# Patient Record
Sex: Female | Born: 1945 | Race: White | Hispanic: No | Marital: Single | Smoking: Never smoker
Health system: Southern US, Community
[De-identification: ages and names within clinical notes are randomized; demographics above are authoritative.]

## PROBLEM LIST (undated history)

## (undated) ENCOUNTER — Emergency Department (HOSPITAL_BASED_OUTPATIENT_CLINIC_OR_DEPARTMENT_OTHER): Payer: PRIVATE HEALTH INSURANCE | Source: Home / Self Care

## (undated) DIAGNOSIS — R569 Unspecified convulsions: Secondary | ICD-10-CM

## (undated) DIAGNOSIS — H9192 Unspecified hearing loss, left ear: Secondary | ICD-10-CM

## (undated) DIAGNOSIS — I10 Essential (primary) hypertension: Secondary | ICD-10-CM

## (undated) DIAGNOSIS — Z87442 Personal history of urinary calculi: Secondary | ICD-10-CM

## (undated) DIAGNOSIS — I809 Phlebitis and thrombophlebitis of unspecified site: Secondary | ICD-10-CM

## (undated) DIAGNOSIS — F329 Major depressive disorder, single episode, unspecified: Secondary | ICD-10-CM

## (undated) DIAGNOSIS — F32A Depression, unspecified: Secondary | ICD-10-CM

## (undated) DIAGNOSIS — E7800 Pure hypercholesterolemia, unspecified: Secondary | ICD-10-CM

## (undated) DIAGNOSIS — N200 Calculus of kidney: Secondary | ICD-10-CM

## (undated) DIAGNOSIS — F419 Anxiety disorder, unspecified: Secondary | ICD-10-CM

## (undated) DIAGNOSIS — M1990 Unspecified osteoarthritis, unspecified site: Secondary | ICD-10-CM

## (undated) DIAGNOSIS — R011 Cardiac murmur, unspecified: Secondary | ICD-10-CM

## (undated) DIAGNOSIS — K219 Gastro-esophageal reflux disease without esophagitis: Secondary | ICD-10-CM

## (undated) DIAGNOSIS — I509 Heart failure, unspecified: Secondary | ICD-10-CM

## (undated) HISTORY — PX: APPENDECTOMY: SHX54

## (undated) HISTORY — PX: COLONOSCOPY: SHX174

## (undated) HISTORY — PX: EYE SURGERY: SHX253

## (undated) HISTORY — PX: OTHER SURGICAL HISTORY: SHX169

## (undated) HISTORY — PX: CHOLECYSTECTOMY: SHX55

## (undated) HISTORY — PX: CARPAL TUNNEL RELEASE: SHX101

## (undated) HISTORY — PX: BREAST BIOPSY: SHX20

## (undated) HISTORY — PX: ESOPHAGOSCOPY: SUR460

## (undated) HISTORY — PX: INCISION AND DRAINAGE PERIRECTAL ABSCESS: SHX1804

## (undated) HISTORY — PX: ABDOMINAL HYSTERECTOMY: SHX81

---

## 2010-06-19 ENCOUNTER — Emergency Department (HOSPITAL_COMMUNITY)
Admission: EM | Admit: 2010-06-19 | Discharge: 2010-06-19 | Payer: Self-pay | Source: Home / Self Care | Admitting: Family Medicine

## 2010-07-01 ENCOUNTER — Emergency Department (HOSPITAL_COMMUNITY)
Admission: EM | Admit: 2010-07-01 | Discharge: 2010-07-01 | Payer: Self-pay | Source: Home / Self Care | Admitting: Family Medicine

## 2010-07-02 ENCOUNTER — Ambulatory Visit
Admission: RE | Admit: 2010-07-02 | Discharge: 2010-07-02 | Payer: Self-pay | Source: Home / Self Care | Attending: Emergency Medicine | Admitting: Emergency Medicine

## 2010-07-02 ENCOUNTER — Encounter (INDEPENDENT_AMBULATORY_CARE_PROVIDER_SITE_OTHER): Payer: Self-pay | Admitting: Emergency Medicine

## 2010-07-02 ENCOUNTER — Ambulatory Visit (HOSPITAL_COMMUNITY)
Admission: RE | Admit: 2010-07-02 | Discharge: 2010-07-02 | Payer: Self-pay | Source: Home / Self Care | Attending: Emergency Medicine | Admitting: Emergency Medicine

## 2010-09-23 LAB — POCT I-STAT, CHEM 8
BUN: 42 mg/dL — ABNORMAL HIGH (ref 6–23)
Creatinine, Ser: 1.7 mg/dL — ABNORMAL HIGH (ref 0.4–1.2)
Glucose, Bld: 189 mg/dL — ABNORMAL HIGH (ref 70–99)
Hemoglobin: 10.9 g/dL — ABNORMAL LOW (ref 12.0–15.0)
TCO2: 28 mmol/L (ref 0–100)

## 2010-09-23 LAB — CBC
HCT: 32.3 % — ABNORMAL LOW (ref 36.0–46.0)
Hemoglobin: 10.4 g/dL — ABNORMAL LOW (ref 12.0–15.0)
MCH: 29.3 pg (ref 26.0–34.0)
MCHC: 32.2 g/dL (ref 30.0–36.0)
MCV: 91 fL (ref 78.0–100.0)
Platelets: 305 10*3/uL (ref 150–400)
RBC: 3.55 MIL/uL — ABNORMAL LOW (ref 3.87–5.11)
RDW: 13.4 % (ref 11.5–15.5)
WBC: 13.3 10*3/uL — ABNORMAL HIGH (ref 4.0–10.5)

## 2010-09-24 LAB — POCT URINALYSIS DIPSTICK
Bilirubin Urine: NEGATIVE
Hgb urine dipstick: NEGATIVE
Nitrite: NEGATIVE
Protein, ur: NEGATIVE mg/dL
pH: 5 (ref 5.0–8.0)

## 2010-11-13 ENCOUNTER — Other Ambulatory Visit (HOSPITAL_COMMUNITY): Payer: Self-pay | Admitting: Gastroenterology

## 2010-11-13 DIAGNOSIS — R112 Nausea with vomiting, unspecified: Secondary | ICD-10-CM

## 2010-11-13 DIAGNOSIS — K3184 Gastroparesis: Secondary | ICD-10-CM

## 2010-12-13 ENCOUNTER — Ambulatory Visit (HOSPITAL_COMMUNITY)
Admission: RE | Admit: 2010-12-13 | Discharge: 2010-12-13 | Disposition: A | Discharge status: Home / Self Care | Payer: PRIVATE HEALTH INSURANCE | Source: Ambulatory Visit | Attending: Gastroenterology | Admitting: Gastroenterology

## 2010-12-13 ENCOUNTER — Encounter (HOSPITAL_COMMUNITY): Payer: Self-pay

## 2010-12-13 DIAGNOSIS — K3184 Gastroparesis: Secondary | ICD-10-CM | POA: Insufficient documentation

## 2010-12-13 DIAGNOSIS — R112 Nausea with vomiting, unspecified: Secondary | ICD-10-CM | POA: Insufficient documentation

## 2010-12-13 DIAGNOSIS — K3189 Other diseases of stomach and duodenum: Secondary | ICD-10-CM | POA: Insufficient documentation

## 2010-12-13 DIAGNOSIS — E119 Type 2 diabetes mellitus without complications: Secondary | ICD-10-CM | POA: Insufficient documentation

## 2010-12-13 MED ORDER — TECHNETIUM TC 99M SULFUR COLLOID
2.0000 | Freq: Once | INTRAVENOUS | Status: AC | PRN
  Administered 2010-12-13: 2 via INTRAVENOUS

## 2011-01-01 ENCOUNTER — Inpatient Hospital Stay (HOSPITAL_COMMUNITY)
Admission: EM | Admit: 2011-01-01 | Discharge: 2011-01-10 | DRG: 579 | Disposition: A | Discharge status: Home Health | Payer: PRIVATE HEALTH INSURANCE | Attending: Internal Medicine | Admitting: Internal Medicine

## 2011-01-01 DIAGNOSIS — Z7982 Long term (current) use of aspirin: Secondary | ICD-10-CM

## 2011-01-01 DIAGNOSIS — Z6841 Body Mass Index (BMI) 40.0 and over, adult: Secondary | ICD-10-CM

## 2011-01-01 DIAGNOSIS — F3289 Other specified depressive episodes: Secondary | ICD-10-CM | POA: Diagnosis present

## 2011-01-01 DIAGNOSIS — M76899 Other specified enthesopathies of unspecified lower limb, excluding foot: Secondary | ICD-10-CM | POA: Diagnosis present

## 2011-01-01 DIAGNOSIS — Z79899 Other long term (current) drug therapy: Secondary | ICD-10-CM

## 2011-01-01 DIAGNOSIS — J690 Pneumonitis due to inhalation of food and vomit: Secondary | ICD-10-CM | POA: Diagnosis not present

## 2011-01-01 DIAGNOSIS — N179 Acute kidney failure, unspecified: Secondary | ICD-10-CM | POA: Diagnosis not present

## 2011-01-01 DIAGNOSIS — J9600 Acute respiratory failure, unspecified whether with hypoxia or hypercapnia: Secondary | ICD-10-CM | POA: Diagnosis not present

## 2011-01-01 DIAGNOSIS — G4733 Obstructive sleep apnea (adult) (pediatric): Secondary | ICD-10-CM | POA: Diagnosis present

## 2011-01-01 DIAGNOSIS — Z794 Long term (current) use of insulin: Secondary | ICD-10-CM

## 2011-01-01 DIAGNOSIS — L02419 Cutaneous abscess of limb, unspecified: Principal | ICD-10-CM | POA: Diagnosis present

## 2011-01-01 DIAGNOSIS — E662 Morbid (severe) obesity with alveolar hypoventilation: Secondary | ICD-10-CM | POA: Diagnosis present

## 2011-01-01 DIAGNOSIS — F329 Major depressive disorder, single episode, unspecified: Secondary | ICD-10-CM | POA: Diagnosis present

## 2011-01-01 DIAGNOSIS — R609 Edema, unspecified: Secondary | ICD-10-CM | POA: Diagnosis present

## 2011-01-01 DIAGNOSIS — IMO0001 Reserved for inherently not codable concepts without codable children: Secondary | ICD-10-CM | POA: Diagnosis not present

## 2011-01-01 DIAGNOSIS — E785 Hyperlipidemia, unspecified: Secondary | ICD-10-CM | POA: Diagnosis present

## 2011-01-01 DIAGNOSIS — I10 Essential (primary) hypertension: Secondary | ICD-10-CM | POA: Diagnosis present

## 2011-01-01 LAB — CBC
HCT: 30.9 % — ABNORMAL LOW (ref 36.0–46.0)
MCHC: 32.4 g/dL (ref 30.0–36.0)
MCV: 88.3 fL (ref 78.0–100.0)
Platelets: 319 10*3/uL (ref 150–400)
RDW: 13.3 % (ref 11.5–15.5)
WBC: 12.6 10*3/uL — ABNORMAL HIGH (ref 4.0–10.5)

## 2011-01-01 LAB — DIFFERENTIAL
Basophils Absolute: 0 10*3/uL (ref 0.0–0.1)
Eosinophils Absolute: 0.5 10*3/uL (ref 0.0–0.7)
Eosinophils Relative: 4 % (ref 0–5)
Lymphocytes Relative: 21 % (ref 12–46)
Lymphs Abs: 2.6 10*3/uL (ref 0.7–4.0)
Monocytes Absolute: 0.8 10*3/uL (ref 0.1–1.0)

## 2011-01-01 LAB — BASIC METABOLIC PANEL
BUN: 22 mg/dL (ref 6–23)
Creatinine, Ser: 0.7 mg/dL (ref 0.50–1.10)
GFR calc Af Amer: >60 mL/min (ref >60)
GFR calc non Af Amer: >60 mL/min (ref >60)

## 2011-01-01 LAB — GLUCOSE, CAPILLARY: Glucose-Capillary: 162 mg/dL — ABNORMAL HIGH (ref 70–99)

## 2011-01-02 ENCOUNTER — Inpatient Hospital Stay (HOSPITAL_COMMUNITY): Payer: PRIVATE HEALTH INSURANCE

## 2011-01-02 LAB — GLUCOSE, CAPILLARY
Glucose-Capillary: 213 mg/dL — ABNORMAL HIGH (ref 70–99)
Glucose-Capillary: 208 mg/dL — ABNORMAL HIGH (ref 70–99)

## 2011-01-02 MED ORDER — IOHEXOL 300 MG/ML  SOLN
100.0000 mL | Freq: Once | INTRAMUSCULAR | Status: AC | PRN
  Administered 2011-01-02: 100 mL via INTRAVENOUS

## 2011-01-03 ENCOUNTER — Inpatient Hospital Stay (HOSPITAL_COMMUNITY): Payer: PRIVATE HEALTH INSURANCE

## 2011-01-03 DIAGNOSIS — M79609 Pain in unspecified limb: Secondary | ICD-10-CM

## 2011-01-03 LAB — GLUCOSE, CAPILLARY
Glucose-Capillary: 132 mg/dL — ABNORMAL HIGH (ref 70–99)
Glucose-Capillary: 155 mg/dL — ABNORMAL HIGH (ref 70–99)
Glucose-Capillary: 97 mg/dL (ref 70–99)
Glucose-Capillary: 124 mg/dL — ABNORMAL HIGH (ref 70–99)

## 2011-01-03 LAB — CBC
HCT: 28.6 % — ABNORMAL LOW (ref 36.0–46.0)
MCH: 28.6 pg (ref 26.0–34.0)
MCHC: 32.2 g/dL (ref 30.0–36.0)
MCV: 88.8 fL (ref 78.0–100.0)
RDW: 13.4 % (ref 11.5–15.5)

## 2011-01-03 LAB — SURGICAL PCR SCREEN
MRSA, PCR: NEGATIVE
Staphylococcus aureus: NEGATIVE

## 2011-01-03 LAB — BASIC METABOLIC PANEL
BUN: 21 mg/dL (ref 6–23)
Calcium: 8.6 mg/dL (ref 8.4–10.5)
Chloride: 100 mEq/L (ref 96–112)
Creatinine, Ser: 1.06 mg/dL (ref 0.50–1.10)
GFR calc Af Amer: >60 mL/min (ref >60)
GFR calc non Af Amer: 52 mL/min — ABNORMAL LOW (ref >60)

## 2011-01-03 LAB — GRAM STAIN

## 2011-01-04 LAB — GLUCOSE, CAPILLARY
Glucose-Capillary: 111 mg/dL — ABNORMAL HIGH (ref 70–99)
Glucose-Capillary: 129 mg/dL — ABNORMAL HIGH (ref 70–99)
Glucose-Capillary: 129 mg/dL — ABNORMAL HIGH (ref 70–99)
Glucose-Capillary: 127 mg/dL — ABNORMAL HIGH (ref 70–99)

## 2011-01-04 LAB — CBC
Platelets: 302 10*3/uL (ref 150–400)
RBC: 2.91 MIL/uL — ABNORMAL LOW (ref 3.87–5.11)
RDW: 13.6 % (ref 11.5–15.5)
WBC: 13.8 10*3/uL — ABNORMAL HIGH (ref 4.0–10.5)

## 2011-01-04 LAB — BASIC METABOLIC PANEL
CO2: 27 mEq/L (ref 19–32)
Chloride: 100 mEq/L (ref 96–112)
GFR calc Af Amer: 39 mL/min — ABNORMAL LOW (ref >60)
Potassium: 4.3 mEq/L (ref 3.5–5.1)
Sodium: 138 mEq/L (ref 135–145)

## 2011-01-05 ENCOUNTER — Inpatient Hospital Stay (HOSPITAL_COMMUNITY): Payer: PRIVATE HEALTH INSURANCE

## 2011-01-05 LAB — CBC
HCT: 27.1 % — ABNORMAL LOW (ref 36.0–46.0)
MCV: 90.3 fL (ref 78.0–100.0)
Platelets: 279 10*3/uL (ref 150–400)
RBC: 3 MIL/uL — ABNORMAL LOW (ref 3.87–5.11)
RDW: 14 % (ref 11.5–15.5)
WBC: 9.6 10*3/uL (ref 4.0–10.5)

## 2011-01-05 LAB — CREATININE, URINE, RANDOM: Creatinine, Urine: 77.71 mg/dL

## 2011-01-05 LAB — GLUCOSE, CAPILLARY
Glucose-Capillary: 120 mg/dL — ABNORMAL HIGH (ref 70–99)
Glucose-Capillary: 124 mg/dL — ABNORMAL HIGH (ref 70–99)

## 2011-01-05 LAB — BASIC METABOLIC PANEL
CO2: 25 mEq/L (ref 19–32)
Chloride: 100 mEq/L (ref 96–112)
Creatinine, Ser: 2.54 mg/dL — ABNORMAL HIGH (ref 0.50–1.10)
GFR calc Af Amer: 23 mL/min — ABNORMAL LOW (ref >60)
Potassium: 4.6 mEq/L (ref 3.5–5.1)

## 2011-01-06 DIAGNOSIS — L03119 Cellulitis of unspecified part of limb: Secondary | ICD-10-CM

## 2011-01-06 DIAGNOSIS — L02419 Cutaneous abscess of limb, unspecified: Secondary | ICD-10-CM

## 2011-01-06 LAB — CBC
Hemoglobin: 8.4 g/dL — ABNORMAL LOW (ref 12.0–15.0)
MCHC: 31.2 g/dL (ref 30.0–36.0)
RBC: 2.99 MIL/uL — ABNORMAL LOW (ref 3.87–5.11)
WBC: 10.1 10*3/uL (ref 4.0–10.5)

## 2011-01-06 LAB — BASIC METABOLIC PANEL
CO2: 22 mEq/L (ref 19–32)
Chloride: 103 mEq/L (ref 96–112)
GFR calc non Af Amer: 28 mL/min — ABNORMAL LOW (ref >60)
Glucose, Bld: 150 mg/dL — ABNORMAL HIGH (ref 70–99)
Potassium: 4.1 mEq/L (ref 3.5–5.1)
Sodium: 138 mEq/L (ref 135–145)

## 2011-01-06 LAB — GLUCOSE, CAPILLARY
Glucose-Capillary: 171 mg/dL — ABNORMAL HIGH (ref 70–99)
Glucose-Capillary: 159 mg/dL — ABNORMAL HIGH (ref 70–99)
Glucose-Capillary: 156 mg/dL — ABNORMAL HIGH (ref 70–99)

## 2011-01-06 LAB — BODY FLUID CULTURE

## 2011-01-06 NOTE — H&P (Signed)
NAMEMarland Kitchen  SACRED, ROA NO.:  [id number]  MEDICAL RECORD NO.:  [id number]  LOCATION:  [facility]                         FACILITY:  [facility]  PHYSICIAN:  Jeoffrey Massed, MD    DATE OF BIRTH:  1945-12-15  DATE OF ADMISSION:  01/01/2011 DATE OF DISCHARGE:                             HISTORY & PHYSICAL   PRIMARY CARE PRACTITIONER:  Gretta Arab. Valentina Lucks, MD  CHIEF COMPLAINT:  Left lower extremity pain and swelling.  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 Per the patient last Friday, her legs started to swell and over the weekend, it progressed to some mild erythema.  However, for the last few days the erythema has worsened and started to hurt.  She has some subjective fever as well, but there is no documented fever.  She then presented to the ED for further evaluation.  She was in the ED she was found to have cellulitis with a leukocytosis of 12.6 and was admitted to the hospitalist service for the evaluation and treatment.  The patient denies any chest pain or shortness of breath.  The patient denies any headache, nausea, vomiting, or diarrhea.  Denies any abdominal pain.  ALLERGIES:  The patient is allergic to LIPITOR and INDOMETHACIN.  PAST MEDICAL HISTORY: 1. Diabetes. 2. Hypertension. 3. Dyslipidemia. 4. Chronic left lower extremity swelling. 5. Depression. 6. Obesity.  PAST SURGICAL HISTORY: 1. Appendectomy. 2. Cholecystectomy. 3. Bilateral carpal tunnel surgeries.  MEDICATIONS AT HOME:  Include the following; 1. Dexilant 60 mg 1 tablet daily. 2. Lantus 50 units twice a day. 3. Norco 5/325 one tablet p.o. daily p.r.n. 4. Lisinopril/hydrochlorothiazide 20/12.5 one tablet p.o. daily. 5. Norvasc 5 mg 1 tablet daily. 6. NovoLog insulin sliding scale. 7. Phenergan 25 mg 1 tablet p.o. q.6  hours. p.r.n. 8. Lasix 40 mg 1 tablet daily. 9. Paroxetine 40 mg 1 tablet daily. 10.Simvastatin 40 mg 1 tablet daily. 11.Meloxicam 15 mg 1 tablet daily. 12.Calcium plus vitamin D 600-200 mg 1 tablet p.o. daily. 13.Centrum Silver daily. 14.Loratadine 10 mg 1 tablet daily. 15.Baby aspirin 81 mg 1 tablet daily. 16.Iron 325 mg 1 tablet twice daily. 17.Neurontin 300 mg 1 capsule three times a day. 18.Albuterol inhaler 2 puffs inhaled p.r.n. 19.Mucinex 30/600 mg p.o. daily. 20.Advil 400 mg p.o. daily p.r.n.  FAMILY HISTORY:  Noncontributory to current condition.  SOCIAL HISTORY:  Denies any toxic habits.  Lives with family.  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  REVIEW OF SYSTEMS:  Detailed review of 12 systems were done and these are negative except for the ones mentioned in the HPI.  PHYSICAL EXAM:  GENERAL:  Lying in bed, does not appear to be in distress.  Does not have a toxic appearance. VITAL SIGNS:  Shows a temperature of 97.9, heart rate of 84, blood pressure 131/66 and pulse ox of 93% on room air. HEENT:  Atraumatic, normocephalic.  Pupils are equally react light and accommodation. NECK:  Supple. CHEST:  Bilaterally clear to auscultation. CARDIOVASCULAR:  Heart sounds are regular.  No murmurs  heard. ABDOMEN:  Obese, soft, nontender, nondistended.  She has a cholecystectomy scar on the right upper quadrant region with some slight excoriation of the skin. EXTREMITIES:  Her left lower extremity is erythematous anteriorly and laterally.  It is tender to touch.  It is slightly more swollen than her right lower extremity.  Both of her legs are warm to touch.  She has good pedal pulses symmetrically and has good capillary refill.  Her sensation is grossly intact in the lower extremities. NEUROLOGIC:  The patient is awake and alert and does not have any focal neurological deficits.  LABORATORY DATA: 1. CBC showed a WBC of 12.6, hemoglobin of 10.0,  hematocrit of 30.9     and a platelet count of 319. 2. Chemistry shows sodium of 138, potassium of 4.2, chloride of 100,     bicarb of 31, BUN of 22, creatinine of 0.70 and a calcium 9.5.  RADIOLOGICAL STUDIES:  None so far.  ASSESSMENT: 1. Left lower extremity cellulitis. 2. Diabetes, currently controlled. 3. Hypertension, currently controlled. 4. History of dyslipidemia. 5. History morbid obesity.  PLAN: 1. This patient will be admitted to regular medical floor. 2. She works as an Charity fundraiser and as a result we will start on IV vancomycin. 3. The patient has been instructed to keep her left lower extremity     elevated at all times. 4. We will ask the RN to mark the cellulitic area. 5. Further plan will depend as the patient's clinical course evolves. 6. In regard to her diabetes, we will continue Lantus and we will put     on a sliding scale insulin. 7. Her usual blood pressure medications will be continued. 8. DVT prophylaxis with Lovenox. 9. Code status, full code.  Total time spent coordinating admission 60 minutes.     Jeoffrey Massed, MD     SG/MEDQ  D:  01/01/2011  T:  01/01/2011  Job:  [id number]  cc:   Gretta Arab. Valentina Lucks, M.D. Electronically Signed by Jeoffrey Massed  on 01/06/2011 04:33:36 PM

## 2011-01-07 ENCOUNTER — Inpatient Hospital Stay (HOSPITAL_COMMUNITY): Payer: PRIVATE HEALTH INSURANCE

## 2011-01-07 LAB — BASIC METABOLIC PANEL
BUN: 31 mg/dL — ABNORMAL HIGH (ref 6–23)
Creatinine, Ser: 1.11 mg/dL — ABNORMAL HIGH (ref 0.50–1.10)
GFR calc Af Amer: >60 mL/min (ref >60)
GFR calc non Af Amer: 50 mL/min — ABNORMAL LOW (ref >60)
Potassium: 3.9 mEq/L (ref 3.5–5.1)

## 2011-01-07 LAB — GLUCOSE, CAPILLARY
Glucose-Capillary: 176 mg/dL — ABNORMAL HIGH (ref 70–99)
Glucose-Capillary: 159 mg/dL — ABNORMAL HIGH (ref 70–99)

## 2011-01-07 LAB — CBC
HCT: 26.2 % — ABNORMAL LOW (ref 36.0–46.0)
MCHC: 31.3 g/dL (ref 30.0–36.0)
Platelets: 291 10*3/uL (ref 150–400)
RDW: 13.7 % (ref 11.5–15.5)

## 2011-01-07 MED ORDER — IOHEXOL 300 MG/ML  SOLN
100.0000 mL | Freq: Once | INTRAMUSCULAR | Status: AC | PRN
  Administered 2011-01-07: 100 mL via INTRAVENOUS

## 2011-01-08 LAB — CBC
HCT: 23.9 % — ABNORMAL LOW (ref 36.0–46.0)
MCV: 88.8 fL (ref 78.0–100.0)
Platelets: 296 10*3/uL (ref 150–400)
RBC: 2.69 MIL/uL — ABNORMAL LOW (ref 3.87–5.11)
WBC: 8.4 10*3/uL (ref 4.0–10.5)

## 2011-01-08 LAB — BASIC METABOLIC PANEL
BUN: 17 mg/dL (ref 6–23)
CO2: 26 mEq/L (ref 19–32)
Chloride: 105 mEq/L (ref 96–112)
Creatinine, Ser: 0.79 mg/dL (ref 0.50–1.10)

## 2011-01-08 LAB — DIFFERENTIAL
Lymphocytes Relative: 18 % (ref 12–46)
Lymphs Abs: 1.5 10*3/uL (ref 0.7–4.0)
Neutro Abs: 5.6 10*3/uL (ref 1.7–7.7)
Neutrophils Relative %: 67 % (ref 43–77)

## 2011-01-08 LAB — GLUCOSE, CAPILLARY: Glucose-Capillary: 122 mg/dL — ABNORMAL HIGH (ref 70–99)

## 2011-01-08 LAB — ANAEROBIC CULTURE

## 2011-01-09 ENCOUNTER — Inpatient Hospital Stay (HOSPITAL_COMMUNITY): Payer: PRIVATE HEALTH INSURANCE

## 2011-01-09 LAB — BASIC METABOLIC PANEL
CO2: 28 mEq/L (ref 19–32)
Chloride: 104 mEq/L (ref 96–112)
Creatinine, Ser: 0.77 mg/dL (ref 0.50–1.10)
Potassium: 3.7 mEq/L (ref 3.5–5.1)

## 2011-01-09 LAB — GLUCOSE, CAPILLARY

## 2011-01-09 LAB — DIFFERENTIAL
Eosinophils Absolute: 0.5 10*3/uL (ref 0.0–0.7)
Lymphocytes Relative: 18 % (ref 12–46)
Lymphs Abs: 1.6 10*3/uL (ref 0.7–4.0)
Neutrophils Relative %: 67 % (ref 43–77)

## 2011-01-09 LAB — CBC
MCV: 88.8 fL (ref 78.0–100.0)
Platelets: 323 10*3/uL (ref 150–400)
RBC: 2.76 MIL/uL — ABNORMAL LOW (ref 3.87–5.11)
WBC: 9 10*3/uL (ref 4.0–10.5)

## 2011-01-10 LAB — BASIC METABOLIC PANEL
BUN: 16 mg/dL (ref 6–23)
CO2: 29 mEq/L (ref 19–32)
Calcium: 8.9 mg/dL (ref 8.4–10.5)
Chloride: 103 mEq/L (ref 96–112)
Creatinine, Ser: 0.97 mg/dL (ref 0.50–1.10)
Glucose, Bld: 145 mg/dL — ABNORMAL HIGH (ref 70–99)

## 2011-01-10 LAB — DIFFERENTIAL
Lymphocytes Relative: 19 % (ref 12–46)
Lymphs Abs: 2.2 10*3/uL (ref 0.7–4.0)
Monocytes Relative: 9 % (ref 3–12)
Neutrophils Relative %: 66 % (ref 43–77)

## 2011-01-10 LAB — CBC
HCT: 24.2 % — ABNORMAL LOW (ref 36.0–46.0)
MCH: 30.2 pg (ref 26.0–34.0)
MCV: 90.3 fL (ref 78.0–100.0)
RBC: 2.68 MIL/uL — ABNORMAL LOW (ref 3.87–5.11)
WBC: 11.3 10*3/uL — ABNORMAL HIGH (ref 4.0–10.5)

## 2011-01-10 LAB — GLUCOSE, CAPILLARY: Glucose-Capillary: 159 mg/dL — ABNORMAL HIGH (ref 70–99)

## 2011-01-11 NOTE — Consult Note (Signed)
NAMEBERDIA, LACHMAN NO.:  [id number]  MEDICAL RECORD NO.:  [id number]  LOCATION:  5022                         FACILITY:  [facility]  PHYSICIAN:  Jene Every, M.D.    DATE OF BIRTH:  [birth year]  DATE OF CONSULTATION:  01/03/2011 DATE OF DISCHARGE:                                CONSULTATION   CHIEF COMPLAINT:  Abscess, left leg and cellulitis.  BRIEF HISTORY:  This is a 65 year old female who was admitted on the 20th to the medical service of Dr. Maurice Small, with left lower extremity pain and swelling.  She has a history of insulin-dependent diabetes, hypertension.  She was noted to have leukocytosis of 12.6. She was found to have cellulitis, placed on IV antibiotics which included clindamycin, vancomycin and admitted.  She subsequently had a CT scan of her leg, which showed a pretibial tubercle abscess measuring approximately 3 x 3 x 1.5 cm.  She had cellulitis and with that abscess, I was consulted for I and D of the suspected abscess.  No evidence of intra-articular phenomenon.  She has had currently no fevers, but extreme pain.  She had an area of cellulitis and has somewhat decreased since her admission.  PAST MEDICAL HISTORY:  Significant for: 1. Diabetes. 2. Hypertension. 3. Dyslipidemia. 4. Depression. 5. Obesity.  PAST SURGICAL HISTORY:  Appendectomy, cholecystectomy.  MEDICATIONS:  Lantus, Norco, HCTZ/lisinopril, Norvasc, NovoLog, Phenergan, Lasix, paroxetine, simvastatin, meloxicam, Neurontin, albuterol, and Advil.  SOCIAL HISTORY:  Lives with family.  Works as a Designer, jewellery at [employer].  PHYSICAL EXAMINATION:  GENERAL:  Healthy.  No distress.  Mood and affect is appropriate. VITAL SIGNS:  Temperature 98.4, BP 134/45, pulse 70, respirations 18. LEFT LOWER EXTREMITY:  She has cellulitis from the mid tibia distal. Indurated with erythema and basically on the anterior, medial and lateral aspect of the pretibial region.  Exclusively  tender to palpation of the tibial tubercle where there is some slight prominence noted. Compartments are soft.  There is no effusion of the knees.  We are able to range the knee without pain.  Also, able to range the ankle without pain.  Good pulses are noted.  There is no upper extremity involvement.  On the right calf, there is a small abrasion noted.  She is able to dorsiflex and plantar flex with the left foot.  Chest x-ray, there is no active disease.  LABORATORY DATA:  Glucose 155.  MRSA test was negative.  Her white count on June 22 is 12.7.  Sodium 137, potassium 4.3, BUN 21, creatinine 1.06.  CT scan of the tibia demonstrates cellulitis of the pretibial area and a focal soft tissue abscess with infected prepatellar bursa.  This is anterior to the tibial tubercle and inferior aspect of the patella ligament.  IMPRESSION:  Abscess of the subcutaneous tissues, anterior to the tibial tubercle and distal patellar tendon with associated soft tissue cellulitis.  The patient currently on vancomycin, clindamycin; insulin- dependent diabetic.  PLAN:  Discuss options of the area of focal abscess.  We will perform a local I and D.  Make a small incision over the tibial tubercle, blunt dissection to that and drain the abscess.  Obtain cultures, aerobic and anaerobic.  Most likely, pack the wound open.  Risks and benefits of the bleeding, infection, need for further revision, DVT, PE, anesthetic complications, etc.     Jene Every, M.D.     Cordelia Pen  D:  01/03/2011  T:  01/04/2011  Job:  [id number]  Electronically Signed by Jene Every M.D. on 01/11/2011 09:06:02 AM

## 2011-01-11 NOTE — Op Note (Signed)
  NAMESHAVELL, NORED NO.:  [id number]  MEDICAL RECORD NO.:  [id number]  LOCATION:  5022                         FACILITY:  [facility]  PHYSICIAN:  Jene Every, M.D.    DATE OF BIRTH:  [birth year]  DATE OF PROCEDURE:  01/03/2011 DATE OF DISCHARGE:                              OPERATIVE REPORT   PREOPERATIVE DIAGNOSIS:  Pretibial abscess of the left leg.  POSTOPERATIVE DIAGNOSIS:  Pretibial abscess of the left leg.  PROCEDURES PERFORMED: 1. Irrigation and debridement, open of a pretibial abscess of the left     knee with excision of a pretibial bursa. 2. Insertion of Hemovac drain with closure over the Hemovac drain.  ANESTHESIA:  General.  ASSISTANT:  None.  BRIEF HISTORY:  This is a 65 year old female who was admitted with a cellulitis of the left leg with a CT scan indicating an abscess over the anterior aspect of the tibial tubercle just beneath the patellar ligament.  She had been placed on IV antibiotics and due to the presence of an abscess, she was indicated for I and D.  We discussed the risks and benefits including bleeding, infection, need for repeat I and D, need for open wound, etc., DVT, PE, anesthetic complications, etc.  TECHNIQUE:  The patient was placed in supine position.  After induction of adequate general esthesia, left lower extremity was prepped and draped in the usual sterile fashion.  Again, she had been on IV vancomycin and clindamycin.  Incision was made approximately 3 cm in length vertically over the tibial tubercle.  Subcutaneous tissue was dissected, electrocautery was utilized to achieve hemostasis, good bleeding tissue was noted.  Just anterior to the tibial tubercle, there was an abscess encountered with purulence expressed.  Aerobic, anaerobic cultures and Gram stain were obtained.  The area was copiously irrigated with saline.  We excised pretibial tubercle bursa and scar tissue. There was a small pocket measuring 2 x 2  cm.  We digitally palpated, it did not extend distally, proximally, anteriorly, or posteriorly.  There was no extension into the joint and the compartments were soft.  This after being copiously irrigated with electrocautery utilized to achieve hemostasis, I then placed a Hemovac deep into the wound anterior to the tibial tubercle and placed it through a lateral stab wound in the skin. I then closed the wound loosely over that with 2-0 nylon interrupted simple sutures.  Compression dressing was placed over the wound.  With Adaptic, 4 x 4s and secured with ABD and an ACE bandage.  The patient was then extubated without difficulty and transported to the recovery room in satisfactory condition.  The patient tolerated the procedure well.  No complications.  No assistant.  SPECIMEN:  Abscess fluid for aerobic, anaerobic cultures and Gram stain.     Jene Every, M.D.     Cordelia Pen  D:  01/03/2011  T:  01/04/2011  Job:  [id number]  Electronically Signed by Jene Every M.D. on 01/11/2011 09:06:04 AM

## 2011-01-14 LAB — CULTURE, BLOOD (ROUTINE X 2)
Culture  Setup Time: 201206270242
Culture: NO GROWTH

## 2011-01-16 NOTE — Discharge Summary (Signed)
NAMEBRAYLON, Sharon [name] NO.:  [id number]  MEDICAL RECORD NO.:  [id number]  LOCATION:  5022                         FACILITY:  [facility]  PHYSICIAN:  Thad Ranger, MD       DATE OF BIRTH:  09-11-1945  DATE OF ADMISSION:  01/01/2011 DATE OF DISCHARGE:                        DISCHARGE SUMMARY - REFERRING   PRIMARY CARE PHYSICIAN:  Gretta Arab. Valentina Lucks, M.D.  DISCHARGE DIAGNOSES: 1. Left lower extremity cellulitis. 2. Pretibial abscess/septic joint, status post irrigation and     debridement with opening of pretibial abscess with excision of     pretibial bursa. 3. Diabetes. 4. Hypertension. 5. Dyslipidemia. 6. Morbid obesity. 7. Depression. 8. Obstructive sleep apnea. 9. Acute renal failure.  CONSULTATIONS:  Orthopedics, Dr. Jene Every. Infectious Disease, Dr. Johny Sax.  DISCHARGE MEDICATIONS: 1. Ancef 1 g IV twice daily for 17 days. 2. Percocet 5/325 mg 1-3 tablets p.o. every 4 hours as needed for     pain. 3. Robaxin 750 mg p.o. t.i.d. 4. Ventolin inhaler 2 puffs inhaled t.i.d. 5. Aspirin 81 mg daily. 6. Calcium carbonate/vitamin D one tablet p.o. daily. 7. Dexilant 60 mg p.o. q.a.m. 8. Ferrous sulfate 325 mg p.o. b.i.d. 9. Fish oil 1000 mg 1 capsule p.o. daily. 10.Furosemide 40 mg p.o. daily. 11.Lantus 50 units subcu b.i.d. 12.Lisinopril/hydrochlorothiazide 20/12.5 mg one tablet p.o. daily. 13.Loratadine 10 mg p.o. daily. 14.Meloxicam 15 mg p.o. daily. 15.Multivitamin one tablet p.o. daily. 16.Neurontin 300 mg t.i.d. 17.Norvasc 5 mg p.o. daily. 18.NovoLog aspart per home schedule. 19.Paroxetine 40 mg p.o. daily. 20.Phenergan 25 mg p.o. every 6 hours as needed for nausea/vomiting. 21.Simvastatin 40 mg p.o. daily.  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  Per Sharon  [name], in Sharon last few days, Sharon [name]'s legs had started to swell and over Sharon weekend it progressed to mild erythema. For next few days, erythema worsened and started to hurt and Sharon [name] did not document her fevers, although she said she had some subjective fevers.  Sharon [name] was found to have cellulitis with leukocytosis of 12.6 and was admitted to hospitalist service.  RADIOLOGICAL DATA:  CT scan of left tibia-fibula showed cellulitis with focal soft tissue abscess 3.8 x 3.3 x 1.9 cm or infected prepatellar bursitis.  Chest x-ray two-view on June 22; mild cardiomegaly with mild peribronchial thickening.  No definitive active process.  Renal ultrasound on June 24 showed no evidence of hydronephrosis.  PERTINENT LABORATORY AND DIAGNOSTIC DATA:  CBC at Sharon time of admission; white count 12.6, hemoglobin 10.0, hematocrit 30.9, platelets 319. BMET, unremarkable.  MRSA PCR screening was negative.  Abscess culture showed MSSA Staphylococcus.  During Sharon hospitalization, Sharon [name] did develop acute renal insufficiency with creatinine plateaued at 2.54.  At Sharon time of discharge; sodium 140, potassium 3.9, BUN 31, creatinine 1.1.  BRIEF HOSPITALIZATION COURSE:  Sharon [name] is a 65 year old female with multiple medical problems as listed above who presented with left lower extremity erythema, swelling, and cellulitis. 1. Left lower extremity cellulitis with pretibial abscess and infected  bursitis.  Sharon [name] was admitted to Sharon medical floor.  She was     started on IV vancomycin and Zosyn.  CT of Sharon left lower extremity     was obtained which also showed focal soft tissue abscess or     infected prepatellar bursitis measuring 3.8 x 3.3 x 1.8 cm.     Orthopedics was consulted, and Sharon [name] was graciously seen by     Dr. Jene Every.  She underwent irrigation and debridement and     opening of Sharon pretibial abscess of Sharon left knee with excision of     Sharon pretibial  bursa.  Initially, Hemovac drain was placed which was     discontinued on January 05, 2011.  Cultures showed MSSA Staph aureus.     Infectious Disease was consulted, and Sharon [name] was seen by Dr.     Johny Sax who recommended 17 days of IV Ancef for total of 21     days. 2. Acute renal failure.  During Sharon hospitalization, Sharon [name]     developed acute renal failure as well with Sharon creatinine plateaued     at 2.5.  Renal ultrasound was obtained which was negative for any     obstruction or hydronephrosis.  Sharon [name] was on Lasix,     hydrochlorothiazide, ACE inhibitor, Mobic which were discontinued.     It is possible that Sharon acute renal insufficiency also contributed     from vancomycin.  At Sharon time of Sharon discharge, Sharon creatinine     function has normalized to 1.11.  Vancomycin has been discontinued,     and Sharon [name] is currently on Ancef.  Given that Sharon creatinine     function has normalized, Sharon [name] can restart her     antihypertensives.  She should have repeat metabolic panel with her     primary care physician within this week for Sharon followup.  Sharon     [name] will also follow up with orthopedics in 2 weeks postop.  DISPOSITION:  Sharon [name] was also seen by physical therapy and recommended home health PT/OT which was arranged.  Per orthopedic, she is okay for weightbearing on left knee gentle, but no scooting or kneeling.  Sharon [name] will follow up with orthopedics, Dr. Shelle Iron in 2 weeks.  VITAL SIGNS AT Sharon TIME OF Sharon DICTATION:  Temperature 98.1, pulse 71, respirations 20, blood pressure 121/58, O2 sats 96% on 2 L.  Discharge time 35 minutes.     Thad Ranger, MD     RR/MEDQ  D:  01/07/2011  T:  01/07/2011  Job:  [id number]  cc:   Gretta Arab. Valentina Lucks, M.D. Jene Every, M.D.  Electronically Signed by RIPUDEEP RAI  on 01/16/2011 02:02:15 PM

## 2011-01-21 ENCOUNTER — Emergency Department (HOSPITAL_COMMUNITY)
Admission: EM | Admit: 2011-01-21 | Discharge: 2011-01-22 | Disposition: A | Discharge status: Home / Self Care | Payer: PRIVATE HEALTH INSURANCE | Attending: Emergency Medicine | Admitting: Emergency Medicine

## 2011-01-21 DIAGNOSIS — E119 Type 2 diabetes mellitus without complications: Secondary | ICD-10-CM | POA: Insufficient documentation

## 2011-01-21 DIAGNOSIS — Z90710 Acquired absence of both cervix and uterus: Secondary | ICD-10-CM | POA: Insufficient documentation

## 2011-01-21 DIAGNOSIS — Y838 Other surgical procedures as the cause of abnormal reaction of the patient, or of later complication, without mention of misadventure at the time of the procedure: Secondary | ICD-10-CM | POA: Insufficient documentation

## 2011-01-21 DIAGNOSIS — M129 Arthropathy, unspecified: Secondary | ICD-10-CM | POA: Insufficient documentation

## 2011-01-21 DIAGNOSIS — Z9089 Acquired absence of other organs: Secondary | ICD-10-CM | POA: Insufficient documentation

## 2011-01-21 DIAGNOSIS — T82598A Other mechanical complication of other cardiac and vascular devices and implants, initial encounter: Secondary | ICD-10-CM | POA: Insufficient documentation

## 2011-01-21 DIAGNOSIS — I10 Essential (primary) hypertension: Secondary | ICD-10-CM | POA: Insufficient documentation

## 2011-01-21 DIAGNOSIS — Z79899 Other long term (current) drug therapy: Secondary | ICD-10-CM | POA: Insufficient documentation

## 2011-01-21 DIAGNOSIS — K219 Gastro-esophageal reflux disease without esophagitis: Secondary | ICD-10-CM | POA: Insufficient documentation

## 2011-01-30 NOTE — Discharge Summary (Signed)
Sharon [name], Sharon [name] NO.:  [id number]  MEDICAL RECORD NO.:  [id number]  LOCATION:  5022                         FACILITY:  [facility]  PHYSICIAN:  Altha Harm, MDDATE OF BIRTH:  1946-02-06  DATE OF ADMISSION:  01/01/2011 DATE OF DISCHARGE:  01/10/2011                              DISCHARGE SUMMARY   DISCHARGE DISPOSITION:  Home with home health nursing.  FINAL DISCHARGE DIAGNOSES: 1. Left pretibial abscess and septic knee.  Status post irrigation and     debridement with opening of pretibial abscess and excision of     pretibial bursa. 2. Left lower extremity cellulitis, improved. 3. Hospital-acquired pneumonia. 4. Acute on chronic hypoxic respiratory failure. 5. Diabetes type 2. 6. Hypertension. 7. Dyslipidemia. 8. Obesity, body mass index greater than 40. 9. Depression. 10.Obstructive sleep apnea - on bilevel positive airway pressure. 11.Acute renal failure, resolved.  DISCHARGE MEDICATIONS: 1. Invanz 1 g IV daily 214 days. 2. Percocet 5/325 one to two tablets p.o. q.4 h. p.r.n. pain. 3. Robaxin 750 mg p.o. t.i.d. for pain. 4. Albuterol inhaler 2 puffs inhaled t.i.d. 5. Aspirin 81 mg p.o. daily. 6. Calcium carbonate with vitamin D 1 tablet p.o. daily. 7. Dexilant 60 mg p.o. daily. 8. Iron sulfate 325 mg p.o. daily. 9. Fish oil 1000 mg p.o. daily. 10.Lasix 40 mg p.o. daily. 11.Lantus via SoloSTAR pen 50 units subcutaneously b.i.d. 12.Lisinopril/hydrochlorothiazide 20/12.5 mg p.o. daily. 13.Zyrtec 10 mg p.o. daily. 14.Meloxicam 15 mg p.o. daily. 15.Multivitamin 1 tablet p.o. daily. 16.Neurontin 300 mg p.o. t.i.d. 17.Norvasc 5 mg p.o. daily. 18.NovoLog aspart on a sliding scale unit per home schedule. 19.Paxil 40 mg p.o. daily. 20.Phenergan 25 mg p.o. q.6 h. as needed for nausea and vomiting. 21.Simvastatin 40 mg p.o. daily.  CONSULTANTS: 1. Jene Every, MD, Orthopedics. 2. Lacretia Leigh. Ninetta Lights, MD, Infectious Diseases.  PROCEDURES: 1.  Irrigation and debridement, open of a pretibial abscess of the left     knee with excision of the pretibial bursa. 2. Insertion of Hemovac with closure of Hemovac drain. 3. Midline insertion.  DIAGNOSTIC STUDIES: 1. CT of the tibia and fibula on the left lower extremity which shows     cellulitis and focal soft tissue abscess or infected prepatellar     bursitis. 2. Two-view chest x-ray which shows mild cardiomegaly and mild     peribronchial thickening.  No definite active process. 3. Renal ultrasound which shows no evidence of hydronephrosis. 4. CT angiogram of the chest which shows no large or central pulmonary     emboli.  There are patchy bilateral ground-glass airspace opacities     with small effusions.  Cannot exclude pneumonia. 5. Chest x-ray of January 01, 2011, which shows progression of diffuse     bilateral airspace disease and small effusions.  We would favor     this due to fluid overload and pulmonary edema, however, pneumonia     to be present.  PRIMARY CARE PHYSICIAN:  Gretta Arab. Valentina Lucks, MD  PRIMARY CARDIOLOGIST:  Armanda Magic, MD  CODE STATUS:  Full code.  ALLERGIES: 1. INDOMETHACIN 2. LIPITOR. 3. MORPHINE.  CHIEF COMPLAINT:  Left lower extremity pain and swelling.  HISTORY OF PRESENT ILLNESS:  Please refer to the H and P by Dr. Jerral Ralph for details of the HPI.  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  According to the patient, in the few days prior to her presentation, she has started to see her legs well and progressed to mild erythema.  Over the next few days, the erythema worsened and she began to have pain.  The patient states she had tactile fevers, but did not document her fevers.  The patient was found to have leukocytosis of 12.6 and findings of the cellulitis.   She is referred to [facility] for further evaluation and treatment.  HOSPITAL COURSE: 1. Left lower extremity cellulitis, pretibial abscess, and infected     bursa.  The patient was admitted to medical floor.  She was started     on broad-spectrum antibiotics with IV vancomycin and Zosyn.  CT of     the lower extremity was obtained and showed focal soft tissue     abscess versus an infected bursa  Dr. Shelle Iron from Orthopedics was     consulted.  She underwent irrigation and debridement and opening of     the pretibial abscess with excision of the pretibial bursa.  The     culture was sent from the knee which revealed methicillin-sensitive     Staph aureus.  Infectious Diseases was consulted and recommended     Ancef for treatment.  The patient was on Ancef with the recommended     total 21 days of therapy.  However, during her hospitalization, she     developed pneumonia and the Ancef was changed to Zosyn.     Recommendations from Dr. Ninetta Lights is that the patient continue on     Invanz for an additional 14 days beyond discharge. 2. Hospital-acquired pneumonia.  The patient was set to be discharged,     however, developed fever, T-max of 101 and some respiratory     distress.  CT scan was done to rule out a pulmonary embolus,     however, it showed no pulmonary embolus, but did show findings     consistent with a pneumonia.  The patient was started on     appropriate IV antibiotics in the form of Zosyn.  She has improved     clinically, however, still has some residual hypoxemia. 3. Acute hypoxic respiratory failure.  The patient likely has some     underlying chronic hypoxemia.  However, I believe that her acute     component is multifactorial owing to her pneumonia in addition to     the fluid overload.  The patient's Lasix has been increased to 40     mg twice a day and I will defer to her primary care physician and     her cardiologist to further wean her oxygen down. 4. Acute  renal failure.  The patient had been on several antibiotics,     ACE inhibitors, and NSAIDs.  In addition, the patient received     vancomycin.  The patient's vancomycin was discontinued and the     patient was hydrated with resolution of her acute renal failure.     At the time of discharge, her creatinine is 0.97 with a BUN of 16.  Otherwise, the patient's medical problems have remained stable.  At the time of  discharge, the patient is stable and physical examination is as such: VITAL SIGNS:  Temperature is 98.3, heart rate 65, respiratory rate 20, blood pressure 141/68, O2 sats are 98% on 2 liters at rest, however, sats fell down 82% with ambulation. GENERAL:  The patient is a morbidly obese female in no acute distress. HEENT:  She is normocephalic and atraumatic.  Her pupils are equally round and reactive to light and accommodation.  Extraocular movements are intact.  Oropharynx is moist.  No exudate, erythema, or lesions are noted. NECK:  The patient's neck is obese.  It is difficult to appreciate any JVD.  However, the patient has no carotid bruit.  Trachea is midline. No masses.  No thyromegaly noted. RESPIRATORY:  The patient has a normal respiratory effort.  She has mild bibasilar crackles.  No wheezing or rhonchi noted. CARDIOVASCULAR:  She has got a normal S1 and S2.  No murmurs, rubs, or gallops are noted.  PMI is nondisplaced.  No heaves or thrills on palpation. ABDOMEN:  Morbidly obese, soft, nontender, and nondistended.  No masses. No hepatosplenomegaly. EXTREMITIES:  The patient has markedly decreased erythema.  There is no warmth on the left lower extremity and the patient has good capillary refill with good pulses in the lower extremities and decreased swelling. PSYCHIATRIC:  She is alert and oriented x3.  Good insight and cognition. Good recent and remote recall. NEUROLOGICAL:  She has no focal neurological deficits.  FOLLOWUP:  The patient will follow up with her  primary care physician, Dr. Maurice Small with an appointment in 2 weeks.  She is to follow up with Dr. Armanda Magic on January 20, 2011, at 3:30 p.m. and with Dr. Shelle Iron on January 21, 2011, at 9:15 p.m.  DIETARY RESTRICTIONS:  The patient should be on a low-sodium, heart- healthy, diabetic diet.  PHYSICAL RESTRICTIONS:  Activity as tolerated.  The patient will go home with home health to administer her IV antibiotics.  Total time for this discharge process including face-to-face time 47 minutes.     Altha Harm, MD     MAM/MEDQ  D:  01/10/2011  T:  01/11/2011  Job:  [id number]  cc:   Jene Every, M.D. Lacretia Leigh. Ninetta Lights, M.D. Gretta Arab Valentina Lucks, M.D. Armanda Magic, M.D.  Electronically Signed by Marthann Schiller MD on 01/30/2011 12:19:52 PM

## 2011-01-31 LAB — FUNGUS CULTURE W SMEAR

## 2011-07-20 ENCOUNTER — Emergency Department (INDEPENDENT_AMBULATORY_CARE_PROVIDER_SITE_OTHER): Payer: PRIVATE HEALTH INSURANCE

## 2011-07-20 ENCOUNTER — Encounter (HOSPITAL_BASED_OUTPATIENT_CLINIC_OR_DEPARTMENT_OTHER): Payer: Self-pay | Admitting: *Deleted

## 2011-07-20 ENCOUNTER — Emergency Department (HOSPITAL_BASED_OUTPATIENT_CLINIC_OR_DEPARTMENT_OTHER)
Admission: EM | Admit: 2011-07-20 | Discharge: 2011-07-20 | Disposition: A | Discharge status: Home / Self Care | Payer: PRIVATE HEALTH INSURANCE | Attending: Emergency Medicine | Admitting: Emergency Medicine

## 2011-07-20 DIAGNOSIS — E119 Type 2 diabetes mellitus without complications: Secondary | ICD-10-CM | POA: Insufficient documentation

## 2011-07-20 DIAGNOSIS — E7800 Pure hypercholesterolemia, unspecified: Secondary | ICD-10-CM | POA: Insufficient documentation

## 2011-07-20 DIAGNOSIS — R059 Cough, unspecified: Secondary | ICD-10-CM | POA: Insufficient documentation

## 2011-07-20 DIAGNOSIS — R509 Fever, unspecified: Secondary | ICD-10-CM

## 2011-07-20 DIAGNOSIS — K219 Gastro-esophageal reflux disease without esophagitis: Secondary | ICD-10-CM | POA: Insufficient documentation

## 2011-07-20 DIAGNOSIS — J45909 Unspecified asthma, uncomplicated: Secondary | ICD-10-CM | POA: Insufficient documentation

## 2011-07-20 DIAGNOSIS — I10 Essential (primary) hypertension: Secondary | ICD-10-CM | POA: Insufficient documentation

## 2011-07-20 DIAGNOSIS — R05 Cough: Secondary | ICD-10-CM

## 2011-07-20 DIAGNOSIS — I509 Heart failure, unspecified: Secondary | ICD-10-CM | POA: Insufficient documentation

## 2011-07-20 HISTORY — DX: Pure hypercholesterolemia, unspecified: E78.00

## 2011-07-20 HISTORY — DX: Gastro-esophageal reflux disease without esophagitis: K21.9

## 2011-07-20 HISTORY — DX: Heart failure, unspecified: I50.9

## 2011-07-20 HISTORY — DX: Unspecified osteoarthritis, unspecified site: M19.90

## 2011-07-20 HISTORY — DX: Calculus of kidney: N20.0

## 2011-07-20 HISTORY — DX: Anxiety disorder, unspecified: F41.9

## 2011-07-20 HISTORY — DX: Essential (primary) hypertension: I10

## 2011-07-20 HISTORY — DX: Phlebitis and thrombophlebitis of unspecified site: I80.9

## 2011-07-20 MED ORDER — HYDROCOD POLST-CHLORPHEN POLST 10-8 MG/5ML PO LQCR: 5.0000 mL | Freq: Two times a day (BID) | ORAL | Status: DC | PRN

## 2011-07-20 MED ORDER — HYDROCOD POLST-CHLORPHEN POLST 10-8 MG/5ML PO LQCR
5.0000 mL | Freq: Once | ORAL | Status: AC
  Administered 2011-07-20: 5 mL via ORAL
  Filled 2011-07-20: qty 5

## 2011-07-20 NOTE — ED Notes (Signed)
Pt states she has had a cough x 1 week. Got Zpack from work and finished Friday. Prod cough with white sputum today.

## 2011-07-20 NOTE — ED Provider Notes (Signed)
History     CSN: [id number]  Arrival date & time 07/20/11  2017   First MD Initiated Contact with Patient 07/20/11 2057      Chief Complaint  Patient presents with  . 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 female presenting with cough. The history is provided by the patient. No language interpreter was used.  Cough This is a new problem. The problem occurs constantly. The problem has not changed since onset.The cough is non-productive. There has been no fever. Associated symptoms include rhinorrhea. Pertinent negatives include no shortness of breath and no wheezing. She is not a smoker. Her past medical history is significant for bronchitis.    Past Medical History  Diagnosis Date  . Diabetes mellitus   . Hypertension   . CHF (congestive heart failure)   . Asthma   . Osteoarthritis   . Anxiety   . GERD (gastroesophageal reflux disease)   . Kidney stones   . Hypercholesteremia   . Thrombophlebitis     Past Surgical History  Procedure Date  . Appendectomy   . Cholecystectomy   . Breast surgery   . Abdominal hysterectomy   . Carpal tunnel release   . Incision and drainage perirectal abscess   . Colonoscopy   . Esophagoscopy   . Pyelogram     History reviewed. No pertinent family history.  History  Substance Use Topics  . Smoking status: Never Smoker   . Smokeless tobacco: Not on file  . Alcohol Use: Yes    OB History    Grav Para Term Preterm Abortions TAB SAB Ect Mult Living                  Review of Systems  HENT: Positive for rhinorrhea.   Respiratory: Positive for cough. Negative for shortness of breath and  wheezing.   All other systems reviewed and are negative.    Allergies  Indocin and Lipitor  Home Medications  No current outpatient prescriptions on file.  BP 180/76  Pulse 82  Temp(Src) 98.9 F (37.2 C) (Oral)  Resp 22  Ht 5' (1.524 m)  Wt 311 lb (141.069 kg)  BMI 60.74 kg/m2  SpO2 98%  Physical Exam  Nursing note and vitals reviewed. Constitutional: She is oriented to person, place, and time. She appears well-developed and well-nourished.  HENT:  Head: Normocephalic and atraumatic.  Right Ear: External ear normal.  Mouth/Throat: Oropharynx is clear and moist.  Cardiovascular: Normal rate and regular rhythm.   Pulmonary/Chest: Effort normal and breath sounds normal.  Musculoskeletal: Normal range of motion.  Neurological: She is alert and oriented to person, place, and time.  Skin: Skin is warm and dry.  Psychiatric: She has a normal mood and affect.    ED Course  Procedures (including critical care time)  Labs Reviewed - No data to display Dg Chest 2 View  07/20/2011  *RADIOLOGY REPORT*  Clinical Data: Cough and fever for 1 week.  CHEST - 2 VIEW  Comparison: 01/24/2011.  Findings:  Cardiopericardial silhouette within normal limits. Mediastinal contours normal. Trachea midline.  No airspace disease or effusion.  IMPRESSION: No acute cardiopulmonary disease.  No interval  change.  Original Report Authenticated By: Andreas Newport, M.D.     1. Cough       MDM  No infection noted on x-ray:will treat with cough medication        Teressa Lower, NP 07/20/11 2222

## 2011-07-25 NOTE — ED Provider Notes (Signed)
Evaluation and management procedures were performed by the PA/NP under my supervision/collaboration.    Anaiyah Anglemyer D Curlie Macken, MD 07/25/11 1714 

## 2013-02-07 ENCOUNTER — Ambulatory Visit: Payer: PRIVATE HEALTH INSURANCE | Admitting: Endocrinology

## 2013-04-28 ENCOUNTER — Other Ambulatory Visit (HOSPITAL_COMMUNITY): Payer: Self-pay | Admitting: Cardiology

## 2013-04-28 ENCOUNTER — Ambulatory Visit (HOSPITAL_COMMUNITY): Payer: BC Managed Care – PPO | Attending: Cardiology | Admitting: Radiology

## 2013-04-28 ENCOUNTER — Telehealth: Payer: Self-pay | Admitting: General Surgery

## 2013-04-28 DIAGNOSIS — I359 Nonrheumatic aortic valve disorder, unspecified: Secondary | ICD-10-CM

## 2013-04-28 DIAGNOSIS — Z86711 Personal history of pulmonary embolism: Secondary | ICD-10-CM | POA: Diagnosis not present

## 2013-04-28 DIAGNOSIS — G4733 Obstructive sleep apnea (adult) (pediatric): Secondary | ICD-10-CM | POA: Diagnosis not present

## 2013-04-28 DIAGNOSIS — Z8614 Personal history of Methicillin resistant Staphylococcus aureus infection: Secondary | ICD-10-CM | POA: Insufficient documentation

## 2013-04-28 DIAGNOSIS — I10 Essential (primary) hypertension: Secondary | ICD-10-CM | POA: Diagnosis not present

## 2013-04-28 DIAGNOSIS — E1142 Type 2 diabetes mellitus with diabetic polyneuropathy: Secondary | ICD-10-CM | POA: Insufficient documentation

## 2013-04-28 DIAGNOSIS — I080 Rheumatic disorders of both mitral and aortic valves: Secondary | ICD-10-CM | POA: Diagnosis not present

## 2013-04-28 DIAGNOSIS — E785 Hyperlipidemia, unspecified: Secondary | ICD-10-CM | POA: Insufficient documentation

## 2013-04-28 DIAGNOSIS — I079 Rheumatic tricuspid valve disease, unspecified: Secondary | ICD-10-CM | POA: Insufficient documentation

## 2013-04-28 DIAGNOSIS — E669 Obesity, unspecified: Secondary | ICD-10-CM | POA: Diagnosis not present

## 2013-04-28 DIAGNOSIS — E1149 Type 2 diabetes mellitus with other diabetic neurological complication: Secondary | ICD-10-CM | POA: Insufficient documentation

## 2013-04-28 DIAGNOSIS — F411 Generalized anxiety disorder: Secondary | ICD-10-CM | POA: Diagnosis not present

## 2013-04-28 NOTE — Progress Notes (Signed)
Echocardiogram performed.  

## 2013-04-28 NOTE — Telephone Encounter (Signed)
Message copied by Nita Sells on Thu Apr 28, 2013  2:47 PM ------      Message from: Armanda Magic R      Created: Thu Apr 28, 2013 10:11 AM       Please let patient know that her echo showed normal LVF with increased stiffness of the heart muscle, very mild AS ------

## 2013-04-28 NOTE — Telephone Encounter (Signed)
Pt is aware.  

## 2013-07-26 DIAGNOSIS — H04129 Dry eye syndrome of unspecified lacrimal gland: Secondary | ICD-10-CM | POA: Diagnosis not present

## 2013-07-26 DIAGNOSIS — IMO0002 Reserved for concepts with insufficient information to code with codable children: Secondary | ICD-10-CM | POA: Diagnosis not present

## 2013-07-26 DIAGNOSIS — H18599 Other hereditary corneal dystrophies, unspecified eye: Secondary | ICD-10-CM | POA: Diagnosis not present

## 2013-07-26 DIAGNOSIS — H2589 Other age-related cataract: Secondary | ICD-10-CM | POA: Diagnosis not present

## 2013-07-26 DIAGNOSIS — H5000 Unspecified esotropia: Secondary | ICD-10-CM | POA: Diagnosis not present

## 2013-08-04 DIAGNOSIS — E785 Hyperlipidemia, unspecified: Secondary | ICD-10-CM | POA: Diagnosis not present

## 2013-08-04 DIAGNOSIS — E1142 Type 2 diabetes mellitus with diabetic polyneuropathy: Secondary | ICD-10-CM | POA: Diagnosis not present

## 2013-08-04 DIAGNOSIS — E669 Obesity, unspecified: Secondary | ICD-10-CM | POA: Diagnosis not present

## 2013-08-04 DIAGNOSIS — E1149 Type 2 diabetes mellitus with other diabetic neurological complication: Secondary | ICD-10-CM | POA: Diagnosis not present

## 2013-08-04 DIAGNOSIS — I10 Essential (primary) hypertension: Secondary | ICD-10-CM | POA: Diagnosis not present

## 2013-08-15 DIAGNOSIS — H25049 Posterior subcapsular polar age-related cataract, unspecified eye: Secondary | ICD-10-CM | POA: Diagnosis not present

## 2013-08-15 DIAGNOSIS — H2589 Other age-related cataract: Secondary | ICD-10-CM | POA: Diagnosis not present

## 2013-08-15 DIAGNOSIS — H2510 Age-related nuclear cataract, unspecified eye: Secondary | ICD-10-CM | POA: Diagnosis not present

## 2013-08-22 ENCOUNTER — Encounter (HOSPITAL_BASED_OUTPATIENT_CLINIC_OR_DEPARTMENT_OTHER): Payer: Self-pay | Admitting: Emergency Medicine

## 2013-08-22 ENCOUNTER — Emergency Department (HOSPITAL_BASED_OUTPATIENT_CLINIC_OR_DEPARTMENT_OTHER)
Admission: EM | Admit: 2013-08-22 | Discharge: 2013-08-22 | Disposition: A | Discharge status: Home / Self Care | Payer: BC Managed Care – PPO | Attending: Emergency Medicine | Admitting: Emergency Medicine

## 2013-08-22 DIAGNOSIS — Z791 Long term (current) use of non-steroidal anti-inflammatories (NSAID): Secondary | ICD-10-CM | POA: Diagnosis not present

## 2013-08-22 DIAGNOSIS — J45909 Unspecified asthma, uncomplicated: Secondary | ICD-10-CM | POA: Diagnosis not present

## 2013-08-22 DIAGNOSIS — Z87442 Personal history of urinary calculi: Secondary | ICD-10-CM | POA: Insufficient documentation

## 2013-08-22 DIAGNOSIS — E7800 Pure hypercholesterolemia, unspecified: Secondary | ICD-10-CM | POA: Diagnosis not present

## 2013-08-22 DIAGNOSIS — Z8672 Personal history of thrombophlebitis: Secondary | ICD-10-CM | POA: Insufficient documentation

## 2013-08-22 DIAGNOSIS — I509 Heart failure, unspecified: Secondary | ICD-10-CM | POA: Insufficient documentation

## 2013-08-22 DIAGNOSIS — K219 Gastro-esophageal reflux disease without esophagitis: Secondary | ICD-10-CM | POA: Insufficient documentation

## 2013-08-22 DIAGNOSIS — Z79899 Other long term (current) drug therapy: Secondary | ICD-10-CM | POA: Insufficient documentation

## 2013-08-22 DIAGNOSIS — Z794 Long term (current) use of insulin: Secondary | ICD-10-CM | POA: Diagnosis not present

## 2013-08-22 DIAGNOSIS — M1990 Unspecified osteoarthritis, unspecified site: Secondary | ICD-10-CM | POA: Insufficient documentation

## 2013-08-22 DIAGNOSIS — R040 Epistaxis: Secondary | ICD-10-CM | POA: Insufficient documentation

## 2013-08-22 DIAGNOSIS — Z7982 Long term (current) use of aspirin: Secondary | ICD-10-CM | POA: Diagnosis not present

## 2013-08-22 DIAGNOSIS — I10 Essential (primary) hypertension: Secondary | ICD-10-CM | POA: Diagnosis not present

## 2013-08-22 DIAGNOSIS — F411 Generalized anxiety disorder: Secondary | ICD-10-CM | POA: Insufficient documentation

## 2013-08-22 DIAGNOSIS — E119 Type 2 diabetes mellitus without complications: Secondary | ICD-10-CM | POA: Insufficient documentation

## 2013-08-22 MED ORDER — OXYMETAZOLINE HCL 0.05 % NA SOLN
2.0000 | Freq: Once | NASAL | Status: AC
  Administered 2013-08-22: 2 via NASAL
  Filled 2013-08-22: qty 15

## 2013-08-22 NOTE — ED Provider Notes (Signed)
CSN: [id number]     Arrival date & time 08/22/13  5366 History   First MD Initiated Contact with Patient 08/22/13 [phone]     Chief Complaint  Patient presents with  . Epistaxis   (Consider location/radiation/quality/duration/timing/severity/associated sxs/prior Treatment) HPI Comments: Patient presents with a nosebleed. She states it started about 30 minutes prior to arrival and has been cleaning out the right naris. It seems to have stopped on arrival to the ED. She denies any prior history of nosebleeds. She denies any history of bleeding gums or other sources of bleeding. She denies any easy bruising. She denies any recent cold or sinus problems. She denies any dizziness chest pain or shortness of breath.  Patient is a 68 y.o. female presenting with nosebleeds.  Epistaxis Associated symptoms: no congestion and no dizziness     Past Medical History  Diagnosis Date  . Diabetes mellitus   . Hypertension   . CHF (congestive heart failure)   . Asthma   . Osteoarthritis   . Anxiety   . GERD (gastroesophageal reflux disease)   . Kidney stones   . Hypercholesteremia   . Thrombophlebitis    Past Surgical History  Procedure Laterality Date  . Appendectomy    . Cholecystectomy    . Breast surgery    . Abdominal hysterectomy    . Carpal tunnel release    . Incision and drainage perirectal abscess    . Colonoscopy    . Esophagoscopy    . Pyelogram     History reviewed. No pertinent family history. History  Substance Use Topics  . Smoking status: Never Smoker   . Smokeless tobacco: Not on file  . Alcohol Use: Yes   OB History   Grav Para Term Preterm Abortions TAB SAB Ect Mult Living                 Review of Systems  Constitutional: Negative for fatigue.  HENT: Positive for nosebleeds. Negative for congestion and sinus pressure.   Eyes: Negative for visual disturbance.  Respiratory: Negative for chest tightness and shortness of breath.   Cardiovascular: Negative for chest  pain.  Gastrointestinal: Negative for nausea and vomiting.  Genitourinary: Negative.   Musculoskeletal: Negative.   Skin: Negative for rash.  Neurological: Negative for dizziness and light-headedness.  Hematological: Does not bruise/bleed easily.    Allergies  Crestor; Indomethacin; Invanz; and Lipitor  Home Medications   Current Outpatient Rx  Name  Route  Sig  Dispense  Refill  . glucose blood test strip   Other   1 each by Other route as needed for other. Use as instructed         . Liraglutide (VICTOZA [place])   Subcutaneous   Inject 1.8 Units into the skin every morning.         Marland Kitchen albuterol (PROVENTIL HFA;VENTOLIN HFA) 108 (90 BASE) MCG/ACT inhaler   Inhalation   Inhale 2 puffs into the lungs every 4 (four) hours as needed. For shortness of breath and wheezing          . amLODipine (NORVASC) 5 MG tablet   Oral   Take 5 mg by mouth daily.           Marland Kitchen aspirin EC 81 MG tablet   Oral   Take 81 mg by mouth daily.           . Calcium Carbonate-Vitamin D (CALTRATE 600+D) 600-400 MG-UNIT per tablet   Oral   Take 1 tablet by mouth  daily.           . chlorpheniramine-HYDROcodone (TUSSIONEX PENNKINETIC ER) 10-8 MG/5ML LQCR   Oral   Take 5 mLs by mouth every 12 (twelve) hours as needed.   140 mL   0   . dexlansoprazole (DEXILANT) 60 MG capsule   Oral   Take 60 mg by mouth daily.           Marland Kitchen dextromethorphan-guaiFENesin (MUCINEX DM) 30-600 MG per 12 hr tablet   Oral   Take 1 tablet by mouth every 12 (twelve) hours.           . ferrous sulfate 325 (65 FE) MG tablet   Oral   Take 325 mg by mouth 2 (two) times daily.          . fish oil-omega-3 fatty acids 1000 MG capsule   Oral   Take 1 g by mouth daily.           . furosemide (LASIX) 40 MG tablet   Oral   Take 40 mg by mouth 2 (two) times daily.           Marland Kitchen gabapentin (NEURONTIN) 300 MG capsule   Oral   Take 600 mg by mouth 2 (two) times daily.          Marland Kitchen ibuprofen (ADVIL,MOTRIN) 200 MG  tablet   Oral   Take 800 mg by mouth every 6 (six) hours as needed. For pain           . insulin aspart (NOVOLOG FLEXPEN) 100 UNIT/ML injection   Subcutaneous   Inject 25 Units into the skin 3 (three) times daily before meals. Sliding Scale           . insulin glargine (LANTUS SOLOSTAR) 100 UNIT/ML injection   Subcutaneous   Inject 50-60 Units into the skin 2 (two) times daily. Give 50 units in the morning and 60 units at bedtime           . lisinopril-hydrochlorothiazide (PRINZIDE,ZESTORETIC) 20-12.5 MG per tablet   Oral   Take 1 tablet by mouth daily.           Marland Kitchen loratadine (CLARITIN) 10 MG tablet   Oral   Take 10 mg by mouth daily.           . meloxicam (MOBIC) 15 MG tablet   Oral   Take 15 mg by mouth daily.           . Metoclopramide HCl (METOZOLV ODT) 5 MG TBDP   Oral   Take 1 tablet by mouth 4 (four) times daily -  before meals and at bedtime.           . Multiple Vitamin (MULITIVITAMIN WITH MINERALS) TABS   Oral   Take 1 tablet by mouth daily.           Marland Kitchen PARoxetine (PAXIL) 40 MG tablet   Oral   Take 40 mg by mouth every morning.           Vladimir Faster Glycol-Propyl Glycol (LUBRICANT EYE DROPS) 0.4-0.3 % SOLN   Both Eyes   Place 1 drop into both eyes 3 (three) times daily as needed. For eye irritation         . simvastatin (ZOCOR) 40 MG tablet   Oral   Take 40 mg by mouth every evening.            BP 194/86  Pulse 89  Temp(Src) 98.1 F (36.7 C) (Oral)  Resp 20  SpO2 100% Physical Exam  Constitutional: She is oriented to person, place, and time. She appears well-developed and well-nourished.  HENT:  Head: Normocephalic and atraumatic.  Patient has some dry blood in the right naris. There is no active bleeding. There is no blood in the posterior pharynx.  Eyes: Pupils are equal, round, and reactive to light.  Neck: Normal range of motion. Neck supple.  Cardiovascular: Normal rate, regular rhythm and normal heart sounds.    Pulmonary/Chest: Effort normal and breath sounds normal. No respiratory distress. She has no wheezes. She has no rales. She exhibits no tenderness.  Abdominal: Soft. Bowel sounds are normal. There is no tenderness. There is no rebound and no guarding.  Musculoskeletal: Normal range of motion. She exhibits no edema.  Lymphadenopathy:    She has no cervical adenopathy.  Neurological: She is alert and oriented to person, place, and time.  Skin: Skin is warm and dry. No rash noted.  Psychiatric: She has a normal mood and affect.    ED Course  Procedures (including critical care time) Labs Review Labs Reviewed - No data to display Imaging Review No results found.  EKG Interpretation   None       MDM   1. Epistaxis    Patient given dose of aspirin spray in ED. She had no further episodes of bleeding. She was discharged home in good condition and will use Afrin for the next 24 hours. I encouraged her to return here if the bleeding returns and is not stopped after 24 hours. I also encouraged her to have her blood pressure rechecked by her primary care physician.    Malvin Johns, MD 08/22/13 [phone]

## 2013-08-22 NOTE — ED Notes (Signed)
Pt holding cloth to nose, reports epitaxis onset 30 min pta. Cloth removed by this rn, no active bleeding noted, large blood clot noted on cloth. Pt denies any pain or other c/o.

## 2013-08-22 NOTE — Discharge Instructions (Signed)

## 2013-08-22 NOTE — ED Notes (Signed)
MD at bedside. 

## 2013-08-22 NOTE — ED Notes (Signed)
Pt resting quietly in nad. Denies any pain or other c/o, no further epitaxis noted.

## 2013-08-23 DIAGNOSIS — H2589 Other age-related cataract: Secondary | ICD-10-CM | POA: Diagnosis not present

## 2013-08-29 DIAGNOSIS — H25049 Posterior subcapsular polar age-related cataract, unspecified eye: Secondary | ICD-10-CM | POA: Diagnosis not present

## 2013-08-29 DIAGNOSIS — H2510 Age-related nuclear cataract, unspecified eye: Secondary | ICD-10-CM | POA: Diagnosis not present

## 2013-08-29 DIAGNOSIS — H2589 Other age-related cataract: Secondary | ICD-10-CM | POA: Diagnosis not present

## 2013-08-29 DIAGNOSIS — H5700 Unspecified anomaly of pupillary function: Secondary | ICD-10-CM | POA: Diagnosis not present

## 2013-11-08 DIAGNOSIS — E1149 Type 2 diabetes mellitus with other diabetic neurological complication: Secondary | ICD-10-CM | POA: Diagnosis not present

## 2013-11-08 DIAGNOSIS — E785 Hyperlipidemia, unspecified: Secondary | ICD-10-CM | POA: Diagnosis not present

## 2013-11-08 DIAGNOSIS — Z6841 Body Mass Index (BMI) 40.0 and over, adult: Secondary | ICD-10-CM | POA: Diagnosis not present

## 2013-11-08 DIAGNOSIS — E1142 Type 2 diabetes mellitus with diabetic polyneuropathy: Secondary | ICD-10-CM | POA: Diagnosis not present

## 2013-11-08 DIAGNOSIS — E669 Obesity, unspecified: Secondary | ICD-10-CM | POA: Diagnosis not present

## 2013-11-29 ENCOUNTER — Other Ambulatory Visit: Payer: Self-pay

## 2013-11-29 DIAGNOSIS — Z1231 Encounter for screening mammogram for malignant neoplasm of breast: Secondary | ICD-10-CM

## 2013-12-07 ENCOUNTER — Ambulatory Visit
Admission: RE | Admit: 2013-12-07 | Discharge: 2013-12-07 | Disposition: A | Discharge status: Home / Self Care | Payer: Medicare Other | Source: Ambulatory Visit

## 2013-12-07 DIAGNOSIS — Z1231 Encounter for screening mammogram for malignant neoplasm of breast: Secondary | ICD-10-CM

## 2014-02-06 DIAGNOSIS — E669 Obesity, unspecified: Secondary | ICD-10-CM | POA: Diagnosis not present

## 2014-02-06 DIAGNOSIS — I110 Hypertensive heart disease with heart failure: Secondary | ICD-10-CM | POA: Diagnosis not present

## 2014-02-06 DIAGNOSIS — E1142 Type 2 diabetes mellitus with diabetic polyneuropathy: Secondary | ICD-10-CM | POA: Diagnosis not present

## 2014-02-06 DIAGNOSIS — E1149 Type 2 diabetes mellitus with other diabetic neurological complication: Secondary | ICD-10-CM | POA: Diagnosis not present

## 2014-02-06 DIAGNOSIS — Z0000 Encounter for general adult medical examination without abnormal findings: Secondary | ICD-10-CM | POA: Diagnosis not present

## 2014-02-06 DIAGNOSIS — I509 Heart failure, unspecified: Secondary | ICD-10-CM | POA: Diagnosis not present

## 2014-02-06 DIAGNOSIS — I10 Essential (primary) hypertension: Secondary | ICD-10-CM | POA: Diagnosis not present

## 2014-02-06 DIAGNOSIS — E785 Hyperlipidemia, unspecified: Secondary | ICD-10-CM | POA: Diagnosis not present

## 2014-02-06 DIAGNOSIS — G4733 Obstructive sleep apnea (adult) (pediatric): Secondary | ICD-10-CM | POA: Diagnosis not present

## 2014-02-06 DIAGNOSIS — Z23 Encounter for immunization: Secondary | ICD-10-CM | POA: Diagnosis not present

## 2014-04-11 DIAGNOSIS — I10 Essential (primary) hypertension: Secondary | ICD-10-CM | POA: Diagnosis not present

## 2014-04-11 DIAGNOSIS — E1142 Type 2 diabetes mellitus with diabetic polyneuropathy: Secondary | ICD-10-CM | POA: Diagnosis not present

## 2014-04-11 DIAGNOSIS — E669 Obesity, unspecified: Secondary | ICD-10-CM | POA: Diagnosis not present

## 2014-04-11 DIAGNOSIS — E1149 Type 2 diabetes mellitus with other diabetic neurological complication: Secondary | ICD-10-CM | POA: Diagnosis not present

## 2014-04-11 DIAGNOSIS — E785 Hyperlipidemia, unspecified: Secondary | ICD-10-CM | POA: Diagnosis not present

## 2014-04-11 DIAGNOSIS — R609 Edema, unspecified: Secondary | ICD-10-CM | POA: Diagnosis not present

## 2014-05-05 ENCOUNTER — Encounter: Payer: Self-pay | Admitting: *Deleted

## 2014-05-07 ENCOUNTER — Emergency Department (HOSPITAL_BASED_OUTPATIENT_CLINIC_OR_DEPARTMENT_OTHER)
Admission: EM | Admit: 2014-05-07 | Discharge: 2014-05-07 | Disposition: A | Discharge status: Home / Self Care | Payer: BC Managed Care – PPO | Attending: Emergency Medicine | Admitting: Emergency Medicine

## 2014-05-07 ENCOUNTER — Encounter (HOSPITAL_BASED_OUTPATIENT_CLINIC_OR_DEPARTMENT_OTHER): Payer: Self-pay | Admitting: Emergency Medicine

## 2014-05-07 DIAGNOSIS — Z87442 Personal history of urinary calculi: Secondary | ICD-10-CM | POA: Insufficient documentation

## 2014-05-07 DIAGNOSIS — E780 Pure hypercholesterolemia: Secondary | ICD-10-CM | POA: Diagnosis not present

## 2014-05-07 DIAGNOSIS — Z794 Long term (current) use of insulin: Secondary | ICD-10-CM | POA: Insufficient documentation

## 2014-05-07 DIAGNOSIS — Y92009 Unspecified place in unspecified non-institutional (private) residence as the place of occurrence of the external cause: Secondary | ICD-10-CM | POA: Diagnosis not present

## 2014-05-07 DIAGNOSIS — Z791 Long term (current) use of non-steroidal anti-inflammatories (NSAID): Secondary | ICD-10-CM | POA: Diagnosis not present

## 2014-05-07 DIAGNOSIS — W260XXA Contact with knife, initial encounter: Secondary | ICD-10-CM | POA: Diagnosis not present

## 2014-05-07 DIAGNOSIS — Z79899 Other long term (current) drug therapy: Secondary | ICD-10-CM | POA: Diagnosis not present

## 2014-05-07 DIAGNOSIS — F419 Anxiety disorder, unspecified: Secondary | ICD-10-CM | POA: Diagnosis not present

## 2014-05-07 DIAGNOSIS — S61011A Laceration without foreign body of right thumb without damage to nail, initial encounter: Secondary | ICD-10-CM | POA: Diagnosis not present

## 2014-05-07 DIAGNOSIS — Z23 Encounter for immunization: Secondary | ICD-10-CM | POA: Diagnosis not present

## 2014-05-07 DIAGNOSIS — J45909 Unspecified asthma, uncomplicated: Secondary | ICD-10-CM | POA: Diagnosis not present

## 2014-05-07 DIAGNOSIS — Z7982 Long term (current) use of aspirin: Secondary | ICD-10-CM | POA: Diagnosis not present

## 2014-05-07 DIAGNOSIS — I509 Heart failure, unspecified: Secondary | ICD-10-CM | POA: Diagnosis not present

## 2014-05-07 DIAGNOSIS — K219 Gastro-esophageal reflux disease without esophagitis: Secondary | ICD-10-CM | POA: Insufficient documentation

## 2014-05-07 DIAGNOSIS — Z8672 Personal history of thrombophlebitis: Secondary | ICD-10-CM | POA: Diagnosis not present

## 2014-05-07 DIAGNOSIS — I10 Essential (primary) hypertension: Secondary | ICD-10-CM | POA: Insufficient documentation

## 2014-05-07 DIAGNOSIS — Y93E9 Activity, other interior property and clothing maintenance: Secondary | ICD-10-CM | POA: Diagnosis not present

## 2014-05-07 DIAGNOSIS — E119 Type 2 diabetes mellitus without complications: Secondary | ICD-10-CM | POA: Insufficient documentation

## 2014-05-07 DIAGNOSIS — M1990 Unspecified osteoarthritis, unspecified site: Secondary | ICD-10-CM | POA: Diagnosis not present

## 2014-05-07 MED ORDER — TETANUS-DIPHTH-ACELL PERTUSSIS 5-2.5-18.5 LF-MCG/0.5 IM SUSP
0.5000 mL | Freq: Once | INTRAMUSCULAR | Status: AC
  Administered 2014-05-07: 0.5 mL via INTRAMUSCULAR
  Filled 2014-05-07: qty 0.5

## 2014-05-07 NOTE — ED Notes (Signed)
Pt reports she was moving a table when she suffered a laceration to her right thumb. Bleeding controlled at this time.

## 2014-05-07 NOTE — Discharge Instructions (Signed)
Leave dressing in place for the next 24 hours.  After this, change the dressing and apply bacitracin twice daily for the next week.  Return to the ER for redness, purulent drainage, streaks up the arm, or other new and concerning symptoms.   Laceration Care, Adult A laceration is a cut or lesion that goes through all layers of the skin and into the tissue just beneath the skin. TREATMENT  Some lacerations may not require closure. Some lacerations may not be able to be closed due to an increased risk of infection. It is important to see your caregiver as soon as possible after an injury to minimize the risk of infection and maximize the opportunity for successful closure. If closure is appropriate, pain medicines may be given, if needed. The wound will be cleaned to help prevent infection. Your caregiver will use stitches (sutures), staples, wound glue (adhesive), or skin adhesive strips to repair the laceration. These tools bring the skin edges together to allow for faster healing and a better cosmetic outcome. However, all wounds will heal with a scar. Once the wound has healed, scarring can be minimized by covering the wound with sunscreen during the day for 1 full year. HOME CARE INSTRUCTIONS  For sutures or staples:  Keep the wound clean and dry.  If you were given a bandage (dressing), you should change it at least once a day. Also, change the dressing if it becomes wet or dirty, or as directed by your caregiver.  Wash the wound with soap and water 2 times a day. Rinse the wound off with water to remove all soap. Pat the wound dry with a clean towel.  After cleaning, apply a thin layer of the antibiotic ointment as recommended by your caregiver. This will help prevent infection and keep the dressing from sticking.  You may shower as usual after the first 24 hours. Do not soak the wound in water until the sutures are removed.  Only take over-the-counter or prescription medicines for  pain, discomfort, or fever as directed by your caregiver.  Get your sutures or staples removed as directed by your caregiver. For skin adhesive strips:  Keep the wound clean and dry.  Do not get the skin adhesive strips wet. You may bathe carefully, using caution to keep the wound dry.  If the wound gets wet, pat it dry with a clean towel.  Skin adhesive strips will fall off on their own. You may trim the strips as the wound heals. Do not remove skin adhesive strips that are still stuck to the wound. They will fall off in time. For wound adhesive:  You may briefly wet your wound in the shower or bath. Do not soak or scrub the wound. Do not swim. Avoid periods of heavy perspiration until the skin adhesive has fallen off on its own. After showering or bathing, gently pat the wound dry with a clean towel.  Do not apply liquid medicine, cream medicine, or ointment medicine to your wound while the skin adhesive is in place. This may loosen the film before your wound is healed.  If a dressing is placed over the wound, be careful not to apply tape directly over the skin adhesive. This may cause the adhesive to be pulled off before the wound is healed.  Avoid prolonged exposure to sunlight or tanning lamps while the skin adhesive is in place. Exposure to ultraviolet light in the first year will darken the scar.  The skin adhesive will usually remain  in place for 5 to 10 days, then naturally fall off the skin. Do not pick at the adhesive film. You may need a tetanus shot if:  You cannot remember when you had your last tetanus shot.  You have never had a tetanus shot. If you get a tetanus shot, your arm may swell, get red, and feel warm to the touch. This is common and not a problem. If you need a tetanus shot and you choose not to have one, there is a rare chance of getting tetanus. Sickness from tetanus can be serious. SEEK MEDICAL CARE IF:   You have redness, swelling, or increasing pain in  the wound.  You see a red line that goes away from the wound.  You have yellowish-white fluid (pus) coming from the wound.  You have a fever.  You notice a bad smell coming from the wound or dressing.  Your wound breaks open before or after sutures have been removed.  You notice something coming out of the wound such as wood or glass.  Your wound is on your hand or foot and you cannot move a finger or toe. SEEK IMMEDIATE MEDICAL CARE IF:   Your pain is not controlled with prescribed medicine.  You have severe swelling around the wound causing pain and numbness or a change in color in your arm, hand, leg, or foot.  Your wound splits open and starts bleeding.  You have worsening numbness, weakness, or loss of function of any joint around or beyond the wound.  You develop painful lumps near the wound or on the skin anywhere on your body. MAKE SURE YOU:   Understand these instructions.  Will watch your condition.  Will get help right away if you are not doing well or get worse. Document Released: 06/30/2005 Document Revised: 09/22/2011 Document Reviewed: 12/24/2010 [facility] Patient Information 2015 [place], [place]. This information is not intended to replace advice given to you by your health care provider. Make sure you discuss any questions you have with your health care provider.

## 2014-05-07 NOTE — ED Provider Notes (Signed)
CSN: [id number]     Arrival date & time 05/07/14  1649 History  This chart was scribed for Veryl Speak, MD by Peyton Bottoms, ED Scribe. This patient was seen in room MH06/MH06 and the patient's care was started at 5:07 PM.  Chief Complaint  Patient presents with  . Extremity Laceration   Patient is a 68 y.o. female presenting with skin laceration. The history is provided by the patient. No language interpreter was used.  Laceration Location:  Hand Hand laceration location:  R finger Bleeding: controlled with pressure   Laceration mechanism:  Knife Pain details:    Quality:  Aching and throbbing   Severity:  Moderate  HPI Comments: Sharon [name] is a 68 y.o. female with a history of DM, hypertensionwho presents to the Emergency Department complaining of laceration to right thumb that occurred prior to arrival. She states that she was moving the table and a v-slice knife was laying at the end of the table. The knife fell off the table and cut her thumb. Patient is unsure of last tetanus shot.  Past Medical History  Diagnosis Date  . Diabetes mellitus   . Hypertension   . CHF (congestive heart failure)   . Asthma   . Osteoarthritis   . Anxiety   . GERD (gastroesophageal reflux disease)   . Kidney stones   . Hypercholesteremia   . Thrombophlebitis    Past Surgical History  Procedure Laterality Date  . Appendectomy    . Cholecystectomy    . Breast surgery    . Abdominal hysterectomy    . Carpal tunnel release    . Incision and drainage perirectal abscess    . Colonoscopy    . Esophagoscopy    . Pyelogram     History reviewed. No pertinent family history. History  Substance Use Topics  . Smoking status: Never Smoker   . Smokeless tobacco: Not on file  . Alcohol Use: Yes   OB History   Grav Para Term Preterm Abortions TAB SAB Ect Mult Living                 Review of Systems  All other systems reviewed and are negative.  A complete 10 system review of systems  was obtained and all systems are negative except as noted in the HPI and PMH.   Allergies  Crestor; Indomethacin; Invanz; and Lipitor  Home Medications   Prior to Admission medications   Medication Sig Start Date End Date Taking? Authorizing Provider  albuterol (PROVENTIL HFA;VENTOLIN HFA) 108 (90 BASE) MCG/ACT inhaler Inhale 2 puffs into the lungs every 4 (four) hours as needed. For shortness of breath and wheezing     Historical Provider, MD  amLODipine (NORVASC) 5 MG tablet Take 5 mg by mouth daily.      Historical Provider, MD  aspirin EC 81 MG tablet Take 81 mg by mouth daily.      Historical Provider, MD  Calcium Carbonate-Vitamin D (CALTRATE 600+D) 600-400 MG-UNIT per tablet Take 1 tablet by mouth daily.      Historical Provider, MD  chlorpheniramine-HYDROcodone (TUSSIONEX PENNKINETIC ER) 10-8 MG/5ML LQCR Take 5 mLs by mouth every 12 (twelve) hours as needed. 07/20/11   Glendell Docker, NP  dexlansoprazole (DEXILANT) 60 MG capsule Take 60 mg by mouth daily.      Historical Provider, MD  dextromethorphan-guaiFENesin (MUCINEX DM) 30-600 MG per 12 hr tablet Take 1 tablet by mouth every 12 (twelve) hours.      Historical Provider, MD  ferrous sulfate 325 (65 FE) MG tablet Take 325 mg by mouth 2 (two) times daily.     Historical Provider, MD  fish oil-omega-3 fatty acids 1000 MG capsule Take 1 g by mouth daily.      Historical Provider, MD  furosemide (LASIX) 40 MG tablet Take 40 mg by mouth 2 (two) times daily.      Historical Provider, MD  gabapentin (NEURONTIN) 300 MG capsule Take 600 mg by mouth 2 (two) times daily.     Historical Provider, MD  glucose blood test strip 1 each by Other route as needed for other. Use as instructed    Historical Provider, MD  ibuprofen (ADVIL,MOTRIN) 200 MG tablet Take 800 mg by mouth every 6 (six) hours as needed. For pain      Historical Provider, MD  insulin aspart (NOVOLOG FLEXPEN) 100 UNIT/ML injection Inject 25 Units into the skin 3 (three) times daily  before meals. Sliding Scale      Historical Provider, MD  insulin glargine (LANTUS SOLOSTAR) 100 UNIT/ML injection Inject 50-60 Units into the skin 2 (two) times daily. Give 50 units in the morning and 60 units at bedtime      Historical Provider, MD  Liraglutide (VICTOZA [place]) Inject 1.8 Units into the skin every morning.    Historical Provider, MD  lisinopril-hydrochlorothiazide (PRINZIDE,ZESTORETIC) 20-12.5 MG per tablet Take 2 tablets by mouth daily.     Historical Provider, MD  loratadine (CLARITIN) 10 MG tablet Take 10 mg by mouth daily.      Historical Provider, MD  meloxicam (MOBIC) 15 MG tablet Take 15 mg by mouth daily.      Historical Provider, MD  Metoclopramide HCl (METOZOLV ODT) 5 MG TBDP Take 1 tablet by mouth 4 (four) times daily -  before meals and at bedtime.      Historical Provider, MD  Multiple Vitamin (MULITIVITAMIN WITH MINERALS) TABS Take 1 tablet by mouth daily.      Historical Provider, MD  PARoxetine (PAXIL) 40 MG tablet Take 40 mg by mouth every morning.      Historical Provider, MD  Polyethyl Glycol-Propyl Glycol (LUBRICANT EYE DROPS) 0.4-0.3 % SOLN Place 1 drop into both eyes 3 (three) times daily as needed. For eye irritation    Historical Provider, MD  simvastatin (ZOCOR) 40 MG tablet Take 40 mg by mouth every evening.      Historical Provider, MD   Triage Vitals: Pulse 80  Temp(Src) 98.9 F (37.2 C) (Oral)  Resp 17  Ht 4\' 11"  (1.499 m)  Wt 280 lb (127.007 kg)  BMI 56.52 kg/m2  SpO2 99%  Physical Exam  Nursing note and vitals reviewed. Constitutional: She is oriented to person, place, and time. She appears well-developed and well-nourished. No distress.  HENT:  Head: Normocephalic and atraumatic.  Eyes: Conjunctivae and EOM are normal.  Neck: Neck supple. No tracheal deviation present.  Cardiovascular: Normal rate.   Pulmonary/Chest: Effort normal. No respiratory distress.  Musculoskeletal: Normal range of motion.  Neurological: She is alert and oriented  to person, place, and time.  Skin: Skin is warm and dry.  Lateral aspect of the right thumb has a 1cm x 0.5 cm area of avulsed tissue. There is bleeding from a small artery within the wound.   Psychiatric: She has a normal mood and affect. Her behavior is normal.   ED Course  Procedures (including critical care time)  DIAGNOSTIC STUDIES: Oxygen Saturation is 99% on RA, normal by my interpretation.    COORDINATION  OF CARE: 5:10 PM- Discussed plans give patient tetanus shot, dress wound and discharge. Pt advised of plan for treatment and pt agrees.  Labs Review Labs Reviewed - No data to display  Imaging Review No results found.   EKG Interpretation None     MDM   Final diagnoses:  None    Patient presents with an avulsion of tissue to the medial aspect of the right thumb that occurred on a mandolin slicer which she caught before it fell on the floor. Quick clot was applied and the wound was dressed. She will be advised to remove the dressing in 24 hours and apply bacitracin twice daily. She is to return as needed if her symptoms worsen or change.  I personally performed the services described in this documentation, which was scribed in my presence. The recorded information has been reviewed and is accurate.  Veryl Speak, MD 05/07/14 [phone]

## 2014-05-07 NOTE — ED Notes (Signed)
Pt discharged to home with family. NAD.  

## 2014-05-17 DIAGNOSIS — L03119 Cellulitis of unspecified part of limb: Secondary | ICD-10-CM | POA: Diagnosis not present

## 2014-06-27 ENCOUNTER — Emergency Department (HOSPITAL_COMMUNITY): Payer: BC Managed Care – PPO

## 2014-06-27 ENCOUNTER — Observation Stay (HOSPITAL_COMMUNITY)
Admission: EM | Admit: 2014-06-27 | Discharge: 2014-06-28 | Disposition: A | Discharge status: Home / Self Care | Payer: BC Managed Care – PPO | Attending: Internal Medicine | Admitting: Internal Medicine

## 2014-06-27 ENCOUNTER — Encounter (HOSPITAL_COMMUNITY): Payer: Self-pay | Admitting: Emergency Medicine

## 2014-06-27 DIAGNOSIS — I509 Heart failure, unspecified: Secondary | ICD-10-CM | POA: Insufficient documentation

## 2014-06-27 DIAGNOSIS — E119 Type 2 diabetes mellitus without complications: Secondary | ICD-10-CM

## 2014-06-27 DIAGNOSIS — R079 Chest pain, unspecified: Principal | ICD-10-CM | POA: Insufficient documentation

## 2014-06-27 DIAGNOSIS — R072 Precordial pain: Secondary | ICD-10-CM | POA: Diagnosis not present

## 2014-06-27 DIAGNOSIS — F419 Anxiety disorder, unspecified: Secondary | ICD-10-CM | POA: Diagnosis not present

## 2014-06-27 DIAGNOSIS — Z794 Long term (current) use of insulin: Secondary | ICD-10-CM | POA: Insufficient documentation

## 2014-06-27 DIAGNOSIS — N200 Calculus of kidney: Secondary | ICD-10-CM | POA: Diagnosis not present

## 2014-06-27 DIAGNOSIS — K219 Gastro-esophageal reflux disease without esophagitis: Secondary | ICD-10-CM | POA: Insufficient documentation

## 2014-06-27 DIAGNOSIS — I809 Phlebitis and thrombophlebitis of unspecified site: Secondary | ICD-10-CM | POA: Insufficient documentation

## 2014-06-27 DIAGNOSIS — R110 Nausea: Secondary | ICD-10-CM | POA: Diagnosis not present

## 2014-06-27 DIAGNOSIS — J45909 Unspecified asthma, uncomplicated: Secondary | ICD-10-CM | POA: Diagnosis not present

## 2014-06-27 DIAGNOSIS — I10 Essential (primary) hypertension: Secondary | ICD-10-CM | POA: Diagnosis not present

## 2014-06-27 DIAGNOSIS — R0789 Other chest pain: Secondary | ICD-10-CM

## 2014-06-27 DIAGNOSIS — Z79899 Other long term (current) drug therapy: Secondary | ICD-10-CM | POA: Insufficient documentation

## 2014-06-27 DIAGNOSIS — J4520 Mild intermittent asthma, uncomplicated: Secondary | ICD-10-CM

## 2014-06-27 DIAGNOSIS — E780 Pure hypercholesterolemia: Secondary | ICD-10-CM | POA: Diagnosis not present

## 2014-06-27 DIAGNOSIS — M1990 Unspecified osteoarthritis, unspecified site: Secondary | ICD-10-CM | POA: Diagnosis not present

## 2014-06-27 DIAGNOSIS — Z7982 Long term (current) use of aspirin: Secondary | ICD-10-CM | POA: Insufficient documentation

## 2014-06-27 DIAGNOSIS — Z791 Long term (current) use of non-steroidal anti-inflammatories (NSAID): Secondary | ICD-10-CM | POA: Insufficient documentation

## 2014-06-27 LAB — CBC WITH DIFFERENTIAL/PLATELET
BASOS ABS: 0 10*3/uL (ref 0.0–0.1)
BASOS PCT: 0 % (ref 0–1)
EOS ABS: 0.3 10*3/uL (ref 0.0–0.7)
EOS PCT: 2 % (ref 0–5)
HCT: 35.5 % — ABNORMAL LOW (ref 36.0–46.0)
Hemoglobin: 11.7 g/dL — ABNORMAL LOW (ref 12.0–15.0)
Lymphocytes Relative: 18 % (ref 12–46)
Lymphs Abs: 2.3 10*3/uL (ref 0.7–4.0)
MCH: 29.5 pg (ref 26.0–34.0)
MCHC: 33 g/dL (ref 30.0–36.0)
MCV: 89.4 fL (ref 78.0–100.0)
Monocytes Absolute: 0.7 10*3/uL (ref 0.1–1.0)
Monocytes Relative: 5 % (ref 3–12)
NEUTROS PCT: 75 % (ref 43–77)
Neutro Abs: 9.7 10*3/uL — ABNORMAL HIGH (ref 1.7–7.7)
Platelets: 307 10*3/uL (ref 150–400)
RBC: 3.97 MIL/uL (ref 3.87–5.11)
RDW: 13.2 % (ref 11.5–15.5)
WBC: 13 10*3/uL — ABNORMAL HIGH (ref 4.0–10.5)

## 2014-06-27 LAB — COMPREHENSIVE METABOLIC PANEL
ALBUMIN: 3.7 g/dL (ref 3.5–5.2)
ALT: 16 U/L (ref 0–35)
AST: 14 U/L (ref 0–37)
Alkaline Phosphatase: 124 U/L — ABNORMAL HIGH (ref 39–117)
Anion gap: 17 — ABNORMAL HIGH (ref 5–15)
BUN: 23 mg/dL (ref 6–23)
CALCIUM: 9.7 mg/dL (ref 8.4–10.5)
CO2: 19 mEq/L (ref 19–32)
Chloride: 101 mEq/L (ref 96–112)
Creatinine, Ser: 1.13 mg/dL — ABNORMAL HIGH (ref 0.50–1.10)
GFR calc Af Amer: 57 mL/min — ABNORMAL LOW (ref >90)
GFR calc non Af Amer: 50 mL/min — ABNORMAL LOW (ref >90)
Glucose, Bld: 160 mg/dL — ABNORMAL HIGH (ref 70–99)
Potassium: 3.9 mEq/L (ref 3.7–5.3)
SODIUM: 137 meq/L (ref 137–147)
TOTAL PROTEIN: 8 g/dL (ref 6.0–8.3)
Total Bilirubin: 0.2 mg/dL — ABNORMAL LOW (ref 0.3–1.2)

## 2014-06-27 LAB — I-STAT TROPONIN, ED: TROPONIN I, POC: 0 ng/mL (ref 0.00–0.08)

## 2014-06-27 LAB — URINE MICROSCOPIC-ADD ON

## 2014-06-27 LAB — URINALYSIS, ROUTINE W REFLEX MICROSCOPIC
Bilirubin Urine: NEGATIVE
Glucose, UA: NEGATIVE mg/dL
HGB URINE DIPSTICK: NEGATIVE
Ketones, ur: NEGATIVE mg/dL
Nitrite: NEGATIVE
PROTEIN: NEGATIVE mg/dL
SPECIFIC GRAVITY, URINE: 1.01 (ref 1.005–1.030)
UROBILINOGEN UA: 0.2 mg/dL (ref 0.0–1.0)
pH: 5 (ref 5.0–8.0)

## 2014-06-27 LAB — TROPONIN I: Troponin I: <0.3 ng/mL (ref <0.30)

## 2014-06-27 LAB — GLUCOSE, CAPILLARY: Glucose-Capillary: 90 mg/dL (ref 70–99)

## 2014-06-27 LAB — LIPASE, BLOOD: Lipase: 69 U/L — ABNORMAL HIGH (ref 11–59)

## 2014-06-27 MED ORDER — INSULIN GLARGINE 100 UNIT/ML ~~LOC~~ SOLN: 50.0000 [IU] | Freq: Two times a day (BID) | SUBCUTANEOUS | Status: DC

## 2014-06-27 MED ORDER — METOCLOPRAMIDE HCL 5 MG PO TBDP: 1.0000 | ORAL_TABLET | Freq: Three times a day (TID) | ORAL | Status: DC

## 2014-06-27 MED ORDER — HYDROCHLOROTHIAZIDE 25 MG PO TABS
25.0000 mg | ORAL_TABLET | Freq: Every day | ORAL | Status: DC
  Administered 2014-06-28: 25 mg via ORAL
  Filled 2014-06-27 (×2): qty 1

## 2014-06-27 MED ORDER — POLYETHYL GLYCOL-PROPYL GLYCOL 0.4-0.3 % OP SOLN: 1.0000 [drp] | Freq: Three times a day (TID) | OPHTHALMIC | Status: DC | PRN

## 2014-06-27 MED ORDER — METOCLOPRAMIDE HCL 5 MG PO TABS: 5.0000 mg | ORAL_TABLET | Freq: Three times a day (TID) | ORAL | Status: DC

## 2014-06-27 MED ORDER — SODIUM CHLORIDE 0.9 % IV SOLN: INTRAVENOUS | Status: DC

## 2014-06-27 MED ORDER — LISINOPRIL-HYDROCHLOROTHIAZIDE 20-12.5 MG PO TABS: 2.0000 | ORAL_TABLET | Freq: Every day | ORAL | Status: DC

## 2014-06-27 MED ORDER — ASPIRIN EC 81 MG PO TBEC
81.0000 mg | DELAYED_RELEASE_TABLET | Freq: Every day | ORAL | Status: DC
Start: 2014-06-27 — End: 2014-06-28
  Administered 2014-06-28: 81 mg via ORAL
  Filled 2014-06-27: qty 1

## 2014-06-27 MED ORDER — POLYVINYL ALCOHOL 1.4 % OP SOLN
1.0000 [drp] | Freq: Three times a day (TID) | OPHTHALMIC | Status: DC | PRN
  Filled 2014-06-27: qty 15

## 2014-06-27 MED ORDER — HEPARIN SODIUM (PORCINE) 5000 UNIT/ML IJ SOLN
5000.0000 [IU] | Freq: Three times a day (TID) | INTRAMUSCULAR | Status: DC
  Administered 2014-06-27 – 2014-06-28 (×2): 5000 [IU] via SUBCUTANEOUS
  Filled 2014-06-27 (×2): qty 1

## 2014-06-27 MED ORDER — FUROSEMIDE 40 MG PO TABS
40.0000 mg | ORAL_TABLET | Freq: Two times a day (BID) | ORAL | Status: DC
  Administered 2014-06-27 – 2014-06-28 (×2): 40 mg via ORAL
  Filled 2014-06-27 (×2): qty 1

## 2014-06-27 MED ORDER — AMLODIPINE BESYLATE 5 MG PO TABS
5.0000 mg | ORAL_TABLET | Freq: Every day | ORAL | Status: DC
  Administered 2014-06-27: 5 mg via ORAL
  Filled 2014-06-27: qty 1

## 2014-06-27 MED ORDER — ACETAMINOPHEN 325 MG PO TABS: 650.0000 mg | ORAL_TABLET | ORAL | Status: DC | PRN

## 2014-06-27 MED ORDER — ZOLPIDEM TARTRATE 5 MG PO TABS: 5.0000 mg | ORAL_TABLET | Freq: Every evening | ORAL | Status: DC | PRN

## 2014-06-27 MED ORDER — OMEGA-3 FATTY ACIDS 1000 MG PO CAPS: 1.0000 g | ORAL_CAPSULE | Freq: Every day | ORAL | Status: DC

## 2014-06-27 MED ORDER — OMEGA-3-ACID ETHYL ESTERS 1 G PO CAPS
1.0000 g | ORAL_CAPSULE | Freq: Every day | ORAL | Status: DC
  Administered 2014-06-28: 1 g via ORAL
  Filled 2014-06-27 (×2): qty 1

## 2014-06-27 MED ORDER — SODIUM CHLORIDE 0.9 % IJ SOLN: 3.0000 mL | INTRAMUSCULAR | Status: DC | PRN

## 2014-06-27 MED ORDER — FERROUS SULFATE 325 (65 FE) MG PO TABS
325.0000 mg | ORAL_TABLET | Freq: Two times a day (BID) | ORAL | Status: DC
  Administered 2014-06-27 – 2014-06-28 (×2): 325 mg via ORAL
  Filled 2014-06-27 (×2): qty 1

## 2014-06-27 MED ORDER — MORPHINE SULFATE 2 MG/ML IJ SOLN: 2.0000 mg | INTRAMUSCULAR | Status: DC | PRN

## 2014-06-27 MED ORDER — LORATADINE 10 MG PO TABS
10.0000 mg | ORAL_TABLET | Freq: Every day | ORAL | Status: DC
  Administered 2014-06-27 – 2014-06-28 (×2): 10 mg via ORAL
  Filled 2014-06-27 (×2): qty 1

## 2014-06-27 MED ORDER — PAROXETINE HCL 20 MG PO TABS
40.0000 mg | ORAL_TABLET | ORAL | Status: DC
  Administered 2014-06-28: 40 mg via ORAL
  Filled 2014-06-27: qty 2

## 2014-06-27 MED ORDER — LISINOPRIL 40 MG PO TABS
40.0000 mg | ORAL_TABLET | Freq: Every day | ORAL | Status: DC
  Administered 2014-06-28: 40 mg via ORAL
  Filled 2014-06-27 (×2): qty 1

## 2014-06-27 MED ORDER — MELOXICAM 7.5 MG PO TABS
15.0000 mg | ORAL_TABLET | Freq: Every day | ORAL | Status: DC
  Administered 2014-06-27 – 2014-06-28 (×2): 15 mg via ORAL
  Filled 2014-06-27 (×2): qty 2

## 2014-06-27 MED ORDER — PANTOPRAZOLE SODIUM 40 MG PO TBEC
40.0000 mg | DELAYED_RELEASE_TABLET | Freq: Every day | ORAL | Status: DC
  Administered 2014-06-27 – 2014-06-28 (×2): 40 mg via ORAL
  Filled 2014-06-27 (×2): qty 1

## 2014-06-27 MED ORDER — INSULIN GLARGINE 100 UNIT/ML ~~LOC~~ SOLN
60.0000 [IU] | Freq: Every day | SUBCUTANEOUS | Status: DC
  Administered 2014-06-27: 60 [IU] via SUBCUTANEOUS
  Filled 2014-06-27: qty 0.6

## 2014-06-27 MED ORDER — INSULIN ASPART 100 UNIT/ML ~~LOC~~ SOLN: 25.0000 [IU] | Freq: Three times a day (TID) | SUBCUTANEOUS | Status: DC

## 2014-06-27 MED ORDER — INSULIN GLARGINE 100 UNIT/ML ~~LOC~~ SOLN
50.0000 [IU] | Freq: Every day | SUBCUTANEOUS | Status: DC
  Filled 2014-06-27: qty 0.5

## 2014-06-27 MED ORDER — ALBUTEROL SULFATE (2.5 MG/3ML) 0.083% IN NEBU: 2.5000 mg | INHALATION_SOLUTION | RESPIRATORY_TRACT | Status: DC | PRN

## 2014-06-27 MED ORDER — GABAPENTIN 300 MG PO CAPS
600.0000 mg | ORAL_CAPSULE | Freq: Two times a day (BID) | ORAL | Status: DC
  Administered 2014-06-27 – 2014-06-28 (×2): 600 mg via ORAL
  Filled 2014-06-27 (×2): qty 2

## 2014-06-27 MED ORDER — CALCIUM CARBONATE-VITAMIN D 500-200 MG-UNIT PO TABS
1.0000 | ORAL_TABLET | Freq: Every day | ORAL | Status: DC
  Administered 2014-06-27 – 2014-06-28 (×2): 1 via ORAL
  Filled 2014-06-27 (×3): qty 1

## 2014-06-27 MED ORDER — LIRAGLUTIDE 18 MG/3ML ~~LOC~~ SOPN: 1.8000 [IU] | PEN_INJECTOR | Freq: Every morning | SUBCUTANEOUS | Status: DC

## 2014-06-27 MED ORDER — ONDANSETRON HCL 4 MG/2ML IJ SOLN: 4.0000 mg | Freq: Four times a day (QID) | INTRAMUSCULAR | Status: DC | PRN

## 2014-06-27 MED ORDER — SIMVASTATIN 40 MG PO TABS
40.0000 mg | ORAL_TABLET | Freq: Every evening | ORAL | Status: DC
  Administered 2014-06-27: 40 mg via ORAL
  Filled 2014-06-27: qty 1

## 2014-06-27 MED ORDER — HYDROCOD POLST-CHLORPHEN POLST 10-8 MG/5ML PO LQCR: 5.0000 mL | Freq: Two times a day (BID) | ORAL | Status: DC | PRN

## 2014-06-27 MED ORDER — ADULT MULTIVITAMIN W/MINERALS CH
1.0000 | ORAL_TABLET | Freq: Every day | ORAL | Status: DC
  Administered 2014-06-27 – 2014-06-28 (×2): 1 via ORAL
  Filled 2014-06-27 (×3): qty 1

## 2014-06-27 MED ORDER — INSULIN ASPART 100 UNIT/ML ~~LOC~~ SOLN: 0.0000 [IU] | Freq: Three times a day (TID) | SUBCUTANEOUS | Status: DC

## 2014-06-27 NOTE — ED Provider Notes (Signed)
TIME SEEN: 4:10 PM  CHIEF COMPLAINT: Chest pain, nausea, palpitations  HPI: Pt is a 68 y.o. F with history of hypertension, insulin-dependent diabetes, hyperlipidemia, CHF who presents to the emergency department with intermittent nausea for the past 2 weeks and an episode of substernal chest pressure that started around 2:30 PM and lasted for several minutes while standing at work. Patient is a nurse at an endoscopy suite. She states she began having palpitations and had an irregular heart rate. When she sat down and was hooked onto the monitor she was having PACs. Denies a prior history of arrhythmia. Denies any associated shortness of breath, diaphoresis or dizziness. She did feel very nauseous to her and this episode. She states she has had a stress test but has been 3 years ago with Dr. Candee Furbish.  States she has had a cardiac catheterization 12 years ago that showed 10% lesion in one vessel but was otherwise normal. 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  Denies any aggravating or relieving factors. Denies any abdominal pain, diarrhea. No fevers or chills. No history of PE or DVT. No history of MI.  ROS: See HPI Constitutional: no fever  Eyes: no drainage  ENT: no runny nose   Cardiovascular:   chest pain  Resp: no SOB  GI: no vomiting GU: no dysuria Integumentary: no rash  Allergy: no hives  Musculoskeletal: no leg swelling  Neurological: no slurred speech ROS otherwise negative  PAST MEDICAL HISTORY/PAST SURGICAL HISTORY:  Past Medical History  Diagnosis Date  . Diabetes mellitus   . Hypertension   . CHF (congestive heart failure)   . Asthma   . Osteoarthritis   . Anxiety   . GERD (gastroesophageal reflux disease)   . Kidney stones   . Hypercholesteremia   . Thrombophlebitis     MEDICATIONS:  Prior to Admission medications   Medication Sig Start Date End Date Taking? Authorizing Provider  albuterol (PROVENTIL HFA;VENTOLIN HFA) 108 (90 BASE)  MCG/ACT inhaler Inhale 2 puffs into the lungs every 4 (four) hours as needed. For shortness of breath and wheezing     Historical Provider, MD  amLODipine (NORVASC) 5 MG tablet Take 5 mg by mouth daily.      Historical Provider, MD  aspirin EC 81 MG tablet Take 81 mg by mouth daily.      Historical Provider, MD  Calcium Carbonate-Vitamin D (CALTRATE 600+D) 600-400 MG-UNIT per tablet Take 1 tablet by mouth daily.      Historical Provider, MD  chlorpheniramine-HYDROcodone (TUSSIONEX PENNKINETIC ER) 10-8 MG/5ML LQCR Take 5 mLs by mouth every 12 (twelve) hours as needed. 07/20/11   Glendell Docker, NP  dexlansoprazole (DEXILANT) 60 MG capsule Take 60 mg by mouth daily.      Historical Provider, MD  dextromethorphan-guaiFENesin (MUCINEX DM) 30-600 MG per 12 hr tablet Take 1 tablet by mouth every 12 (twelve) hours.      Historical Provider, MD  ferrous sulfate 325 (65 FE) MG tablet Take 325 mg by mouth 2 (two) times daily.     Historical Provider, MD  fish oil-omega-3 fatty acids 1000 MG capsule Take 1 g by mouth daily.      Historical Provider, MD  furosemide (LASIX) 40 MG tablet Take 40 mg by mouth 2 (two) times daily.      Historical Provider, MD  gabapentin (NEURONTIN) 300 MG capsule Take 600 mg by mouth 2 (two) times daily.     Historical Provider, MD  glucose blood test  strip 1 each by Other route as needed for other. Use as instructed    Historical Provider, MD  ibuprofen (ADVIL,MOTRIN) 200 MG tablet Take 800 mg by mouth every 6 (six) hours as needed. For pain      Historical Provider, MD  insulin aspart (NOVOLOG FLEXPEN) 100 UNIT/ML injection Inject 25 Units into the skin 3 (three) times daily before meals. Sliding Scale      Historical Provider, MD  insulin glargine (LANTUS SOLOSTAR) 100 UNIT/ML injection Inject 50-60 Units into the skin 2 (two) times daily. Give 50 units in the morning and 60 units at bedtime      Historical Provider, MD  Liraglutide (VICTOZA [place]) Inject 1.8 Units into the skin  every morning.    Historical Provider, MD  lisinopril-hydrochlorothiazide (PRINZIDE,ZESTORETIC) 20-12.5 MG per tablet Take 2 tablets by mouth daily.     Historical Provider, MD  loratadine (CLARITIN) 10 MG tablet Take 10 mg by mouth daily.      Historical Provider, MD  meloxicam (MOBIC) 15 MG tablet Take 15 mg by mouth daily.      Historical Provider, MD  Metoclopramide HCl (METOZOLV ODT) 5 MG TBDP Take 1 tablet by mouth 4 (four) times daily -  before meals and at bedtime.      Historical Provider, MD  Multiple Vitamin (MULITIVITAMIN WITH MINERALS) TABS Take 1 tablet by mouth daily.      Historical Provider, MD  PARoxetine (PAXIL) 40 MG tablet Take 40 mg by mouth every morning.      Historical Provider, MD  Polyethyl Glycol-Propyl Glycol (LUBRICANT EYE DROPS) 0.4-0.3 % SOLN Place 1 drop into both eyes 3 (three) times daily as needed. For eye irritation    Historical Provider, MD  simvastatin (ZOCOR) 40 MG tablet Take 40 mg by mouth every evening.      Historical Provider, MD    ALLERGIES:  Allergies  Allergen Reactions  . Crestor [Rosuvastatin]   . Indomethacin     dizziness  . Invanz [Ertapenem Sodium] Hives  . Lipitor [Atorvastatin Calcium]     Myalgias     SOCIAL HISTORY:  History  Substance Use Topics  . Smoking status: Never Smoker   . Smokeless tobacco: Not on file  . Alcohol Use: Yes    FAMILY HISTORY: History reviewed. No pertinent family history.  EXAM: BP 133/50 mmHg  Pulse 76  Temp(Src) 97.9 F (36.6 C) (Oral)  Resp 21  SpO2 99% CONSTITUTIONAL: Alert and oriented and responds appropriately to questions. Well-appearing; well-nourished HEAD: Normocephalic EYES: Conjunctivae clear, PERRL ENT: normal nose; no rhinorrhea; moist mucous membranes; pharynx without lesions noted NECK: Supple, no meningismus, no LAD  CARD: RRR; S1 and S2 appreciated; no murmurs, no clicks, no rubs, no gallops RESP: Normal chest excursion without splinting or tachypnea; breath sounds  clear and equal bilaterally; no wheezes, no rhonchi, no rales, no hypoxia or respiratory distress ABD/GI: Normal bowel sounds; non-distended; soft, non-tender, no rebound, no guarding BACK:  The back appears normal and is non-tender to palpation, there is no CVA tenderness EXT: Normal ROM in all joints; non-tender to palpation; no edema; normal capillary refill; no cyanosis; no calf tenderness or swelling  SKIN: Normal color for age and race; warm NEURO: Moves all extremities equally PSYCH: The patient's mood and manner are appropriate. Grooming and personal hygiene are appropriate.  MEDICAL DECISION MAKING: Patient here with episode of chest pain, palpitations and nausea. She does have multiple risk factors for ACS. EKG shows first-degree AV block but no  other ischemic changes. No arrhythmia. Hemodynamically stable and asymptomatic currently. Received aspirin and Zofran at her work.  We'll obtain cardiac labs, chest x-ray. Have recommended an observation admission for ACS rule out and she agrees. PCP is Dr. [name].  ED PROGRESS: Patient's labs are unremarkable other than mild leukocytosis with left shift and mild elevation of her creatinine. She does have a slight elevation of her lipase but no abdominal pain on exam. LFTs normal. Troponin negative. Chest x-ray clear. Still chest pain-free. Hemodynamically stable. We'll discuss with hospitalist for admission for ACS rule out.    6:34 PM  Spoke with Dr. Chancy Milroy for admission to tele, obs bed.  Will place holding orders.    EKG Interpretation  Date/Time:  Tuesday June 27 2014 15:40:38 EST Ventricular Rate:  77 PR Interval:  234 QRS Duration: 85 QT Interval:  380 QTC Calculation: 430 R Axis:   56 Text Interpretation:  Sinus rhythm Prolonged PR interval Confirmed by WARD,  DO, KRISTEN [id number]) on 06/27/2014 4:10:29 PM        [place], DO 06/27/14 [id number]

## 2014-06-27 NOTE — ED Notes (Signed)
Pt presents to ED via EMS from [facility] where pt works with c/o chest pressure associated with nausea. Pt reports that she felts intermittent pressure, states she has had nausea for x2 weeks. EMS established left A/C 20G line and given 4mg  zofran. Pt also taken 324mg  ASA prior to EMS arrival. Per EMS, BP-156/76, HR-82, RR-18, O2-100% on 2L [place], CBG-255. Pt alerts and oriented x4 at this time, airway intact. Pt denies chest pain at this time.

## 2014-06-27 NOTE — H&P (Signed)
[facility] History and Physical  Sharon [name] LDJ:[id number] DOB: 26-Mar-1946 DOA: 06/27/2014  Referring physician: Cyril Mourning Ward DO PCP: Osborne Casco, MD   Chief Complaint: Chest Pressure  HPI: Sharon [name] is a 68 y.o. female presents with nausea and palpitations. She states this has been going on for about 2 weeks. Patient states that she had checked her pulse and she thought she was having atrial fibrillation. She states her heart was irregular. She states that she did have some chest pressure noted. She had no radiation. She states had no SOB noted. She had no headaches noted. Patient states that she has not had any passing out spell either. Patient states that she has no fevers or chills noted. She did take aspirin prior to coming to the ED. She is now at baseline   Review of Systems:  Constitutional:  No weight loss, night sweats, Fevers, chills, fatigue.  HEENT:  No headaches, Difficulty swallowing,Tooth/dental problems,Sore throat,  No sneezing Cardio-vascular:  No chest pain, ++chest pressure Occasional Orthopnea, PND, ++swelling in lower extremities GI:  No heartburn, indigestion, abdominal pain, ++nausea, ++vomiting, no diarrhea, change in bowel habits Resp:  No shortness of breath with exertion or at rest. No excess mucus, no productive cough, No non-productive cough, No coughing up of blood Skin:  no rash or lesions GU:  no dysuria, change in color of urine, no urgency or frequency Musculoskeletal:  No joint pain or swelling. No decreased range of motion Psych:  No change in mood or affect. No depression or anxiety  Past Medical History  Diagnosis Date  . Diabetes mellitus   . Hypertension   . CHF (congestive heart failure)   . Asthma   . Osteoarthritis   . Anxiety   . GERD (gastroesophageal reflux disease)   . Kidney stones   . Hypercholesteremia   . Thrombophlebitis    Past Surgical History  Procedure Laterality Date  . Appendectomy     . Cholecystectomy    . Breast surgery    . Abdominal hysterectomy    . Carpal tunnel release    . Incision and drainage perirectal abscess    . Colonoscopy    . Esophagoscopy    . Pyelogram     Social History:  reports that she has never smoked. She does not have any smokeless tobacco history on file. She reports that she drinks alcohol. She reports that she does not use illicit drugs.  Allergies  Allergen Reactions  . Crestor [Rosuvastatin]   . Indomethacin     dizziness  . Invanz [Ertapenem Sodium] Hives  . Lipitor [Atorvastatin Calcium]     Myalgias     History reviewed. No pertinent family history.   Prior to Admission medications   Medication Sig Start Date End Date Taking? Authorizing Provider  albuterol (PROVENTIL HFA;VENTOLIN HFA) 108 (90 BASE) MCG/ACT inhaler Inhale 2 puffs into the lungs every 4 (four) hours as needed. For shortness of breath and wheezing     Historical Provider, MD  amLODipine (NORVASC) 5 MG tablet Take 5 mg by mouth daily.      Historical Provider, MD  aspirin EC 81 MG tablet Take 81 mg by mouth daily.      Historical Provider, MD  Calcium Carbonate-Vitamin D (CALTRATE 600+D) 600-400 MG-UNIT per tablet Take 1 tablet by mouth daily.      Historical Provider, MD  chlorpheniramine-HYDROcodone (TUSSIONEX PENNKINETIC ER) 10-8 MG/5ML LQCR Take 5 mLs by mouth every 12 (twelve) hours as needed. 07/20/11   Tamala Julian  [place], NP  dexlansoprazole (DEXILANT) 60 MG capsule Take 60 mg by mouth daily.      Historical Provider, MD  dextromethorphan-guaiFENesin (MUCINEX DM) 30-600 MG per 12 hr tablet Take 1 tablet by mouth every 12 (twelve) hours.      Historical Provider, MD  ferrous sulfate 325 (65 FE) MG tablet Take 325 mg by mouth 2 (two) times daily.     Historical Provider, MD  fish oil-omega-3 fatty acids 1000 MG capsule Take 1 g by mouth daily.      Historical Provider, MD  furosemide (LASIX) 40 MG tablet Take 40 mg by mouth 2 (two) times daily.      Historical  Provider, MD  gabapentin (NEURONTIN) 300 MG capsule Take 600 mg by mouth 2 (two) times daily.     Historical Provider, MD  glucose blood test strip 1 each by Other route as needed for other. Use as instructed    Historical Provider, MD  ibuprofen (ADVIL,MOTRIN) 200 MG tablet Take 800 mg by mouth every 6 (six) hours as needed. For pain      Historical Provider, MD  insulin aspart (NOVOLOG FLEXPEN) 100 UNIT/ML injection Inject 25 Units into the skin 3 (three) times daily before meals. Sliding Scale      Historical Provider, MD  insulin glargine (LANTUS SOLOSTAR) 100 UNIT/ML injection Inject 50-60 Units into the skin 2 (two) times daily. Give 50 units in the morning and 60 units at bedtime      Historical Provider, MD  Liraglutide (VICTOZA [place]) Inject 1.8 Units into the skin every morning.    Historical Provider, MD  lisinopril-hydrochlorothiazide (PRINZIDE,ZESTORETIC) 20-12.5 MG per tablet Take 2 tablets by mouth daily.     Historical Provider, MD  loratadine (CLARITIN) 10 MG tablet Take 10 mg by mouth daily.      Historical Provider, MD  meloxicam (MOBIC) 15 MG tablet Take 15 mg by mouth daily.      Historical Provider, MD  Metoclopramide HCl (METOZOLV ODT) 5 MG TBDP Take 1 tablet by mouth 4 (four) times daily -  before meals and at bedtime.      Historical Provider, MD  Multiple Vitamin (MULITIVITAMIN WITH MINERALS) TABS Take 1 tablet by mouth daily.      Historical Provider, MD  PARoxetine (PAXIL) 40 MG tablet Take 40 mg by mouth every morning.      Historical Provider, MD  Polyethyl Glycol-Propyl Glycol (LUBRICANT EYE DROPS) 0.4-0.3 % SOLN Place 1 drop into both eyes 3 (three) times daily as needed. For eye irritation    Historical Provider, MD  simvastatin (ZOCOR) 40 MG tablet Take 40 mg by mouth every evening.      Historical Provider, MD   Physical Exam: Filed Vitals:   06/27/14 1639 06/27/14 1700 06/27/14 1730 06/27/14 1800  BP: 136/60 148/59 145/58 124/47  Pulse: 79 76 73 68  Temp:        TempSrc:      Resp: 18 18 20 19   SpO2: 100% 99% 100% 98%    Wt Readings from Last 3 Encounters:  05/07/14 127.007 kg (280 lb)  07/20/11 141.069 kg (311 lb)    General:  Appears calm and comfortable Eyes: PERRL, normal lids, irises & conjunctiva ENT: grossly normal hearing, lips & tongue Neck: no LAD, masses or thyromegaly Cardiovascular: RRR, no m/r/g. ++LE edema Respiratory: CTA bilaterally, no w/r/r Abdomen: soft, ntnd Skin: no rash or induration seen on limited exam Musculoskeletal: grossly normal tone BUE/BLE LE wrapped with ace bandage Psychiatric:  grossly normal mood and affect Neurologic: grossly non-focal.          Labs on Admission:  Basic Metabolic Panel:  Recent Labs Lab 06/27/14 1633  NA 137  K 3.9  CL 101  CO2 19  GLUCOSE 160*  BUN 23  CREATININE 1.13*  CALCIUM 9.7   Liver Function Tests:  Recent Labs Lab 06/27/14 1633  AST 14  ALT 16  ALKPHOS 124*  BILITOT 0.2*  PROT 8.0  ALBUMIN 3.7    Recent Labs Lab 06/27/14 1633  LIPASE 69*   No results for input(s): AMMONIA in the last 168 hours. CBC:  Recent Labs Lab 06/27/14 1633  WBC 13.0*  NEUTROABS 9.7*  HGB 11.7*  HCT 35.5*  MCV 89.4  PLT 307   Cardiac Enzymes: No results for input(s): CKTOTAL, CKMB, CKMBINDEX, TROPONINI in the last 168 hours.  BNP (last 3 results) No results for input(s): PROBNP in the last 8760 hours. CBG: No results for input(s): GLUCAP in the last 168 hours.  Radiological Exams on Admission: Dg Chest 2 View  06/27/2014   CLINICAL DATA:  Chest pain and nausea for 1 day  EXAM: CHEST  2 VIEW  COMPARISON:  07/20/2011  FINDINGS: The heart size and mediastinal contours are within normal limits. Both lungs are clear. Spondylosis is noted within the thoracic spine.  IMPRESSION: No active cardiopulmonary disease.   Electronically Signed   By: Kerby Moors M.D.   On: 06/27/2014 18:12     Assessment/Plan Principal Problem:   Chest pain Active Problems:    Diabetes mellitus   Hypertension   Asthma   1. Chest Pressure -will admit for observation -check serial enzymes -will monitor for irregular rhythym -will check TSH  2. Diabetes Mellitus -will continue with insulin -sliding scale coverage -A1C will be ordered -dietary instruction  3. Hypertension -will continue with home medications -monitor pressures  4. Asthma -will continue with home inhalers -she states that it is primarily exercise induced   Code Status: Full Code (must indicate code status--if unknown or must be presumed, indicate so) DVT Prophylaxis:Heparin Family Communication: None (indicate person spoken with, if applicable, with phone number if by telephone) Disposition Plan: Home (indicate anticipated LOS)  Time spent: 37min  Caliah Kopke A [facility] Pager [phone]

## 2014-06-27 NOTE — ED Notes (Signed)
I gave the patient a cup of ice and a diet coke.

## 2014-06-28 DIAGNOSIS — R079 Chest pain, unspecified: Secondary | ICD-10-CM | POA: Diagnosis not present

## 2014-06-28 DIAGNOSIS — E119 Type 2 diabetes mellitus without complications: Secondary | ICD-10-CM | POA: Diagnosis not present

## 2014-06-28 DIAGNOSIS — J45909 Unspecified asthma, uncomplicated: Secondary | ICD-10-CM | POA: Diagnosis not present

## 2014-06-28 DIAGNOSIS — I10 Essential (primary) hypertension: Secondary | ICD-10-CM | POA: Diagnosis not present

## 2014-06-28 DIAGNOSIS — R0789 Other chest pain: Secondary | ICD-10-CM | POA: Diagnosis not present

## 2014-06-28 LAB — GLUCOSE, CAPILLARY: Glucose-Capillary: 73 mg/dL (ref 70–99)

## 2014-06-28 LAB — HEMOGLOBIN A1C
Hgb A1c MFr Bld: 7.2 % — ABNORMAL HIGH (ref <5.7)
Mean Plasma Glucose: 160 mg/dL — ABNORMAL HIGH (ref <117)

## 2014-06-28 LAB — TROPONIN I: Troponin I: <0.3 ng/mL (ref <0.30)

## 2014-06-28 MED ORDER — INSULIN GLARGINE 100 UNIT/ML ~~LOC~~ SOLN
60.0000 [IU] | Freq: Two times a day (BID) | SUBCUTANEOUS | Status: DC
Start: 2014-06-28 — End: 2014-06-28
  Administered 2014-06-28: 60 [IU] via SUBCUTANEOUS
  Filled 2014-06-28 (×2): qty 0.6

## 2014-06-28 NOTE — Discharge Summary (Signed)
Physician Discharge Summary  Sharon [name] XHB:[id number] DOB: 1945/07/23 DOA: 06/27/2014  PCP: Osborne Casco, MD  Admit date: 06/27/2014 Discharge date: 06/28/2014  Time spent: 35 minutes  Recommendations for Outpatient Follow-up:  1. Follow up with cardiology for outpatient stress test  Discharge Diagnoses:  Principal Problem:   Chest pain Active Problems:   Diabetes mellitus   Hypertension   Asthma   Discharge Condition: stable  Diet recommendation: heart healthy  Filed Weights   06/27/14 2056 06/28/14 0537  Weight: 117.845 kg (259 lb 12.8 oz) 117.754 kg (259 lb 9.6 oz)    History of present illness:  68 y.o. female presents with nausea and palpitations. She states this has been going on for about 2 weeks. Patient states that she had checked her pulse and she thought she was having atrial fibrillation. She states her heart was irregular. She states that she did have some chest pressure noted. She had no radiation. She states had no SOB noted. She had no headaches noted. Patient states that she has not had any passing out spell either. Patient states that she has no fevers or chills noted. She did take aspirin prior to coming to the ED. She is now at baseline  Hospital Course: Atypical Chest Pressure -Cardiac markers negative x 3. - no events  On telemetry. - EKG 1st degree AV block. No T wave abnormalities.  Diabetes Mellitus - will continue with insulin - sliding scale coverage - A1C 7.2, follow up with PCP.  Essential Hypertension - will continue with home medications - no changes were made.  Asthma - no changes made.  Procedures:  CXR  Consultations:  cardiology  Discharge Exam: Filed Vitals:   06/28/14 0817  BP: 107/53  Pulse: 65  Temp: 98.4 F (36.9 C)  Resp: 16    General: A&O x3 Cardiovascular: RRR Respiratory: good air movement CTA B/L  Discharge Instructions You were cared for by a hospitalist during your hospital stay.  If you have any questions about your discharge medications or the care you received while you were in the hospital after you are discharged, you can call the unit and asked to speak with the hospitalist on call if the hospitalist that took care of you is not available. Once you are discharged, your primary care physician will handle any further medical issues. Please note that NO REFILLS for any discharge medications will be authorized once you are discharged, as it is imperative that you return to your primary care physician (or establish a relationship with a primary care physician if you do not have one) for your aftercare needs so that they can reassess your need for medications and monitor your lab values.  Discharge Instructions    Diet - low sodium heart healthy    Complete by:  As directed      Increase activity slowly    Complete by:  As directed           Current Discharge Medication List    CONTINUE these medications which have NOT CHANGED   Details  aspirin EC 81 MG tablet Take 81 mg by mouth daily.      Calcium Carbonate-Vitamin D (CALTRATE 600+D) 600-400 MG-UNIT per tablet Take 1 tablet by mouth daily.      dexlansoprazole (DEXILANT) 60 MG capsule Take 60 mg by mouth daily.      dextromethorphan-guaiFENesin (MUCINEX DM) 30-600 MG per 12 hr tablet Take 1 tablet by mouth daily.     ferrous sulfate 325 (65 FE)  MG tablet Take 325 mg by mouth 2 (two) times daily.     fish oil-omega-3 fatty acids 1000 MG capsule Take 1 g by mouth daily.      furosemide (LASIX) 40 MG tablet Take 40 mg by mouth 2 (two) times daily.      gabapentin (NEURONTIN) 300 MG capsule Take 600 mg by mouth 2 (two) times daily.     ibuprofen (ADVIL,MOTRIN) 200 MG tablet Take 800 mg by mouth every 6 (six) hours as needed for mild pain. For pain     insulin glargine (LANTUS SOLOSTAR) 100 UNIT/ML injection Inject 60 Units into the skin 2 (two) times daily.     Liraglutide (VICTOZA [place]) Inject 1.8 Units into  the skin every morning.    lisinopril-hydrochlorothiazide (PRINZIDE,ZESTORETIC) 20-12.5 MG per tablet Take 2 tablets by mouth daily.     loratadine (CLARITIN) 10 MG tablet Take 10 mg by mouth daily.      meloxicam (MOBIC) 15 MG tablet Take 15 mg by mouth daily.      Multiple Vitamin (MULITIVITAMIN WITH MINERALS) TABS Take 1 tablet by mouth daily.      ondansetron (ZOFRAN) 4 MG tablet Take 4 mg by mouth 2 (two) times daily.  Refills: 0    PARoxetine (PAXIL) 40 MG tablet Take 40 mg by mouth daily.     simvastatin (ZOCOR) 40 MG tablet Take 40 mg by mouth every evening.      glucose blood test strip 1 each by Other route as needed for other. Use as instructed      STOP taking these medications     albuterol (PROVENTIL HFA;VENTOLIN HFA) 108 (90 BASE) MCG/ACT inhaler      chlorpheniramine-HYDROcodone (TUSSIONEX PENNKINETIC ER) 10-8 MG/5ML LQCR        Allergies  Allergen Reactions  . Crestor [Rosuvastatin] Other (See Comments)    Myalgias  . Indomethacin Other (See Comments)    dizziness  . Invanz [Ertapenem Sodium] Hives  . Lipitor [Atorvastatin Calcium] Other (See Comments)    Myalgias       The results of significant diagnostics from this hospitalization (including imaging, microbiology, ancillary and laboratory) are listed below for reference.    Significant Diagnostic Studies: Dg Chest 2 View  06/27/2014   CLINICAL DATA:  Chest pain and nausea for 1 day  EXAM: CHEST  2 VIEW  COMPARISON:  07/20/2011  FINDINGS: The heart size and mediastinal contours are within normal limits. Both lungs are clear. Spondylosis is noted within the thoracic spine.  IMPRESSION: No active cardiopulmonary disease.   Electronically Signed   By: Kerby Moors M.D.   On: 06/27/2014 18:12    Microbiology: No results found for this or any previous visit (from the past 240 hour(s)).   Labs: Basic Metabolic Panel:  Recent Labs Lab 06/27/14 1633  NA 137  K 3.9  CL 101  CO2 19  GLUCOSE  160*  BUN 23  CREATININE 1.13*  CALCIUM 9.7   Liver Function Tests:  Recent Labs Lab 06/27/14 1633  AST 14  ALT 16  ALKPHOS 124*  BILITOT 0.2*  PROT 8.0  ALBUMIN 3.7    Recent Labs Lab 06/27/14 1633  LIPASE 69*   No results for input(s): AMMONIA in the last 168 hours. CBC:  Recent Labs Lab 06/27/14 1633  WBC 13.0*  NEUTROABS 9.7*  HGB 11.7*  HCT 35.5*  MCV 89.4  PLT 307   Cardiac Enzymes:  Recent Labs Lab 06/27/14 2155 06/27/14 2345 06/28/14 0232  TROPONINI <  0.30 <0.30 <0.30   BNP: BNP (last 3 results) No results for input(s): PROBNP in the last 8760 hours. CBG:  Recent Labs Lab 06/27/14 2050 06/28/14 0748  GLUCAP 90 73       Signed:  FELIZ ORTIZ, ABRAHAM  [facility] 06/28/2014, 10:05 AM

## 2014-07-19 ENCOUNTER — Encounter: Payer: Self-pay | Admitting: Cardiology

## 2014-07-19 ENCOUNTER — Ambulatory Visit (INDEPENDENT_AMBULATORY_CARE_PROVIDER_SITE_OTHER): Payer: BLUE CROSS/BLUE SHIELD | Admitting: Cardiology

## 2014-07-19 VITALS — BP 102/62 | HR 69 | Ht 59.5 in | Wt 259.0 lb

## 2014-07-19 DIAGNOSIS — E669 Obesity, unspecified: Secondary | ICD-10-CM

## 2014-07-19 DIAGNOSIS — I491 Atrial premature depolarization: Secondary | ICD-10-CM

## 2014-07-19 DIAGNOSIS — I10 Essential (primary) hypertension: Secondary | ICD-10-CM | POA: Diagnosis not present

## 2014-07-19 DIAGNOSIS — R079 Chest pain, unspecified: Secondary | ICD-10-CM

## 2014-07-19 MED ORDER — METOPROLOL SUCCINATE ER 25 MG PO TB24: 25.0000 mg | ORAL_TABLET | Freq: Every day | ORAL | Status: DC

## 2014-07-19 NOTE — Patient Instructions (Signed)
Your physician has recommended you make the following change in your medication:  1) START Metoprolol Succinate 25mg  daily. An Rx has been sent to your pharmacy  Your physician has requested that you have a lexiscan myoview. For further information please visit [email or web address]. Please follow instruction sheet, as given.  Your physician wants you to follow-up in: 6 months with Dr.Skains You will receive a reminder letter in the mail two months in advance. If you don't receive a letter, please call our office to schedule the follow-up appointment.

## 2014-07-19 NOTE — Progress Notes (Signed)
[facility]. [address] [place], [place]  [address] Phone: [phone] Fax:  [phone]  Date:  07/19/2014   ID:  Sharon [name], DOB [birth year], MRN [id number]  PCP:  Osborne Casco, MD   History of Present Illness: Sharon [name] is a 69 y.o. female here for evaluation of chest pain. She was in the emergency department on 06/28/14 with nausea, palpitations which were present for approximally 2 weeks. She checked her pulse and thought she was in atrial fibrillation, heart was irregular. She felt chest pressure with no radiation. No shortness of breath. No fevers or chills. No fainting. Since she works at [employer], she was able to monitor her heart with telemetry. I have personally reviewed some of the strips that she brought with her and they are sinus rhythm with PACs.  In emergency department, first-degree AV block was noted on EKG but no T-wave abnormalities. Cardiac markers were negative. She does have diabetes with a hemoglobin A1c of 7.2. Hypertension as well.  Since that experience, she has not had any further episodes of chest discomfort. She has generalized fatigue she states.   Wt Readings from Last 3 Encounters:  07/19/14 259 lb (117.482 kg)  06/28/14 259 lb 9.6 oz (117.754 kg)  05/07/14 280 lb (127.007 kg)     Past Medical History  Diagnosis Date  . Diabetes mellitus   . Hypertension   . CHF (congestive heart failure)   . Asthma   . Osteoarthritis   . Anxiety   . GERD (gastroesophageal reflux disease)   . Kidney stones   . Hypercholesteremia   . Thrombophlebitis     Past Surgical History  Procedure Laterality Date  . Appendectomy    . Cholecystectomy    . Breast surgery    . Abdominal hysterectomy    . Carpal tunnel release    . Incision and drainage perirectal abscess    . Colonoscopy    . Esophagoscopy    . Pyelogram      Current Outpatient Prescriptions  Medication Sig Dispense Refill  . aspirin EC 81 MG tablet Take 81  mg by mouth daily.      . Calcium Carbonate-Vitamin D (CALTRATE 600+D) 600-400 MG-UNIT per tablet Take 1 tablet by mouth 2 (two) times daily.     Marland Kitchen dexlansoprazole (DEXILANT) 60 MG capsule Take 60 mg by mouth daily.      Marland Kitchen Dextromethorphan-Guaifenesin 60-1200 MG per 12 hr tablet Take 1 tablet by mouth 2 (two) times daily as needed.    . ferrous sulfate 325 (65 FE) MG tablet Take 325 mg by mouth 2 (two) times daily.     . fish oil-omega-3 fatty acids 1000 MG capsule Take 1 g by mouth daily.      . furosemide (LASIX) 40 MG tablet Take 40 mg by mouth 2 (two) times daily.      Marland Kitchen gabapentin (NEURONTIN) 300 MG capsule Take 600 mg by mouth 2 (two) times daily.     Marland Kitchen glucose blood test strip 1 each by Other route as needed for other. Use as instructed    . ibuprofen (ADVIL,MOTRIN) 200 MG tablet Take 800 mg by mouth every 6 (six) hours as needed for mild pain. For pain     . insulin glargine (LANTUS SOLOSTAR) 100 UNIT/ML injection Inject 60 Units into the skin 2 (two) times daily.     . Liraglutide (VICTOZA [place]) Inject 1.8 Units into the skin every evening.     Marland Kitchen  lisinopril-hydrochlorothiazide (PRINZIDE,ZESTORETIC) 20-12.5 MG per tablet Take 2 tablets by mouth daily.     Marland Kitchen loratadine (CLARITIN) 10 MG tablet Take 10 mg by mouth daily.      . meloxicam (MOBIC) 15 MG tablet Take 15 mg by mouth daily.      . Multiple Vitamin (MULITIVITAMIN WITH MINERALS) TABS Take 1 tablet by mouth 2 (two) times daily.     . ondansetron (ZOFRAN) 4 MG tablet Take 4 mg by mouth 2 (two) times daily. 4-8mg  prn  0  . PARoxetine (PAXIL) 40 MG tablet Take 60 mg by mouth every morning.    . simvastatin (ZOCOR) 40 MG tablet Take 40 mg by mouth every morning.      No current facility-administered medications for this visit.    Allergies:    Allergies  Allergen Reactions  . Crestor [Rosuvastatin] Other (See Comments)    Myalgias  . Indomethacin Other (See Comments)    dizziness  . Invanz [Ertapenem Sodium] Hives  . Lipitor  [Atorvastatin Calcium] Other (See Comments)    Myalgias     Social History:  The patient  reports that she has never smoked. She does not have any smokeless tobacco history on file. She reports that she drinks alcohol. She reports that she does not use illicit drugs.   No family history on file.  ROS:  Please see the history of present illness.   Denies any fevers, chills, orthopnea, PND, bleeding, syncope, strokelike symptoms. Positive generalized fatigue, palpitations.   All other systems reviewed and negative.   PHYSICAL EXAM: VS:  BP 102/62 mmHg  Pulse 69  Ht 4' 11.5" (1.511 m)  Wt 259 lb (117.482 kg)  BMI 51.46 kg/m2 Well nourished, well developed, in no acute distress HEENT: normal, [place]/AT, EOMI Neck: no JVD, normal carotid upstroke, no bruit Cardiac:  normal S1, S2; RRR; 2/6 systolic right upper sternal border murmur Lungs:  clear to auscultation bilaterally, no wheezing, rhonchi or rales Abd: soft, nontender, no hepatomegaly, no bruits Ext: no edema, 2+ distal pulses Skin: warm and dry GU: deferred Neuro: no focal abnormalities noted, AAO x 3  EKG:  07/19/14-sinus rhythm, first-degree AV block, heart rate 69, otherwise normal.  ECHO:   04/28/13: Normal EF, mild AS  ASSESSMENT AND PLAN:  1. Chest pain-episode occurred during anxiety/stress, no radiation. Was concerning enough however to go to the emergency room, concerned her partner, Dr. Oletta Lamas. It is been a proximally 5 years since her last stress test which was reassuring. Given her diabetes and recent episode of chest discomfort, we will proceed with nuclear stress test. 2. Palpitations-she had the fortune to check her telemetry strips at work and they are demonstrating PACs only. Evidence of atrial fibrillation. I do not feel strongly that she needs a Holter monitor at this time. We will continue to monitor clinically. I will prescribe her low-dose Toprol 25 mg once a day. If she is having a rough episodes she may take an  extra metoprolol. 3. Obesity-encourage weight loss. 4. Mild aortic stenosis-should be of no clinical consequence at this time. 5. Six-month follow-up. She knows to call if any worrisome symptoms develop.  Signed, Candee Furbish, MD [facility]  07/19/2014 12:31 PM

## 2014-07-24 ENCOUNTER — Ambulatory Visit (HOSPITAL_COMMUNITY): Payer: BLUE CROSS/BLUE SHIELD | Attending: Cardiology | Admitting: Radiology

## 2014-07-24 DIAGNOSIS — I491 Atrial premature depolarization: Secondary | ICD-10-CM

## 2014-07-24 DIAGNOSIS — J45909 Unspecified asthma, uncomplicated: Secondary | ICD-10-CM | POA: Insufficient documentation

## 2014-07-24 DIAGNOSIS — I10 Essential (primary) hypertension: Secondary | ICD-10-CM | POA: Diagnosis not present

## 2014-07-24 DIAGNOSIS — R079 Chest pain, unspecified: Secondary | ICD-10-CM | POA: Diagnosis not present

## 2014-07-24 MED ORDER — REGADENOSON 0.4 MG/5ML IV SOLN
0.4000 mg | Freq: Once | INTRAVENOUS | Status: AC
  Administered 2014-07-24: 0.4 mg via INTRAVENOUS

## 2014-07-24 MED ORDER — TECHNETIUM TC 99M SESTAMIBI GENERIC - CARDIOLITE
30.0000 | Freq: Once | INTRAVENOUS | Status: AC | PRN
  Administered 2014-07-24: 30 via INTRAVENOUS

## 2014-07-24 NOTE — Progress Notes (Signed)
[facility] [address] [place], [place] [address] [phone]    Cardiology Nuclear Med Study  Sharon [name] is a 69 y.o. female     MRN : [id number]     DOB: April 17, 1946  Procedure Date: 07/24/2014  Nuclear Med Background Indication for Stress Test:  Evaluation for Ischemia and [facility]: 12/15 ED Palpitation, CP,(-) enzymes History:  Asthma and Mild AS Cardiac Risk Factors: Hypertension  Symptoms:  Chest Pain   Nuclear Pre-Procedure Caffeine/Decaff Intake:  None NPO After: 5 pm   Lungs:  clear O2 Sat: 97% on room air. IV 0.9% NS with Angio Cath:  22g  IV Site: L Antecubital  IV Started by:  Perrin Maltese, EMT-P  Chest Size (in):  52 Cup Size: C  Height: 4' 11.5" (1.511 m)  Weight:  254 lb (115.214 kg)  BMI:  Body mass index is 50.46 kg/(m^2). Tech Comments:  CBG 119 mg/dl this am    Nuclear Med Study 1 or 2 day study: 2 day  Stress Test Type:  Carlton Adam  Reading MD: Jenkins Rouge, MD  Order Authorizing Provider:  H.Smith MD  Resting Radionuclide: Technetium 80m Sestamibi  Resting Radionuclide Dose: 33.0 on 08/03/2014 mCi   Stress Radionuclide:  Technetium 61m Sestamibi  Stress Radionuclide Dose: 33.0 on 07/24/2014 mCi           Stress Protocol Rest HR: 60 Stress HR: 77  Rest BP: 92/52 Stress BP: 113/70  Exercise Time (min): n/a METS: n/a   Predicted Max HR: 154 bpm % Max HR: 50 bpm Rate Pressure Product: 8701   Dose of Adenosine (mg):  n/a Dose of Lexiscan: 0.4 mg  Dose of Atropine (mg): n/a Dose of Dobutamine: n/a mcg/kg/min (at max HR)  Stress Test Technologist: Perrin Maltese, EMT-P  Nuclear Technologist:  Earl Many, CNMT     Rest Procedure:  Myocardial perfusion imaging was performed at rest 45 minutes following the intravenous administration of Technetium 2m Sestamibi. Rest ECG:  NSR 63 bpm  Stress Procedure:  The patient received IV Lexiscan 0.4 mg over 15-seconds.  Technetium 38m Sestamibi injected at  30-seconds. This patient had sob and felt funny with the Lexiscan injection. Quantitative spect images were obtained after a 45 minute delay. Stress ECG: No significant change from baseline ECG  QPS Raw Data Images: Soft tissue (diaphragm, bowel activity) underlie heart.   Stress Images:  Normal homogeneous uptake in all areas of the myocardium. Rest Images:  Normal homogeneous uptake in all areas of the myocardium. Subtraction (SDS):  No evidence of ischemia. Transient Ischemic Dilatation (Normal <1.22):  0.79 Lung/Heart Ratio (Normal <0.45):  0.26  Quantitative Gated Spect Images QGS EDV:  68 ml QGS ESV:  19 ml  Impression Exercise Capacity:  Lexiscan with no exercise. BP Response:  Normal blood pressure response. Clinical Symptoms:  No chest pain. ECG Impression:  No significant ST segment change suggestive of ischemia. Comparison with Prior Nuclear Study:   No prior study    Overall Impression:  Normal stress nuclear study.  LV Ejection Fraction: 72%.  LV Wall Motion:  NL LV Function; NL Wall Motion   Dorris Carnes

## 2014-07-26 ENCOUNTER — Institutional Professional Consult (permissible substitution): Payer: Medicare Other | Admitting: Cardiovascular Disease

## 2014-08-03 ENCOUNTER — Ambulatory Visit (HOSPITAL_COMMUNITY): Payer: BLUE CROSS/BLUE SHIELD | Attending: Cardiology

## 2014-08-03 DIAGNOSIS — R0989 Other specified symptoms and signs involving the circulatory and respiratory systems: Secondary | ICD-10-CM

## 2014-08-03 MED ORDER — TECHNETIUM TC 99M SESTAMIBI GENERIC - CARDIOLITE
33.0000 | Freq: Once | INTRAVENOUS | Status: AC | PRN
  Administered 2014-08-03: 33 via INTRAVENOUS

## 2014-08-17 DIAGNOSIS — H7291 Unspecified perforation of tympanic membrane, right ear: Secondary | ICD-10-CM | POA: Diagnosis not present

## 2014-08-17 DIAGNOSIS — J342 Deviated nasal septum: Secondary | ICD-10-CM | POA: Diagnosis not present

## 2014-08-30 DIAGNOSIS — E780 Pure hypercholesterolemia: Secondary | ICD-10-CM | POA: Diagnosis not present

## 2014-08-30 DIAGNOSIS — D509 Iron deficiency anemia, unspecified: Secondary | ICD-10-CM | POA: Diagnosis not present

## 2014-08-30 DIAGNOSIS — E669 Obesity, unspecified: Secondary | ICD-10-CM | POA: Diagnosis not present

## 2014-08-30 DIAGNOSIS — N183 Chronic kidney disease, stage 3 (moderate): Secondary | ICD-10-CM | POA: Diagnosis not present

## 2014-08-30 DIAGNOSIS — E1121 Type 2 diabetes mellitus with diabetic nephropathy: Secondary | ICD-10-CM | POA: Diagnosis not present

## 2014-08-30 DIAGNOSIS — I129 Hypertensive chronic kidney disease with stage 1 through stage 4 chronic kidney disease, or unspecified chronic kidney disease: Secondary | ICD-10-CM | POA: Diagnosis not present

## 2014-08-30 DIAGNOSIS — I110 Hypertensive heart disease with heart failure: Secondary | ICD-10-CM | POA: Diagnosis not present

## 2014-08-30 DIAGNOSIS — L409 Psoriasis, unspecified: Secondary | ICD-10-CM | POA: Diagnosis not present

## 2014-09-27 DIAGNOSIS — R197 Diarrhea, unspecified: Secondary | ICD-10-CM | POA: Diagnosis not present

## 2014-09-27 DIAGNOSIS — E1142 Type 2 diabetes mellitus with diabetic polyneuropathy: Secondary | ICD-10-CM | POA: Diagnosis not present

## 2014-11-01 DIAGNOSIS — H04123 Dry eye syndrome of bilateral lacrimal glands: Secondary | ICD-10-CM | POA: Diagnosis not present

## 2014-11-01 DIAGNOSIS — Z961 Presence of intraocular lens: Secondary | ICD-10-CM | POA: Diagnosis not present

## 2014-11-01 DIAGNOSIS — E119 Type 2 diabetes mellitus without complications: Secondary | ICD-10-CM | POA: Diagnosis not present

## 2014-11-01 DIAGNOSIS — H1859 Other hereditary corneal dystrophies: Secondary | ICD-10-CM | POA: Diagnosis not present

## 2014-11-01 DIAGNOSIS — H5000 Unspecified esotropia: Secondary | ICD-10-CM | POA: Diagnosis not present

## 2014-11-01 DIAGNOSIS — D3131 Benign neoplasm of right choroid: Secondary | ICD-10-CM | POA: Diagnosis not present

## 2015-01-10 DIAGNOSIS — E1165 Type 2 diabetes mellitus with hyperglycemia: Secondary | ICD-10-CM | POA: Diagnosis not present

## 2015-01-10 DIAGNOSIS — Z794 Long term (current) use of insulin: Secondary | ICD-10-CM | POA: Diagnosis not present

## 2015-01-10 DIAGNOSIS — E1142 Type 2 diabetes mellitus with diabetic polyneuropathy: Secondary | ICD-10-CM | POA: Diagnosis not present

## 2015-01-10 DIAGNOSIS — Z6841 Body Mass Index (BMI) 40.0 and over, adult: Secondary | ICD-10-CM | POA: Diagnosis not present

## 2015-02-02 ENCOUNTER — Other Ambulatory Visit: Payer: Self-pay | Admitting: Family Medicine

## 2015-02-02 DIAGNOSIS — Z1231 Encounter for screening mammogram for malignant neoplasm of breast: Secondary | ICD-10-CM

## 2015-03-12 ENCOUNTER — Ambulatory Visit: Payer: BC Managed Care – PPO

## 2015-03-15 DIAGNOSIS — M7062 Trochanteric bursitis, left hip: Secondary | ICD-10-CM | POA: Diagnosis not present

## 2015-03-28 ENCOUNTER — Ambulatory Visit: Payer: BC Managed Care – PPO | Admitting: Cardiology

## 2015-04-12 ENCOUNTER — Encounter: Payer: Self-pay | Admitting: Cardiology

## 2015-04-12 ENCOUNTER — Ambulatory Visit (INDEPENDENT_AMBULATORY_CARE_PROVIDER_SITE_OTHER): Payer: BLUE CROSS/BLUE SHIELD | Admitting: Cardiology

## 2015-04-12 VITALS — BP 130/78 | HR 68 | Ht 59.5 in | Wt 274.4 lb

## 2015-04-12 DIAGNOSIS — E119 Type 2 diabetes mellitus without complications: Secondary | ICD-10-CM | POA: Diagnosis not present

## 2015-04-12 DIAGNOSIS — E1165 Type 2 diabetes mellitus with hyperglycemia: Secondary | ICD-10-CM | POA: Diagnosis not present

## 2015-04-12 DIAGNOSIS — I350 Nonrheumatic aortic (valve) stenosis: Secondary | ICD-10-CM | POA: Diagnosis not present

## 2015-04-12 DIAGNOSIS — Z794 Long term (current) use of insulin: Secondary | ICD-10-CM | POA: Diagnosis not present

## 2015-04-12 DIAGNOSIS — R002 Palpitations: Secondary | ICD-10-CM | POA: Diagnosis not present

## 2015-04-12 DIAGNOSIS — E1142 Type 2 diabetes mellitus with diabetic polyneuropathy: Secondary | ICD-10-CM | POA: Diagnosis not present

## 2015-04-12 NOTE — Progress Notes (Signed)
[facility] [address], [address] [place], [place]  [address] Phone: [phone] Fax:  [phone]  Date:  04/12/2015   ID:  Sharon [name], DOB 1945/11/11, MRN [id number]  PCP:  Osborne Casco, MD   History of Present Illness: Sharon [name] is a 69 y.o. female here for evaluation of chest pain. She was in the emergency department on 06/28/14 with nausea, palpitations which were present for approximally 2 weeks. She checked her pulse and thought she was in atrial fibrillation, heart was irregular. She felt chest pressure with no radiation. No shortness of breath. No fevers or chills. No fainting. Since she works at [employer], she was able to monitor her heart with telemetry. I have personally reviewed some of the strips that she brought with her and they are sinus rhythm with PACs.  In emergency department, first-degree AV block was noted on EKG but no T-wave abnormalities. Cardiac markers were negative. She does have diabetes with a hemoglobin A1c of 7.2. Hypertension as well.  Since that experience, she has not had any further episodes of chest discomfort. Nuclear stress test was reassuring. She has generalized fatigue she states.  Since taking the Toprol, she has been doing quite well with her PACs, palpitations.   Wt Readings from Last 3 Encounters:  04/12/15 274 lb 6.4 oz (124.467 kg)  07/24/14 254 lb (115.214 kg)  07/19/14 259 lb (117.482 kg)     Past Medical History  Diagnosis Date  . Diabetes mellitus   . Hypertension   . CHF (congestive heart failure)   . Asthma   . Osteoarthritis   . Anxiety   . GERD (gastroesophageal reflux disease)   . Kidney stones   . Hypercholesteremia   . Thrombophlebitis     Past Surgical History  Procedure Laterality Date  . Appendectomy    . Cholecystectomy    . Breast surgery    . Abdominal hysterectomy    . Carpal tunnel release    . Incision and drainage perirectal abscess    . Colonoscopy    . Esophagoscopy      . Pyelogram      Current Outpatient Prescriptions  Medication Sig Dispense Refill  . aspirin EC 81 MG tablet Take 81 mg by mouth daily.      . Calcium Carbonate-Vitamin D (CALTRATE 600+D) 600-400 MG-UNIT per tablet Take 1 tablet by mouth 2 (two) times daily.     Marland Kitchen dexlansoprazole (DEXILANT) 60 MG capsule Take 60 mg by mouth daily.      Marland Kitchen Dextromethorphan-Guaifenesin 60-1200 MG per 12 hr tablet Take 1 tablet by mouth 2 (two) times daily as needed.    . fish oil-omega-3 fatty acids 1000 MG capsule Take 1 g by mouth daily.      . furosemide (LASIX) 40 MG tablet Take 40 mg by mouth 2 (two) times daily.      Marland Kitchen gabapentin (NEURONTIN) 300 MG capsule Take 600 mg by mouth 2 (two) times daily.     Marland Kitchen glucose blood test strip 1 each by Other route as needed for other. Use as instructed    . ibuprofen (ADVIL,MOTRIN) 200 MG tablet Take 800 mg by mouth every 6 (six) hours as needed for mild pain. For pain     . Insulin Degludec (TRESIBA FLEXTOUCH [place]) Inject 120 Units into the skin daily.    Marland Kitchen lisinopril-hydrochlorothiazide (PRINZIDE,ZESTORETIC) 20-12.5 MG per tablet Take 2 tablets by mouth daily.     Marland Kitchen loratadine (CLARITIN) 10  MG tablet Take 10 mg by mouth daily.      . meloxicam (MOBIC) 15 MG tablet Take 15 mg by mouth daily.      . metoprolol succinate (TOPROL XL) 25 MG 24 hr tablet Take 1 tablet (25 mg total) by mouth daily. 30 tablet 11  . Multiple Vitamin (MULITIVITAMIN WITH MINERALS) TABS Take 1 tablet by mouth 2 (two) times daily.     . ondansetron (ZOFRAN) 4 MG tablet Take 4 mg by mouth 2 (two) times daily. 4-8mg  prn  0  . PARoxetine (PAXIL) 40 MG tablet Take 60 mg by mouth every morning.    . simvastatin (ZOCOR) 40 MG tablet Take 40 mg by mouth every morning.     . sitaGLIPtin (JANUVIA) 50 MG tablet Take 50 mg by mouth daily.     No current facility-administered medications for this visit.    Allergies:    Allergies  Allergen Reactions  . Crestor [Rosuvastatin] Other (See Comments)     Myalgias  . Indomethacin Other (See Comments)    dizziness  . Invanz [Ertapenem Sodium] Hives  . Lipitor [Atorvastatin Calcium] Other (See Comments)    Myalgias     Social History:  The patient  reports that she has never smoked. She does not have any smokeless tobacco history on file. She reports that she drinks alcohol. She reports that she does not use illicit drugs.   Family History  Problem Relation Age of Onset  . Heart attack Mother     8 HEART ATTACKS  . Stroke Mother     3 STROKES  . Heart attack Father   . Stroke Father     ROS:  Please see the history of present illness.   Denies any fevers, chills, orthopnea, PND, bleeding, syncope, strokelike symptoms. Positive generalized fatigue, palpitations.   All other systems reviewed and negative.   PHYSICAL EXAM: VS:  BP 130/78 mmHg  Pulse 68  Ht 4' 11.5" (1.511 m)  Wt 274 lb 6.4 oz (124.467 kg)  BMI 54.52 kg/m2  SpO2 96% Well nourished, well developed, in no acute distress HEENT: normal, [place]/AT, EOMI Neck: no JVD, normal carotid upstroke, no bruit Cardiac:  normal S1, S2; RRR; 2/6 systolic right upper sternal border murmur Lungs:  clear to auscultation bilaterally, no wheezing, rhonchi or rales Abd: soft, nontender, no hepatomegaly, no bruits Ext: no edema, 2+ distal pulses Skin: warm and dry GU: deferred Neuro: no focal abnormalities noted, AAO x 3  EKG:  07/19/14-sinus rhythm, first-degree AV block, heart rate 69, otherwise normal.  ECHO:   04/28/13: Normal EF, mild AS NUC: 07/24/14 - Low risk, normal EF. No ischemia.   ASSESSMENT AND PLAN:  1. Chest pain-nuclear stress test was reassuring in 2016.episode occurred during anxiety/stress, no radiation. Was concerning enough however to go to the emergency room, concerned her partner, Dr. Oletta Lamas. 2. Palpitations-she had the fortune to check her telemetry strips at work and they are demonstrating PACs only. Evidence of atrial fibrillation. I do not feel strongly that  she needs a Holter monitor at this time. We will continue to monitor clinically. Toprol 25 mg once a day doing well. If she is having a rough episodes she may take an extra metoprolol. 3. Morbid Obesity-encourage weight loss. Left hip bursitis, helped significantly by injection. 4. Mild aortic stenosis-should be of no clinical consequence at this time. We will check echo down the road. 5. Diabetes-Dr. Buddy Duty 6. 42-month follow-up. She knows to call if any worrisome symptoms develop.  Signed, Candee Furbish, MD [facility]  04/12/2015 2:03 PM

## 2015-04-12 NOTE — Patient Instructions (Signed)
Medication Instructions:  The current medical regimen is effective;  continue present plan and medications.  Follow-Up: Follow up in 1 year with Dr. Skains.  You will receive a letter in the mail 2 months before you are due.  Please call us when you receive this letter to schedule your follow up appointment.  Thank you for choosing [facility]!!     

## 2015-04-16 ENCOUNTER — Ambulatory Visit
Admission: RE | Admit: 2015-04-16 | Discharge: 2015-04-16 | Disposition: A | Discharge status: Home / Self Care | Payer: BLUE CROSS/BLUE SHIELD | Source: Ambulatory Visit | Attending: Family Medicine | Admitting: Family Medicine

## 2015-04-16 DIAGNOSIS — Z1231 Encounter for screening mammogram for malignant neoplasm of breast: Secondary | ICD-10-CM

## 2015-04-23 ENCOUNTER — Ambulatory Visit: Payer: Medicare Other | Admitting: Cardiology

## 2015-05-15 ENCOUNTER — Other Ambulatory Visit: Payer: Self-pay | Admitting: Gastroenterology

## 2015-05-15 DIAGNOSIS — R194 Change in bowel habit: Secondary | ICD-10-CM | POA: Diagnosis not present

## 2015-05-15 DIAGNOSIS — R1084 Generalized abdominal pain: Secondary | ICD-10-CM | POA: Diagnosis not present

## 2015-05-17 ENCOUNTER — Ambulatory Visit
Admission: RE | Admit: 2015-05-17 | Discharge: 2015-05-17 | Disposition: A | Discharge status: Home / Self Care | Payer: Medicare Other | Source: Ambulatory Visit | Attending: Gastroenterology | Admitting: Gastroenterology

## 2015-05-17 DIAGNOSIS — R1012 Left upper quadrant pain: Secondary | ICD-10-CM | POA: Diagnosis not present

## 2015-05-17 DIAGNOSIS — R1084 Generalized abdominal pain: Secondary | ICD-10-CM

## 2015-05-17 DIAGNOSIS — R140 Abdominal distension (gaseous): Secondary | ICD-10-CM | POA: Diagnosis not present

## 2015-05-17 DIAGNOSIS — R194 Change in bowel habit: Secondary | ICD-10-CM

## 2015-05-17 DIAGNOSIS — R110 Nausea: Secondary | ICD-10-CM | POA: Diagnosis not present

## 2015-05-17 MED ORDER — IOPAMIDOL (ISOVUE-300) INJECTION 61%
125.0000 mL | Freq: Once | INTRAVENOUS | Status: AC | PRN
  Administered 2015-05-17: 125 mL via INTRAVENOUS

## 2015-05-25 ENCOUNTER — Other Ambulatory Visit: Payer: BLUE CROSS/BLUE SHIELD

## 2015-05-27 ENCOUNTER — Emergency Department (HOSPITAL_COMMUNITY): Payer: BLUE CROSS/BLUE SHIELD

## 2015-05-27 ENCOUNTER — Inpatient Hospital Stay (HOSPITAL_COMMUNITY)
Admission: EM | Admit: 2015-05-27 | Discharge: 2015-06-07 | DRG: 391 | Disposition: A | Discharge status: Home / Self Care | Payer: BLUE CROSS/BLUE SHIELD | Attending: Internal Medicine | Admitting: Internal Medicine

## 2015-05-27 ENCOUNTER — Encounter (HOSPITAL_COMMUNITY): Payer: Self-pay

## 2015-05-27 DIAGNOSIS — K5730 Diverticulosis of large intestine without perforation or abscess without bleeding: Secondary | ICD-10-CM | POA: Diagnosis not present

## 2015-05-27 DIAGNOSIS — E1143 Type 2 diabetes mellitus with diabetic autonomic (poly)neuropathy: Secondary | ICD-10-CM | POA: Diagnosis not present

## 2015-05-27 DIAGNOSIS — R112 Nausea with vomiting, unspecified: Secondary | ICD-10-CM | POA: Diagnosis not present

## 2015-05-27 DIAGNOSIS — Z823 Family history of stroke: Secondary | ICD-10-CM

## 2015-05-27 DIAGNOSIS — G4733 Obstructive sleep apnea (adult) (pediatric): Secondary | ICD-10-CM | POA: Diagnosis present

## 2015-05-27 DIAGNOSIS — R197 Diarrhea, unspecified: Secondary | ICD-10-CM | POA: Diagnosis not present

## 2015-05-27 DIAGNOSIS — K5792 Diverticulitis of intestine, part unspecified, without perforation or abscess without bleeding: Secondary | ICD-10-CM | POA: Diagnosis present

## 2015-05-27 DIAGNOSIS — I5032 Chronic diastolic (congestive) heart failure: Secondary | ICD-10-CM | POA: Diagnosis not present

## 2015-05-27 DIAGNOSIS — I5033 Acute on chronic diastolic (congestive) heart failure: Secondary | ICD-10-CM | POA: Diagnosis not present

## 2015-05-27 DIAGNOSIS — E785 Hyperlipidemia, unspecified: Secondary | ICD-10-CM | POA: Diagnosis present

## 2015-05-27 DIAGNOSIS — Z7982 Long term (current) use of aspirin: Secondary | ICD-10-CM

## 2015-05-27 DIAGNOSIS — Z794 Long term (current) use of insulin: Secondary | ICD-10-CM

## 2015-05-27 DIAGNOSIS — M1990 Unspecified osteoarthritis, unspecified site: Secondary | ICD-10-CM | POA: Diagnosis present

## 2015-05-27 DIAGNOSIS — K5641 Fecal impaction: Secondary | ICD-10-CM

## 2015-05-27 DIAGNOSIS — K8689 Other specified diseases of pancreas: Secondary | ICD-10-CM | POA: Diagnosis present

## 2015-05-27 DIAGNOSIS — K5660 Unspecified intestinal obstruction: Secondary | ICD-10-CM | POA: Diagnosis not present

## 2015-05-27 DIAGNOSIS — K5732 Diverticulitis of large intestine without perforation or abscess without bleeding: Principal | ICD-10-CM | POA: Diagnosis present

## 2015-05-27 DIAGNOSIS — I10 Essential (primary) hypertension: Secondary | ICD-10-CM | POA: Diagnosis present

## 2015-05-27 DIAGNOSIS — D649 Anemia, unspecified: Secondary | ICD-10-CM | POA: Diagnosis present

## 2015-05-27 DIAGNOSIS — E876 Hypokalemia: Secondary | ICD-10-CM | POA: Diagnosis present

## 2015-05-27 DIAGNOSIS — K3184 Gastroparesis: Secondary | ICD-10-CM | POA: Diagnosis not present

## 2015-05-27 DIAGNOSIS — Z8249 Family history of ischemic heart disease and other diseases of the circulatory system: Secondary | ICD-10-CM

## 2015-05-27 DIAGNOSIS — R0600 Dyspnea, unspecified: Secondary | ICD-10-CM

## 2015-05-27 DIAGNOSIS — R109 Unspecified abdominal pain: Secondary | ICD-10-CM | POA: Diagnosis not present

## 2015-05-27 DIAGNOSIS — Z791 Long term (current) use of non-steroidal anti-inflammatories (NSAID): Secondary | ICD-10-CM

## 2015-05-27 DIAGNOSIS — Z9119 Patient's noncompliance with other medical treatment and regimen: Secondary | ICD-10-CM

## 2015-05-27 DIAGNOSIS — F410 Panic disorder [episodic paroxysmal anxiety] without agoraphobia: Secondary | ICD-10-CM | POA: Diagnosis not present

## 2015-05-27 DIAGNOSIS — E119 Type 2 diabetes mellitus without complications: Secondary | ICD-10-CM

## 2015-05-27 LAB — COMPREHENSIVE METABOLIC PANEL
ALBUMIN: 3.1 g/dL — AB (ref 3.5–5.0)
ALT: 13 U/L — ABNORMAL LOW (ref 14–54)
ANION GAP: 8 (ref 5–15)
AST: 14 U/L — ABNORMAL LOW (ref 15–41)
Alkaline Phosphatase: 77 U/L (ref 38–126)
BILIRUBIN TOTAL: 0.6 mg/dL (ref 0.3–1.2)
BUN: 6 mg/dL (ref 6–20)
CO2: 27 mmol/L (ref 22–32)
Calcium: 9 mg/dL (ref 8.9–10.3)
Chloride: 107 mmol/L (ref 101–111)
Creatinine, Ser: 0.68 mg/dL (ref 0.44–1.00)
Glucose, Bld: 117 mg/dL — ABNORMAL HIGH (ref 65–99)
POTASSIUM: 2.7 mmol/L — AB (ref 3.5–5.1)
Sodium: 142 mmol/L (ref 135–145)
TOTAL PROTEIN: 6.5 g/dL (ref 6.5–8.1)

## 2015-05-27 LAB — CBC
HEMATOCRIT: 33 % — AB (ref 36.0–46.0)
HEMOGLOBIN: 10.5 g/dL — AB (ref 12.0–15.0)
MCH: 27.9 pg (ref 26.0–34.0)
MCHC: 31.8 g/dL (ref 30.0–36.0)
MCV: 87.5 fL (ref 78.0–100.0)
Platelets: 322 10*3/uL (ref 150–400)
RBC: 3.77 MIL/uL — AB (ref 3.87–5.11)
RDW: 13.6 % (ref 11.5–15.5)
WBC: 10.9 10*3/uL — AB (ref 4.0–10.5)

## 2015-05-27 LAB — LIPASE, BLOOD: LIPASE: 20 U/L (ref 11–51)

## 2015-05-27 MED ORDER — POTASSIUM CHLORIDE 10 MEQ/100ML IV SOLN
10.0000 meq | Freq: Once | INTRAVENOUS | Status: AC
  Administered 2015-05-27: 10 meq via INTRAVENOUS
  Filled 2015-05-27: qty 100

## 2015-05-27 MED ORDER — IOHEXOL 300 MG/ML  SOLN
100.0000 mL | Freq: Once | INTRAMUSCULAR | Status: AC | PRN
  Administered 2015-05-27: 100 mL via INTRAVENOUS

## 2015-05-27 MED ORDER — ONDANSETRON 4 MG PO TBDP
4.0000 mg | ORAL_TABLET | Freq: Once | ORAL | Status: AC | PRN
  Administered 2015-05-27: 4 mg via ORAL

## 2015-05-27 MED ORDER — SODIUM CHLORIDE 0.9 % IV BOLUS (SEPSIS)
500.0000 mL | Freq: Once | INTRAVENOUS | Status: AC
  Administered 2015-05-27: 500 mL via INTRAVENOUS

## 2015-05-27 MED ORDER — ONDANSETRON HCL 4 MG/2ML IJ SOLN
4.0000 mg | Freq: Once | INTRAMUSCULAR | Status: AC
  Administered 2015-05-27: 4 mg via INTRAVENOUS
  Filled 2015-05-27: qty 2

## 2015-05-27 MED ORDER — PROMETHAZINE HCL 25 MG/ML IJ SOLN
12.5000 mg | Freq: Once | INTRAMUSCULAR | Status: AC
  Administered 2015-05-27: 12.5 mg via INTRAVENOUS
  Filled 2015-05-27: qty 1

## 2015-05-27 MED ORDER — MORPHINE SULFATE (PF) 4 MG/ML IV SOLN
4.0000 mg | Freq: Once | INTRAVENOUS | Status: AC
  Administered 2015-05-27: 4 mg via INTRAVENOUS
  Filled 2015-05-27: qty 1

## 2015-05-27 MED ORDER — IOHEXOL 300 MG/ML  SOLN
50.0000 mL | INTRAMUSCULAR | Status: AC
  Administered 2015-05-27: 25 mL via ORAL

## 2015-05-27 MED ORDER — ONDANSETRON 4 MG PO TBDP
ORAL_TABLET | ORAL | Status: AC
  Filled 2015-05-27: qty 1

## 2015-05-27 MED ORDER — METOCLOPRAMIDE HCL 5 MG/ML IJ SOLN
10.0000 mg | Freq: Once | INTRAMUSCULAR | Status: DC
  Filled 2015-05-27: qty 2

## 2015-05-27 NOTE — ED Notes (Signed)
[place], [place] notified of the pt's bought of SOB.

## 2015-05-27 NOTE — ED Provider Notes (Signed)
CSN: [id number]     Arrival date & time 05/27/15  2013 History   First MD Initiated Contact with Patient 05/27/15 2031     Chief Complaint  Patient presents with  . Abdominal Pain  . Emesis     (Consider location/radiation/quality/duration/timing/severity/associated sxs/prior Treatment) Patient is a 69 y.o. female presenting with abdominal pain and vomiting. The history is provided by the patient. No language interpreter was used.  Abdominal Pain Pain location:  LLQ Pain quality: cramping and sharp   Pain radiates to:  Does not radiate Pain severity:  Moderate Associated symptoms: nausea and vomiting   Associated symptoms: no chest pain, no chills, no diarrhea, no fever and no shortness of breath   Associated symptoms comment:  The patient was diagnosed by CT abd/pel on 05/18/15 with diverticulitis. Symptoms at that time were LLQ abdominal pain and difficulty moving her bowels. She was seen by Dr. Watt Climes, diagnosed with mild sigmoid diverticulitis and put on Augmentin. She reports an uncomplicated course, feeling better through the week until going to dinner 05/25/15 and woke up 11/12 (yesterday) with increased LLQ pain, nausea and vomiting. No fever at any time. She denies bloody stools or emesis. She has not been able to tolerate anything by mouth since yesterday.  Emesis Associated symptoms: abdominal pain   Associated symptoms: no chills, no diarrhea and no myalgias     Past Medical History  Diagnosis Date  . Diabetes mellitus   . Hypertension   . CHF (congestive heart failure) ([place])   . Asthma   . Osteoarthritis   . Anxiety   . GERD (gastroesophageal reflux disease)   . Kidney stones   . Hypercholesteremia   . Thrombophlebitis    Past Surgical History  Procedure Laterality Date  . Appendectomy    . Cholecystectomy    . Breast surgery    . Abdominal hysterectomy    . Carpal tunnel release    . Incision and drainage perirectal abscess    . Colonoscopy    .  Esophagoscopy    . Pyelogram     Family History  Problem Relation Age of Onset  . Heart attack Mother     8 HEART ATTACKS  . Stroke Mother     3 STROKES  . Heart attack Father   . Stroke Father    Social History  Substance Use Topics  . Smoking status: Never Smoker   . Smokeless tobacco: None  . Alcohol Use: Yes   OB History    No data available     Review of Systems  Constitutional: Negative for fever and chills.  Respiratory: Negative.  Negative for shortness of breath.   Cardiovascular: Negative.  Negative for chest pain.  Gastrointestinal: Positive for nausea, vomiting and abdominal pain. Negative for diarrhea and blood in stool.  Musculoskeletal: Negative.  Negative for myalgias and back pain.  Skin: Negative.   Neurological: Negative.       Allergies  Crestor; Indomethacin; Invanz; and Lipitor  Home Medications   Prior to Admission medications   Medication Sig Start Date End Date Taking? Authorizing Provider  aspirin EC 81 MG tablet Take 81 mg by mouth daily.      Historical Provider, MD  Calcium Carbonate-Vitamin D (CALTRATE 600+D) 600-400 MG-UNIT per tablet Take 1 tablet by mouth 2 (two) times daily.     Historical Provider, MD  dexlansoprazole (DEXILANT) 60 MG capsule Take 60 mg by mouth daily.      Historical Provider, MD  Dextromethorphan-Guaifenesin 60-1200  MG per 12 hr tablet Take 1 tablet by mouth 2 (two) times daily as needed.    Historical Provider, MD  fish oil-omega-3 fatty acids 1000 MG capsule Take 1 g by mouth daily.      Historical Provider, MD  furosemide (LASIX) 40 MG tablet Take 40 mg by mouth 2 (two) times daily.      Historical Provider, MD  gabapentin (NEURONTIN) 300 MG capsule Take 600 mg by mouth 2 (two) times daily.     Historical Provider, MD  glucose blood test strip 1 each by Other route as needed for other. Use as instructed    Historical Provider, MD  ibuprofen (ADVIL,MOTRIN) 200 MG tablet Take 800 mg by mouth every 6 (six) hours as  needed for mild pain. For pain     Historical Provider, MD  Insulin Degludec (TRESIBA FLEXTOUCH [place]) Inject 120 Units into the skin daily.    Historical Provider, MD  lisinopril-hydrochlorothiazide (PRINZIDE,ZESTORETIC) 20-12.5 MG per tablet Take 2 tablets by mouth daily.     Historical Provider, MD  loratadine (CLARITIN) 10 MG tablet Take 10 mg by mouth daily.      Historical Provider, MD  meloxicam (MOBIC) 15 MG tablet Take 15 mg by mouth daily.      Historical Provider, MD  metoprolol succinate (TOPROL XL) 25 MG 24 hr tablet Take 1 tablet (25 mg total) by mouth daily. 07/19/14   Jerline Pain, MD  Multiple Vitamin (MULITIVITAMIN WITH MINERALS) TABS Take 1 tablet by mouth 2 (two) times daily.     Historical Provider, MD  ondansetron (ZOFRAN) 4 MG tablet Take 4 mg by mouth 2 (two) times daily. 4-8mg  prn 06/12/14   Historical Provider, MD  PARoxetine (PAXIL) 40 MG tablet Take 60 mg by mouth every morning.    Historical Provider, MD  simvastatin (ZOCOR) 40 MG tablet Take 40 mg by mouth every morning.     Historical Provider, MD  sitaGLIPtin (JANUVIA) 50 MG tablet Take 50 mg by mouth daily.    Historical Provider, MD   BP 187/70 mmHg  Pulse 60  Temp(Src) 98.5 F (36.9 C) (Oral)  Resp 20  SpO2 94% Physical Exam  Constitutional: She is oriented to person, place, and time. She appears well-developed and well-nourished.  HENT:  Head: Normocephalic.  Neck: Normal range of motion. Neck supple.  Cardiovascular: Normal rate and regular rhythm.   Pulmonary/Chest: Effort normal and breath sounds normal.  Abdominal: Soft. There is tenderness. There is no rebound and no guarding.  Left sided abdominal tenderness, greater in LLQ.  Musculoskeletal: Normal range of motion.  Neurological: She is alert and oriented to person, place, and time.  Skin: Skin is warm and dry. No rash noted.  Psychiatric: She has a normal mood and affect.    ED Course  Procedures (including critical care time) Labs  Review Labs Reviewed  COMPREHENSIVE METABOLIC PANEL - Abnormal; Notable for the following:    Potassium 2.7 (*)    Glucose, Bld 117 (*)    Albumin 3.1 (*)    AST 14 (*)    ALT 13 (*)    All other components within normal limits  CBC - Abnormal; Notable for the following:    WBC 10.9 (*)    RBC 3.77 (*)    Hemoglobin 10.5 (*)    HCT 33.0 (*)    All other components within normal limits  LIPASE, BLOOD  URINALYSIS, ROUTINE W REFLEX MICROSCOPIC (NOT AT [facility])   Results for orders placed  or performed during the hospital encounter of 05/27/15  Lipase, blood  Result Value Ref Range   Lipase 20 11 - 51 U/L  Comprehensive metabolic panel  Result Value Ref Range   Sodium 142 135 - 145 mmol/L   Potassium 2.7 (LL) 3.5 - 5.1 mmol/L   Chloride 107 101 - 111 mmol/L   CO2 27 22 - 32 mmol/L   Glucose, Bld 117 (H) 65 - 99 mg/dL   BUN 6 6 - 20 mg/dL   Creatinine, Ser 0.68 0.44 - 1.00 mg/dL   Calcium 9.0 8.9 - 10.3 mg/dL   Total Protein 6.5 6.5 - 8.1 g/dL   Albumin 3.1 (L) 3.5 - 5.0 g/dL   AST 14 (L) 15 - 41 U/L   ALT 13 (L) 14 - 54 U/L   Alkaline Phosphatase 77 38 - 126 U/L   Total Bilirubin 0.6 0.3 - 1.2 mg/dL   GFR calc non Af Amer >60 >60 mL/min   GFR calc Af Amer >60 >60 mL/min   Anion gap 8 5 - 15  CBC  Result Value Ref Range   WBC 10.9 (H) 4.0 - 10.5 K/uL   RBC 3.77 (L) 3.87 - 5.11 MIL/uL   Hemoglobin 10.5 (L) 12.0 - 15.0 g/dL   HCT 33.0 (L) 36.0 - 46.0 %   MCV 87.5 78.0 - 100.0 fL   MCH 27.9 26.0 - 34.0 pg   MCHC 31.8 30.0 - 36.0 g/dL   RDW 13.6 11.5 - 15.5 %   Platelets 322 150 - 400 K/uL  Ct Abdomen Pelvis W Contrast  05/27/2015  CLINICAL DATA:  Vomiting, nausea, and diarrhea. Recent diagnosis of diverticulitis post 7 days of antibiotics. Diverticulitis versus developed abscess. EXAM: CT ABDOMEN AND PELVIS WITH CONTRAST TECHNIQUE: Multidetector CT imaging of the abdomen and pelvis was performed using the standard protocol following bolus administration of intravenous  contrast. CONTRAST:  169mL OMNIPAQUE IOHEXOL 300 MG/ML  SOLN COMPARISON:  CT 05/17/2015 FINDINGS: Lower chest: Motion artifact through the lung bases. No pleural effusion or consolidation. Liver: No focal hepatic lesion. Hepatobiliary: Postcholecystectomy with stable intra and extrahepatic biliary prominence. Pancreas: Mild fatty atrophy of the head and body. No ductal dilatation or surrounding inflammation. Spleen: Normal. Adrenal glands: 1.5 cm right adrenal adenoma unchanged. Left adrenal gland is normal. Kidneys: Symmetric renal enhancement. No hydronephrosis. Exophytic cyst from the upper right kidney measures 4.9 cm. Additional smaller cortical cysts are seen in both kidneys, unchanged. Stomach/Bowel: Stomach physiologically distended. There are no dilated or thickened small bowel loops. Persistent inflammatory change in involving the sigmoid colon consistent with diverticulitis, with slight progression in degree of pericolonic fat stranding. There is no perforation or abscess. Moderate volume of stool throughout the more proximal colon without proximal colonic wall thickening. The appendix is surgically absent. Vascular/Lymphatic: No retroperitoneal adenopathy. The borderline celiac axis lymph node measuring 8 mm is unchanged. Abdominal aorta is normal in caliber. Mild atherosclerosis without aneurysm. Reproductive: Uterus surgically absent.  No adnexal mass. Bladder: Physiologically distended, no wall thickening. Other: No free air, free fluid, or intra-abdominal fluid collection. Fat containing umbilical hernia with mild surrounding edema in the anterior abdominal wall, appears chronic. Musculoskeletal: There are no acute or suspicious osseous abnormalities. Stable degenerative change in the spine. Hemi transitional lumbosacral anatomy. IMPRESSION: 1. Persistent or recurrent diverticulitis in the sigmoid colon, slightly increased in degree of inflammation from prior exam. There is however no perforation,  abscess or complication. 2. Additional chronic findings are stable. Electronically Signed   By:  Jeb Levering M.D.   On: 05/27/2015 23:35   Ct Abdomen Pelvis W Contrast  05/17/2015  CLINICAL DATA:  69 year old female with history of left upper quadrant abdominal pain for the past 7-8 weeks. Gas and bloating. Nausea. History of prior hysterectomy, cholecystectomy, appendectomy and oophorectomy. EXAM: CT ABDOMEN AND PELVIS WITH CONTRAST TECHNIQUE: Multidetector CT imaging of the abdomen and pelvis was performed using the standard protocol following bolus administration of intravenous contrast. CONTRAST:  157mL ISOVUE-300 IOPAMIDOL (ISOVUE-300) INJECTION 61% COMPARISON:  No priors. FINDINGS: Lower chest: Mild cardiomegaly. Atherosclerotic calcifications in the right coronary artery. Calcifications of the aortic valve. Hepatobiliary: No cystic or solid hepatic lesions. Minimal intrahepatic biliary ductal dilatation. Common bile duct measures 9 mm in the porta hepatis. Status post cholecystectomy. Pancreas: No pancreatic mass. No pancreatic ductal dilatation. No pancreatic or peripancreatic fluid or inflammatory changes. Spleen: Unremarkable. Adrenals/Urinary Tract: 1.5 cm right adrenal nodule demonstrates significant washout of contrast material (approximately 70%), compatible with an adenoma. Left adrenal gland is normal in appearance. Sub cm low-attenuation lesions in the kidneys bilaterally are too small to definitively characterize, but are favored to represent cysts. Exophytic ovoid shaped 4.3 cm low-attenuation lesion in the upper pole of the right kidney is compatible with a simple cyst. No hydroureteronephrosis. Urinary bladder is normal in appearance. Stomach/Bowel: Normal appearance of the stomach. No pathologic dilatation of small bowel or colon. There are few colonic diverticulae, and associated with a diverticulum in the mid sigmoid colon there are some subtle adjacent inflammatory changes, suggesting  mild acute diverticulitis. No diverticular abscess or signs of frank perforation are identified at this time. Status post appendectomy. Vascular/Lymphatic: Atherosclerosis throughout the abdominal and pelvic vasculature, without evidence of aneurysm or dissection. No lymphadenopathy noted in the abdomen or pelvis. Borderline enlarged 8 mm short axis celiac axis lymph node is nonspecific. Reproductive: Status post hysterectomy. Status post total abdominal hysterectomy and bilateral salpingo oophorectomy. Other: No significant volume of ascites. No pneumoperitoneum. Tiny umbilical hernia containing only omental fat incidentally noted. Musculoskeletal: There are no aggressive appearing lytic or blastic lesions noted in the visualized portions of the skeleton. IMPRESSION: 1. Findings, as above, concerning for very mild acute diverticulitis in the mid sigmoid colon. No signs of diverticular abscess or evidence of frank perforation at this time. 2. No other acute findings noted in the abdomen or pelvis. 3. Status post cholecystectomy. Mild intra and extrahepatic biliary ductal dilatation is favored to reflect benign post cholecystectomy physiology, but correlation with liver function tests may be appropriate. 4. 1.5 cm right adrenal adenoma incidentally noted. 5. Small umbilical hernia containing only omental fat. No signs of associated bowel incarceration or obstruction at this time. 6. Atherosclerosis. 7. Additional incidental findings, as above. Electronically Signed   By: Vinnie Langton M.D.   On: 05/17/2015 09:13    Imaging Review No results found. I have personally reviewed and evaluated these images and lab results as part of my medical decision-making.   EKG Interpretation None      MDM   Final diagnoses:  None    1. Diverticulitis  Patient presenting with recurrent/worsening symptoms related to diverticulitis diagnosed 9 days ago, on Augmentin and compliant. CT shows worsening inflammation.  Feel the patient would benefit from admission for IV antibiotics given failure of outpatient treatment and worsening symptoms. She is evaluated by Dr. Vanita Panda. [facility] paged for admission.    Charlann Lange, PA-C 05/28/15 0008  Carmin Muskrat, MD 05/28/15 [phone]

## 2015-05-27 NOTE — ED Notes (Signed)
Pt states that she felt SOB when they laid her flat on the CT table. Pt states that the SOB is almost completely gone. Pt states that she normally feels SOB when she lays flat, but was unable to say if the SOB was worse than normal.

## 2015-05-27 NOTE — ED Notes (Signed)
Pt has been on ABX x 7 days for diverticulitis.  Onset 2 days vomiting, nausea.  Has diarrhea from Augmentin and diverticulitis.  Has not been able to keep down Augmentin since last dose on Friday.

## 2015-05-28 ENCOUNTER — Encounter (HOSPITAL_COMMUNITY): Payer: Self-pay | Admitting: Internal Medicine

## 2015-05-28 DIAGNOSIS — K5900 Constipation, unspecified: Secondary | ICD-10-CM | POA: Diagnosis not present

## 2015-05-28 DIAGNOSIS — G4733 Obstructive sleep apnea (adult) (pediatric): Secondary | ICD-10-CM | POA: Diagnosis present

## 2015-05-28 DIAGNOSIS — E119 Type 2 diabetes mellitus without complications: Secondary | ICD-10-CM

## 2015-05-28 DIAGNOSIS — Z7982 Long term (current) use of aspirin: Secondary | ICD-10-CM | POA: Diagnosis not present

## 2015-05-28 DIAGNOSIS — Z9119 Patient's noncompliance with other medical treatment and regimen: Secondary | ICD-10-CM | POA: Diagnosis not present

## 2015-05-28 DIAGNOSIS — Z791 Long term (current) use of non-steroidal anti-inflammatories (NSAID): Secondary | ICD-10-CM | POA: Diagnosis not present

## 2015-05-28 DIAGNOSIS — E118 Type 2 diabetes mellitus with unspecified complications: Secondary | ICD-10-CM

## 2015-05-28 DIAGNOSIS — K5732 Diverticulitis of large intestine without perforation or abscess without bleeding: Secondary | ICD-10-CM | POA: Diagnosis not present

## 2015-05-28 DIAGNOSIS — R0602 Shortness of breath: Secondary | ICD-10-CM | POA: Diagnosis not present

## 2015-05-28 DIAGNOSIS — I10 Essential (primary) hypertension: Secondary | ICD-10-CM | POA: Diagnosis not present

## 2015-05-28 DIAGNOSIS — K8689 Other specified diseases of pancreas: Secondary | ICD-10-CM | POA: Diagnosis present

## 2015-05-28 DIAGNOSIS — D649 Anemia, unspecified: Secondary | ICD-10-CM

## 2015-05-28 DIAGNOSIS — Z794 Long term (current) use of insulin: Secondary | ICD-10-CM

## 2015-05-28 DIAGNOSIS — F410 Panic disorder [episodic paroxysmal anxiety] without agoraphobia: Secondary | ICD-10-CM | POA: Diagnosis not present

## 2015-05-28 DIAGNOSIS — Z8249 Family history of ischemic heart disease and other diseases of the circulatory system: Secondary | ICD-10-CM | POA: Diagnosis not present

## 2015-05-28 DIAGNOSIS — E876 Hypokalemia: Secondary | ICD-10-CM | POA: Diagnosis not present

## 2015-05-28 DIAGNOSIS — Z823 Family history of stroke: Secondary | ICD-10-CM | POA: Diagnosis not present

## 2015-05-28 DIAGNOSIS — R1110 Vomiting, unspecified: Secondary | ICD-10-CM | POA: Diagnosis not present

## 2015-05-28 DIAGNOSIS — K3184 Gastroparesis: Secondary | ICD-10-CM | POA: Diagnosis present

## 2015-05-28 DIAGNOSIS — R109 Unspecified abdominal pain: Secondary | ICD-10-CM | POA: Diagnosis not present

## 2015-05-28 DIAGNOSIS — E1143 Type 2 diabetes mellitus with diabetic autonomic (poly)neuropathy: Secondary | ICD-10-CM | POA: Diagnosis not present

## 2015-05-28 DIAGNOSIS — K5792 Diverticulitis of intestine, part unspecified, without perforation or abscess without bleeding: Secondary | ICD-10-CM | POA: Diagnosis present

## 2015-05-28 DIAGNOSIS — E785 Hyperlipidemia, unspecified: Secondary | ICD-10-CM | POA: Diagnosis present

## 2015-05-28 DIAGNOSIS — I5033 Acute on chronic diastolic (congestive) heart failure: Secondary | ICD-10-CM | POA: Diagnosis not present

## 2015-05-28 DIAGNOSIS — K5660 Unspecified intestinal obstruction: Secondary | ICD-10-CM | POA: Diagnosis not present

## 2015-05-28 DIAGNOSIS — Z4682 Encounter for fitting and adjustment of non-vascular catheter: Secondary | ICD-10-CM | POA: Diagnosis not present

## 2015-05-28 DIAGNOSIS — M1990 Unspecified osteoarthritis, unspecified site: Secondary | ICD-10-CM | POA: Diagnosis present

## 2015-05-28 LAB — COMPREHENSIVE METABOLIC PANEL
ALK PHOS: 81 U/L (ref 38–126)
ALT: 15 U/L (ref 14–54)
ANION GAP: 7 (ref 5–15)
AST: 22 U/L (ref 15–41)
Albumin: 2.8 g/dL — ABNORMAL LOW (ref 3.5–5.0)
BILIRUBIN TOTAL: 0.6 mg/dL (ref 0.3–1.2)
BUN: 5 mg/dL — ABNORMAL LOW (ref 6–20)
CALCIUM: 8.7 mg/dL — AB (ref 8.9–10.3)
CO2: 27 mmol/L (ref 22–32)
CREATININE: 0.68 mg/dL (ref 0.44–1.00)
Chloride: 108 mmol/L (ref 101–111)
GFR calc non Af Amer: >60 mL/min (ref >60)
Glucose, Bld: 95 mg/dL (ref 65–99)
Potassium: 3.1 mmol/L — ABNORMAL LOW (ref 3.5–5.1)
Sodium: 142 mmol/L (ref 135–145)
TOTAL PROTEIN: 5.6 g/dL — AB (ref 6.5–8.1)

## 2015-05-28 LAB — URINALYSIS, ROUTINE W REFLEX MICROSCOPIC
Bilirubin Urine: NEGATIVE
Glucose, UA: NEGATIVE mg/dL
Hgb urine dipstick: NEGATIVE
Ketones, ur: 15 mg/dL — AB
LEUKOCYTES UA: NEGATIVE
NITRITE: NEGATIVE
PROTEIN: 30 mg/dL — AB
Specific Gravity, Urine: 1.042 — ABNORMAL HIGH (ref 1.005–1.030)
UROBILINOGEN UA: 1 mg/dL (ref 0.0–1.0)
pH: 7 (ref 5.0–8.0)

## 2015-05-28 LAB — CBC WITH DIFFERENTIAL/PLATELET
BASOS ABS: 0 10*3/uL (ref 0.0–0.1)
BASOS PCT: 0 %
EOS ABS: 0.5 10*3/uL (ref 0.0–0.7)
Eosinophils Relative: 4 %
HCT: 30.6 % — ABNORMAL LOW (ref 36.0–46.0)
Hemoglobin: 9.6 g/dL — ABNORMAL LOW (ref 12.0–15.0)
Lymphocytes Relative: 23 %
Lymphs Abs: 2.6 10*3/uL (ref 0.7–4.0)
MCH: 27.6 pg (ref 26.0–34.0)
MCHC: 31.4 g/dL (ref 30.0–36.0)
MCV: 87.9 fL (ref 78.0–100.0)
MONO ABS: 0.6 10*3/uL (ref 0.1–1.0)
Monocytes Relative: 6 %
NEUTROS ABS: 7.5 10*3/uL (ref 1.7–7.7)
NEUTROS PCT: 67 %
PLATELETS: 302 10*3/uL (ref 150–400)
RBC: 3.48 MIL/uL — ABNORMAL LOW (ref 3.87–5.11)
RDW: 13.6 % (ref 11.5–15.5)
WBC: 11.2 10*3/uL — ABNORMAL HIGH (ref 4.0–10.5)

## 2015-05-28 LAB — MAGNESIUM: Magnesium: 1.7 mg/dL (ref 1.7–2.4)

## 2015-05-28 LAB — URINE MICROSCOPIC-ADD ON

## 2015-05-28 LAB — C DIFFICILE QUICK SCREEN W PCR REFLEX
C DIFFICLE (CDIFF) ANTIGEN: NEGATIVE
C Diff interpretation: NEGATIVE
C Diff toxin: NEGATIVE

## 2015-05-28 LAB — GLUCOSE, CAPILLARY
GLUCOSE-CAPILLARY: 91 mg/dL (ref 65–99)
GLUCOSE-CAPILLARY: 111 mg/dL — AB (ref 65–99)
Glucose-Capillary: 93 mg/dL (ref 65–99)
Glucose-Capillary: 91 mg/dL (ref 65–99)

## 2015-05-28 MED ORDER — CIPROFLOXACIN IN D5W 400 MG/200ML IV SOLN
400.0000 mg | Freq: Once | INTRAVENOUS | Status: AC
  Administered 2015-05-28: 400 mg via INTRAVENOUS
  Filled 2015-05-28: qty 200

## 2015-05-28 MED ORDER — POTASSIUM CHLORIDE 10 MEQ/100ML IV SOLN
10.0000 meq | INTRAVENOUS | Status: AC
  Administered 2015-05-28 (×4): 10 meq via INTRAVENOUS
  Filled 2015-05-28 (×4): qty 100

## 2015-05-28 MED ORDER — INSULIN ASPART 100 UNIT/ML ~~LOC~~ SOLN
0.0000 [IU] | SUBCUTANEOUS | Status: DC
Start: 2015-05-28 — End: 2015-06-07
  Administered 2015-05-28 – 2015-05-29 (×2): 1 [IU] via SUBCUTANEOUS
  Administered 2015-05-30 (×3): 2 [IU] via SUBCUTANEOUS
  Administered 2015-05-30 (×3): 1 [IU] via SUBCUTANEOUS
  Administered 2015-05-31 (×5): 2 [IU] via SUBCUTANEOUS
  Administered 2015-05-31: 1 [IU] via SUBCUTANEOUS
  Administered 2015-05-31: 2 [IU] via SUBCUTANEOUS
  Administered 2015-06-01 – 2015-06-03 (×10): 1 [IU] via SUBCUTANEOUS
  Administered 2015-06-03: 2 [IU] via SUBCUTANEOUS
  Administered 2015-06-03 – 2015-06-04 (×3): 1 [IU] via SUBCUTANEOUS
  Administered 2015-06-04: 2 [IU] via SUBCUTANEOUS
  Administered 2015-06-04: 1 [IU] via SUBCUTANEOUS
  Administered 2015-06-04: 2 [IU] via SUBCUTANEOUS
  Administered 2015-06-04 (×3): 1 [IU] via SUBCUTANEOUS
  Administered 2015-06-05 (×3): 2 [IU] via SUBCUTANEOUS
  Administered 2015-06-05: 1 [IU] via SUBCUTANEOUS
  Administered 2015-06-05: 2 [IU] via SUBCUTANEOUS
  Administered 2015-06-05 – 2015-06-06 (×2): 1 [IU] via SUBCUTANEOUS
  Administered 2015-06-06 (×3): 2 [IU] via SUBCUTANEOUS
  Administered 2015-06-06: 1 [IU] via SUBCUTANEOUS
  Administered 2015-06-06 – 2015-06-07 (×2): 2 [IU] via SUBCUTANEOUS
  Administered 2015-06-07: 1 [IU] via SUBCUTANEOUS
  Administered 2015-06-07: 2 [IU] via SUBCUTANEOUS

## 2015-05-28 MED ORDER — ENOXAPARIN SODIUM 60 MG/0.6ML ~~LOC~~ SOLN
60.0000 mg | SUBCUTANEOUS | Status: DC
  Administered 2015-05-29 – 2015-06-06 (×9): 60 mg via SUBCUTANEOUS
  Filled 2015-05-28 (×10): qty 0.6

## 2015-05-28 MED ORDER — METRONIDAZOLE IN NACL 5-0.79 MG/ML-% IV SOLN
500.0000 mg | Freq: Three times a day (TID) | INTRAVENOUS | Status: DC
  Administered 2015-05-28 – 2015-06-06 (×28): 500 mg via INTRAVENOUS
  Filled 2015-05-28 (×32): qty 100

## 2015-05-28 MED ORDER — DIPHENHYDRAMINE HCL 50 MG/ML IJ SOLN
12.5000 mg | Freq: Four times a day (QID) | INTRAMUSCULAR | Status: DC | PRN
  Administered 2015-06-02: 12.5 mg via INTRAVENOUS
  Filled 2015-05-28: qty 1

## 2015-05-28 MED ORDER — CIPROFLOXACIN IN D5W 400 MG/200ML IV SOLN
400.0000 mg | Freq: Two times a day (BID) | INTRAVENOUS | Status: DC
Start: 2015-05-28 — End: 2015-06-06
  Administered 2015-05-28 – 2015-06-06 (×19): 400 mg via INTRAVENOUS
  Filled 2015-05-28 (×21): qty 200

## 2015-05-28 MED ORDER — ACETAMINOPHEN 325 MG PO TABS
650.0000 mg | ORAL_TABLET | Freq: Four times a day (QID) | ORAL | Status: DC | PRN
Start: 2015-05-28 — End: 2015-06-07
  Administered 2015-06-01: 650 mg via ORAL
  Filled 2015-05-28: qty 2

## 2015-05-28 MED ORDER — MAGNESIUM SULFATE 50 % IJ SOLN
3.0000 g | Freq: Once | INTRAVENOUS | Status: AC
  Administered 2015-05-28: 3 g via INTRAVENOUS
  Filled 2015-05-28: qty 6

## 2015-05-28 MED ORDER — HYDROMORPHONE HCL 1 MG/ML IJ SOLN
1.0000 mg | INTRAMUSCULAR | Status: DC | PRN
  Administered 2015-05-28 – 2015-05-31 (×7): 1 mg via INTRAVENOUS
  Filled 2015-05-28 (×7): qty 1

## 2015-05-28 MED ORDER — HYDRALAZINE HCL 20 MG/ML IJ SOLN
10.0000 mg | INTRAMUSCULAR | Status: DC | PRN
  Administered 2015-05-28 – 2015-06-04 (×7): 10 mg via INTRAVENOUS
  Filled 2015-05-28 (×7): qty 1

## 2015-05-28 MED ORDER — PROMETHAZINE HCL 25 MG/ML IJ SOLN
12.5000 mg | Freq: Four times a day (QID) | INTRAMUSCULAR | Status: DC | PRN
  Administered 2015-05-28 – 2015-06-03 (×14): 12.5 mg via INTRAVENOUS
  Filled 2015-05-28 (×16): qty 1

## 2015-05-28 MED ORDER — ONDANSETRON HCL 4 MG/2ML IJ SOLN
4.0000 mg | Freq: Once | INTRAMUSCULAR | Status: AC
  Administered 2015-05-28: 4 mg via INTRAVENOUS
  Filled 2015-05-28: qty 2

## 2015-05-28 MED ORDER — ONDANSETRON HCL 4 MG/2ML IJ SOLN
4.0000 mg | Freq: Four times a day (QID) | INTRAMUSCULAR | Status: DC | PRN
  Administered 2015-05-28: 4 mg via INTRAVENOUS
  Filled 2015-05-28: qty 2

## 2015-05-28 MED ORDER — METRONIDAZOLE IN NACL 5-0.79 MG/ML-% IV SOLN
500.0000 mg | Freq: Once | INTRAVENOUS | Status: AC
  Administered 2015-05-28: 500 mg via INTRAVENOUS
  Filled 2015-05-28: qty 100

## 2015-05-28 MED ORDER — POTASSIUM CHLORIDE IN NACL 20-0.9 MEQ/L-% IV SOLN
INTRAVENOUS | Status: AC
  Administered 2015-05-28 (×2): via INTRAVENOUS
  Filled 2015-05-28: qty 1000

## 2015-05-28 MED ORDER — MORPHINE SULFATE (PF) 2 MG/ML IV SOLN
1.0000 mg | INTRAVENOUS | Status: DC | PRN
  Administered 2015-05-28 (×2): 1 mg via INTRAVENOUS
  Filled 2015-05-28 (×2): qty 1

## 2015-05-28 MED ORDER — ONDANSETRON HCL 4 MG/2ML IJ SOLN
4.0000 mg | INTRAMUSCULAR | Status: DC | PRN
  Administered 2015-05-28 – 2015-06-04 (×11): 4 mg via INTRAVENOUS
  Filled 2015-05-28 (×11): qty 2

## 2015-05-28 MED ORDER — ENOXAPARIN SODIUM 40 MG/0.4ML ~~LOC~~ SOLN
40.0000 mg | SUBCUTANEOUS | Status: DC
  Administered 2015-05-28: 40 mg via SUBCUTANEOUS
  Filled 2015-05-28: qty 0.4

## 2015-05-28 MED ORDER — ONDANSETRON HCL 4 MG PO TABS: 4.0000 mg | ORAL_TABLET | Freq: Four times a day (QID) | ORAL | Status: DC | PRN

## 2015-05-28 MED ORDER — INSULIN GLARGINE 100 UNIT/ML ~~LOC~~ SOLN
20.0000 [IU] | Freq: Every day | SUBCUTANEOUS | Status: DC
  Filled 2015-05-28: qty 0.2

## 2015-05-28 MED ORDER — ACETAMINOPHEN 650 MG RE SUPP
650.0000 mg | Freq: Four times a day (QID) | RECTAL | Status: DC | PRN
Start: 2015-05-28 — End: 2015-06-07

## 2015-05-28 NOTE — Progress Notes (Signed)
[facility]                                                                              Patient Demographics  Sharon [name], is a 69 y.o. female, DOB - 03/03/1946, [id number]  Admit date - 05/27/2015   Admitting Physician Rise Patience, MD  Outpatient Primary MD for the patient is Osborne Casco, MD  LOS - 0   Chief Complaint  Patient presents with  . Abdominal Pain  . Emesis       Brief HPI   Sharon [name] is a 69 y.o. female with history of diabetes mellitus type 2, hypertension, hyperlipidemia, OSA and diastolic CHF presents to the ER because of persistent nausea vomiting and diarrhea over the last 2 days with left lower quadrant abdominal pain. Patient's symptoms started about 1 week ago when patient had left lower quadrant pain and patient's gastroenterologist place patient on Augmentin. Patient states at that time patient's pain improved gradually and 3 days ago patient had eaten outside following which patient's symptoms started coming back with nausea vomiting and worsening abdominal pain mostly in the left lower quadrant. Patient has a persistent pain in the left lower quadrant at times has a colicky nature. Denies any blood in the vomitus or diarrhea. Patient has been unable to keep in anything last 2 days and has not taken her medications including insulin. CT abdomen and pelvis done in the ER shows recurrent sigmoid diverticulitis and patient has been admitted for further management. Denies any chest pain or shortness of breath. Labs also revealed hypokalemia.   Assessment & Plan    Principal Problem:   Sigmoid diverticulitis - CT abdomen showed persistent or recurrent diverticulitis in the sigmoid colon, slightly increased in the degree of inflammation from the prior examination. No abscess or perforation.  - Continue IV ciprofloxacin and Flagyl -Stool studies pending, Follow C. Difficile - EDP had discussed with GI, Dr. Paulita Fujita, GI  consult recommendations to follow - Placed on antiemetics, NPO, IVF   Active Problems:   Hypertension - Currently stable, continue IV hydralazine as needed  Hypokalemia, hypomagnesemia - Replaced IV    Diabetes mellitus type 2, controlled (HCC) - Continue sliding scale insulin and lower dose of Lantus, follow closely    Chronic anemia Follow CBC, currently no bloody diarrhea per patient  History of diastolic CHF, grade 2 diastolic dysfunction - 2-D echo in 04/2013 had shown EF of 55-60% with grade 2 diastolic dysfunction, follow I's and O's closely.  Code Status: Full code  Family Communication: Discussed in detail with the patient, all imaging results, lab results explained to the patient    Disposition Plan:   Time Spent in minutes   25 minutes  Procedures  CT abd  Consults   GI   DVT Prophylaxis  Lovenox   Medications  Scheduled Meds: . ciprofloxacin  400 mg Intravenous Q12H  . enoxaparin (LOVENOX) injection  40 mg Subcutaneous Q24H  . insulin aspart  0-9 Units Subcutaneous Q4H  . insulin glargine  20 Units Subcutaneous Daily  . metronidazole  500 mg Intravenous Q8H   Continuous Infusions: . 0.9 % NaCl with  KCl 20 mEq / L 75 mL/hr at 05/28/15 0218   PRN Meds:.acetaminophen **OR** acetaminophen, hydrALAZINE, HYDROmorphone (DILAUDID) injection, [DISCONTINUED] ondansetron **OR** ondansetron (ZOFRAN) IV, promethazine   Antibiotics   Anti-infectives    Start     Dose/Rate Route Frequency Ordered Stop   05/28/15 1000  ciprofloxacin (CIPRO) IVPB 400 mg     400 mg 200 mL/hr over 60 Minutes Intravenous Every 12 hours 05/28/15 0212     05/28/15 0600  metroNIDAZOLE (FLAGYL) IVPB 500 mg     500 mg 100 mL/hr over 60 Minutes Intravenous Every 8 hours 05/28/15 0208     05/28/15 0015  ciprofloxacin (CIPRO) IVPB 400 mg     400 mg 200 mL/hr over 60 Minutes Intravenous  Once 05/28/15 0001 05/28/15 0127   05/28/15 0015  metroNIDAZOLE (FLAGYL) IVPB 500 mg     500  mg 100 mL/hr over 60 Minutes Intravenous  Once 05/28/15 0001 05/28/15 0236        Subjective:   Sharon [name] was seen and examined today. Still having significant nausea, dry heaving. Abdominal pain 6/10 in the lower quadrants. Denies any chest pain, shortness of breath, fevers or chills.   Objective:   Blood pressure 181/73, pulse 73, temperature 98 F (36.7 C), temperature source Oral, resp. rate 20, height 4\' 11"  (1.499 m), weight 122.7 kg (270 lb 8.1 oz), SpO2 96 %.  Wt Readings from Last 3 Encounters:  05/28/15 122.7 kg (270 lb 8.1 oz)  04/12/15 124.467 kg (274 lb 6.4 oz)  07/24/14 115.214 kg (254 lb)     Intake/Output Summary (Last 24 hours) at 05/28/15 1126 Last data filed at 05/28/15 1030  Gross per 24 hour  Intake    900 ml  Output    750 ml  Net    150 ml    Exam  General: Alert and oriented x 3, NAD  HEENT:  PERRLA, EOMI, Anicteric Sclera, mucous membranes moist.   Neck: Supple, no JVD, no masses  CVS: S1 S2 auscultated, no rubs, murmurs or gallops. Regular rate and rhythm.  Respiratory: Clear to auscultation bilaterally, no wheezing, rales or rhonchi  Abdomen: Obese, Soft, tender in the left lower quadrant , nondistended, + bowel sounds  Ext: no cyanosis clubbing or edema  Neuro: AAOx3, Cr N's II- XII. Strength 5/5 upper and lower extremities bilaterally  Skin: No rashes  Psych: Normal affect and demeanor, alert and oriented x3    Data Review   Micro Results No results found for this or any previous visit (from the past 240 hour(s)).  Radiology Reports Ct Abdomen Pelvis W Contrast  05/27/2015  CLINICAL DATA:  Vomiting, nausea, and diarrhea. Recent diagnosis of diverticulitis post 7 days of antibiotics. Diverticulitis versus developed abscess. EXAM: CT ABDOMEN AND PELVIS WITH CONTRAST TECHNIQUE: Multidetector CT imaging of the abdomen and pelvis was performed using the standard protocol following bolus administration of intravenous contrast.  CONTRAST:  156mL OMNIPAQUE IOHEXOL 300 MG/ML  SOLN COMPARISON:  CT 05/17/2015 FINDINGS: Lower chest: Motion artifact through the lung bases. No pleural effusion or consolidation. Liver: No focal hepatic lesion. Hepatobiliary: Postcholecystectomy with stable intra and extrahepatic biliary prominence. Pancreas: Mild fatty atrophy of the head and body. No ductal dilatation or surrounding inflammation. Spleen: Normal. Adrenal glands: 1.5 cm right adrenal adenoma unchanged. Left adrenal gland is normal. Kidneys: Symmetric renal enhancement. No hydronephrosis. Exophytic cyst from the upper right kidney measures 4.9 cm. Additional smaller cortical cysts are seen in both kidneys, unchanged. Stomach/Bowel: Stomach physiologically distended. There  are no dilated or thickened small bowel loops. Persistent inflammatory change in involving the sigmoid colon consistent with diverticulitis, with slight progression in degree of pericolonic fat stranding. There is no perforation or abscess. Moderate volume of stool throughout the more proximal colon without proximal colonic wall thickening. The appendix is surgically absent. Vascular/Lymphatic: No retroperitoneal adenopathy. The borderline celiac axis lymph node measuring 8 mm is unchanged. Abdominal aorta is normal in caliber. Mild atherosclerosis without aneurysm. Reproductive: Uterus surgically absent.  No adnexal mass. Bladder: Physiologically distended, no wall thickening. Other: No free air, free fluid, or intra-abdominal fluid collection. Fat containing umbilical hernia with mild surrounding edema in the anterior abdominal wall, appears chronic. Musculoskeletal: There are no acute or suspicious osseous abnormalities. Stable degenerative change in the spine. Hemi transitional lumbosacral anatomy. IMPRESSION: 1. Persistent or recurrent diverticulitis in the sigmoid colon, slightly increased in degree of inflammation from prior exam. There is however no perforation, abscess or  complication. 2. Additional chronic findings are stable. Electronically Signed   By: Jeb Levering M.D.   On: 05/27/2015 23:35   Ct Abdomen Pelvis W Contrast  05/17/2015  CLINICAL DATA:  69 year old female with history of left upper quadrant abdominal pain for the past 7-8 weeks. Gas and bloating. Nausea. History of prior hysterectomy, cholecystectomy, appendectomy and oophorectomy. EXAM: CT ABDOMEN AND PELVIS WITH CONTRAST TECHNIQUE: Multidetector CT imaging of the abdomen and pelvis was performed using the standard protocol following bolus administration of intravenous contrast. CONTRAST:  166mL ISOVUE-300 IOPAMIDOL (ISOVUE-300) INJECTION 61% COMPARISON:  No priors. FINDINGS: Lower chest: Mild cardiomegaly. Atherosclerotic calcifications in the right coronary artery. Calcifications of the aortic valve. Hepatobiliary: No cystic or solid hepatic lesions. Minimal intrahepatic biliary ductal dilatation. Common bile duct measures 9 mm in the porta hepatis. Status post cholecystectomy. Pancreas: No pancreatic mass. No pancreatic ductal dilatation. No pancreatic or peripancreatic fluid or inflammatory changes. Spleen: Unremarkable. Adrenals/Urinary Tract: 1.5 cm right adrenal nodule demonstrates significant washout of contrast material (approximately 70%), compatible with an adenoma. Left adrenal gland is normal in appearance. Sub cm low-attenuation lesions in the kidneys bilaterally are too small to definitively characterize, but are favored to represent cysts. Exophytic ovoid shaped 4.3 cm low-attenuation lesion in the upper pole of the right kidney is compatible with a simple cyst. No hydroureteronephrosis. Urinary bladder is normal in appearance. Stomach/Bowel: Normal appearance of the stomach. No pathologic dilatation of small bowel or colon. There are few colonic diverticulae, and associated with a diverticulum in the mid sigmoid colon there are some subtle adjacent inflammatory changes, suggesting mild acute  diverticulitis. No diverticular abscess or signs of frank perforation are identified at this time. Status post appendectomy. Vascular/Lymphatic: Atherosclerosis throughout the abdominal and pelvic vasculature, without evidence of aneurysm or dissection. No lymphadenopathy noted in the abdomen or pelvis. Borderline enlarged 8 mm short axis celiac axis lymph node is nonspecific. Reproductive: Status post hysterectomy. Status post total abdominal hysterectomy and bilateral salpingo oophorectomy. Other: No significant volume of ascites. No pneumoperitoneum. Tiny umbilical hernia containing only omental fat incidentally noted. Musculoskeletal: There are no aggressive appearing lytic or blastic lesions noted in the visualized portions of the skeleton. IMPRESSION: 1. Findings, as above, concerning for very mild acute diverticulitis in the mid sigmoid colon. No signs of diverticular abscess or evidence of frank perforation at this time. 2. No other acute findings noted in the abdomen or pelvis. 3. Status post cholecystectomy. Mild intra and extrahepatic biliary ductal dilatation is favored to reflect benign post cholecystectomy physiology, but correlation with  liver function tests may be appropriate. 4. 1.5 cm right adrenal adenoma incidentally noted. 5. Small umbilical hernia containing only omental fat. No signs of associated bowel incarceration or obstruction at this time. 6. Atherosclerosis. 7. Additional incidental findings, as above. Electronically Signed   By: Vinnie Langton M.D.   On: 05/17/2015 09:13    CBC  Recent Labs Lab 05/27/15 2026 05/28/15 0522  WBC 10.9* 11.2*  HGB 10.5* 9.6*  HCT 33.0* 30.6*  PLT 322 302  MCV 87.5 87.9  MCH 27.9 27.6  MCHC 31.8 31.4  RDW 13.6 13.6  LYMPHSABS  --  2.6  MONOABS  --  0.6  EOSABS  --  0.5  BASOSABS  --  0.0    Chemistries   Recent Labs Lab 05/27/15 2026 05/28/15 0522  NA 142 142  K 2.7* 3.1*  CL 107 108  CO2 27 27  GLUCOSE 117* 95  BUN 6 5*    CREATININE 0.68 0.68  CALCIUM 9.0 8.7*  MG  --  1.7  AST 14* 22  ALT 13* 15  ALKPHOS 77 81  BILITOT 0.6 0.6   ------------------------------------------------------------------------------------------------------------------ estimated creatinine clearance is 80.8 mL/min (by C-G formula based on Cr of 0.68). ------------------------------------------------------------------------------------------------------------------ No results for input(s): HGBA1C in the last 72 hours. ------------------------------------------------------------------------------------------------------------------ No results for input(s): CHOL, HDL, LDLCALC, TRIG, CHOLHDL, LDLDIRECT in the last 72 hours. ------------------------------------------------------------------------------------------------------------------ No results for input(s): TSH, T4TOTAL, T3FREE, THYROIDAB in the last 72 hours.  Invalid input(s): FREET3 ------------------------------------------------------------------------------------------------------------------ No results for input(s): VITAMINB12, FOLATE, FERRITIN, TIBC, IRON, RETICCTPCT in the last 72 hours.  Coagulation profile No results for input(s): INR, PROTIME in the last 168 hours.  No results for input(s): DDIMER in the last 72 hours.  Cardiac Enzymes No results for input(s): CKMB, TROPONINI, MYOGLOBIN in the last 168 hours.  Invalid input(s): CK ------------------------------------------------------------------------------------------------------------------ Invalid input(s): POCBNP   Recent Labs  05/28/15 0405 05/28/15 0730  GLUCAP 91 93     Kenney Going M.D. [facility] 05/28/2015, 11:26 AM  Pager: [phone] Between 7am to 7pm - call Pager - [phone]  After 7pm go to [email or web address] - password [email or web address]  Call night coverage person covering after 7pm

## 2015-05-28 NOTE — Progress Notes (Signed)
ANTIBIOTIC CONSULT NOTE - INITIAL  Pharmacy Consult for Cipro   Indication: Intra-abdominal infection  Allergies  Allergen Reactions  . Crestor [Rosuvastatin] Other (See Comments)    Myalgias  . Indomethacin Other (See Comments)    dizziness  . Invanz [Ertapenem Sodium] Hives  . Lipitor [Atorvastatin Calcium] Other (See Comments)    Myalgias      Vital Signs: Temp: 98.5 F (36.9 C) (11/13 2023) Temp Source: Oral (11/13 2023) BP: 166/77 mmHg (11/14 0100) Pulse Rate: 68 (11/14 0100)  Labs:  Recent Labs  05/27/15 2026  WBC 10.9*  HGB 10.5*  PLT 322  CREATININE 0.68   Medical History: Past Medical History  Diagnosis Date  . Diabetes mellitus   . Hypertension   . CHF (congestive heart failure) ([place])   . Asthma   . Osteoarthritis   . Anxiety   . GERD (gastroesophageal reflux disease)   . Kidney stones   . Hypercholesteremia   . Thrombophlebitis     Assessment: 69 y/o F with abdomina pain, N/V. Has been on augmentin for several days and unable to keep down since Friday. WBC 10.9. Renal function good.   Plan:  -Cipro 400 mg IV q12h -Flagyl per MD -F/U infectious work-up  Narda Bonds 05/28/2015,2:13 AM

## 2015-05-28 NOTE — H&P (Addendum)
[facility] History and Physical  Sharon [name] [id number] DOB: 1946/03/08 DOA: 05/27/2015  Referring physician: Ms.Shari.PA. PCP: Osborne Casco, MD  Specialists: Dr.Magod.  Chief Complaint: Abdominal pain nausea vomiting.  HPI: Sharon [name] is a 69 y.o. female with history of diabetes mellitus type 2, hypertension, hyperlipidemia, OSA and diastolic CHF presents to the ER because of persistent nausea vomiting and diarrhea over the last 2 days with left lower quadrant abdominal pain. Patient's symptoms started about 1 week ago when patient had left lower quadrant pain and patient's gastroenterologist place patient on Augmentin. Patient states at that time patient's pain improved gradually and 3 days ago patient had eaten outside following which patient's symptoms started coming back with nausea vomiting and worsening abdominal pain mostly in the left lower quadrant. Patient has a persistent pain in the left lower quadrant at times has a colicky nature. Denies any blood in the vomitus or diarrhea. Patient has been unable to keep in anything last 2 days and has not taken her medications including insulin. CT abdomen and pelvis done in the ER shows recurrent sigmoid diverticulitis and patient has been admitted for further management. Denies any chest pain or shortness of breath. Labs also revealed hypokalemia.  Review of Systems: As presented in the history of presenting illness, rest negative.  Past Medical History  Diagnosis Date  . Diabetes mellitus   . Hypertension   . CHF (congestive heart failure) ([place])   . Asthma   . Osteoarthritis   . Anxiety   . GERD (gastroesophageal reflux disease)   . Kidney stones   . Hypercholesteremia   . Thrombophlebitis    Past Surgical History  Procedure Laterality Date  . Appendectomy    . Cholecystectomy    . Breast surgery    . Abdominal hysterectomy    . Carpal tunnel release    . Incision and drainage perirectal abscess    .  Colonoscopy    . Esophagoscopy    . Pyelogram     Social History:  reports that she has never smoked. She does not have any smokeless tobacco history on file. She reports that she drinks alcohol. She reports that she does not use illicit drugs. Where does patient live home. Can patient participate in ADLs? Yes.  Allergies  Allergen Reactions  . Crestor [Rosuvastatin] Other (See Comments)    Myalgias  . Indomethacin Other (See Comments)    dizziness  . Invanz [Ertapenem Sodium] Hives  . Lipitor [Atorvastatin Calcium] Other (See Comments)    Myalgias     Family History:  Family History  Problem Relation Age of Onset  . Heart attack Mother     8 HEART ATTACKS  . Stroke Mother     3 STROKES  . Heart attack Father   . Stroke Father       Prior to Admission medications   Medication Sig Start Date End Date Taking? Authorizing Provider  aspirin EC 81 MG tablet Take 81 mg by mouth daily.     Yes Historical Provider, MD  beta carotene w/minerals (OCUVITE) tablet Take 1 tablet by mouth daily.   Yes Historical Provider, MD  Calcium Carbonate-Vitamin D (CALTRATE 600+D) 600-400 MG-UNIT per tablet Take 1 tablet by mouth 2 (two) times daily.    Yes Historical Provider, MD  dexlansoprazole (DEXILANT) 60 MG capsule Take 60 mg by mouth daily.     Yes Historical Provider, MD  Dextromethorphan-Guaifenesin 60-1200 MG per 12 hr tablet Take 1 tablet by mouth 2 (two) times  daily as needed (CONGESTION).    Yes Historical Provider, MD  fish oil-omega-3 fatty acids 1000 MG capsule Take 1 g by mouth daily.     Yes Historical Provider, MD  furosemide (LASIX) 40 MG tablet Take 40 mg by mouth 2 (two) times daily.     Yes Historical Provider, MD  gabapentin (NEURONTIN) 300 MG capsule Take 600 mg by mouth 2 (two) times daily.    Yes Historical Provider, MD  hydrocortisone cream 0.5 % Apply 1 application topically 2 (two) times daily as needed for itching.   Yes Historical Provider, MD  ibuprofen  (ADVIL,MOTRIN) 200 MG tablet Take 800 mg by mouth every 6 (six) hours as needed for mild pain. For pain    Yes Historical Provider, MD  Insulin Degludec (TRESIBA FLEXTOUCH [place]) Inject 120 Units into the skin daily.   Yes Historical Provider, MD  lisinopril-hydrochlorothiazide (PRINZIDE,ZESTORETIC) 20-12.5 MG per tablet Take 2 tablets by mouth daily.    Yes Historical Provider, MD  loratadine (CLARITIN) 10 MG tablet Take 10 mg by mouth daily.     Yes Historical Provider, MD  meloxicam (MOBIC) 15 MG tablet Take 15 mg by mouth daily.     Yes Historical Provider, MD  metoprolol succinate (TOPROL XL) 25 MG 24 hr tablet Take 1 tablet (25 mg total) by mouth daily. 07/19/14  Yes Jerline Pain, MD  ondansetron (ZOFRAN) 4 MG tablet Take 4-8 mg by mouth every 8 (eight) hours as needed for nausea or vomiting. 4-8mg  prn 06/12/14  Yes Historical Provider, MD  PARoxetine (PAXIL) 40 MG tablet Take 60 mg by mouth every morning.   Yes Historical Provider, MD  polyethylene glycol (MIRALAX / GLYCOLAX) packet Take 17 g by mouth daily as needed for mild constipation.   Yes Historical Provider, MD  simvastatin (ZOCOR) 40 MG tablet Take 40 mg by mouth every morning.    Yes Historical Provider, MD  sitaGLIPtin (JANUVIA) 50 MG tablet Take 50 mg by mouth daily.   Yes Historical Provider, MD    Physical Exam: Filed Vitals:   05/28/15 0030 05/28/15 0051 05/28/15 0052 05/28/15 0100  BP: 161/70 177/82  166/77  Pulse: 61 71 69 68  Temp:      TempSrc:      Resp:   22 20  SpO2: 93% 95% 94% 94%     General:  Moderately built and nourished.  Eyes: Anicteric no pallor.  ENT: No discharge from the ears eyes nose and mouth.  Neck: No mass felt.  Cardiovascular: S1-S2 heard.  Respiratory: No rhonchi or crepitations.  Abdomen: Tenderness in the left lower quadrant no guarding or rigidity. Bowel sounds present.  Skin: No rash.  Musculoskeletal: No edema.  Psychiatric: Appears normal.  Neurologic: Alert awake  oriented to time place and person. Moves all extremities.  Labs on Admission:  Basic Metabolic Panel:  Recent Labs Lab 05/27/15 2026  NA 142  K 2.7*  CL 107  CO2 27  GLUCOSE 117*  BUN 6  CREATININE 0.68  CALCIUM 9.0   Liver Function Tests:  Recent Labs Lab 05/27/15 2026  AST 14*  ALT 13*  ALKPHOS 77  BILITOT 0.6  PROT 6.5  ALBUMIN 3.1*    Recent Labs Lab 05/27/15 2026  LIPASE 20   No results for input(s): AMMONIA in the last 168 hours. CBC:  Recent Labs Lab 05/27/15 2026  WBC 10.9*  HGB 10.5*  HCT 33.0*  MCV 87.5  PLT 322   Cardiac Enzymes: No results for  input(s): CKTOTAL, CKMB, CKMBINDEX, TROPONINI in the last 168 hours.  BNP (last 3 results) No results for input(s): BNP in the last 8760 hours.  ProBNP (last 3 results) No results for input(s): PROBNP in the last 8760 hours.  CBG: No results for input(s): GLUCAP in the last 168 hours.  Radiological Exams on Admission: Ct Abdomen Pelvis W Contrast  05/27/2015  CLINICAL DATA:  Vomiting, nausea, and diarrhea. Recent diagnosis of diverticulitis post 7 days of antibiotics. Diverticulitis versus developed abscess. EXAM: CT ABDOMEN AND PELVIS WITH CONTRAST TECHNIQUE: Multidetector CT imaging of the abdomen and pelvis was performed using the standard protocol following bolus administration of intravenous contrast. CONTRAST:  162mL OMNIPAQUE IOHEXOL 300 MG/ML  SOLN COMPARISON:  CT 05/17/2015 FINDINGS: Lower chest: Motion artifact through the lung bases. No pleural effusion or consolidation. Liver: No focal hepatic lesion. Hepatobiliary: Postcholecystectomy with stable intra and extrahepatic biliary prominence. Pancreas: Mild fatty atrophy of the head and body. No ductal dilatation or surrounding inflammation. Spleen: Normal. Adrenal glands: 1.5 cm right adrenal adenoma unchanged. Left adrenal gland is normal. Kidneys: Symmetric renal enhancement. No hydronephrosis. Exophytic cyst from the upper right kidney  measures 4.9 cm. Additional smaller cortical cysts are seen in both kidneys, unchanged. Stomach/Bowel: Stomach physiologically distended. There are no dilated or thickened small bowel loops. Persistent inflammatory change in involving the sigmoid colon consistent with diverticulitis, with slight progression in degree of pericolonic fat stranding. There is no perforation or abscess. Moderate volume of stool throughout the more proximal colon without proximal colonic wall thickening. The appendix is surgically absent. Vascular/Lymphatic: No retroperitoneal adenopathy. The borderline celiac axis lymph node measuring 8 mm is unchanged. Abdominal aorta is normal in caliber. Mild atherosclerosis without aneurysm. Reproductive: Uterus surgically absent.  No adnexal mass. Bladder: Physiologically distended, no wall thickening. Other: No free air, free fluid, or intra-abdominal fluid collection. Fat containing umbilical hernia with mild surrounding edema in the anterior abdominal wall, appears chronic. Musculoskeletal: There are no acute or suspicious osseous abnormalities. Stable degenerative change in the spine. Hemi transitional lumbosacral anatomy. IMPRESSION: 1. Persistent or recurrent diverticulitis in the sigmoid colon, slightly increased in degree of inflammation from prior exam. There is however no perforation, abscess or complication. 2. Additional chronic findings are stable. Electronically Signed   By: Jeb Levering M.D.   On: 05/27/2015 23:35    EKG: Independently reviewed. Normal sinus rhythm with possible U waves.  Assessment/Plan Principal Problem:   Sigmoid diverticulitis Active Problems:   Hypertension   Diverticulitis   Hypokalemia   Diabetes mellitus type 2, controlled (HCC)   Chronic anemia   1. Sigmoid diverticulitis - repeat CAT scan shows increasing inflammation but no abscess or perforation. At this time I have place patient nothing by mouth and continued on IV Cipro and Flagyl.  Patient will be kept on pain relief medications and IV fluids. Check stool studies for C. Difficile. Patient states her last colonoscopy was in 2015. ER physician did discuss with Dr. Paulita Fujita, on-call gastroenterologist will be seeing patient in consult. 2. Hypokalemia probably from nausea vomiting and diarrhea - replace and recheck potassium levels. I have ordered for additional KCl thru IV. Check magnesium levels. 3. Diabetes mellitus type 2 - patient has not taken her insulin for last 2 days. I have kept patient on Lantus 20 units daily in a.m. Patient usually takes 120 units of TRESIBA. 4. History of diastolic CHF last year measured in 2014 showed grade 2 diastolic dysfunction - patient is receiving IV fluids due to  nausea vomiting and diarrhea. Closely monitor respiratory status. 5. Hypertension - since patient is nothing by mouth I have placed patient on when necessary IV hydralazine. 6. Hyperlipidemia - continue statins once patient can take orally. 7. History of OSA noncompliant with BiPAP. 8. Chronic anemia - follow CBC.  I have reviewed patient's old charts and labs.   DVT Prophylaxis Lovenox.  Code Status: Full code.  Family Communication: Discussed with patient.  Disposition Plan: Admit to inpatient.    Sufyan Meidinger N. [facility] Pager [phone].  If 7PM-7AM, please contact night-coverage [email or web address] Password [email or web address] 05/28/2015, 2:02 AM

## 2015-05-28 NOTE — Care Management Note (Signed)
Case Management Note  Patient Details  Name: Phedra Zelenka MRN: [id number] Date of Birth: [birth year]  Subjective/Objective:                    Action/Plan:   Expected Discharge Date:                  Expected Discharge Plan:  Home/Self Care  In-House Referral:     Discharge planning Services     Post Acute Care Choice:    Choice offered to:     DME Arranged:    DME Agency:     HH Arranged:    [place] Agency:     Status of Service:  In process, will continue to follow  Medicare Important Message Given:    Date Medicare IM Given:    Medicare IM give by:    Date Additional Medicare IM Given:    Additional Medicare Important Message give by:     If discussed at [place] of Stay Meetings, dates discussed:    Additional Comments: Initial UR completed  Marilu Favre, RN 05/28/2015, 2:42 PM

## 2015-05-28 NOTE — ED Notes (Signed)
[place], MD at bedside 

## 2015-05-28 NOTE — ED Notes (Signed)
Verdis Frederickson, RN on 6N notified of the delay.

## 2015-05-29 ENCOUNTER — Inpatient Hospital Stay (HOSPITAL_COMMUNITY): Payer: BLUE CROSS/BLUE SHIELD

## 2015-05-29 LAB — GLUCOSE, CAPILLARY
GLUCOSE-CAPILLARY: 80 mg/dL (ref 65–99)
GLUCOSE-CAPILLARY: 83 mg/dL (ref 65–99)
GLUCOSE-CAPILLARY: 86 mg/dL (ref 65–99)
Glucose-Capillary: 87 mg/dL (ref 65–99)
Glucose-Capillary: 98 mg/dL (ref 65–99)
Glucose-Capillary: 146 mg/dL — ABNORMAL HIGH (ref 65–99)

## 2015-05-29 LAB — BASIC METABOLIC PANEL
ANION GAP: 6 (ref 5–15)
BUN: <5 mg/dL — ABNORMAL LOW (ref 6–20)
CALCIUM: 8.6 mg/dL — AB (ref 8.9–10.3)
CHLORIDE: 109 mmol/L (ref 101–111)
CO2: 27 mmol/L (ref 22–32)
CREATININE: 0.78 mg/dL (ref 0.44–1.00)
GFR calc non Af Amer: >60 mL/min (ref >60)
Glucose, Bld: 93 mg/dL (ref 65–99)
Potassium: 3.4 mmol/L — ABNORMAL LOW (ref 3.5–5.1)
SODIUM: 142 mmol/L (ref 135–145)

## 2015-05-29 LAB — CBC
HCT: 30 % — ABNORMAL LOW (ref 36.0–46.0)
HEMOGLOBIN: 9.2 g/dL — AB (ref 12.0–15.0)
MCH: 27.2 pg (ref 26.0–34.0)
MCHC: 30.7 g/dL (ref 30.0–36.0)
MCV: 88.8 fL (ref 78.0–100.0)
PLATELETS: 286 10*3/uL (ref 150–400)
RBC: 3.38 MIL/uL — AB (ref 3.87–5.11)
RDW: 13.8 % (ref 11.5–15.5)
WBC: 7.6 10*3/uL (ref 4.0–10.5)

## 2015-05-29 MED ORDER — LISINOPRIL 20 MG PO TABS
20.0000 mg | ORAL_TABLET | Freq: Every day | ORAL | Status: DC
  Administered 2015-05-29: 20 mg via ORAL
  Filled 2015-05-29 (×2): qty 1

## 2015-05-29 MED ORDER — PAROXETINE HCL 20 MG PO TABS
60.0000 mg | ORAL_TABLET | Freq: Every day | ORAL | Status: DC
  Administered 2015-05-29: 60 mg via ORAL
  Filled 2015-05-29: qty 3
  Filled 2015-05-29 (×2): qty 2

## 2015-05-29 MED ORDER — POTASSIUM CHLORIDE CRYS ER 20 MEQ PO TBCR
40.0000 meq | EXTENDED_RELEASE_TABLET | Freq: Once | ORAL | Status: AC
  Administered 2015-05-29: 40 meq via ORAL
  Filled 2015-05-29: qty 2

## 2015-05-29 MED ORDER — METOPROLOL SUCCINATE ER 25 MG PO TB24
25.0000 mg | ORAL_TABLET | Freq: Every day | ORAL | Status: DC
  Administered 2015-05-29: 25 mg via ORAL
  Filled 2015-05-29 (×2): qty 1

## 2015-05-29 MED ORDER — DEXTROSE-NACL 5-0.45 % IV SOLN
INTRAVENOUS | Status: DC
  Administered 2015-05-29 – 2015-05-31 (×4): via INTRAVENOUS

## 2015-05-29 MED ORDER — LORATADINE 10 MG PO TABS
10.0000 mg | ORAL_TABLET | Freq: Every day | ORAL | Status: DC
  Administered 2015-05-29 – 2015-06-07 (×8): 10 mg via ORAL
  Filled 2015-05-29 (×10): qty 1

## 2015-05-29 NOTE — Progress Notes (Signed)
[facility]                                                                              Patient Demographics  Sharon [name], is a 69 y.o. female, DOB - [birth year], [id number]  Admit date - 05/27/2015   Admitting Physician Rise Patience, MD  Outpatient Primary MD for the patient is Osborne Casco, MD  LOS - 1   Chief Complaint  Patient presents with  . Abdominal Pain  . Emesis       Brief HPI   Sharon [name] is a 69 y.o. female with history of diabetes mellitus type 2, hypertension, hyperlipidemia, OSA and diastolic CHF presents to the ER because of persistent nausea vomiting and diarrhea over the last 2 days with left lower quadrant abdominal pain. Patient's symptoms started about 1 week ago when patient had left lower quadrant pain and patient's gastroenterologist place patient on Augmentin. Patient states at that time patient's pain improved gradually and 3 days ago patient had eaten outside following which patient's symptoms started coming back with nausea vomiting and worsening abdominal pain mostly in the left lower quadrant. Patient has a persistent pain in the left lower quadrant at times has a colicky nature. Denies any blood in the vomitus or diarrhea. Patient has been unable to keep in anything last 2 days and has not taken her medications including insulin. CT abdomen and pelvis done in the ER shows recurrent sigmoid diverticulitis and patient has been admitted for further management. Denies any chest pain or shortness of breath. Labs also revealed hypokalemia.   Assessment & Plan    Principal Problem:  Acute Sigmoid diverticulitis - CT abdomen showed persistent or recurrent diverticulitis in the sigmoid colon, slightly increased in the degree of inflammation from the prior examination. No abscess or perforation.  - Continue IV ciprofloxacin and Flagyl.  - C. difficile negative, placed on clear liquid diet -GI following.  Active  Problems:   Hypertension - Currently stable, restart beta blocker and lisinopril  Hypokalemia - Replaced     Diabetes mellitus type 2, controlled (HCC) - Continue sliding scale insulin  - Placed on clear liquid diet today    Chronic anemia H&H stable   History of diastolic CHF, grade 2 diastolic dysfunction - 2-D echo in 04/2013 had shown EF of 55-60% with grade 2 diastolic dysfunction, follow I's and O's closely.  Code Status: Full code  Family Communication: Discussed in detail with the patient, all imaging results, lab results explained to the patient    Disposition Plan:   Time Spent in minutes   25 minutes  Procedures  CT abd  Consults   GI   DVT Prophylaxis  Lovenox   Medications  Scheduled Meds: . ciprofloxacin  400 mg Intravenous Q12H  . enoxaparin (LOVENOX) injection  60 mg Subcutaneous Q24H  . insulin aspart  0-9 Units Subcutaneous Q4H  . metronidazole  500 mg Intravenous Q8H   Continuous Infusions:   PRN Meds:.acetaminophen **OR** acetaminophen, diphenhydrAMINE, hydrALAZINE, HYDROmorphone (DILAUDID) injection, [DISCONTINUED] ondansetron **OR** ondansetron (ZOFRAN) IV, promethazine   Antibiotics   Anti-infectives    Start     Dose/Rate  Route Frequency Ordered Stop   05/28/15 1000  ciprofloxacin (CIPRO) IVPB 400 mg     400 mg 200 mL/hr over 60 Minutes Intravenous Every 12 hours 05/28/15 0212     05/28/15 0600  metroNIDAZOLE (FLAGYL) IVPB 500 mg     500 mg 100 mL/hr over 60 Minutes Intravenous Every 8 hours 05/28/15 0208     05/28/15 0015  ciprofloxacin (CIPRO) IVPB 400 mg     400 mg 200 mL/hr over 60 Minutes Intravenous  Once 05/28/15 0001 05/28/15 0127   05/28/15 0015  metroNIDAZOLE (FLAGYL) IVPB 500 mg     500 mg 100 mL/hr over 60 Minutes Intravenous  Once 05/28/15 0001 05/28/15 0236        Subjective:   [name] was seen and examined today. Feels a lot better today asking for diet Coke. Still has abdominal pain, nausea and  vomiting has resolved. Denies any fevers or chills, chest pain, shortness of breath.  Objective:   Blood pressure 156/65, pulse 65, temperature 98.5 F (36.9 C), temperature source Oral, resp. rate 20, height 4\' 11"  (1.499 m), weight 123.424 kg (272 lb 1.6 oz), SpO2 94 %.  Wt Readings from Last 3 Encounters:  05/29/15 123.424 kg (272 lb 1.6 oz)  04/12/15 124.467 kg (274 lb 6.4 oz)  07/24/14 115.214 kg (254 lb)     Intake/Output Summary (Last 24 hours) at 05/29/15 1201 Last data filed at 05/29/15 0335  Gross per 24 hour  Intake 1080.27 ml  Output   1050 ml  Net  30.27 ml    Exam  General: Alert and oriented x 3, NAD  HEENT:  PERRLA, EOMI, Anicteric Sclera, mucous membranes moist.   Neck: Supple, no JVD, no masses  CVS: S1 S2 clear, RRR  Respiratory: CTAB  Abdomen: Obese, Soft, LLQ TTP , nondistended, + bowel sounds  Ext: no cyanosis clubbing or edema  Neuro: no new deficits  Skin: No rashes  Psych: Normal affect and demeanor, alert and oriented x3    Data Review   Micro Results Recent Results (from the past 240 hour(s))  C difficile quick scan w PCR reflex     Status: None   Collection Time: 05/28/15 10:35 AM  Result Value Ref Range Status   C Diff antigen NEGATIVE NEGATIVE Final   C Diff toxin NEGATIVE NEGATIVE Final   C Diff interpretation Negative for toxigenic C. difficile  Final    Radiology Reports Ct Abdomen Pelvis W Contrast  05/27/2015  CLINICAL DATA:  Vomiting, nausea, and diarrhea. Recent diagnosis of diverticulitis post 7 days of antibiotics. Diverticulitis versus developed abscess. EXAM: CT ABDOMEN AND PELVIS WITH CONTRAST TECHNIQUE: Multidetector CT imaging of the abdomen and pelvis was performed using the standard protocol following bolus administration of intravenous contrast. CONTRAST:  167mL OMNIPAQUE IOHEXOL 300 MG/ML  SOLN COMPARISON:  CT 05/17/2015 FINDINGS: Lower chest: Motion artifact through the lung bases. No pleural effusion or  consolidation. Liver: No focal hepatic lesion. Hepatobiliary: Postcholecystectomy with stable intra and extrahepatic biliary prominence. Pancreas: Mild fatty atrophy of the head and body. No ductal dilatation or surrounding inflammation. Spleen: Normal. Adrenal glands: 1.5 cm right adrenal adenoma unchanged. Left adrenal gland is normal. Kidneys: Symmetric renal enhancement. No hydronephrosis. Exophytic cyst from the upper right kidney measures 4.9 cm. Additional smaller cortical cysts are seen in both kidneys, unchanged. Stomach/Bowel: Stomach physiologically distended. There are no dilated or thickened small bowel loops. Persistent inflammatory change in involving the sigmoid colon consistent with diverticulitis, with slight progression in  degree of pericolonic fat stranding. There is no perforation or abscess. Moderate volume of stool throughout the more proximal colon without proximal colonic wall thickening. The appendix is surgically absent. Vascular/Lymphatic: No retroperitoneal adenopathy. The borderline celiac axis lymph node measuring 8 mm is unchanged. Abdominal aorta is normal in caliber. Mild atherosclerosis without aneurysm. Reproductive: Uterus surgically absent.  No adnexal mass. Bladder: Physiologically distended, no wall thickening. Other: No free air, free fluid, or intra-abdominal fluid collection. Fat containing umbilical hernia with mild surrounding edema in the anterior abdominal wall, appears chronic. Musculoskeletal: There are no acute or suspicious osseous abnormalities. Stable degenerative change in the spine. Hemi transitional lumbosacral anatomy. IMPRESSION: 1. Persistent or recurrent diverticulitis in the sigmoid colon, slightly increased in degree of inflammation from prior exam. There is however no perforation, abscess or complication. 2. Additional chronic findings are stable. Electronically Signed   By: Jeb Levering M.D.   On: 05/27/2015 23:35   Ct Abdomen Pelvis W  Contrast  05/17/2015  CLINICAL DATA:  69 year old female with history of left upper quadrant abdominal pain for the past 7-8 weeks. Gas and bloating. Nausea. History of prior hysterectomy, cholecystectomy, appendectomy and oophorectomy. EXAM: CT ABDOMEN AND PELVIS WITH CONTRAST TECHNIQUE: Multidetector CT imaging of the abdomen and pelvis was performed using the standard protocol following bolus administration of intravenous contrast. CONTRAST:  184mL ISOVUE-300 IOPAMIDOL (ISOVUE-300) INJECTION 61% COMPARISON:  No priors. FINDINGS: Lower chest: Mild cardiomegaly. Atherosclerotic calcifications in the right coronary artery. Calcifications of the aortic valve. Hepatobiliary: No cystic or solid hepatic lesions. Minimal intrahepatic biliary ductal dilatation. Common bile duct measures 9 mm in the porta hepatis. Status post cholecystectomy. Pancreas: No pancreatic mass. No pancreatic ductal dilatation. No pancreatic or peripancreatic fluid or inflammatory changes. Spleen: Unremarkable. Adrenals/Urinary Tract: 1.5 cm right adrenal nodule demonstrates significant washout of contrast material (approximately 70%), compatible with an adenoma. Left adrenal gland is normal in appearance. Sub cm low-attenuation lesions in the kidneys bilaterally are too small to definitively characterize, but are favored to represent cysts. Exophytic ovoid shaped 4.3 cm low-attenuation lesion in the upper pole of the right kidney is compatible with a simple cyst. No hydroureteronephrosis. Urinary bladder is normal in appearance. Stomach/Bowel: Normal appearance of the stomach. No pathologic dilatation of small bowel or colon. There are few colonic diverticulae, and associated with a diverticulum in the mid sigmoid colon there are some subtle adjacent inflammatory changes, suggesting mild acute diverticulitis. No diverticular abscess or signs of frank perforation are identified at this time. Status post appendectomy. Vascular/Lymphatic:  Atherosclerosis throughout the abdominal and pelvic vasculature, without evidence of aneurysm or dissection. No lymphadenopathy noted in the abdomen or pelvis. Borderline enlarged 8 mm short axis celiac axis lymph node is nonspecific. Reproductive: Status post hysterectomy. Status post total abdominal hysterectomy and bilateral salpingo oophorectomy. Other: No significant volume of ascites. No pneumoperitoneum. Tiny umbilical hernia containing only omental fat incidentally noted. Musculoskeletal: There are no aggressive appearing lytic or blastic lesions noted in the visualized portions of the skeleton. IMPRESSION: 1. Findings, as above, concerning for very mild acute diverticulitis in the mid sigmoid colon. No signs of diverticular abscess or evidence of frank perforation at this time. 2. No other acute findings noted in the abdomen or pelvis. 3. Status post cholecystectomy. Mild intra and extrahepatic biliary ductal dilatation is favored to reflect benign post cholecystectomy physiology, but correlation with liver function tests may be appropriate. 4. 1.5 cm right adrenal adenoma incidentally noted. 5. Small umbilical hernia containing only omental fat. No  signs of associated bowel incarceration or obstruction at this time. 6. Atherosclerosis. 7. Additional incidental findings, as above. Electronically Signed   By: Vinnie Langton M.D.   On: 05/17/2015 09:13    CBC  Recent Labs Lab 05/27/15 2026 05/28/15 0522 05/29/15 0452  WBC 10.9* 11.2* 7.6  HGB 10.5* 9.6* 9.2*  HCT 33.0* 30.6* 30.0*  PLT 322 302 286  MCV 87.5 87.9 88.8  MCH 27.9 27.6 27.2  MCHC 31.8 31.4 30.7  RDW 13.6 13.6 13.8  LYMPHSABS  --  2.6  --   MONOABS  --  0.6  --   EOSABS  --  0.5  --   BASOSABS  --  0.0  --     Chemistries   Recent Labs Lab 05/27/15 2026 05/28/15 0522 05/29/15 0452  NA 142 142 142  K 2.7* 3.1* 3.4*  CL 107 108 109  CO2 27 27 27   GLUCOSE 117* 95 93  BUN 6 5* <5*  CREATININE 0.68 0.68 0.78   CALCIUM 9.0 8.7* 8.6*  MG  --  1.7  --   AST 14* 22  --   ALT 13* 15  --   ALKPHOS 77 81  --   BILITOT 0.6 0.6  --    ------------------------------------------------------------------------------------------------------------------ estimated creatinine clearance is 81.1 mL/min (by C-G formula based on Cr of 0.78). ------------------------------------------------------------------------------------------------------------------ No results for input(s): HGBA1C in the last 72 hours. ------------------------------------------------------------------------------------------------------------------ No results for input(s): CHOL, HDL, LDLCALC, TRIG, CHOLHDL, LDLDIRECT in the last 72 hours. ------------------------------------------------------------------------------------------------------------------ No results for input(s): TSH, T4TOTAL, T3FREE, THYROIDAB in the last 72 hours.  Invalid input(s): FREET3 ------------------------------------------------------------------------------------------------------------------ No results for input(s): VITAMINB12, FOLATE, FERRITIN, TIBC, IRON, RETICCTPCT in the last 72 hours.  Coagulation profile No results for input(s): INR, PROTIME in the last 168 hours.  No results for input(s): DDIMER in the last 72 hours.  Cardiac Enzymes No results for input(s): CKMB, TROPONINI, MYOGLOBIN in the last 168 hours.  Invalid input(s): CK ------------------------------------------------------------------------------------------------------------------ Invalid input(s): [place]  05/28/15 0730 05/28/15 1606 05/28/15 1915 05/29/15 0017 05/29/15 0325 05/29/15 0748  GLUCAP 93 111* 91 41 86 104     RAI,RIPUDEEP M.D. [facility] 05/29/2015, 12:01 PM  Pager: [phone] Between 7am to 7pm - call Pager - [phone]  After 7pm go to [email or web address] - password [email or web address]  Call night coverage person covering after 7pm

## 2015-05-29 NOTE — Consult Note (Signed)
[facility] Consult Note  Referring Provider: No ref. provider found Primary Care Physician:  Osborne Casco, MD Primary Gastroenterologist:  Dr.  Laurel Dimmer Complaint: Left lower quadrant abdominal pain nausea and vomiting HPI: Sharon [name] is an 69 y.o. white female  who presents with relapse of left lower quadrant pain and tenderness coming by nausea and vomiting 6 days after being diagnosed with diverticulitis by clinical presentation and CT scan and having been started on Augmentin. She had a low potassium at 2.7 a borderline elevated white blood cell count and no fever upon presentation but had not taken any for medicines in 3 days including insulins. Repeat CT scan showed slight worsening of sigmoid diverticulitis without any abscess or air in the bowel wall. She was started on IV Cipro and Flagyl and after 24 hours is already feeling much better. She is not had any vomiting since yesterday but is only been sipping on clear liquids.  Past Medical History  Diagnosis Date  . Diabetes mellitus   . Hypertension   . CHF (congestive heart failure) ([place])   . Asthma   . Osteoarthritis   . Anxiety   . GERD (gastroesophageal reflux disease)   . Kidney stones   . Hypercholesteremia   . Thrombophlebitis     Past Surgical History  Procedure Laterality Date  . Appendectomy    . Cholecystectomy    . Breast surgery    . Abdominal hysterectomy    . Carpal tunnel release    . Incision and drainage perirectal abscess    . Colonoscopy    . Esophagoscopy    . Pyelogram      Medications Prior to Admission  Medication Sig Dispense Refill  . aspirin EC 81 MG tablet Take 81 mg by mouth daily.      . beta carotene w/minerals (OCUVITE) tablet Take 1 tablet by mouth daily.    . Calcium Carbonate-Vitamin D (CALTRATE 600+D) 600-400 MG-UNIT per tablet Take 1 tablet by mouth 2 (two) times daily.     Marland Kitchen dexlansoprazole (DEXILANT) 60 MG capsule Take 60 mg by mouth daily.      Marland Kitchen  Dextromethorphan-Guaifenesin 60-1200 MG per 12 hr tablet Take 1 tablet by mouth 2 (two) times daily as needed (CONGESTION).     . fish oil-omega-3 fatty acids 1000 MG capsule Take 1 g by mouth daily.      . furosemide (LASIX) 40 MG tablet Take 40 mg by mouth 2 (two) times daily.      Marland Kitchen gabapentin (NEURONTIN) 300 MG capsule Take 600 mg by mouth 2 (two) times daily.     . hydrocortisone cream 0.5 % Apply 1 application topically 2 (two) times daily as needed for itching.    Marland Kitchen ibuprofen (ADVIL,MOTRIN) 200 MG tablet Take 800 mg by mouth every 6 (six) hours as needed for mild pain. For pain     . Insulin Degludec (TRESIBA FLEXTOUCH [place]) Inject 120 Units into the skin daily.    Marland Kitchen lisinopril-hydrochlorothiazide (PRINZIDE,ZESTORETIC) 20-12.5 MG per tablet Take 2 tablets by mouth daily.     Marland Kitchen loratadine (CLARITIN) 10 MG tablet Take 10 mg by mouth daily.      . meloxicam (MOBIC) 15 MG tablet Take 15 mg by mouth daily.      . metoprolol succinate (TOPROL XL) 25 MG 24 hr tablet Take 1 tablet (25 mg total) by mouth daily. 30 tablet 11  . ondansetron (ZOFRAN) 4 MG tablet Take 4-8 mg by mouth every 8 (eight) hours as needed  for nausea or vomiting. 4-70m prn  0  . PARoxetine (PAXIL) 40 MG tablet Take 60 mg by mouth every morning.    . polyethylene glycol (MIRALAX / GLYCOLAX) packet Take 17 g by mouth daily as needed for mild constipation.    . simvastatin (ZOCOR) 40 MG tablet Take 40 mg by mouth every morning.     . sitaGLIPtin (JANUVIA) 50 MG tablet Take 50 mg by mouth daily.      Allergies:  Allergies  Allergen Reactions  . Crestor [Rosuvastatin] Other (See Comments)    Myalgias  . Indomethacin Other (See Comments)    dizziness  . Invanz [Ertapenem Sodium] Hives  . Lipitor [Atorvastatin Calcium] Other (See Comments)    Myalgias     Family History  Problem Relation Age of Onset  . Heart attack Mother     8 HEART ATTACKS  . Stroke Mother     3 STROKES  . Heart attack Father   . Stroke Father      Social History:  reports that she has never smoked. She does not have any smokeless tobacco history on file. She reports that she drinks alcohol. She reports that she does not use illicit drugs.  Review of Systems: negative except as above   Blood pressure 156/65, pulse 65, temperature 98.5 F (36.9 C), temperature source Oral, resp. rate 20, height '4\' 11"'  (1.499 m), weight 123.424 kg (272 lb 1.6 oz), SpO2 94 %. Head: Normocephalic, without obvious abnormality, atraumatic Neck: no adenopathy, no carotid bruit, no JVD, supple, symmetrical, trachea midline and thyroid not enlarged, symmetric, no tenderness/mass/nodules Resp: clear to auscultation bilaterally Cardio: regular rate and rhythm, S1, S2 normal, no murmur, click, rub or gallop GI: Fairly marked tenderness with guarding in the left lower quadrant, no rebound Extremities: extremities normal, atraumatic, no cyanosis or edema  Results for orders placed or performed during the hospital encounter of 05/27/15 (from the past 48 hour(s))  Lipase, blood     Status: None   Collection Time: 05/27/15  8:26 PM  Result Value Ref Range   Lipase 20 11 - 51 U/L  Comprehensive metabolic panel     Status: Abnormal   Collection Time: 05/27/15  8:26 PM  Result Value Ref Range   Sodium 142 135 - 145 mmol/L   Potassium 2.7 (LL) 3.5 - 5.1 mmol/L    Comment: CRITICAL RESULT CALLED TO, READ BACK BY AND VERIFIED WITH: HOUSAND R,RN 05/27/15 2110 WAYK    Chloride 107 101 - 111 mmol/L   CO2 27 22 - 32 mmol/L   Glucose, Bld 117 (H) 65 - 99 mg/dL   BUN 6 6 - 20 mg/dL   Creatinine, Ser 0.68 0.44 - 1.00 mg/dL   Calcium 9.0 8.9 - 10.3 mg/dL   Total Protein 6.5 6.5 - 8.1 g/dL   Albumin 3.1 (L) 3.5 - 5.0 g/dL   AST 14 (L) 15 - 41 U/L   ALT 13 (L) 14 - 54 U/L   Alkaline Phosphatase 77 38 - 126 U/L   Total Bilirubin 0.6 0.3 - 1.2 mg/dL   GFR calc non Af Amer >60 >60 mL/min   GFR calc Af Amer >60 >60 mL/min    Comment: (NOTE) The eGFR has been  calculated using the CKD EPI equation. This calculation has not been validated in all clinical situations. eGFR's persistently <60 mL/min signify possible Chronic Kidney Disease.    Anion gap 8 5 - 15  CBC     Status: Abnormal  Collection Time: 05/27/15  8:26 PM  Result Value Ref Range   WBC 10.9 (H) 4.0 - 10.5 K/uL   RBC 3.77 (L) 3.87 - 5.11 MIL/uL   Hemoglobin 10.5 (L) 12.0 - 15.0 g/dL   HCT 33.0 (L) 36.0 - 46.0 %   MCV 87.5 78.0 - 100.0 fL   MCH 27.9 26.0 - 34.0 pg   MCHC 31.8 30.0 - 36.0 g/dL   RDW 13.6 11.5 - 15.5 %   Platelets 322 150 - 400 K/uL  Urinalysis, Routine w reflex microscopic (not at [facility])     Status: Abnormal   Collection Time: 05/28/15  1:22 AM  Result Value Ref Range   Color, Urine AMBER (A) YELLOW    Comment: BIOCHEMICALS MAY BE AFFECTED BY COLOR   APPearance CLEAR CLEAR   Specific Gravity, Urine 1.042 (H) 1.005 - 1.030   pH 7.0 5.0 - 8.0   Glucose, UA NEGATIVE NEGATIVE mg/dL   Hgb urine dipstick NEGATIVE NEGATIVE   Bilirubin Urine NEGATIVE NEGATIVE   Ketones, ur 15 (A) NEGATIVE mg/dL   Protein, ur 30 (A) NEGATIVE mg/dL   Urobilinogen, UA 1.0 0.0 - 1.0 mg/dL   Nitrite NEGATIVE NEGATIVE   Leukocytes, UA NEGATIVE NEGATIVE  Urine microscopic-add on     Status: Abnormal   Collection Time: 05/28/15  1:22 AM  Result Value Ref Range   Squamous Epithelial / LPF FEW (A) RARE   WBC, UA 0-2 <3 WBC/hpf   RBC / HPF 0-2 <3 RBC/hpf   Bacteria, UA RARE RARE  Glucose, capillary     Status: None   Collection Time: 05/28/15  4:05 AM  Result Value Ref Range   Glucose-Capillary 91 65 - 99 mg/dL   Comment 1 Notify RN    Comment 2 Document in Chart   Comprehensive metabolic panel     Status: Abnormal   Collection Time: 05/28/15  5:22 AM  Result Value Ref Range   Sodium 142 135 - 145 mmol/L   Potassium 3.1 (L) 3.5 - 5.1 mmol/L   Chloride 108 101 - 111 mmol/L   CO2 27 22 - 32 mmol/L   Glucose, Bld 95 65 - 99 mg/dL   BUN 5 (L) 6 - 20 mg/dL   Creatinine, Ser 0.68  0.44 - 1.00 mg/dL   Calcium 8.7 (L) 8.9 - 10.3 mg/dL   Total Protein 5.6 (L) 6.5 - 8.1 g/dL   Albumin 2.8 (L) 3.5 - 5.0 g/dL   AST 22 15 - 41 U/L   ALT 15 14 - 54 U/L   Alkaline Phosphatase 81 38 - 126 U/L   Total Bilirubin 0.6 0.3 - 1.2 mg/dL   GFR calc non Af Amer >60 >60 mL/min   GFR calc Af Amer >60 >60 mL/min    Comment: (NOTE) The eGFR has been calculated using the CKD EPI equation. This calculation has not been validated in all clinical situations. eGFR's persistently <60 mL/min signify possible Chronic Kidney Disease.    Anion gap 7 5 - 15  CBC WITH DIFFERENTIAL     Status: Abnormal   Collection Time: 05/28/15  5:22 AM  Result Value Ref Range   WBC 11.2 (H) 4.0 - 10.5 K/uL   RBC 3.48 (L) 3.87 - 5.11 MIL/uL   Hemoglobin 9.6 (L) 12.0 - 15.0 g/dL   HCT 30.6 (L) 36.0 - 46.0 %   MCV 87.9 78.0 - 100.0 fL   MCH 27.6 26.0 - 34.0 pg   MCHC 31.4 30.0 - 36.0 g/dL  RDW 13.6 11.5 - 15.5 %   Platelets 302 150 - 400 K/uL   Neutrophils Relative % 67 %   Neutro Abs 7.5 1.7 - 7.7 K/uL   Lymphocytes Relative 23 %   Lymphs Abs 2.6 0.7 - 4.0 K/uL   Monocytes Relative 6 %   Monocytes Absolute 0.6 0.1 - 1.0 K/uL   Eosinophils Relative 4 %   Eosinophils Absolute 0.5 0.0 - 0.7 K/uL   Basophils Relative 0 %   Basophils Absolute 0.0 0.0 - 0.1 K/uL  Magnesium     Status: None   Collection Time: 05/28/15  5:22 AM  Result Value Ref Range   Magnesium 1.7 1.7 - 2.4 mg/dL  Glucose, capillary     Status: None   Collection Time: 05/28/15  7:30 AM  Result Value Ref Range   Glucose-Capillary 93 65 - 99 mg/dL  C difficile quick scan w PCR reflex     Status: None   Collection Time: 05/28/15 10:35 AM  Result Value Ref Range   C Diff antigen NEGATIVE NEGATIVE   C Diff toxin NEGATIVE NEGATIVE   C Diff interpretation Negative for toxigenic C. difficile   Glucose, capillary     Status: Abnormal   Collection Time: 05/28/15  4:06 PM  Result Value Ref Range   Glucose-Capillary 111 (H) 65 - 99 mg/dL   Glucose, capillary     Status: None   Collection Time: 05/28/15  7:15 PM  Result Value Ref Range   Glucose-Capillary 91 65 - 99 mg/dL   Comment 1 Notify RN   Glucose, capillary     Status: None   Collection Time: 05/29/15 12:17 AM  Result Value Ref Range   Glucose-Capillary 80 65 - 99 mg/dL   Comment 1 Notify RN   Glucose, capillary     Status: None   Collection Time: 05/29/15  3:25 AM  Result Value Ref Range   Glucose-Capillary 86 65 - 99 mg/dL   Comment 1 Notify RN   Basic metabolic panel     Status: Abnormal   Collection Time: 05/29/15  4:52 AM  Result Value Ref Range   Sodium 142 135 - 145 mmol/L   Potassium 3.4 (L) 3.5 - 5.1 mmol/L   Chloride 109 101 - 111 mmol/L   CO2 27 22 - 32 mmol/L   Glucose, Bld 93 65 - 99 mg/dL   BUN <5 (L) 6 - 20 mg/dL   Creatinine, Ser 0.78 0.44 - 1.00 mg/dL   Calcium 8.6 (L) 8.9 - 10.3 mg/dL   GFR calc non Af Amer >60 >60 mL/min   GFR calc Af Amer >60 >60 mL/min    Comment: (NOTE) The eGFR has been calculated using the CKD EPI equation. This calculation has not been validated in all clinical situations. eGFR's persistently <60 mL/min signify possible Chronic Kidney Disease.    Anion gap 6 5 - 15  CBC     Status: Abnormal   Collection Time: 05/29/15  4:52 AM  Result Value Ref Range   WBC 7.6 4.0 - 10.5 K/uL   RBC 3.38 (L) 3.87 - 5.11 MIL/uL   Hemoglobin 9.2 (L) 12.0 - 15.0 g/dL   HCT 30.0 (L) 36.0 - 46.0 %   MCV 88.8 78.0 - 100.0 fL   MCH 27.2 26.0 - 34.0 pg   MCHC 30.7 30.0 - 36.0 g/dL   RDW 13.8 11.5 - 15.5 %   Platelets 286 150 - 400 K/uL  Glucose, capillary     Status:  None   Collection Time: 05/29/15  7:48 AM  Result Value Ref Range   Glucose-Capillary 83 65 - 99 mg/dL   Ct Abdomen Pelvis W Contrast  05/27/2015  CLINICAL DATA:  Vomiting, nausea, and diarrhea. Recent diagnosis of diverticulitis post 7 days of antibiotics. Diverticulitis versus developed abscess. EXAM: CT ABDOMEN AND PELVIS WITH CONTRAST TECHNIQUE:  Multidetector CT imaging of the abdomen and pelvis was performed using the standard protocol following bolus administration of intravenous contrast. CONTRAST:  133m OMNIPAQUE IOHEXOL 300 MG/ML  SOLN COMPARISON:  CT 05/17/2015 FINDINGS: Lower chest: Motion artifact through the lung bases. No pleural effusion or consolidation. Liver: No focal hepatic lesion. Hepatobiliary: Postcholecystectomy with stable intra and extrahepatic biliary prominence. Pancreas: Mild fatty atrophy of the head and body. No ductal dilatation or surrounding inflammation. Spleen: Normal. Adrenal glands: 1.5 cm right adrenal adenoma unchanged. Left adrenal gland is normal. Kidneys: Symmetric renal enhancement. No hydronephrosis. Exophytic cyst from the upper right kidney measures 4.9 cm. Additional smaller cortical cysts are seen in both kidneys, unchanged. Stomach/Bowel: Stomach physiologically distended. There are no dilated or thickened small bowel loops. Persistent inflammatory change in involving the sigmoid colon consistent with diverticulitis, with slight progression in degree of pericolonic fat stranding. There is no perforation or abscess. Moderate volume of stool throughout the more proximal colon without proximal colonic wall thickening. The appendix is surgically absent. Vascular/Lymphatic: No retroperitoneal adenopathy. The borderline celiac axis lymph node measuring 8 mm is unchanged. Abdominal aorta is normal in caliber. Mild atherosclerosis without aneurysm. Reproductive: Uterus surgically absent.  No adnexal mass. Bladder: Physiologically distended, no wall thickening. Other: No free air, free fluid, or intra-abdominal fluid collection. Fat containing umbilical hernia with mild surrounding edema in the anterior abdominal wall, appears chronic. Musculoskeletal: There are no acute or suspicious osseous abnormalities. Stable degenerative change in the spine. Hemi transitional lumbosacral anatomy. IMPRESSION: 1. Persistent or  recurrent diverticulitis in the sigmoid colon, slightly increased in degree of inflammation from prior exam. There is however no perforation, abscess or complication. 2. Additional chronic findings are stable. Electronically Signed   By: MJeb LeveringM.D.   On: 05/27/2015 23:35    Assessment: Persistent sigmoid diverticulitis, failing outpatient antibiotics. Plan:  Agree with IV Cipro and Flagyl, bowel rest, advance diet when tolerated. Alexsandro Salek C 05/29/2015, 10:25 AM  Pager [phone] no answer or after 5 PM call [phone]

## 2015-05-30 LAB — BASIC METABOLIC PANEL
Anion gap: 8 (ref 5–15)
BUN: 7 mg/dL (ref 6–20)
CO2: 26 mmol/L (ref 22–32)
CREATININE: 0.8 mg/dL (ref 0.44–1.00)
Calcium: 8.8 mg/dL — ABNORMAL LOW (ref 8.9–10.3)
Chloride: 109 mmol/L (ref 101–111)
Glucose, Bld: 153 mg/dL — ABNORMAL HIGH (ref 65–99)
POTASSIUM: 3.7 mmol/L (ref 3.5–5.1)
SODIUM: 143 mmol/L (ref 135–145)

## 2015-05-30 LAB — GLUCOSE, CAPILLARY
GLUCOSE-CAPILLARY: 138 mg/dL — AB (ref 65–99)
Glucose-Capillary: 131 mg/dL — ABNORMAL HIGH (ref 65–99)
Glucose-Capillary: 148 mg/dL — ABNORMAL HIGH (ref 65–99)
Glucose-Capillary: 158 mg/dL — ABNORMAL HIGH (ref 65–99)
Glucose-Capillary: 159 mg/dL — ABNORMAL HIGH (ref 65–99)
Glucose-Capillary: 167 mg/dL — ABNORMAL HIGH (ref 65–99)

## 2015-05-30 LAB — CBC
HCT: 31.4 % — ABNORMAL LOW (ref 36.0–46.0)
Hemoglobin: 9.7 g/dL — ABNORMAL LOW (ref 12.0–15.0)
MCH: 27.6 pg (ref 26.0–34.0)
MCHC: 30.9 g/dL (ref 30.0–36.0)
MCV: 89.5 fL (ref 78.0–100.0)
PLATELETS: 338 10*3/uL (ref 150–400)
RBC: 3.51 MIL/uL — ABNORMAL LOW (ref 3.87–5.11)
RDW: 13.7 % (ref 11.5–15.5)
WBC: 9.9 10*3/uL (ref 4.0–10.5)

## 2015-05-30 MED ORDER — POLYETHYLENE GLYCOL 3350 17 G PO PACK
17.0000 g | PACK | ORAL | Status: DC
  Administered 2015-05-30 – 2015-05-31 (×6): 17 g via ORAL
  Filled 2015-05-30 (×7): qty 1

## 2015-05-30 MED ORDER — CHLORHEXIDINE GLUCONATE 0.12 % MT SOLN
15.0000 mL | Freq: Two times a day (BID) | OROMUCOSAL | Status: DC
  Administered 2015-05-30 – 2015-06-02 (×5): 15 mL via OROMUCOSAL
  Filled 2015-05-30 (×7): qty 15

## 2015-05-30 MED ORDER — CETYLPYRIDINIUM CHLORIDE 0.05 % MT LIQD
7.0000 mL | Freq: Two times a day (BID) | OROMUCOSAL | Status: DC
Start: 2015-05-30 — End: 2015-05-30

## 2015-05-30 MED ORDER — CETYLPYRIDINIUM CHLORIDE 0.05 % MT LIQD
7.0000 mL | Freq: Two times a day (BID) | OROMUCOSAL | Status: DC
Start: 2015-05-30 — End: 2015-05-30
  Administered 2015-05-30: 7 mL via OROMUCOSAL

## 2015-05-30 MED ORDER — CETYLPYRIDINIUM CHLORIDE 0.05 % MT LIQD
7.0000 mL | Freq: Two times a day (BID) | OROMUCOSAL | Status: DC
  Administered 2015-05-31 – 2015-06-02 (×6): 7 mL via OROMUCOSAL

## 2015-05-30 MED ORDER — CHLORHEXIDINE GLUCONATE 0.12 % MT SOLN
OROMUCOSAL | Status: AC
  Filled 2015-05-30: qty 15

## 2015-05-30 MED ORDER — CHLORHEXIDINE GLUCONATE 0.12 % MT SOLN: 15.0000 mL | Freq: Two times a day (BID) | OROMUCOSAL | Status: DC

## 2015-05-30 NOTE — Progress Notes (Signed)
[facility] PROGRESS NOTE  Sharon [name] [id number] DOB: August 01, 1945 DOA: 05/27/2015  PCP: Osborne Casco, MD  Brief HPI: 69yo Caucasian female with past medical history of type 2 diabetes, hypertension, hyperlipidemia, sleep apnea, presented with nausea, vomiting, diarrhea and abdominal pain. She underwent a CT scan which revealed sigmoid diverticulitis. She was admitted for further management.  Past medical history:  Past Medical History  Diagnosis Date  . Diabetes mellitus   . Hypertension   . CHF (congestive heart failure) ([place])   . Asthma   . Osteoarthritis   . Anxiety   . GERD (gastroesophageal reflux disease)   . Kidney stones   . Hypercholesteremia   . Thrombophlebitis     Consultants: [facility]  Procedures: none  Antibiotics: On ciprofloxacin and Flagyl  Subjective: Patient continues to feel poorly. Continues to have left-sided abdominal pain. Still nauseated. Unable to tolerate even clear liquids.  Objective: Vital Signs  Filed Vitals:   05/29/15 0511 05/29/15 0542 05/29/15 2059 05/30/15 0447  BP: 176/67 156/65 167/79 177/68  Pulse: 65  66 64  Temp: 98.5 F (36.9 C)  98.1 F (36.7 C) 98.6 F (37 C)  TempSrc: Oral  Oral Oral  Resp: 20  18 20   Height:      Weight: 123.424 kg (272 lb 1.6 oz)   123.968 kg (273 lb 4.8 oz)  SpO2: 94%  96% 95%    Intake/Output Summary (Last 24 hours) at 05/30/15 0923 Last data filed at 05/30/15 0555  Gross per 24 hour  Intake 1801.66 ml  Output      0 ml  Net 1801.66 ml   Filed Weights   05/28/15 0218 05/29/15 0511 05/30/15 0447  Weight: 122.7 kg (270 lb 8.1 oz) 123.424 kg (272 lb 1.6 oz) 123.968 kg (273 lb 4.8 oz)    General appearance: alert, cooperative, appears stated age and no distress Resp: clear to auscultation bilaterally Cardio: regular rate and rhythm, S1, S2 normal, no murmur, click, rub or gallop GI: abdomen soft. Tender in the left lower quadrant without any rebound,  rigidity. Mild guarding is present. No masses or organomegaly. Bowel sounds are heard. Extremities: extremities normal, atraumatic, no cyanosis or edema Neurologic: no focal deficits  Lab Results:  Basic Metabolic Panel:  Recent Labs Lab 05/27/15 2026 05/28/15 0522 05/29/15 0452 05/30/15 0250  NA 142 142 142 143  K 2.7* 3.1* 3.4* 3.7  CL 107 108 109 109  CO2 27 27 27 26   GLUCOSE 117* 95 93 153*  BUN 6 5* <5* 7  CREATININE 0.68 0.68 0.78 0.80  CALCIUM 9.0 8.7* 8.6* 8.8*  MG  --  1.7  --   --    Liver Function Tests:  Recent Labs Lab 05/27/15 2026 05/28/15 0522  AST 14* 22  ALT 13* 15  ALKPHOS 77 81  BILITOT 0.6 0.6  PROT 6.5 5.6*  ALBUMIN 3.1* 2.8*    Recent Labs Lab 05/27/15 2026  LIPASE 20   CBC:  Recent Labs Lab 05/27/15 2026 05/28/15 0522 05/29/15 0452 05/30/15 0250  WBC 10.9* 11.2* 7.6 9.9  NEUTROABS  --  7.5  --   --   HGB 10.5* 9.6* 9.2* 9.7*  HCT 33.0* 30.6* 30.0* 31.4*  MCV 87.5 87.9 88.8 89.5  PLT 322 302 286 338   CBG:  Recent Labs Lab 05/29/15 1604 05/29/15 2023 05/30/15 0009 05/30/15 0354 05/30/15 0801  GLUCAP 98 146* 158* 138* 131*    Recent Results (from the past 240 hour(s))  C  difficile quick scan w PCR reflex     Status: None   Collection Time: 05/28/15 10:35 AM  Result Value Ref Range Status   C Diff antigen NEGATIVE NEGATIVE Final   C Diff toxin NEGATIVE NEGATIVE Final   C Diff interpretation Negative for toxigenic C. difficile  Final      Studies/Results: Dg Abd 2 Views  2015/06/27  CLINICAL DATA:  Vomiting and diffuse abdominal tenderness. Sigmoid colon wall thickening on recent CT. EXAM: ABDOMEN - 2 VIEW COMPARISON:  05/27/2015 CT abdomen/ pelvis. FINDINGS: No disproportionately dilated small bowel loops or air-fluid levels. There is a large amount of stool and retained oral contrast throughout the colon. No evidence of pneumatosis or pneumoperitoneum. No pathologic soft tissue calcifications. Moderate  degenerative changes throughout the visualized thoracolumbar spine. IMPRESSION: 1. No evidence of small-bowel obstruction. 2. Large amount of stool and retained oral contrast throughout the colon. Findings could indicate a partial distal colonic obstruction at the level of the sigmoid colon wall thickening seen on the 05/27/2015 CT study. Please note that the sigmoid colon wall thickening seen on the 05/27/2015 CT study is nonspecific and a neoplastic etiology cannot be excluded. Correlation with colonoscopy should be considered. Electronically Signed   By: Ilona Sorrel M.D.   On: 06/27/2015 20:25    Medications:  Scheduled: . ciprofloxacin  400 mg Intravenous Q12H  . enoxaparin (LOVENOX) injection  60 mg Subcutaneous Q24H  . insulin aspart  0-9 Units Subcutaneous Q4H  . lisinopril  20 mg Oral Daily  . loratadine  10 mg Oral Daily  . metoprolol succinate  25 mg Oral Daily  . metronidazole  500 mg Intravenous Q8H  . PARoxetine  60 mg Oral Daily  . polyethylene glycol  17 g Oral 6 times per day   Continuous: . dextrose 5 % and 0.45% NaCl 100 mL/hr at 05/30/15 0810   [id number] **OR** acetaminophen, diphenhydrAMINE, hydrALAZINE, HYDROmorphone (DILAUDID) injection, [DISCONTINUED] ondansetron **OR** ondansetron (ZOFRAN) IV, promethazine  Assessment/Plan:  Principal Problem:   Sigmoid diverticulitis Active Problems:   Hypertension   Diverticulitis   Hypokalemia   Diabetes mellitus type 2, controlled (HCC)   Chronic anemia   Diverticulitis of large intestine without perforation or abscess without bleeding    Acute Sigmoid diverticulitis CT abdomen showed persistent or recurrent diverticulitis in the sigmoid colon, slightly increased in the degree of inflammation from the prior examination. No abscess or perforation. Patient was placed on ciprofloxacin and Flagyl. She has been very slow to improve. Continues to have nausea. Abdominal films done Jun 27, 2023 suggested a distal colonic  obstruction. GI is following. Await their input. C. difficile negative.  History of essential Hypertension Currently stable.   Hypokalemia Replaced   Diabetes mellitus type 2, controlled Continue sliding scale insulin.   Chronic anemia H&H stable   History of chronic diastolic CHF, grade 2 diastolic dysfunction 2-D echo in 04/2013 had shown EF of 55-60% with grade 2 diastolic dysfunction, follow I's and O's closely. Currently well compensated.   DVT Prophylaxis: Lovenox    Code Status: full code  Family Communication: discussed with the patient  Disposition Plan: await improvement.    LOS: 2 days   [facility] Pager [phone] 05/30/2015, 9:23 AM  If 7PM-7AM, please contact night-coverage at [email or web address], password [facility]

## 2015-05-30 NOTE — Progress Notes (Signed)
[facility] Progress Note  Subjective: Patient doing worse suggest her today with recurrent nausea and vomiting despite little by mouth intake. KUB was obtained which showed large amount of stool proximal to the sigmoid colon as well as retained contrast from CT scan on November 13. She is passing small amount of stool. She still feels nauseated and has requested an NG tube.  Objective: Vital signs in last 24 hours: Temp:  [98.1 F (36.7 C)-98.6 F (37 C)] 98.6 F (37 C) (11/16 0447) Pulse Rate:  [64-66] 64 (11/16 0447) Resp:  [18-20] 20 (11/16 0447) BP: (167-177)/(68-79) 177/68 mmHg (11/16 0447) SpO2:  [95 %-96 %] 95 % (11/16 0447) Weight:  [123.968 kg (273 lb 4.8 oz)] 123.968 kg (273 lb 4.8 oz) (11/16 0447) Weight change: 0.544 kg (1 lb 3.2 oz)   PE: Unchanged.  Lab Results: Results for orders placed or performed during the hospital encounter of 05/27/15 (from the past 24 hour(s))  Glucose, capillary     Status: None   Collection Time: Jun 08, 2015 11:50 AM  Result Value Ref Range   Glucose-Capillary 87 65 - 99 mg/dL  Glucose, capillary     Status: None   Collection Time: 2015-06-08  4:04 PM  Result Value Ref Range   Glucose-Capillary 98 65 - 99 mg/dL  Glucose, capillary     Status: Abnormal   Collection Time: 06-08-15  8:23 PM  Result Value Ref Range   Glucose-Capillary 146 (H) 65 - 99 mg/dL   Comment 1 Notify RN   Glucose, capillary     Status: Abnormal   Collection Time: 05/30/15 12:09 AM  Result Value Ref Range   Glucose-Capillary 158 (H) 65 - 99 mg/dL   Comment 1 Notify RN   Basic metabolic panel     Status: Abnormal   Collection Time: 05/30/15  2:50 AM  Result Value Ref Range   Sodium 143 135 - 145 mmol/L   Potassium 3.7 3.5 - 5.1 mmol/L   Chloride 109 101 - 111 mmol/L   CO2 26 22 - 32 mmol/L   Glucose, Bld 153 (H) 65 - 99 mg/dL   BUN 7 6 - 20 mg/dL   Creatinine, Ser 0.80 0.44 - 1.00 mg/dL   Calcium 8.8 (L) 8.9 - 10.3 mg/dL   GFR calc non Af Amer >60  >60 mL/min   GFR calc Af Amer >60 >60 mL/min   Anion gap 8 5 - 15  CBC     Status: Abnormal   Collection Time: 05/30/15  2:50 AM  Result Value Ref Range   WBC 9.9 4.0 - 10.5 K/uL   RBC 3.51 (L) 3.87 - 5.11 MIL/uL   Hemoglobin 9.7 (L) 12.0 - 15.0 g/dL   HCT 31.4 (L) 36.0 - 46.0 %   MCV 89.5 78.0 - 100.0 fL   MCH 27.6 26.0 - 34.0 pg   MCHC 30.9 30.0 - 36.0 g/dL   RDW 13.7 11.5 - 15.5 %   Platelets 338 150 - 400 K/uL  Glucose, capillary     Status: Abnormal   Collection Time: 05/30/15  3:54 AM  Result Value Ref Range   Glucose-Capillary 138 (H) 65 - 99 mg/dL   Comment 1 Notify RN   Glucose, capillary     Status: Abnormal   Collection Time: 05/30/15  8:01 AM  Result Value Ref Range   Glucose-Capillary 131 (H) 65 - 99 mg/dL    Studies/Results: Dg Abd 2 Views  06/08/2015  CLINICAL DATA:  Vomiting and diffuse abdominal tenderness.  Sigmoid colon wall thickening on recent CT. EXAM: ABDOMEN - 2 VIEW COMPARISON:  05/27/2015 CT abdomen/ pelvis. FINDINGS: No disproportionately dilated small bowel loops or air-fluid levels. There is a large amount of stool and retained oral contrast throughout the colon. No evidence of pneumatosis or pneumoperitoneum. No pathologic soft tissue calcifications. Moderate degenerative changes throughout the visualized thoracolumbar spine. IMPRESSION: 1. No evidence of small-bowel obstruction. 2. Large amount of stool and retained oral contrast throughout the colon. Findings could indicate a partial distal colonic obstruction at the level of the sigmoid colon wall thickening seen on the 05/27/2015 CT study. Please note that the sigmoid colon wall thickening seen on the 05/27/2015 CT study is nonspecific and a neoplastic etiology cannot be excluded. Correlation with colonoscopy should be considered. Electronically Signed   By: Ilona Sorrel M.D.   On: 05/29/2015 20:25      Assessment: 1. Diverticulitis 2. Possible partial colonic obstruction as suggested by  KUB  Plan: 1. NG tube placement to intermittent suction. 2. Mira lax 17 g every 4 hours per NG. 3. Continue Cipro and Flagyl 4. Follow-up KUB in 2 days.    Berish Bohman C 05/30/2015, 11:38 AM  Pager [phone] If no answer or after 5 PM call [phone]

## 2015-05-31 ENCOUNTER — Inpatient Hospital Stay (HOSPITAL_COMMUNITY): Payer: BLUE CROSS/BLUE SHIELD

## 2015-05-31 LAB — GLUCOSE, CAPILLARY
GLUCOSE-CAPILLARY: 151 mg/dL — AB (ref 65–99)
GLUCOSE-CAPILLARY: 155 mg/dL — AB (ref 65–99)
GLUCOSE-CAPILLARY: 186 mg/dL — AB (ref 65–99)
Glucose-Capillary: 149 mg/dL — ABNORMAL HIGH (ref 65–99)
Glucose-Capillary: 152 mg/dL — ABNORMAL HIGH (ref 65–99)
Glucose-Capillary: 153 mg/dL — ABNORMAL HIGH (ref 65–99)
Glucose-Capillary: 174 mg/dL — ABNORMAL HIGH (ref 65–99)

## 2015-05-31 MED ORDER — METOPROLOL TARTRATE 1 MG/ML IV SOLN
2.5000 mg | Freq: Four times a day (QID) | INTRAVENOUS | Status: DC
  Administered 2015-05-31 – 2015-06-05 (×21): 2.5 mg via INTRAVENOUS
  Filled 2015-05-31 (×20): qty 5

## 2015-05-31 MED ORDER — LORAZEPAM 2 MG/ML IJ SOLN
1.0000 mg | Freq: Once | INTRAMUSCULAR | Status: AC
  Administered 2015-05-31: 1 mg via INTRAVENOUS
  Filled 2015-05-31: qty 1

## 2015-05-31 NOTE — Progress Notes (Signed)
[facility] PROGRESS NOTE  Sharon [name] [id number] DOB: Aug 18, 1945 DOA: 05/27/2015  PCP: Osborne Casco, MD  Brief HPI: 69yo Caucasian female with past medical history of type 2 diabetes, hypertension, hyperlipidemia, sleep apnea, presented with nausea, vomiting, diarrhea and abdominal pain. She underwent a CT scan which revealed sigmoid diverticulitis. She was admitted for further management. Patient has not improved despite standard management. Repeat films suggested colonic obstruction. NG tube was placed. GI is following.  Past medical history:  Past Medical History  Diagnosis Date  . Diabetes mellitus   . Hypertension   . CHF (congestive heart failure) ([place])   . Asthma   . Osteoarthritis   . Anxiety   . GERD (gastroesophageal reflux disease)   . Kidney stones   . Hypercholesteremia   . Thrombophlebitis     Consultants: [facility]  Procedures: none  Antibiotics: On ciprofloxacin and Flagyl  Subjective: Patient states that she is no better. Continues to be nauseated. Denies any vomiting. Continues to have left-sided abdominal pain.   Objective: Vital Signs  Filed Vitals:   05/30/15 0447 05/30/15 2131 05/31/15 0559 05/31/15 1009  BP: 177/68 182/57 188/65 182/67  Pulse: 64 68 69 65  Temp: 98.6 F (37 C) 98.4 F (36.9 C) 99.7 F (37.6 C) 98.3 F (36.8 C)  TempSrc: Oral Oral  Oral  Resp: 20 19 18 18   Height:      Weight: 123.968 kg (273 lb 4.8 oz)  129.321 kg (285 lb 1.6 oz)   SpO2: 95% 96% 96% 97%    Intake/Output Summary (Last 24 hours) at 05/31/15 1239 Last data filed at 05/31/15 0941  Gross per 24 hour  Intake 2459.34 ml  Output    850 ml  Net 1609.34 ml   Filed Weights   05/29/15 0511 05/30/15 0447 05/31/15 0559  Weight: 123.424 kg (272 lb 1.6 oz) 123.968 kg (273 lb 4.8 oz) 129.321 kg (285 lb 1.6 oz)    General appearance: alert, cooperative, appears stated age and no distress Resp: clear to auscultation  bilaterally Cardio: regular rate and rhythm, S1, S2 normal, no murmur, click, rub or gallop GI: abdomen soft. Significantly tender in the left lower quadrant without any rebound, rigidity. Mild guarding is present. No masses or organomegaly. Bowel sounds are heard. Extremities: extremities normal, atraumatic, no cyanosis or edema Neurologic: no focal deficits  Lab Results:  Basic Metabolic Panel:  Recent Labs Lab 05/27/15 2026 05/28/15 0522 05/29/15 0452 05/30/15 0250  NA 142 142 142 143  K 2.7* 3.1* 3.4* 3.7  CL 107 108 109 109  CO2 27 27 27 26   GLUCOSE 117* 95 93 153*  BUN 6 5* <5* 7  CREATININE 0.68 0.68 0.78 0.80  CALCIUM 9.0 8.7* 8.6* 8.8*  MG  --  1.7  --   --    Liver Function Tests:  Recent Labs Lab 05/27/15 2026 05/28/15 0522  AST 14* 22  ALT 13* 15  ALKPHOS 77 81  BILITOT 0.6 0.6  PROT 6.5 5.6*  ALBUMIN 3.1* 2.8*    Recent Labs Lab 05/27/15 2026  LIPASE 20   CBC:  Recent Labs Lab 05/27/15 2026 05/28/15 0522 05/29/15 0452 05/30/15 0250  WBC 10.9* 11.2* 7.6 9.9  NEUTROABS  --  7.5  --   --   HGB 10.5* 9.6* 9.2* 9.7*  HCT 33.0* 30.6* 30.0* 31.4*  MCV 87.5 87.9 88.8 89.5  PLT 322 302 286 338   CBG:  Recent Labs Lab 05/30/15 2002 05/31/15 0009 05/31/15 0403  05/31/15 0803 05/31/15 1151  GLUCAP 148* 186* 151* 152* 174*    Recent Results (from the past 240 hour(s))  C difficile quick scan w PCR reflex     Status: None   Collection Time: 05/28/15 10:35 AM  Result Value Ref Range Status   C Diff antigen NEGATIVE NEGATIVE Final   C Diff toxin NEGATIVE NEGATIVE Final   C Diff interpretation Negative for toxigenic C. difficile  Final      Studies/Results: Dg Abd 2 Views  May 30, 2015  CLINICAL DATA:  Vomiting and diffuse abdominal tenderness. Sigmoid colon wall thickening on recent CT. EXAM: ABDOMEN - 2 VIEW COMPARISON:  05/27/2015 CT abdomen/ pelvis. FINDINGS: No disproportionately dilated small bowel loops or air-fluid levels. There is  a large amount of stool and retained oral contrast throughout the colon. No evidence of pneumatosis or pneumoperitoneum. No pathologic soft tissue calcifications. Moderate degenerative changes throughout the visualized thoracolumbar spine. IMPRESSION: 1. No evidence of small-bowel obstruction. 2. Large amount of stool and retained oral contrast throughout the colon. Findings could indicate a partial distal colonic obstruction at the level of the sigmoid colon wall thickening seen on the 05/27/2015 CT study. Please note that the sigmoid colon wall thickening seen on the 05/27/2015 CT study is nonspecific and a neoplastic etiology cannot be excluded. Correlation with colonoscopy should be considered. Electronically Signed   By: Ilona Sorrel M.D.   On: May 30, 2015 20:25    Medications:  Scheduled: . antiseptic oral rinse  7 mL Mouth Rinse q12n4p  . chlorhexidine  15 mL Mouth Rinse BID  . ciprofloxacin  400 mg Intravenous Q12H  . enoxaparin (LOVENOX) injection  60 mg Subcutaneous Q24H  . insulin aspart  0-9 Units Subcutaneous Q4H  . loratadine  10 mg Oral Daily  . metoprolol  2.5 mg Intravenous 4 times per day  . metronidazole  500 mg Intravenous Q8H   Continuous: . dextrose 5 % and 0.45% NaCl 100 mL/hr at 05/30/15 [id number]   [id number] **OR** acetaminophen, diphenhydrAMINE, hydrALAZINE, HYDROmorphone (DILAUDID) injection, [DISCONTINUED] ondansetron **OR** ondansetron (ZOFRAN) IV, promethazine  Assessment/Plan:  Principal Problem:   Sigmoid diverticulitis Active Problems:   Hypertension   Diverticulitis   Hypokalemia   Diabetes mellitus type 2, controlled (HCC)   Chronic anemia   Diverticulitis of large intestine without perforation or abscess without bleeding    Acute Sigmoid diverticulitis CT abdomen showed persistent or recurrent diverticulitis in the sigmoid colon, slightly increased in the degree of inflammation from the prior examination. No abscess or perforation. Patient was  placed on ciprofloxacin and Flagyl. Patient has not improved. Abdominal films suggested a distal colonic obstruction. Gastroenterology is following. NG tube was placed. Still not much improvement. Imaging studies to be repeated today. C. difficile negative.  History of essential Hypertension Blood pressure is poorly controlled. This is due to an inability to take oral medications. Initiate scheduled metoprolol intravenously. Hydralazine intravenously as needed.   Hypokalemia Replaced. Recheck labs tomorrow  Diabetes mellitus type 2, controlled Continue sliding scale insulin.   Chronic anemia Hemoglobin is stable   History of chronic diastolic CHF, grade 2 diastolic dysfunction 2-D echo in 04/2013 had shown EF of 55-60% with grade 2 diastolic dysfunction, follow I's and O's closely. Currently well compensated. Caution with aggressive hydration.   DVT Prophylaxis: Lovenox    Code Status: full code  Family Communication: discussed with the patient  Disposition Plan: continue to monitor. Patient not improving as expected.    LOS: 3 days   Drayven Marchena  [facility] Pager [phone] 05/31/2015, 12:39 PM  If 7PM-7AM, please contact night-coverage at [email or web address], password [facility]

## 2015-05-31 NOTE — Progress Notes (Signed)
[facility] Progress Note  Subjective: Feels miserable, nauseated, no abdominal pain but significant bloating, states she has had small volume stools but not very much.  Objective: Vital signs in last 24 hours: Temp:  [98.3 F (36.8 C)-99.7 F (37.6 C)] 98.3 F (36.8 C) (11/17 1009) Pulse Rate:  [65-69] 65 (11/17 1009) Resp:  [18-19] 18 (11/17 1009) BP: (182-188)/(57-67) 182/67 mmHg (11/17 1009) SpO2:  [96 %-97 %] 97 % (11/17 1009) Weight:  [129.321 kg (285 lb 1.6 oz)] 129.321 kg (285 lb 1.6 oz) (11/17 0559) Weight change: 5.352 kg (11 lb 12.8 oz)   PE: Abdomen soft distended mild diffuse tenderness  Lab Results: Results for orders placed or performed during the hospital encounter of 05/27/15 (from the past 24 hour(s))  Glucose, capillary     Status: Abnormal   Collection Time: 05/30/15 12:49 PM  Result Value Ref Range   Glucose-Capillary 167 (H) 65 - 99 mg/dL  Glucose, capillary     Status: Abnormal   Collection Time: 05/30/15  4:31 PM  Result Value Ref Range   Glucose-Capillary 159 (H) 65 - 99 mg/dL  Glucose, capillary     Status: Abnormal   Collection Time: 05/30/15  8:02 PM  Result Value Ref Range   Glucose-Capillary 148 (H) 65 - 99 mg/dL   Comment 1 Notify RN    Comment 2 Document in Chart   Glucose, capillary     Status: Abnormal   Collection Time: 05/31/15 12:09 AM  Result Value Ref Range   Glucose-Capillary 186 (H) 65 - 99 mg/dL   Comment 1 Notify RN    Comment 2 Document in Chart   Glucose, capillary     Status: Abnormal   Collection Time: 05/31/15  4:03 AM  Result Value Ref Range   Glucose-Capillary 151 (H) 65 - 99 mg/dL   Comment 1 Notify RN    Comment 2 Document in Chart   Glucose, capillary     Status: Abnormal   Collection Time: 05/31/15  8:03 AM  Result Value Ref Range   Glucose-Capillary 152 (H) 65 - 99 mg/dL   Comment 1 Notify RN     Studies/Results: Dg Abd 2 Views  2015-06-24  CLINICAL DATA:  Vomiting and diffuse abdominal  tenderness. Sigmoid colon wall thickening on recent CT. EXAM: ABDOMEN - 2 VIEW COMPARISON:  05/27/2015 CT abdomen/ pelvis. FINDINGS: No disproportionately dilated small bowel loops or air-fluid levels. There is a large amount of stool and retained oral contrast throughout the colon. No evidence of pneumatosis or pneumoperitoneum. No pathologic soft tissue calcifications. Moderate degenerative changes throughout the visualized thoracolumbar spine. IMPRESSION: 1. No evidence of small-bowel obstruction. 2. Large amount of stool and retained oral contrast throughout the colon. Findings could indicate a partial distal colonic obstruction at the level of the sigmoid colon wall thickening seen on the 05/27/2015 CT study. Please note that the sigmoid colon wall thickening seen on the 05/27/2015 CT study is nonspecific and a neoplastic etiology cannot be excluded. Correlation with colonoscopy should be considered. Electronically Signed   By: Ilona Sorrel M.D.   On: Jun 24, 2015 20:25      Assessment: Diverticulitis Possible obstruction/impaction above sigmoid colon status post 24 hours of NG suction with every 4 hours Mira lax without clinical improvement  Plan: Continue antibiotics Hold Mira lax and increase suction to continuous Repeat KUB today to assess for clearance of barium and rule out worsening obstruction.    Myleigh Amara C 05/31/2015, 11:59 AM  Pager [phone] If no answer  or after 5 PM call [phone]

## 2015-05-31 NOTE — Progress Notes (Signed)
Patient complaining of sudden onset of shortness of breath.  She requested O2. O2 sat on room air-95; with 2L [place] 99%.  Respirations 24, lung sounds diminished bilaterally. Paged on call physician for orders.

## 2015-05-31 NOTE — Care Management Note (Signed)
Case Management Note  Patient Details  Name: Sharon [name] MRN: [id number] Date of Birth: [birth year]  Subjective/Objective:                    Action/Plan:   Expected Discharge Date:                  Expected Discharge Plan:  Home/Self Care  In-House Referral:     Discharge planning Services     Post Acute Care Choice:    Choice offered to:     DME Arranged:    DME Agency:     HH Arranged:    [place] Agency:     Status of Service:  In process, will continue to follow  Medicare Important Message Given:    Date Medicare IM Given:    Medicare IM give by:    Date Additional Medicare IM Given:    Additional Medicare Important Message give by:     If discussed at [place] of Stay Meetings, dates discussed:    Additional Comments: UR updated  Marilu Favre, RN 05/31/2015, 7:44 AM

## 2015-06-01 LAB — CBC
HCT: 29.8 % — ABNORMAL LOW (ref 36.0–46.0)
Hemoglobin: 9.2 g/dL — ABNORMAL LOW (ref 12.0–15.0)
MCH: 27.5 pg (ref 26.0–34.0)
MCHC: 30.9 g/dL (ref 30.0–36.0)
MCV: 89.2 fL (ref 78.0–100.0)
PLATELETS: 281 10*3/uL (ref 150–400)
RBC: 3.34 MIL/uL — ABNORMAL LOW (ref 3.87–5.11)
RDW: 14 % (ref 11.5–15.5)
WBC: 7.1 10*3/uL (ref 4.0–10.5)

## 2015-06-01 LAB — BASIC METABOLIC PANEL
Anion gap: 8 (ref 5–15)
BUN: <5 mg/dL — ABNORMAL LOW (ref 6–20)
CALCIUM: 8.5 mg/dL — AB (ref 8.9–10.3)
CO2: 23 mmol/L (ref 22–32)
CREATININE: 0.81 mg/dL (ref 0.44–1.00)
Chloride: 107 mmol/L (ref 101–111)
GFR calc Af Amer: >60 mL/min (ref >60)
GLUCOSE: 140 mg/dL — AB (ref 65–99)
Potassium: 2.9 mmol/L — ABNORMAL LOW (ref 3.5–5.1)
Sodium: 138 mmol/L (ref 135–145)

## 2015-06-01 LAB — GLUCOSE, CAPILLARY
GLUCOSE-CAPILLARY: 138 mg/dL — AB (ref 65–99)
GLUCOSE-CAPILLARY: 141 mg/dL — AB (ref 65–99)
Glucose-Capillary: 148 mg/dL — ABNORMAL HIGH (ref 65–99)
Glucose-Capillary: 116 mg/dL — ABNORMAL HIGH (ref 65–99)
Glucose-Capillary: 119 mg/dL — ABNORMAL HIGH (ref 65–99)

## 2015-06-01 LAB — MAGNESIUM: Magnesium: 1.7 mg/dL (ref 1.7–2.4)

## 2015-06-01 MED ORDER — KCL IN DEXTROSE-NACL 20-5-0.45 MEQ/L-%-% IV SOLN
INTRAVENOUS | Status: DC
  Administered 2015-06-01 – 2015-06-04 (×5): via INTRAVENOUS
  Filled 2015-06-01 (×5): qty 1000

## 2015-06-01 MED ORDER — POTASSIUM CHLORIDE 10 MEQ/100ML IV SOLN
10.0000 meq | INTRAVENOUS | Status: AC
  Administered 2015-06-01 (×4): 10 meq via INTRAVENOUS
  Filled 2015-06-01: qty 100

## 2015-06-01 MED ORDER — POLYETHYLENE GLYCOL 3350 17 G PO PACK
17.0000 g | PACK | ORAL | Status: DC
  Administered 2015-06-01 – 2015-06-02 (×6): 17 g via ORAL
  Filled 2015-06-01 (×5): qty 1

## 2015-06-01 MED ORDER — LORAZEPAM 2 MG/ML IJ SOLN
0.5000 mg | Freq: Four times a day (QID) | INTRAMUSCULAR | Status: DC | PRN
  Administered 2015-06-02 – 2015-06-05 (×2): 0.5 mg via INTRAVENOUS
  Filled 2015-06-01 (×2): qty 1

## 2015-06-01 NOTE — Progress Notes (Signed)
Prn zofran administered for c/o nausea 

## 2015-06-01 NOTE — Progress Notes (Signed)
[facility] Progress Note  Subjective: Patient slept well last night, had some shortness of breath felt to be a panic attack. Responded to Ativan. No stool since yesterday.  Objective: Vital signs in last 24 hours: Temp:  [98.3 F (36.8 C)-100.4 F (38 C)] 100.3 F (37.9 C) (11/18 0620) Pulse Rate:  [65-75] 75 (11/17 2130) Resp:  [18-19] 19 (11/17 2130) BP: (161-185)/(60-72) 166/69 mmHg (11/18 0620) SpO2:  [94 %-99 %] 98 % (11/18 0620) Weight change:    PE: Unchanged  Lab Results: Results for orders placed or performed during the hospital encounter of 05/27/15 (from the past 24 hour(s))  Glucose, capillary     Status: Abnormal   Collection Time: 05/31/15  8:03 AM  Result Value Ref Range   Glucose-Capillary 152 (H) 65 - 99 mg/dL   Comment 1 Notify RN   Glucose, capillary     Status: Abnormal   Collection Time: 05/31/15 11:51 AM  Result Value Ref Range   Glucose-Capillary 174 (H) 65 - 99 mg/dL   Comment 1 Notify RN   Glucose, capillary     Status: Abnormal   Collection Time: 05/31/15  5:11 PM  Result Value Ref Range   Glucose-Capillary 155 (H) 65 - 99 mg/dL   Comment 1 Notify RN   Glucose, capillary     Status: Abnormal   Collection Time: 05/31/15  8:04 PM  Result Value Ref Range   Glucose-Capillary 149 (H) 65 - 99 mg/dL   Comment 1 Notify RN    Comment 2 Document in Chart   Glucose, capillary     Status: Abnormal   Collection Time: 05/31/15 11:44 PM  Result Value Ref Range   Glucose-Capillary 153 (H) 65 - 99 mg/dL   Comment 1 Notify RN    Comment 2 Document in Chart   Glucose, capillary     Status: Abnormal   Collection Time: 06/01/15  3:54 AM  Result Value Ref Range   Glucose-Capillary 148 (H) 65 - 99 mg/dL   Comment 1 Notify RN    Comment 2 Document in Chart   CBC     Status: Abnormal   Collection Time: 06/01/15  5:28 AM  Result Value Ref Range   WBC 7.1 4.0 - 10.5 K/uL   RBC 3.34 (L) 3.87 - 5.11 MIL/uL   Hemoglobin 9.2 (L) 12.0 - 15.0 g/dL   HCT 29.8 (L) 36.0 - 46.0 %   MCV 89.2 78.0 - 100.0 fL   MCH 27.5 26.0 - 34.0 pg   MCHC 30.9 30.0 - 36.0 g/dL   RDW 14.0 11.5 - 15.5 %   Platelets 281 150 - 400 K/uL  Basic metabolic panel     Status: Abnormal   Collection Time: 06/01/15  5:28 AM  Result Value Ref Range   Sodium 138 135 - 145 mmol/L   Potassium 2.9 (L) 3.5 - 5.1 mmol/L   Chloride 107 101 - 111 mmol/L   CO2 23 22 - 32 mmol/L   Glucose, Bld 140 (H) 65 - 99 mg/dL   BUN <5 (L) 6 - 20 mg/dL   Creatinine, Ser 0.81 0.44 - 1.00 mg/dL   Calcium 8.5 (L) 8.9 - 10.3 mg/dL   GFR calc non Af Amer >60 >60 mL/min   GFR calc Af Amer >60 >60 mL/min   Anion gap 8 5 - 15    Studies/Results: Dg Abd 1 View  05/31/2015  CLINICAL DATA:  Left lower quadrant pain, constipation EXAM: ABDOMEN - 1 VIEW COMPARISON:  Abdominal  radiographs dated 06/11/2015. CT abdomen pelvis dated 05/27/2015. FINDINGS: Enteric tube terminates in the distal gastric body. Nonobstructive bowel gas pattern. Residual contrast in the colon. IMPRESSION: Enteric tube terminates in the distal gastric body. Otherwise unremarkable abdominal radiograph. Electronically Signed   By: Julian Hy M.D.   On: 05/31/2015 13:22      Assessment: Diverticulitis Possible partial colonic obstruction related to diverticulitis  Plan: We'll resume Mira lax, continue NG suction. If doesn't improve soon, consider repeat CT scan.    Odies Desa C 06/01/2015, 7:39 AM  Pager [phone] If no answer or after 5 PM call [phone]

## 2015-06-01 NOTE — Progress Notes (Signed)
Pt has had no further complaints of shortness of breath. She has rested since receiving 1 mg Ativan IV at 2353.

## 2015-06-01 NOTE — Evaluation (Signed)
Physical Therapy Evaluation Patient Details Name: Sharon [name] MRN: [id number] DOB: [birth year] Today's Date: 06/01/2015   History of Present Illness  69 y.o. female admitted to [facility] on 05/27/15 for abdominal pain, N/V.  Pt dx with acute sigmoid diverticulitis.  NG tube was placed.  Pt with significant PMHx of DM, HTN, CHF, asthma, and anxiety.  Clinical Impression  Pt is weak, deconditioned and cannot tolerate much ambulating in the hallway at this time.  She did get up and sit in the recliner chair after our session, but requested nausea meds from RN as the walking made her start to dry heave.  I anticipate as the nausea clears and she is able to eat food again that her normal level of strength will return.  She agrees and we both anticipate that she will not need PT f/u at discharge, but we will follow her acutely.     Follow Up Recommendations No PT follow up    Equipment Recommendations  None recommended by PT    Recommendations for Other Services   NA    Precautions / Restrictions Precautions Precautions: Fall Precaution Comments: at this time she is mildly unsteady on her feet      Mobility  Bed Mobility Overal bed mobility: Modified Independent             General bed mobility comments: HOB elevated and pt using railing for leverage at trunk.   Transfers Overall transfer level: Needs assistance Equipment used: None Transfers: Sit to/from [facility] Sit to Stand: Min guard Stand pivot transfers: Min guard       General transfer comment: Min guard assist for safety due to poor balance, pt with heavy reliance on hands for support when transferring.   Ambulation/Gait Ambulation/Gait assistance: Min assist;Mod assist Ambulation Distance (Feet): 65 Feet Assistive device: 1 person hand held assist (and IV pole) Gait Pattern/deviations: Step-through pattern;Staggering right;Staggering left;Wide base of support Gait velocity: decreased Gait  velocity interpretation: <1.8 ft/sec, indicative of risk for recurrent falls General Gait Details: Pt with slow, staggering gait pattern.  She was holding IV and by end of the walk with fatigue and left hip pain, she was requiring up to mod assist to make it back to the room.  Next session, pt would be safer to use RW for hallway ambulation.       Balance Overall balance assessment: Needs assistance Sitting-balance support: Feet supported;No upper extremity supported Sitting balance-Leahy Scale: Good     Standing balance support: Single extremity supported;Bilateral upper extremity supported Standing balance-Leahy Scale: Poor Standing balance comment: pt needs external support at this time.                              Pertinent Vitals/Pain Pain Assessment: 0-10 Pain Score: 0-No pain    Home Living Family/patient expects to be discharged to:: Private residence Living Arrangements: Alone Available Help at Discharge: Family (sister in law) Type of Home: Apartment (handicap accessible) Home Access: Level entry     Home Layout: One level Home Equipment: Grab bars - tub/shower;Grab bars - toilet;Other (comment);Cane - quad ("there is a walker available, but I don't use it") Additional Comments: sister in law calls and stops by    Prior Function Level of Independence: Independent         Comments: uses cane when her left hip bursitis flares up.  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  Hand Dominance   Dominant Hand: Right    Extremity/Trunk Assessment   Upper Extremity Assessment: Generalized weakness           Lower Extremity Assessment: Generalized weakness (left leg with h/o hip bursitis)      Cervical / Trunk Assessment: Normal  Communication   Communication: No difficulties  Cognition Arousal/Alertness: Awake/alert Behavior During Therapy: WFL for tasks assessed/performed Overall Cognitive Status: Within Functional Limits for tasks assessed                                Assessment/Plan    PT Assessment Patient needs continued PT services  PT Diagnosis Difficulty walking;Abnormality of gait;Generalized weakness;Acute pain   PT Problem List Decreased strength;Decreased activity tolerance;Decreased balance;Decreased mobility;Pain;Obesity  PT Treatment Interventions DME instruction;Gait training;Stair training;Functional mobility training;Therapeutic activities;Therapeutic exercise;Balance training;Neuromuscular re-education;Patient/family education   PT Goals (Current goals can be found in the Care Plan section) Acute Rehab PT Goals Patient Stated Goal: to decrease nausea PT Goal Formulation: With patient Time For Goal Achievement: 06/15/15 Potential to Achieve Goals: Good    Frequency Min 3X/week    End of Session Equipment Utilized During Treatment: Gait belt Activity Tolerance: Patient limited by fatigue;Patient limited by pain Patient left: in chair;with call bell/phone within reach Nurse Communication: Other (comment) (pt requesting nausea meds)         Time: [id number] PT Time Calculation (min) (ACUTE ONLY): 30 min   Charges:   PT Evaluation $Initial PT Evaluation Tier I: 1 Procedure PT Treatments $Gait Training: 8-22 mins        Yerachmiel Spinney B. Usher Hedberg, PT, DPT [phone]   06/01/2015, 5:16 PM

## 2015-06-01 NOTE — Progress Notes (Signed)
[facility] PROGRESS NOTE  Sharon [name] [id number] DOB: 11-Jan-1946 DOA: 05/27/2015  PCP: Osborne Casco, MD  Brief HPI: 69yo Caucasian female with past medical history of type 2 diabetes, hypertension, hyperlipidemia, sleep apnea, presented with nausea, vomiting, diarrhea and abdominal pain. She underwent a CT scan which revealed sigmoid diverticulitis. She was admitted for further management. Patient has not improved despite standard management. Repeat films suggested colonic obstruction. NG tube was placed. GI is following.  Past medical history:  Past Medical History  Diagnosis Date  . Diabetes mellitus   . Hypertension   . CHF (congestive heart failure) ([place])   . Asthma   . Osteoarthritis   . Anxiety   . GERD (gastroesophageal reflux disease)   . Kidney stones   . Hypercholesteremia   . Thrombophlebitis     Consultants: [facility]  Procedures: none  Antibiotics: On ciprofloxacin and Flagyl  Subjective: Patient feels slightly better this morning. Denies any nausea or vomiting. Passing flatus and had a bowel movement today. Pain is improved.   Objective: Vital Signs  Filed Vitals:   05/31/15 2300 05/31/15 2310 05/31/15 2345 06/01/15 0620  BP:   185/60 166/69  Pulse:      Temp:    100.3 F (37.9 C)  TempSrc:    Oral  Resp:      Height:      Weight:      SpO2: 95% 99%  98%    Intake/Output Summary (Last 24 hours) at 06/01/15 0933 Last data filed at 06/01/15 0839  Gross per 24 hour  Intake 10976.25 ml  Output    750 ml  Net 10226.25 ml   Filed Weights   05/29/15 0511 05/30/15 0447 05/31/15 0559  Weight: 123.424 kg (272 lb 1.6 oz) 123.968 kg (273 lb 4.8 oz) 129.321 kg (285 lb 1.6 oz)    General appearance: alert, cooperative, appears stated age and no distress Resp: clear to auscultation bilaterally Cardio: regular rate and rhythm, S1, S2 normal, no murmur, click, rub or gallop GI: abdomen soft. Less tender in the left  lower quadrant compared to yesterday. No rebound, rigidity or guarding. No masses or organomegaly. Bowel sounds are heard. Extremities: extremities normal, atraumatic, no cyanosis or edema Neurologic: no focal deficits  Lab Results:  Basic Metabolic Panel:  Recent Labs Lab 05/27/15 2026 05/28/15 0522 05/29/15 0452 05/30/15 0250 06/01/15 0528  NA 142 142 142 143 138  K 2.7* 3.1* 3.4* 3.7 2.9*  CL 107 108 109 109 107  CO2 27 27 27 26 23   GLUCOSE 117* 95 93 153* 140*  BUN 6 5* <5* 7 <5*  CREATININE 0.68 0.68 0.78 0.80 0.81  CALCIUM 9.0 8.7* 8.6* 8.8* 8.5*  MG  --  1.7  --   --   --    Liver Function Tests:  Recent Labs Lab 05/27/15 2026 05/28/15 0522  AST 14* 22  ALT 13* 15  ALKPHOS 77 81  BILITOT 0.6 0.6  PROT 6.5 5.6*  ALBUMIN 3.1* 2.8*    Recent Labs Lab 05/27/15 2026  LIPASE 20   CBC:  Recent Labs Lab 05/27/15 2026 05/28/15 0522 05/29/15 0452 05/30/15 0250 06/01/15 0528  WBC 10.9* 11.2* 7.6 9.9 7.1  NEUTROABS  --  7.5  --   --   --   HGB 10.5* 9.6* 9.2* 9.7* 9.2*  HCT 33.0* 30.6* 30.0* 31.4* 29.8*  MCV 87.5 87.9 88.8 89.5 89.2  PLT 322 302 286 338 281   CBG:  Recent Labs Lab  05/31/15 1711 05/31/15 2004 05/31/15 2344 06/01/15 0354 06/01/15 0754  GLUCAP 155* 149* 153* 148* 116*    Recent Results (from the past 240 hour(s))  C difficile quick scan w PCR reflex     Status: None   Collection Time: 05/28/15 10:35 AM  Result Value Ref Range Status   C Diff antigen NEGATIVE NEGATIVE Final   C Diff toxin NEGATIVE NEGATIVE Final   C Diff interpretation Negative for toxigenic C. difficile  Final      Studies/Results: Dg Abd 1 View  05/31/2015  CLINICAL DATA:  Left lower quadrant pain, constipation EXAM: ABDOMEN - 1 VIEW COMPARISON:  Abdominal radiographs dated 06/11/2015. CT abdomen pelvis dated 05/27/2015. FINDINGS: Enteric tube terminates in the distal gastric body. Nonobstructive bowel gas pattern. Residual contrast in the colon.  IMPRESSION: Enteric tube terminates in the distal gastric body. Otherwise unremarkable abdominal radiograph. Electronically Signed   By: Julian Hy M.D.   On: 05/31/2015 13:22    Medications:  Scheduled: . antiseptic oral rinse  7 mL Mouth Rinse q12n4p  . chlorhexidine  15 mL Mouth Rinse BID  . ciprofloxacin  400 mg Intravenous Q12H  . enoxaparin (LOVENOX) injection  60 mg Subcutaneous Q24H  . insulin aspart  0-9 Units Subcutaneous Q4H  . loratadine  10 mg Oral Daily  . metoprolol  2.5 mg Intravenous 4 times per day  . metronidazole  500 mg Intravenous Q8H  . polyethylene glycol  17 g Oral 6 times per day  . potassium chloride  10 mEq Intravenous Q1 Hr x 4   Continuous: . dextrose 5 % and 0.45 % NaCl with KCl 20 mEq/L     [id number] **OR** acetaminophen, diphenhydrAMINE, hydrALAZINE, HYDROmorphone (DILAUDID) injection, LORazepam, [DISCONTINUED] ondansetron **OR** ondansetron (ZOFRAN) IV, promethazine  Assessment/Plan:  Principal Problem:   Sigmoid diverticulitis Active Problems:   Hypertension   Diverticulitis   Hypokalemia   Diabetes mellitus type 2, controlled (HCC)   Chronic anemia   Diverticulitis of large intestine without perforation or abscess without bleeding    Acute Sigmoid diverticulitis CT abdomen showed persistent or recurrent diverticulitis in the sigmoid colon, slightly increased in the degree of inflammation from the prior examination. No abscess or perforation. Patient was placed on ciprofloxacin and Flagyl. Patient is slow to improve. Though does appear to be better this morning. Abdominal films suggested a distal colonic obstruction. Gastroenterology is following. NG tube was placed. Repeat abdominal films does not show any obstruction. C. difficile negative.  History of essential Hypertension Blood pressure is poorly controlled. This is due to an inability to take oral medications along with pain. Continue scheduled metoprolol intravenously.  Hydralazine intravenously as needed.   Hypokalemia Noted to be low again this morning. Will be repleted. Magnesium is normal.  Diabetes mellitus type 2, controlled Continue sliding scale insulin.   Chronic anemia Hemoglobin is stable   History of chronic diastolic CHF, grade 2 diastolic dysfunction 2-D echo in 04/2013 had shown EF of 55-60% with grade 2 diastolic dysfunction, follow I's and O's closely. Currently well compensated. Caution with aggressive hydration.  History of Anxiety Patient had an episode of panic attack last night. Denies any shortness of breath this morning. Lungs are clear. Resume Paxil when she is able to take orally. Ativan as needed for now.  DVT Prophylaxis: Lovenox    Code Status: full code  Family Communication: discussed with the patient  Disposition Plan: Patient seems to be improving very slowly. Start mobilizing.    LOS: 4 days  [facility]  [facility] Pager [phone] 06/01/2015, 9:33 AM  If 7PM-7AM, please contact night-coverage at [email or web address], password [facility]

## 2015-06-02 ENCOUNTER — Inpatient Hospital Stay (HOSPITAL_COMMUNITY): Payer: BLUE CROSS/BLUE SHIELD

## 2015-06-02 LAB — BASIC METABOLIC PANEL
Anion gap: 9 (ref 5–15)
CHLORIDE: 110 mmol/L (ref 101–111)
CO2: 20 mmol/L — AB (ref 22–32)
CREATININE: 0.78 mg/dL (ref 0.44–1.00)
Calcium: 8.4 mg/dL — ABNORMAL LOW (ref 8.9–10.3)
GFR calc Af Amer: >60 mL/min (ref >60)
GFR calc non Af Amer: >60 mL/min (ref >60)
GLUCOSE: 142 mg/dL — AB (ref 65–99)
POTASSIUM: 3.7 mmol/L (ref 3.5–5.1)
SODIUM: 139 mmol/L (ref 135–145)

## 2015-06-02 LAB — CBC
HEMATOCRIT: 32.7 % — AB (ref 36.0–46.0)
HEMOGLOBIN: 10.5 g/dL — AB (ref 12.0–15.0)
MCH: 28.2 pg (ref 26.0–34.0)
MCHC: 32.1 g/dL (ref 30.0–36.0)
MCV: 87.7 fL (ref 78.0–100.0)
Platelets: 239 10*3/uL (ref 150–400)
RBC: 3.73 MIL/uL — AB (ref 3.87–5.11)
RDW: 14.2 % (ref 11.5–15.5)
WBC: 7.6 10*3/uL (ref 4.0–10.5)

## 2015-06-02 LAB — GLUCOSE, CAPILLARY
GLUCOSE-CAPILLARY: 131 mg/dL — AB (ref 65–99)
GLUCOSE-CAPILLARY: 150 mg/dL — AB (ref 65–99)
GLUCOSE-CAPILLARY: 156 mg/dL — AB (ref 65–99)
GLUCOSE-CAPILLARY: 135 mg/dL — AB (ref 65–99)
Glucose-Capillary: 130 mg/dL — ABNORMAL HIGH (ref 65–99)
Glucose-Capillary: 134 mg/dL — ABNORMAL HIGH (ref 65–99)

## 2015-06-02 LAB — MAGNESIUM: MAGNESIUM: 1.6 mg/dL — AB (ref 1.7–2.4)

## 2015-06-02 MED ORDER — IOHEXOL 300 MG/ML  SOLN
25.0000 mL | INTRAMUSCULAR | Status: AC
  Administered 2015-06-02 (×2): 25 mL via ORAL

## 2015-06-02 MED ORDER — IOHEXOL 300 MG/ML  SOLN
100.0000 mL | Freq: Once | INTRAMUSCULAR | Status: AC | PRN
  Administered 2015-06-02: 100 mL via INTRAVENOUS

## 2015-06-02 MED ORDER — MAGNESIUM SULFATE IN D5W 10-5 MG/ML-% IV SOLN
1.0000 g | Freq: Once | INTRAVENOUS | Status: AC
  Administered 2015-06-02: 1 g via INTRAVENOUS
  Filled 2015-06-02: qty 100

## 2015-06-02 NOTE — Progress Notes (Signed)
[facility] PROGRESS NOTE  Sharon [name] [id number] DOB: 1945/09/11 DOA: 05/27/2015  PCP: Osborne Casco, MD  Brief HPI: 69yo Caucasian female with past medical history of type 2 diabetes, hypertension, hyperlipidemia, sleep apnea, presented with nausea, vomiting, diarrhea and abdominal pain. She underwent a CT scan which revealed sigmoid diverticulitis. She was admitted for further management. Patient has not improved despite standard management. Repeat films suggested colonic obstruction. NG tube was placed. GI is following.  Past medical history:  Past Medical History  Diagnosis Date  . Diabetes mellitus   . Hypertension   . CHF (congestive heart failure) ([place])   . Asthma   . Osteoarthritis   . Anxiety   . GERD (gastroesophageal reflux disease)   . Kidney stones   . Hypercholesteremia   . Thrombophlebitis     Consultants: [facility]  Procedures: none  Antibiotics: On ciprofloxacin and Flagyl  Subjective: Patient feels worse this morning. Had fever overnight. Some nausea but no vomiting. Had a bowel movement this morning as well, which was loose. Denies any abdominal pain.   Objective: Vital Signs  Filed Vitals:   06/02/15 0026 06/02/15 0400 06/02/15 0445 06/02/15 0610  BP: 147/60  176/70 183/68  Pulse:   73 80  Temp:  99.8 F (37.7 C) 99.2 F (37.3 C)   TempSrc:  Oral Oral   Resp:   18   Height:      Weight:   127.869 kg (281 lb 14.4 oz)   SpO2:   98% 97%    Intake/Output Summary (Last 24 hours) at 06/02/15 1104 Last data filed at 06/02/15 1032  Gross per 24 hour  Intake 2781.25 ml  Output   1500 ml  Net 1281.25 ml   Filed Weights   05/30/15 0447 05/31/15 0559 06/02/15 0445  Weight: 123.968 kg (273 lb 4.8 oz) 129.321 kg (285 lb 1.6 oz) 127.869 kg (281 lb 14.4 oz)    General appearance: alert, cooperative, appears stated age and no distress Resp: clear to auscultation bilaterally Cardio: regular rate and rhythm, S1,  S2 normal, no murmur, click, rub or gallop GI: Abdomen remains soft. Tenderness appreciated in the left lower quadrant. Similar to yesterday. No rebound, rigidity or guarding. No masses or organomegaly. Bowel sounds are present, normal.  Extremities: extremities normal, atraumatic, no cyanosis or edema Neurologic: no focal deficits  Lab Results:  Basic Metabolic Panel:  Recent Labs Lab 05/28/15 0522 05/29/15 0452 05/30/15 0250 06/01/15 0528 06/02/15 0412  NA 142 142 143 138 139  K 3.1* 3.4* 3.7 2.9* 3.7  CL 108 109 109 107 110  CO2 27 27 26 23  20*  GLUCOSE 95 93 153* 140* 142*  BUN 5* <5* 7 <5* <5*  CREATININE 0.68 0.78 0.80 0.81 0.78  CALCIUM 8.7* 8.6* 8.8* 8.5* 8.4*  MG 1.7  --   --  1.7 1.6*   Liver Function Tests:  Recent Labs Lab 05/27/15 2026 05/28/15 0522  AST 14* 22  ALT 13* 15  ALKPHOS 77 81  BILITOT 0.6 0.6  PROT 6.5 5.6*  ALBUMIN 3.1* 2.8*    Recent Labs Lab 05/27/15 2026  LIPASE 20   CBC:  Recent Labs Lab 05/28/15 0522 05/29/15 0452 05/30/15 0250 06/01/15 0528 06/02/15 0412  WBC 11.2* 7.6 9.9 7.1 7.6  NEUTROABS 7.5  --   --   --   --   HGB 9.6* 9.2* 9.7* 9.2* 10.5*  HCT 30.6* 30.0* 31.4* 29.8* 32.7*  MCV 87.9 88.8 89.5 89.2 87.7  PLT  302 286 338 281 239   CBG:  Recent Labs Lab 06/01/15 1554 06/01/15 1934 06/02/15 0019 06/02/15 0346 06/02/15 0750  GLUCAP 119* 141* 134* 131* 150*    Recent Results (from the past 240 hour(s))  C difficile quick scan w PCR reflex     Status: None   Collection Time: 05/28/15 10:35 AM  Result Value Ref Range Status   C Diff antigen NEGATIVE NEGATIVE Final   C Diff toxin NEGATIVE NEGATIVE Final   C Diff interpretation Negative for toxigenic C. difficile  Final      Studies/Results: Dg Abd 1 View  05/31/2015  CLINICAL DATA:  Left lower quadrant pain, constipation EXAM: ABDOMEN - 1 VIEW COMPARISON:  Abdominal radiographs dated 06/11/2015. CT abdomen pelvis dated 05/27/2015. FINDINGS: Enteric  tube terminates in the distal gastric body. Nonobstructive bowel gas pattern. Residual contrast in the colon. IMPRESSION: Enteric tube terminates in the distal gastric body. Otherwise unremarkable abdominal radiograph. Electronically Signed   By: Julian Hy M.D.   On: 05/31/2015 13:22    Medications:  Scheduled: . antiseptic oral rinse  7 mL Mouth Rinse q12n4p  . chlorhexidine  15 mL Mouth Rinse BID  . ciprofloxacin  400 mg Intravenous Q12H  . enoxaparin (LOVENOX) injection  60 mg Subcutaneous Q24H  . insulin aspart  0-9 Units Subcutaneous Q4H  . iohexol  25 mL Oral Q1 Hr x 2  . loratadine  10 mg Oral Daily  . metoprolol  2.5 mg Intravenous 4 times per day  . metronidazole  500 mg Intravenous Q8H   Continuous: . dextrose 5 % and 0.45 % NaCl with KCl 20 mEq/L 75 mL/hr at 06/02/15 [id number]   [id number] **OR** acetaminophen, diphenhydrAMINE, hydrALAZINE, HYDROmorphone (DILAUDID) injection, LORazepam, [DISCONTINUED] ondansetron **OR** ondansetron (ZOFRAN) IV, promethazine  Assessment/Plan:  Principal Problem:   Sigmoid diverticulitis Active Problems:   Hypertension   Diverticulitis   Hypokalemia   Diabetes mellitus type 2, controlled (HCC)   Chronic anemia   Diverticulitis of large intestine without perforation or abscess without bleeding    Acute Sigmoid diverticulitis Patient has been very slow to respond to treatment. Overnight, her temperature was 101.64F. Seen by gastroenterology this morning. They have ordered a repeat CT scan of the abdomen and pelvis. We agree with this plan. Initial CT abdomen showed persistent or recurrent diverticulitis in the sigmoid colon, slightly increased in the degree of inflammation from the prior examination. No abscess or perforation. Continue ciprofloxacin and Flagyl for now. Abdominal films suggested a distal colonic obstruction. Gastroenterology is following. NG tube was placed. Repeat abdominal films does not show any obstruction. C.  difficile negative.  History of essential Hypertension Blood pressure is poorly controlled. This is due to an inability to take oral medications along with pain. Continue scheduled metoprolol intravenously. Hydralazine intravenously as needed.   Hypokalemia/hypomagnesemia Magnesium is noted to be low this morning. Potassium is normal. Will give intravenous magnesium sulfate.   Diabetes mellitus type 2, controlled Continue sliding scale insulin. CBGs are reasonably well controlled.  Chronic anemia Hemoglobin is stable   History of chronic diastolic CHF, grade 2 diastolic dysfunction 2-D echo in 04/2013 had shown EF of 55-60% with grade 2 diastolic dysfunction, follow I's and O's closely. Currently well compensated. Caution with aggressive hydration.  History of Anxiety Patient had an episode of panic attack the other night. Resume Paxil when she is able to take orally. Ativan as needed for now.  DVT Prophylaxis: Lovenox    Code Status: full code  Family  Communication: discussed with the patient  Disposition Plan: Patient very slow to improve. Mobilize as tolerated. Await repeat CT of the abdomen and pelvis.    LOS: 5 days   [facility] Pager [phone] 06/02/2015, 11:04 AM  If 7PM-7AM, please contact night-coverage at [email or web address], password [facility]

## 2015-06-02 NOTE — Progress Notes (Signed)
Order placed for iv consult as new iv needed for ct scan. Waiting on iv team

## 2015-06-02 NOTE — Progress Notes (Signed)
[facility] Progress Note  Subjective: Still feeling poorly with significant amount of nausea, abdominal pain is worse. He had a temperature to 101.3 last night  Objective: Vital signs in last 24 hours: Temp:  [99.2 F (37.3 C)-101.3 F (38.5 C)] 99.2 F (37.3 C) (11/19 0445) Pulse Rate:  [65-80] 80 (11/19 0610) Resp:  [18] 18 (11/19 0445) BP: (134-183)/(60-71) 183/68 mmHg (11/19 0610) SpO2:  [97 %-100 %] 97 % (11/19 0610) Weight:  [127.869 kg (281 lb 14.4 oz)] 127.869 kg (281 lb 14.4 oz) (11/19 0445) Weight change:    [id number] distended soft nontender decreased bowel sounds  Lab Results: Results for orders placed or performed during the hospital encounter of 05/27/15 (from the past 24 hour(s))  Glucose, capillary     Status: Abnormal   Collection Time: 06/01/15  7:54 AM  Result Value Ref Range   Glucose-Capillary 116 (H) 65 - 99 mg/dL  Glucose, capillary     Status: Abnormal   Collection Time: 06/01/15 12:19 PM  Result Value Ref Range   Glucose-Capillary 138 (H) 65 - 99 mg/dL  Glucose, capillary     Status: Abnormal   Collection Time: 06/01/15  3:54 PM  Result Value Ref Range   Glucose-Capillary 119 (H) 65 - 99 mg/dL   Comment 1 Notify RN   Glucose, capillary     Status: Abnormal   Collection Time: 06/01/15  7:34 PM  Result Value Ref Range   Glucose-Capillary 141 (H) 65 - 99 mg/dL   Comment 1 Notify RN   Glucose, capillary     Status: Abnormal   Collection Time: 06/02/15 12:19 AM  Result Value Ref Range   Glucose-Capillary 134 (H) 65 - 99 mg/dL   Comment 1 Notify RN   Glucose, capillary     Status: Abnormal   Collection Time: 06/02/15  3:46 AM  Result Value Ref Range   Glucose-Capillary 131 (H) 65 - 99 mg/dL   Comment 1 Notify RN   CBC     Status: Abnormal   Collection Time: 06/02/15  4:12 AM  Result Value Ref Range   WBC 7.6 4.0 - 10.5 K/uL   RBC 3.73 (L) 3.87 - 5.11 MIL/uL   Hemoglobin 10.5 (L) 12.0 - 15.0 g/dL   HCT 32.7 (L) 36.0 - 46.0 %   MCV 87.7 78.0 - 100.0 fL   MCH 28.2 26.0 - 34.0 pg   MCHC 32.1 30.0 - 36.0 g/dL   RDW 14.2 11.5 - 15.5 %   Platelets 239 150 - 400 K/uL  Basic metabolic panel     Status: Abnormal   Collection Time: 06/02/15  4:12 AM  Result Value Ref Range   Sodium 139 135 - 145 mmol/L   Potassium 3.7 3.5 - 5.1 mmol/L   Chloride 110 101 - 111 mmol/L   CO2 20 (L) 22 - 32 mmol/L   Glucose, Bld 142 (H) 65 - 99 mg/dL   BUN <5 (L) 6 - 20 mg/dL   Creatinine, Ser 0.78 0.44 - 1.00 mg/dL   Calcium 8.4 (L) 8.9 - 10.3 mg/dL   GFR calc non Af Amer >60 >60 mL/min   GFR calc Af Amer >60 >60 mL/min   Anion gap 9 5 - 15  Magnesium     Status: Abnormal   Collection Time: 06/02/15  4:12 AM  Result Value Ref Range   Magnesium 1.6 (L) 1.7 - 2.4 mg/dL    Studies/Results: Dg Abd 1 View  05/31/2015  CLINICAL DATA:  Left lower quadrant  pain, constipation EXAM: ABDOMEN - 1 VIEW COMPARISON:  Abdominal radiographs dated 06/11/2015. CT abdomen pelvis dated 05/27/2015. FINDINGS: Enteric tube terminates in the distal gastric body. Nonobstructive bowel gas pattern. Residual contrast in the colon. IMPRESSION: Enteric tube terminates in the distal gastric body. Otherwise unremarkable abdominal radiograph. Electronically Signed   By: Julian Hy M.D.   On: 05/31/2015 13:22      Assessment: Diverticulitis with persistent nausea vomiting possible ileus possible small bowel obstruction with recent temperature spike.  Plan: Continue current antibiotics Hold Mira lax Repeat abdominopelvic CT scan today.    Braylin Formby C 06/02/2015, 7:05 AM  Pager [phone] If no answer or after 5 PM call [phone]

## 2015-06-02 NOTE — Progress Notes (Signed)
Pt prepped with omnipaque for ct of abdomen this pm

## 2015-06-02 NOTE — Progress Notes (Signed)
Paged iv team to check on status of request for new iv site.

## 2015-06-02 NOTE — Progress Notes (Signed)
Bladder scan patient since she had not voided since my shift and patient did not complain of bladder fullness.  Bladder scan volume showed 579mL. Paged Floor coverage MD for an In and Out Cath.  In and Out cath produced 775 mL, clear, tea colored and malodorous urine.  Patient tolerated well the procedure.

## 2015-06-03 LAB — GLUCOSE, CAPILLARY
GLUCOSE-CAPILLARY: 142 mg/dL — AB (ref 65–99)
GLUCOSE-CAPILLARY: 136 mg/dL — AB (ref 65–99)
Glucose-Capillary: 118 mg/dL — ABNORMAL HIGH (ref 65–99)
Glucose-Capillary: 135 mg/dL — ABNORMAL HIGH (ref 65–99)
Glucose-Capillary: 138 mg/dL — ABNORMAL HIGH (ref 65–99)
Glucose-Capillary: 144 mg/dL — ABNORMAL HIGH (ref 65–99)
Glucose-Capillary: 154 mg/dL — ABNORMAL HIGH (ref 65–99)

## 2015-06-03 LAB — COMPREHENSIVE METABOLIC PANEL
ALT: 12 U/L — ABNORMAL LOW (ref 14–54)
ANION GAP: 6 (ref 5–15)
AST: 12 U/L — ABNORMAL LOW (ref 15–41)
Albumin: 2.4 g/dL — ABNORMAL LOW (ref 3.5–5.0)
Alkaline Phosphatase: 72 U/L (ref 38–126)
BILIRUBIN TOTAL: 0.4 mg/dL (ref 0.3–1.2)
CALCIUM: 8.2 mg/dL — AB (ref 8.9–10.3)
CO2: 22 mmol/L (ref 22–32)
Chloride: 108 mmol/L (ref 101–111)
Creatinine, Ser: 0.74 mg/dL (ref 0.44–1.00)
GFR calc Af Amer: >60 mL/min (ref >60)
Glucose, Bld: 166 mg/dL — ABNORMAL HIGH (ref 65–99)
POTASSIUM: 3.2 mmol/L — AB (ref 3.5–5.1)
Sodium: 136 mmol/L (ref 135–145)
TOTAL PROTEIN: 5.3 g/dL — AB (ref 6.5–8.1)

## 2015-06-03 LAB — CBC
HEMATOCRIT: 30.4 % — AB (ref 36.0–46.0)
Hemoglobin: 9.5 g/dL — ABNORMAL LOW (ref 12.0–15.0)
MCH: 27.5 pg (ref 26.0–34.0)
MCHC: 31.3 g/dL (ref 30.0–36.0)
MCV: 88.1 fL (ref 78.0–100.0)
Platelets: 268 10*3/uL (ref 150–400)
RBC: 3.45 MIL/uL — ABNORMAL LOW (ref 3.87–5.11)
RDW: 14.3 % (ref 11.5–15.5)
WBC: 7.9 10*3/uL (ref 4.0–10.5)

## 2015-06-03 MED ORDER — PHENOL 1.4 % MT LIQD
1.0000 | OROMUCOSAL | Status: DC | PRN
  Administered 2015-06-03: 1 via OROMUCOSAL
  Filled 2015-06-03: qty 177

## 2015-06-03 MED ORDER — POTASSIUM CHLORIDE 10 MEQ/100ML IV SOLN
10.0000 meq | INTRAVENOUS | Status: AC
  Administered 2015-06-03 (×4): 10 meq via INTRAVENOUS
  Filled 2015-06-03 (×4): qty 100

## 2015-06-03 NOTE — Progress Notes (Signed)
Pt refused dinner saying that she is not hungry. Requested antiemetic medicine. 4mg  zofran administered. Pt also asked to be connected back to suction. Pt now connected to regular low suction

## 2015-06-03 NOTE — Progress Notes (Signed)
[facility] PROGRESS NOTE  Sharon [name] [id number] DOB: 05-Feb-1946 DOA: 05/27/2015  PCP: Osborne Casco, MD  Brief HPI: 69yo Caucasian female with past medical history of type 2 diabetes, hypertension, hyperlipidemia, sleep apnea, presented with nausea, vomiting, diarrhea and abdominal pain. She underwent a CT scan which revealed sigmoid diverticulitis. She was admitted for further management. Patient was very slow to improve despite standard management. Repeat films suggested colonic obstruction. NG tube was placed. GI is following.  Past medical history:  Past Medical History  Diagnosis Date  . Diabetes mellitus   . Hypertension   . CHF (congestive heart failure) ([place])   . Asthma   . Osteoarthritis   . Anxiety   . GERD (gastroesophageal reflux disease)   . Kidney stones   . Hypercholesteremia   . Thrombophlebitis     Consultants: [facility]  Procedures: none  Antibiotics: On ciprofloxacin and Flagyl  Subjective: Patient feels much better this morning. Abdomen not as distended as before. Pain is much improved. Denies any nausea today. Continues to pass gas from below. Having bowel movements as well.  Objective: Vital Signs  Filed Vitals:   06/02/15 2053 06/02/15 2338 06/03/15 0440 06/03/15 0550  BP: 183/72  177/69 167/72  Pulse:   18 75  Temp:  97.9 F (36.6 C) 98 F (36.7 C)   TempSrc:  Oral Oral   Resp:   19   Height:      Weight:      SpO2:   93%     Intake/Output Summary (Last 24 hours) at 06/03/15 1047 Last data filed at 06/03/15 0758  Gross per 24 hour  Intake      0 ml  Output    500 ml  Net   -500 ml   Filed Weights   05/30/15 0447 05/31/15 0559 06/02/15 0445  Weight: 123.968 kg (273 lb 4.8 oz) 129.321 kg (285 lb 1.6 oz) 127.869 kg (281 lb 14.4 oz)    General appearance: alert, cooperative, appears stated age and no distress Resp: clear to auscultation bilaterally Cardio: regular rate and rhythm, S1, S2  normal, no murmur, click, rub or gallop GI: Abdomen soft. Nontender today. No rebound, rigidity or guarding. No masses or organomegaly. Bowel sounds are present.  Extremities: extremities normal, atraumatic, no cyanosis or edema Neurologic: no focal deficits  Lab Results:  Basic Metabolic Panel:  Recent Labs Lab 05/28/15 0522 05/29/15 0452 05/30/15 0250 06/01/15 0528 06/02/15 0412 06/03/15 0458  NA 142 142 143 138 139 136  K 3.1* 3.4* 3.7 2.9* 3.7 3.2*  CL 108 109 109 107 110 108  CO2 27 27 26 23  20* 22  GLUCOSE 95 93 153* 140* 142* 166*  BUN 5* <5* 7 <5* <5* <5*  CREATININE 0.68 0.78 0.80 0.81 0.78 0.74  CALCIUM 8.7* 8.6* 8.8* 8.5* 8.4* 8.2*  MG 1.7  --   --  1.7 1.6*  --    Liver Function Tests:  Recent Labs Lab 05/27/15 2026 05/28/15 0522 06/03/15 0458  AST 14* 22 12*  ALT 13* 15 12*  ALKPHOS 77 81 72  BILITOT 0.6 0.6 0.4  PROT 6.5 5.6* 5.3*  ALBUMIN 3.1* 2.8* 2.4*    Recent Labs Lab 05/27/15 2026  LIPASE 20   CBC:  Recent Labs Lab 05/28/15 0522 05/29/15 0452 05/30/15 0250 06/01/15 0528 06/02/15 0412 06/03/15 0458  WBC 11.2* 7.6 9.9 7.1 7.6 7.9  NEUTROABS 7.5  --   --   --   --   --  HGB 9.6* 9.2* 9.7* 9.2* 10.5* 9.5*  HCT 30.6* 30.0* 31.4* 29.8* 32.7* 30.4*  MCV 87.9 88.8 89.5 89.2 87.7 88.1  PLT 302 286 338 281 239 268   CBG:  Recent Labs Lab 06/02/15 1620 06/02/15 2033 06/02/15 2342 06/03/15 0439 06/03/15 0751  GLUCAP 135* 130* 138* 154* 142*    Recent Results (from the past 240 hour(s))  C difficile quick scan w PCR reflex     Status: None   Collection Time: 05/28/15 10:35 AM  Result Value Ref Range Status   C Diff antigen NEGATIVE NEGATIVE Final   C Diff toxin NEGATIVE NEGATIVE Final   C Diff interpretation Negative for toxigenic C. difficile  Final      Studies/Results: Ct Abdomen Pelvis W Contrast  06/02/2015  CLINICAL DATA:  Diverticulitis with abdominal pain. EXAM: CT ABDOMEN AND PELVIS WITH CONTRAST TECHNIQUE:  Multidetector CT imaging of the abdomen and pelvis was performed using the standard protocol following bolus administration of intravenous contrast. CONTRAST:  137mL OMNIPAQUE IOHEXOL 300 MG/ML  SOLN COMPARISON:  05/27/2015 FINDINGS: Lower chest: Motion degradation at the bases. Grossly clear. Mild cardiomegaly. Small bilateral pleural effusions are new. Hepatobiliary: Normal liver. Cholecystectomy. Intrahepatic ducts upper normal. Mild common duct dilatation at 13 mm. Pancreas: Mild pancreatic atrophy. Suspect ductectasia within the pancreatic uncinate process (image 34, series 2). An underlying cystic lesion of 1.5 cm is possible. This has been present over prior exams. No main duct dilatation. Spleen: Normal in size, without focal abnormality. Adrenals/Urinary Tract: Right adrenal nodule is unchanged in size and was consistent with an adenoma on the initial exam. Low normal left adrenal. Too small to characterize lesions in the left kidney. A lower pole right renal lesion is likely a cyst. No hydronephrosis. Normal urinary bladder. Stomach/Bowel: Nasogastric tube terminating at the body of the stomach. Wall thickening involving the sigmoid with mild surrounding edema. The severity is similar, but the length of colonic inflammation is slightly increased, involving the majority of the sigmoid today. No extraluminal free gas or pericolonic abscess. Fluid throughout the colon suggests a diarrheal state. Normal terminal ileum. Normal small bowel. Vascular/Lymphatic: Normal caliber of the aorta and branch vessels. No abdominopelvic adenopathy. Reproductive: Hysterectomy.  No adnexal mass. Other: Anasarca. Musculoskeletal: Degenerative disc disease at L4-5 with prominent disc bulge. IMPRESSION: 1. Uncomplicated persistent or recurrent diverticulitis. This extends over a longer segment of the sigmoid today, without evidence of free perforation or complicating abscess. 2. Cholecystectomy with mild biliary duct dilatation.  Correlate with bilirubin levels. If these are normal, this is likely within normal variation. 3. Pancreatic atrophy. Suggestion of side branch duct ectasia versus a cystic lesion in the pancreatic uncinate process. Consider follow-up with MRI/ MRCP at 1 year for further characterization and to confirm stability. 4. Right adrenal adenoma. Electronically Signed   By: Abigail Miyamoto M.D.   On: 06/02/2015 18:14    Medications:  Scheduled: . antiseptic oral rinse  7 mL Mouth Rinse q12n4p  . chlorhexidine  15 mL Mouth Rinse BID  . ciprofloxacin  400 mg Intravenous Q12H  . enoxaparin (LOVENOX) injection  60 mg Subcutaneous Q24H  . insulin aspart  0-9 Units Subcutaneous Q4H  . loratadine  10 mg Oral Daily  . metoprolol  2.5 mg Intravenous 4 times per day  . metronidazole  500 mg Intravenous Q8H  . potassium chloride  10 mEq Intravenous Q1 Hr x 4   Continuous: . dextrose 5 % and 0.45 % NaCl with KCl 20 mEq/L 75 mL/hr  at 06/02/15 1815   [id number] **OR** acetaminophen, diphenhydrAMINE, hydrALAZINE, HYDROmorphone (DILAUDID) injection, LORazepam, [DISCONTINUED] ondansetron **OR** ondansetron (ZOFRAN) IV, promethazine  Assessment/Plan:  Principal Problem:   Sigmoid diverticulitis Active Problems:   Hypertension   Diverticulitis   Hypokalemia   Diabetes mellitus type 2, controlled (HCC)   Chronic anemia   Diverticulitis of large intestine without perforation or abscess without bleeding    Acute Sigmoid diverticulitis Patient has surprisingly improved significantly overnight. Her repeat CT scan did not show any concerning findings including abscess or obstruction or perforation. Discussed with Dr. Amedeo Plenty with gastroenterology. Her NG tube will be clamped today. She will be given a clear liquid diet. No further episodes of fever. Continue ciprofloxacin and Flagyl intravenously for now. Initial CT abdomen showed persistent or recurrent diverticulitis in the sigmoid colon, slightly increased in  the degree of inflammation from the prior examination. No abscess or perforation. Abdominal films suggested a distal colonic obstruction. C. difficile negative.  History of essential Hypertension Blood pressure is poorly controlled. This is due to an inability to take oral medications along with pain. Continue scheduled metoprolol intravenously. Hydralazine intravenously as needed. If she tolerates her diet we will resume her antihypertensive agents.  Hypokalemia/hypomagnesemia Replete potassium. Check magnesium level. Levels are low, likely due to GI loss. Monitor closely.   Diabetes mellitus type 2, controlled Continue sliding scale insulin. CBGs are reasonably well controlled.  Chronic anemia Hemoglobin is stable   History of chronic diastolic CHF, grade 2 diastolic dysfunction 2-D echo in 04/2013 had shown EF of 55-60% with grade 2 diastolic dysfunction, follow I's and O's closely. Currently well compensated. Caution with aggressive hydration.  History of Anxiety Patient had an episode of panic attack during this hospitalization. No further episodes. Resume Paxil when she is able to take orally. Ativan as needed for now.  Mildly dilated biliary ducts noted on CT scan LFTs and bilirubin normal.  Pancreatic atrophy, along with possible cystic abnormality noted on CT scan Will need outpatient follow-up and repeat imaging studies in 1 year.  DVT Prophylaxis: Lovenox    Code Status: full code  Family Communication: discussed with the patient  Disposition Plan: Patient appears to be improved this morning. Started on clear liquids. Continue to monitor closely. Mobilize as tolerated.    LOS: 6 days   [facility] Pager [phone] 06/03/2015, 10:47 AM  If 7PM-7AM, please contact night-coverage at [email or web address], password [facility]

## 2015-06-03 NOTE — Progress Notes (Signed)
[facility] Progress Note  Subjective: Sleeping well, states pain is much better starting to have some diarrhea, nausea improved  Objective: Vital signs in last 24 hours: Temp:  [97.9 F (36.6 C)-99.7 F (37.6 C)] 98 F (36.7 C) (11/20 0440) Pulse Rate:  [18-75] 75 (11/20 0550) Resp:  [18-22] 19 (11/20 0440) BP: (167-185)/(69-72) 167/72 mmHg (11/20 0550) SpO2:  [93 %-96 %] 93 % (11/20 0440) Weight change:    PE: Unchanged  Lab Results: Results for orders placed or performed during the hospital encounter of 05/27/15 (from the past 24 hour(s))  Glucose, capillary     Status: Abnormal   Collection Time: 06/02/15 12:13 PM  Result Value Ref Range   Glucose-Capillary 156 (H) 65 - 99 mg/dL  Glucose, capillary     Status: Abnormal   Collection Time: 06/02/15  4:20 PM  Result Value Ref Range   Glucose-Capillary 135 (H) 65 - 99 mg/dL  Glucose, capillary     Status: Abnormal   Collection Time: 06/02/15  8:33 PM  Result Value Ref Range   Glucose-Capillary 130 (H) 65 - 99 mg/dL  Glucose, capillary     Status: Abnormal   Collection Time: 06/02/15 11:42 PM  Result Value Ref Range   Glucose-Capillary 138 (H) 65 - 99 mg/dL  Glucose, capillary     Status: Abnormal   Collection Time: 06/03/15  4:39 AM  Result Value Ref Range   Glucose-Capillary 154 (H) 65 - 99 mg/dL  Comprehensive metabolic panel     Status: Abnormal   Collection Time: 06/03/15  4:58 AM  Result Value Ref Range   Sodium 136 135 - 145 mmol/L   Potassium 3.2 (L) 3.5 - 5.1 mmol/L   Chloride 108 101 - 111 mmol/L   CO2 22 22 - 32 mmol/L   Glucose, Bld 166 (H) 65 - 99 mg/dL   BUN <5 (L) 6 - 20 mg/dL   Creatinine, Ser 0.74 0.44 - 1.00 mg/dL   Calcium 8.2 (L) 8.9 - 10.3 mg/dL   Total Protein 5.3 (L) 6.5 - 8.1 g/dL   Albumin 2.4 (L) 3.5 - 5.0 g/dL   AST 12 (L) 15 - 41 U/L   ALT 12 (L) 14 - 54 U/L   Alkaline Phosphatase 72 38 - 126 U/L   Total Bilirubin 0.4 0.3 - 1.2 mg/dL   GFR calc non Af Amer >60 >60 mL/min    GFR calc Af Amer >60 >60 mL/min   Anion gap 6 5 - 15  CBC     Status: Abnormal   Collection Time: 06/03/15  4:58 AM  Result Value Ref Range   WBC 7.9 4.0 - 10.5 K/uL   RBC 3.45 (L) 3.87 - 5.11 MIL/uL   Hemoglobin 9.5 (L) 12.0 - 15.0 g/dL   HCT 30.4 (L) 36.0 - 46.0 %   MCV 88.1 78.0 - 100.0 fL   MCH 27.5 26.0 - 34.0 pg   MCHC 31.3 30.0 - 36.0 g/dL   RDW 14.3 11.5 - 15.5 %   Platelets 268 150 - 400 K/uL  Glucose, capillary     Status: Abnormal   Collection Time: 06/03/15  7:51 AM  Result Value Ref Range   Glucose-Capillary 142 (H) 65 - 99 mg/dL    Studies/Results: Ct Abdomen Pelvis W Contrast  06/02/2015  CLINICAL DATA:  Diverticulitis with abdominal pain. EXAM: CT ABDOMEN AND PELVIS WITH CONTRAST TECHNIQUE: Multidetector CT imaging of the abdomen and pelvis was performed using the standard protocol following bolus administration of intravenous  contrast. CONTRAST:  19mL OMNIPAQUE IOHEXOL 300 MG/ML  SOLN COMPARISON:  05/27/2015 FINDINGS: Lower chest: Motion degradation at the bases. Grossly clear. Mild cardiomegaly. Small bilateral pleural effusions are new. Hepatobiliary: Normal liver. Cholecystectomy. Intrahepatic ducts upper normal. Mild common duct dilatation at 13 mm. Pancreas: Mild pancreatic atrophy. Suspect ductectasia within the pancreatic uncinate process (image 34, series 2). An underlying cystic lesion of 1.5 cm is possible. This has been present over prior exams. No main duct dilatation. Spleen: Normal in size, without focal abnormality. Adrenals/Urinary Tract: Right adrenal nodule is unchanged in size and was consistent with an adenoma on the initial exam. Low normal left adrenal. Too small to characterize lesions in the left kidney. A lower pole right renal lesion is likely a cyst. No hydronephrosis. Normal urinary bladder. Stomach/Bowel: Nasogastric tube terminating at the body of the stomach. Wall thickening involving the sigmoid with mild surrounding edema. The severity is  similar, but the length of colonic inflammation is slightly increased, involving the majority of the sigmoid today. No extraluminal free gas or pericolonic abscess. Fluid throughout the colon suggests a diarrheal state. Normal terminal ileum. Normal small bowel. Vascular/Lymphatic: Normal caliber of the aorta and branch vessels. No abdominopelvic adenopathy. Reproductive: Hysterectomy.  No adnexal mass. Other: Anasarca. Musculoskeletal: Degenerative disc disease at L4-5 with prominent disc bulge. IMPRESSION: 1. Uncomplicated persistent or recurrent diverticulitis. This extends over a longer segment of the sigmoid today, without evidence of free perforation or complicating abscess. 2. Cholecystectomy with mild biliary duct dilatation. Correlate with bilirubin levels. If these are normal, this is likely within normal variation. 3. Pancreatic atrophy. Suggestion of side branch duct ectasia versus a cystic lesion in the pancreatic uncinate process. Consider follow-up with MRI/ MRCP at 1 year for further characterization and to confirm stability. 4. Right adrenal adenoma. Electronically Signed   By: Abigail Miyamoto M.D.   On: 06/02/2015 18:14      Assessment: Diverticulitis serial CT scan shows slight lengthening of thickened segment but otherwise no worsening, no significant retained stool above the narrowed area  Plan: Will clamp NG tube today and try sips of clear liquids and then hopefully DC NG tube Continue Cipro and Flagyl.    Venola Castello C 06/03/2015, 9:16 AM  Pager [phone] If no answer or after 5 PM call [phone]

## 2015-06-03 NOTE — Progress Notes (Signed)
Pt up in chair this afternoon. Reported feeling much better today. Ate lunch while up in chair

## 2015-06-03 NOTE — Progress Notes (Signed)
Pt sat out to chair most of the afternoon

## 2015-06-03 NOTE — Progress Notes (Signed)
4 runs of iv potassium administered today for a potassium level of 3.2

## 2015-06-03 NOTE — Progress Notes (Signed)
Pt is c/o sore throat. Denies abdo pain at this time

## 2015-06-04 ENCOUNTER — Inpatient Hospital Stay (HOSPITAL_COMMUNITY): Payer: BLUE CROSS/BLUE SHIELD

## 2015-06-04 DIAGNOSIS — I5032 Chronic diastolic (congestive) heart failure: Secondary | ICD-10-CM | POA: Diagnosis not present

## 2015-06-04 DIAGNOSIS — I5033 Acute on chronic diastolic (congestive) heart failure: Secondary | ICD-10-CM

## 2015-06-04 LAB — BASIC METABOLIC PANEL
ANION GAP: 10 (ref 5–15)
CALCIUM: 8.3 mg/dL — AB (ref 8.9–10.3)
CO2: 19 mmol/L — AB (ref 22–32)
Chloride: 108 mmol/L (ref 101–111)
Creatinine, Ser: 0.74 mg/dL (ref 0.44–1.00)
GFR calc Af Amer: >60 mL/min (ref >60)
GFR calc non Af Amer: >60 mL/min (ref >60)
GLUCOSE: 170 mg/dL — AB (ref 65–99)
POTASSIUM: 3.6 mmol/L (ref 3.5–5.1)
Sodium: 137 mmol/L (ref 135–145)

## 2015-06-04 LAB — CBC
HEMATOCRIT: 32.5 % — AB (ref 36.0–46.0)
HEMOGLOBIN: 10.1 g/dL — AB (ref 12.0–15.0)
MCH: 27.4 pg (ref 26.0–34.0)
MCHC: 31.1 g/dL (ref 30.0–36.0)
MCV: 88.3 fL (ref 78.0–100.0)
Platelets: 293 10*3/uL (ref 150–400)
RBC: 3.68 MIL/uL — ABNORMAL LOW (ref 3.87–5.11)
RDW: 14.5 % (ref 11.5–15.5)
WBC: 9.1 10*3/uL (ref 4.0–10.5)

## 2015-06-04 LAB — GLUCOSE, CAPILLARY
GLUCOSE-CAPILLARY: 136 mg/dL — AB (ref 65–99)
Glucose-Capillary: 137 mg/dL — ABNORMAL HIGH (ref 65–99)
Glucose-Capillary: 145 mg/dL — ABNORMAL HIGH (ref 65–99)
Glucose-Capillary: 163 mg/dL — ABNORMAL HIGH (ref 65–99)
Glucose-Capillary: 133 mg/dL — ABNORMAL HIGH (ref 65–99)
Glucose-Capillary: 158 mg/dL — ABNORMAL HIGH (ref 65–99)

## 2015-06-04 LAB — MAGNESIUM: Magnesium: 1.6 mg/dL — ABNORMAL LOW (ref 1.7–2.4)

## 2015-06-04 MED ORDER — FUROSEMIDE 10 MG/ML IJ SOLN
40.0000 mg | Freq: Every day | INTRAMUSCULAR | Status: DC
Start: 2015-06-04 — End: 2015-06-04

## 2015-06-04 MED ORDER — METOCLOPRAMIDE HCL 5 MG/ML IJ SOLN
5.0000 mg | Freq: Four times a day (QID) | INTRAMUSCULAR | Status: DC
Start: 2015-06-04 — End: 2015-06-06
  Administered 2015-06-04 – 2015-06-06 (×8): 5 mg via INTRAVENOUS
  Filled 2015-06-04 (×8): qty 2

## 2015-06-04 MED ORDER — POTASSIUM CHLORIDE 10 MEQ/100ML IV SOLN
10.0000 meq | INTRAVENOUS | Status: AC
  Administered 2015-06-04 (×4): 10 meq via INTRAVENOUS
  Filled 2015-06-04: qty 100

## 2015-06-04 MED ORDER — FUROSEMIDE 10 MG/ML IJ SOLN
40.0000 mg | Freq: Every day | INTRAMUSCULAR | Status: AC
  Administered 2015-06-04 – 2015-06-05 (×2): 40 mg via INTRAVENOUS
  Filled 2015-06-04 (×2): qty 4

## 2015-06-04 NOTE — Progress Notes (Signed)
[facility] PROGRESS NOTE  Sharon [name] [id number] DOB: August 13, 1945 DOA: 05/27/2015  PCP: Osborne Casco, MD  Brief HPI: 69yo Caucasian female with past medical history of type 2 diabetes, hypertension, hyperlipidemia, sleep apnea, presented with nausea, vomiting, diarrhea and abdominal pain. She underwent a CT scan which revealed sigmoid diverticulitis. She was admitted for further management. Patient was very slow to improve despite standard management. Repeat films suggested colonic obstruction. NG tube was placed. GI is following.  Past medical history:  Past Medical History  Diagnosis Date  . Diabetes mellitus   . Hypertension   . CHF (congestive heart failure) ([place])   . Asthma   . Osteoarthritis   . Anxiety   . GERD (gastroesophageal reflux disease)   . Kidney stones   . Hypercholesteremia   . Thrombophlebitis     Consultants: [facility]  Procedures: none  Antibiotics: On ciprofloxacin and Flagyl  Subjective: Patient does not feel as good as yesterday. Had a lot of nausea overnight. No vomiting. No significant abdominal pain. She does complain of some shortness of breath. No cough. No chest pain.  Objective: Vital Signs  Filed Vitals:   06/03/15 2110 06/04/15 0432 06/04/15 0551 06/04/15 1155  BP: 171/60 186/66 153/65 165/77  Pulse: 73 75 83   Temp: 98.6 F (37 C) 98.8 F (37.1 C)    TempSrc: Oral Oral    Resp: 18 18    Height:      Weight:  127.96 kg (282 lb 1.6 oz)    SpO2: 95% 98%      Intake/Output Summary (Last 24 hours) at 06/04/15 1249 Last data filed at 06/04/15 1239  Gross per 24 hour  Intake    180 ml  Output    800 ml  Net   -620 ml   Filed Weights   05/31/15 0559 06/02/15 0445 06/04/15 0432  Weight: 129.321 kg (285 lb 1.6 oz) 127.869 kg (281 lb 14.4 oz) 127.96 kg (282 lb 1.6 oz)    General appearance: alert, cooperative, appears stated age and no distress Resp: clear to auscultation bilaterally Cardio:  regular rate and rhythm, S1, S2 normal, no murmur, click, rub or gallop GI: Abdomen continues to be soft. Nontender. Bowel sounds are present.  Extremities: extremities normal, atraumatic, no cyanosis or edema Neurologic: no focal deficits  Lab Results:  Basic Metabolic Panel:  Recent Labs Lab 05/30/15 0250 06/01/15 0528 06/02/15 0412 06/03/15 0458 06/04/15 0514  NA 143 138 139 136 137  K 3.7 2.9* 3.7 3.2* 3.6  CL 109 107 110 108 108  CO2 26 23 20* 22 19*  GLUCOSE 153* 140* 142* 166* 170*  BUN 7 <5* <5* <5* <5*  CREATININE 0.80 0.81 0.78 0.74 0.74  CALCIUM 8.8* 8.5* 8.4* 8.2* 8.3*  MG  --  1.7 1.6*  --  1.6*   Liver Function Tests:  Recent Labs Lab 06/03/15 0458  AST 12*  ALT 12*  ALKPHOS 72  BILITOT 0.4  PROT 5.3*  ALBUMIN 2.4*   CBC:  Recent Labs Lab 05/30/15 0250 06/01/15 0528 06/02/15 0412 06/03/15 0458 06/04/15 0653  WBC 9.9 7.1 7.6 7.9 9.1  HGB 9.7* 9.2* 10.5* 9.5* 10.1*  HCT 31.4* 29.8* 32.7* 30.4* 32.5*  MCV 89.5 89.2 87.7 88.1 88.3  PLT 338 281 239 268 293   CBG:  Recent Labs Lab 06/03/15 1612 06/03/15 1929 06/03/15 2314 06/04/15 0405 06/04/15 0750  GLUCAP 135* 118* 136* 137* 145*    Recent Results (from the past 240 hour(s))  C difficile quick scan w PCR reflex     Status: None   Collection Time: 05/28/15 10:35 AM  Result Value Ref Range Status   C Diff antigen NEGATIVE NEGATIVE Final   C Diff toxin NEGATIVE NEGATIVE Final   C Diff interpretation Negative for toxigenic C. difficile  Final      Studies/Results: Ct Abdomen Pelvis W Contrast  06/02/2015  CLINICAL DATA:  Diverticulitis with abdominal pain. EXAM: CT ABDOMEN AND PELVIS WITH CONTRAST TECHNIQUE: Multidetector CT imaging of the abdomen and pelvis was performed using the standard protocol following bolus administration of intravenous contrast. CONTRAST:  140mL OMNIPAQUE IOHEXOL 300 MG/ML  SOLN COMPARISON:  05/27/2015 FINDINGS: Lower chest: Motion degradation at the bases.  Grossly clear. Mild cardiomegaly. Small bilateral pleural effusions are new. Hepatobiliary: Normal liver. Cholecystectomy. Intrahepatic ducts upper normal. Mild common duct dilatation at 13 mm. Pancreas: Mild pancreatic atrophy. Suspect ductectasia within the pancreatic uncinate process (image 34, series 2). An underlying cystic lesion of 1.5 cm is possible. This has been present over prior exams. No main duct dilatation. Spleen: Normal in size, without focal abnormality. Adrenals/Urinary Tract: Right adrenal nodule is unchanged in size and was consistent with an adenoma on the initial exam. Low normal left adrenal. Too small to characterize lesions in the left kidney. A lower pole right renal lesion is likely a cyst. No hydronephrosis. Normal urinary bladder. Stomach/Bowel: Nasogastric tube terminating at the body of the stomach. Wall thickening involving the sigmoid with mild surrounding edema. The severity is similar, but the length of colonic inflammation is slightly increased, involving the majority of the sigmoid today. No extraluminal free gas or pericolonic abscess. Fluid throughout the colon suggests a diarrheal state. Normal terminal ileum. Normal small bowel. Vascular/Lymphatic: Normal caliber of the aorta and branch vessels. No abdominopelvic adenopathy. Reproductive: Hysterectomy.  No adnexal mass. Other: Anasarca. Musculoskeletal: Degenerative disc disease at L4-5 with prominent disc bulge. IMPRESSION: 1. Uncomplicated persistent or recurrent diverticulitis. This extends over a longer segment of the sigmoid today, without evidence of free perforation or complicating abscess. 2. Cholecystectomy with mild biliary duct dilatation. Correlate with bilirubin levels. If these are normal, this is likely within normal variation. 3. Pancreatic atrophy. Suggestion of side branch duct ectasia versus a cystic lesion in the pancreatic uncinate process. Consider follow-up with MRI/ MRCP at 1 year for further  characterization and to confirm stability. 4. Right adrenal adenoma. Electronically Signed   By: Abigail Miyamoto M.D.   On: 06/02/2015 18:14   Dg Chest Port 1 View  06/04/2015  CLINICAL DATA:  Shortness of breath. EXAM: PORTABLE CHEST 1 VIEW COMPARISON:  06/27/2014 . FINDINGS: New NG tube noted with tip below left hemidiaphragm. Cardiomegaly with mild pulmonary vascular prominence and interstitial prominence. Low lung volumes. No pleural effusion or pneumothorax. IMPRESSION: 1. NG tube noted with tip below left hemidiaphragm. 2. Cardiomegaly with mild pulmonary vascular prominence and interstitial prominence suggesting mild congestive heart failure . Electronically Signed   By: Marcello Moores  Register   On: 06/04/2015 09:02    Medications:  Scheduled: . ciprofloxacin  400 mg Intravenous Q12H  . enoxaparin (LOVENOX) injection  60 mg Subcutaneous Q24H  . furosemide  40 mg Intravenous Daily  . insulin aspart  0-9 Units Subcutaneous Q4H  . loratadine  10 mg Oral Daily  . metoCLOPramide (REGLAN) injection  5 mg Intravenous 4 times per day  . metoprolol  2.5 mg Intravenous 4 times per day  . metronidazole  500 mg Intravenous Q8H  . potassium  chloride  10 mEq Intravenous Q1 Hr x 4   Continuous: . dextrose 5 % and 0.45 % NaCl with KCl 20 mEq/L 10 mL/hr at 06/04/15 0913   [id number] **OR** acetaminophen, diphenhydrAMINE, hydrALAZINE, HYDROmorphone (DILAUDID) injection, LORazepam, [DISCONTINUED] ondansetron **OR** ondansetron (ZOFRAN) IV, phenol, promethazine  Assessment/Plan:  Principal Problem:   Sigmoid diverticulitis Active Problems:   Hypertension   Diverticulitis   Hypokalemia   Diabetes mellitus type 2, controlled (HCC)   Chronic anemia   Diverticulitis of large intestine without perforation or abscess without bleeding    Acute Sigmoid diverticulitis Patient once again feels very nauseated. Discussed with Dr. Penelope Coop with gastroenterology. He recommends removing patient's NG tube. Also  discussed with him the possibility of gastroparesis considering her history of diabetes. Give a trial of intravenous Reglan. Her repeat CT scan did not show any concerning findings including abscess or obstruction or perforation. Continue clear liquid diet. No further episodes of fever. Continue ciprofloxacin and Flagyl intravenously for now. Initial CT abdomen showed persistent or recurrent diverticulitis in the sigmoid colon, slightly increased in the degree of inflammation from the prior examination. No abscess or perforation. Abdominal films suggested a distal colonic obstruction which was not seen on the repeat CT. C. difficile negative.  Acute on chronic diastolic CHF, grade 2 diastolic dysfunction She complained of dyspnea this morning. Chest x-ray was done which revealed evidence for interstitial edema. She likely has acute on chronic diastolic failure. Probably made worse due to fluids. She is noted to be positive by more than 10 L during this hospital stay. She will be given intravenous Lasix. 2-D echo in 04/2013 had shown EF of 55-60% with grade 2 diastolic dysfunction, follow I's and O's closely. Daily weights.  History of essential Hypertension Blood pressure is better controlled than before, but not optimum. This is due to an inability to take oral medications along with pain. Continue scheduled metoprolol intravenously. Hydralazine intravenously as needed. Consider resuming her antihypertensive agents tomorrow hopefully when her nausea has improved.  Hypokalemia/hypomagnesemia Potassium level is better. Magnesium is noted to be low. This will be repleted.   Diabetes mellitus type 2, controlled Continue sliding scale insulin. CBGs are reasonably well controlled.  Chronic anemia Hemoglobin is stable   History of Anxiety Patient had an episode of panic attack during this hospitalization. No further episodes. Resume Paxil when she is able to take orally. Ativan as needed for  now.  Mildly dilated biliary ducts noted on CT scan LFTs and bilirubin normal.  Pancreatic atrophy, along with possible cystic abnormality noted on CT scan Will need outpatient follow-up and repeat imaging studies in 1 year.  DVT Prophylaxis: Lovenox    Code Status: full code  Family Communication: discussed with the patient  Disposition Plan: Patient has had a setback overnight. We will see how she does with a trial of Reglan. NG tube to be removed today. Lasix to be given. Start mobilizing.    LOS: 7 days   [facility] Pager [phone] 06/04/2015, 12:49 PM  If 7PM-7AM, please contact night-coverage at [email or web address], password [facility]

## 2015-06-04 NOTE — Progress Notes (Signed)
[facility] Progress Note  Subjective: No major complaints of abdominal pain today. Her principal complaint is that of ongoing nausea. She had previously asked for the NG tube to be placed to see if it would help with her nausea but it has not and therefore she would like to have it removed.  Objective: Vital signs in last 24 hours: Temp:  [98.6 F (37 C)-99.6 F (37.6 C)] 98.8 F (37.1 C) (11/21 0432) Pulse Rate:  [69-83] 83 (11/21 0551) Resp:  [18-20] 18 (11/21 0432) BP: (153-186)/(60-66) 153/65 mmHg (11/21 0551) SpO2:  [95 %-98 %] 98 % (11/21 0432) Weight:  [127.96 kg (282 lb 1.6 oz)] 127.96 kg (282 lb 1.6 oz) (11/21 0432) Weight change:    PE:  No distress  Heart regular rhythm  Lungs clear  Abdomen: Bowel sounds present, soft, tender in the left lower quadrant  Lab Results: Results for orders placed or performed during the hospital encounter of 05/27/15 (from the past 24 hour(s))  Glucose, capillary     Status: Abnormal   Collection Time: 06/03/15 12:20 PM  Result Value Ref Range   Glucose-Capillary 144 (H) 65 - 99 mg/dL  Glucose, capillary     Status: Abnormal   Collection Time: 06/03/15  4:12 PM  Result Value Ref Range   Glucose-Capillary 135 (H) 65 - 99 mg/dL  Glucose, capillary     Status: Abnormal   Collection Time: 06/03/15  7:29 PM  Result Value Ref Range   Glucose-Capillary 118 (H) 65 - 99 mg/dL   Comment 1 Notify RN   Glucose, capillary     Status: Abnormal   Collection Time: 06/03/15 11:14 PM  Result Value Ref Range   Glucose-Capillary 136 (H) 65 - 99 mg/dL   Comment 1 Notify RN   Glucose, capillary     Status: Abnormal   Collection Time: 06/04/15  4:05 AM  Result Value Ref Range   Glucose-Capillary 137 (H) 65 - 99 mg/dL   Comment 1 Notify RN   Basic metabolic panel     Status: Abnormal   Collection Time: 06/04/15  5:14 AM  Result Value Ref Range   Sodium 137 135 - 145 mmol/L   Potassium 3.6 3.5 - 5.1 mmol/L   Chloride 108 101 - 111  mmol/L   CO2 19 (L) 22 - 32 mmol/L   Glucose, Bld 170 (H) 65 - 99 mg/dL   BUN <5 (L) 6 - 20 mg/dL   Creatinine, Ser 0.74 0.44 - 1.00 mg/dL   Calcium 8.3 (L) 8.9 - 10.3 mg/dL   GFR calc non Af Amer >60 >60 mL/min   GFR calc Af Amer >60 >60 mL/min   Anion gap 10 5 - 15  Magnesium     Status: Abnormal   Collection Time: 06/04/15  5:14 AM  Result Value Ref Range   Magnesium 1.6 (L) 1.7 - 2.4 mg/dL  CBC     Status: Abnormal   Collection Time: 06/04/15  6:53 AM  Result Value Ref Range   WBC 9.1 4.0 - 10.5 K/uL   RBC 3.68 (L) 3.87 - 5.11 MIL/uL   Hemoglobin 10.1 (L) 12.0 - 15.0 g/dL   HCT 32.5 (L) 36.0 - 46.0 %   MCV 88.3 78.0 - 100.0 fL   MCH 27.4 26.0 - 34.0 pg   MCHC 31.1 30.0 - 36.0 g/dL   RDW 14.5 11.5 - 15.5 %   Platelets 293 150 - 400 K/uL  Glucose, capillary     Status: Abnormal  Collection Time: 06/04/15  7:50 AM  Result Value Ref Range   Glucose-Capillary 145 (H) 65 - 99 mg/dL    Studies/Results: Dg Chest Port 1 View  06/04/2015  CLINICAL DATA:  Shortness of breath. EXAM: PORTABLE CHEST 1 VIEW COMPARISON:  06/27/2014 . FINDINGS: New NG tube noted with tip below left hemidiaphragm. Cardiomegaly with mild pulmonary vascular prominence and interstitial prominence. Low lung volumes. No pleural effusion or pneumothorax. IMPRESSION: 1. NG tube noted with tip below left hemidiaphragm. 2. Cardiomegaly with mild pulmonary vascular prominence and interstitial prominence suggesting mild congestive heart failure . Electronically Signed   By: Marcello Moores  Register   On: 06/04/2015 09:02      Assessment: Diverticulitis  Plan:   Continue IV antibiotics for diverticulitis. Discontinue NG tube. Continue anti-emetics.    Cassell Clement 06/04/2015, 9:53 AM  Pager: [phone] If no answer or after 5 PM call [phone]

## 2015-06-04 NOTE — Care Management Note (Signed)
Case Management Note  Patient Details  Name: Sharon [name] MRN: [id number] Date of Birth: 1945-10-19  Subjective/Objective:                    Action/Plan:   Expected Discharge Date:                  Expected Discharge Plan:  Home/Self Care  In-House Referral:     Discharge planning Services     Post Acute Care Choice:    Choice offered to:     DME Arranged:    DME Agency:     HH Arranged:    [place] Agency:     Status of Service:  In process, will continue to follow  Medicare Important Message Given:    Date Medicare IM Given:    Medicare IM give by:    Date Additional Medicare IM Given:    Additional Medicare Important Message give by:     If discussed at [place] of Stay Meetings, dates discussed:    Additional Comments:UR updated   Marilu Favre, RN 06/04/2015, 1:43 PM

## 2015-06-04 NOTE — Progress Notes (Signed)
Physical Therapy Treatment Patient Details Name: Sharon [name] MRN: [id number] DOB: [birth year] Today's Date: 06/04/2015    History of Present Illness 69 y.o. female admitted to [facility] on 05/27/15 for abdominal pain, N/V.  Pt dx with acute sigmoid diverticulitis.  NG tube taken out. Pt with significant PMHx of DM, HTN, CHF, asthma, and anxiety.    PT Comments    Pt was on bedside commode upon entrance.; pt was visibly shaking. Pt stated she could not get up and walk due to feeling shaky. Pt was able to maintain balance standing with RW with supervision as SPTA cleaned pt up after using commode. Pt was able to perform exercises in recliner with minimal complaints of pain only during hip abduction and heel slides due to her hip bursitis.   Follow Up Recommendations  No PT follow up     Equipment Recommendations  None recommended by PT       Precautions / Restrictions Precautions Precautions: Fall Restrictions Weight Bearing Restrictions: No    Mobility                    Transfers Overall transfer level: Needs assistance Equipment used: Rolling walker (2 wheeled) Transfers: Sit to/from Stand Sit to Stand: Min guard         General transfer comment: Min guard assistance to maintain balance and safety.  Ambulation/Gait Ambulation/Gait assistance: Min guard Ambulation Distance (Feet): 10 Feet Assistive device: Rolling walker (2 wheeled) Gait Pattern/deviations: Step-through pattern;Wide base of support Gait velocity: decreased Gait velocity interpretation: <1.8 ft/sec, indicative of risk for recurrent falls General Gait Details: Pt able to walk from bed side commode to walker. Pt slightly unsteady, requiring min guard for safety.                      Balance             Standing balance-Leahy Scale: Poor Standing balance comment: pt requires support from RW at this time.                    Cognition Arousal/Alertness:  Awake/alert Behavior During Therapy: WFL for tasks assessed/performed Overall Cognitive Status: Within Functional Limits for tasks assessed                      Exercises General Exercises - Lower Extremity Quad Sets: AROM;Both;10 reps;Seated (hold for 5 seconds) Long Arc Quad: AROM;Both;10 reps;Seated Heel Slides: AROM;Both;10 reps;Seated (complaints of pain ) Hip ABduction/ADduction: AROM;5 reps;Both;Seated (unable to do full reps due to pain in hips) Marching: AROM;10 reps;Both;Seated       Pertinent Vitals/Pain Pain Assessment: No/denies pain Pain Score: 0-No pain              Frequency  Min 3X/week    PT Plan Current plan remains appropriate       End of Session Equipment Utilized During Treatment: Gait belt Activity Tolerance: Patient limited by fatigue Patient left: in chair;with call bell/phone within reach     Time: 1147-1209 PT Time Calculation (min) (ACUTE ONLY): 22 min  Charges:    [place], [place] OFFICE   06/04/2015, 12:38 PM

## 2015-06-04 NOTE — Progress Notes (Signed)
OT Cancellation Note  Patient Details Name: Sharon [name] MRN: [id number] DOB: [birth year]   Cancelled Treatment:    Reason Eval/Treat Not Completed: Other (comment) pt reports just getting settled after transfer to 3n1. Pt agreeable to AM evaluation with OT.  Vonita Moss   OTR/L Pager: [phone] Office: [phone] .  06/04/2015, 1:50 PM

## 2015-06-04 NOTE — Progress Notes (Signed)
Initial Nutrition Assessment  DOCUMENTATION CODES:   Morbid obesity  INTERVENTION:   -RD will follow for diet advancement and supplement diet as appropriate -Consider initiation of nutrition support, if unable to advance PO diet within 7-10 days of admission   NUTRITION DIAGNOSIS:   Inadequate oral intake related to altered GI function as evidenced by meal completion < 25%.  GOAL:   Patient will meet greater than or equal to 90% of their needs  MONITOR:   PO intake, Diet advancement, Labs, Weight trends, Skin, I & O's  REASON FOR ASSESSMENT:   NPO/Clear Liquid Diet    ASSESSMENT:   Sharon [name] is a 69 y.o. female with history of diabetes mellitus type 2, hypertension, hyperlipidemia, OSA and diastolic CHF presents to the ER because of persistent nausea vomiting and diarrhea over the last 2 days with left lower quadrant abdominal pain. Patient's symptoms started about 1 week ago when patient had left lower quadrant pain and patient's gastroenterologist place patient on Augmentin. Patient states at that time patient's pain improved gradually and 3 days ago patient had eaten outside following which patient's symptoms started coming back with nausea vomiting and worsening abdominal pain mostly in the left lower quadrant. Patient has a persistent pain in the left lower quadrant at times has a colicky nature. Denies any blood in the vomitus or diarrhea. Patient has been unable to keep in anything last 2 days and has not taken her medications including insulin. CT abdomen and pelvis done in the ER shows recurrent sigmoid diverticulitis and patient has been admitted for further management. Denies any chest pain or shortness of breath. Labs also revealed hypokalemia.  Pt admitted with sigmoid diverticulitis. She remains on IV antibiotics. Abdominal films are negative for obstruction.   Spoke with pt at bedside, who reports fluctuating po intake over the past 7-8 weeks, as a result of  diverticulitis. PTA, she reports she was able to eat well on some days, but unable to eat much on others. Since being in the hospital, she has had inadequate nutrition over the past 7 days (NPO/clear liquids). Pt reports poor tolerance of liquids, due to constant nausea.   NGT connected to suction at time of visit, however, per GI note, noted orders to be removed today.   Pt reports UBW around 267#.   Nutrition-Focused physical exam completed. Findings are no fat depletion, no muscle depletion, and mild edema. Edema present in upper and lower extremities.   Pt has been without adequate nutrition for 7 days. If unable to advance past a clear liquid diet, consider initiation of nutrition support.   Labs reviewed: CBGS: 137-145.  Diet Order:  Diet clear liquid Room service appropriate?: Yes; Fluid consistency:: Thin  Skin:  Reviewed, no issues  Last BM:  06/04/15  Height:   Ht Readings from Last 1 Encounters:  05/28/15 4\' 11"  (1.499 m)    Weight:   Wt Readings from Last 1 Encounters:  06/04/15 282 lb 1.6 oz (127.96 kg)    Ideal Body Weight:  44.7 kg  BMI:  Body mass index is 56.95 kg/(m^2).  Estimated Nutritional Needs:   Kcal:  1600-1800  Protein:  75-90 grams  Fluid:  1.6-1.8 L  EDUCATION NEEDS:   No education needs identified at this time  Kentarius Partington A. Jimmye Norman, RD, LDN, CDE Pager: [phone] After hours Pager: [phone]

## 2015-06-05 DIAGNOSIS — E1143 Type 2 diabetes mellitus with diabetic autonomic (poly)neuropathy: Secondary | ICD-10-CM

## 2015-06-05 DIAGNOSIS — K3184 Gastroparesis: Secondary | ICD-10-CM

## 2015-06-05 LAB — CBC
HCT: 30.7 % — ABNORMAL LOW (ref 36.0–46.0)
HEMOGLOBIN: 9.8 g/dL — AB (ref 12.0–15.0)
MCH: 27.7 pg (ref 26.0–34.0)
MCHC: 31.9 g/dL (ref 30.0–36.0)
MCV: 86.7 fL (ref 78.0–100.0)
PLATELETS: 272 10*3/uL (ref 150–400)
RBC: 3.54 MIL/uL — AB (ref 3.87–5.11)
RDW: 14.5 % (ref 11.5–15.5)
WBC: 10.3 10*3/uL (ref 4.0–10.5)

## 2015-06-05 LAB — GLUCOSE, CAPILLARY
GLUCOSE-CAPILLARY: 132 mg/dL — AB (ref 65–99)
GLUCOSE-CAPILLARY: 126 mg/dL — AB (ref 65–99)
GLUCOSE-CAPILLARY: 182 mg/dL — AB (ref 65–99)
Glucose-Capillary: 156 mg/dL — ABNORMAL HIGH (ref 65–99)
Glucose-Capillary: 169 mg/dL — ABNORMAL HIGH (ref 65–99)
Glucose-Capillary: 151 mg/dL — ABNORMAL HIGH (ref 65–99)

## 2015-06-05 LAB — BASIC METABOLIC PANEL
ANION GAP: 10 (ref 5–15)
CHLORIDE: 107 mmol/L (ref 101–111)
CO2: 21 mmol/L — ABNORMAL LOW (ref 22–32)
Calcium: 6.7 mg/dL — ABNORMAL LOW (ref 8.9–10.3)
Creatinine, Ser: 0.79 mg/dL (ref 0.44–1.00)
GFR calc Af Amer: >60 mL/min (ref >60)
Glucose, Bld: 126 mg/dL — ABNORMAL HIGH (ref 65–99)
POTASSIUM: 4.4 mmol/L (ref 3.5–5.1)
SODIUM: 138 mmol/L (ref 135–145)

## 2015-06-05 MED ORDER — PAROXETINE HCL 30 MG PO TABS
60.0000 mg | ORAL_TABLET | Freq: Every day | ORAL | Status: DC
  Administered 2015-06-05 – 2015-06-07 (×3): 60 mg via ORAL
  Filled 2015-06-05: qty 2
  Filled 2015-06-05: qty 1
  Filled 2015-06-05 (×2): qty 2

## 2015-06-05 MED ORDER — POTASSIUM CHLORIDE CRYS ER 20 MEQ PO TBCR
20.0000 meq | EXTENDED_RELEASE_TABLET | Freq: Once | ORAL | Status: AC
  Administered 2015-06-05: 20 meq via ORAL
  Filled 2015-06-05: qty 1

## 2015-06-05 MED ORDER — FUROSEMIDE 40 MG PO TABS
40.0000 mg | ORAL_TABLET | Freq: Every day | ORAL | Status: DC
  Administered 2015-06-06 – 2015-06-07 (×2): 40 mg via ORAL
  Filled 2015-06-05 (×2): qty 1

## 2015-06-05 MED ORDER — METOPROLOL SUCCINATE ER 25 MG PO TB24: 25.0000 mg | ORAL_TABLET | Freq: Every day | ORAL | Status: DC

## 2015-06-05 MED ORDER — METOPROLOL SUCCINATE ER 50 MG PO TB24
50.0000 mg | ORAL_TABLET | Freq: Every day | ORAL | Status: DC
  Administered 2015-06-05 – 2015-06-07 (×3): 50 mg via ORAL
  Filled 2015-06-05 (×3): qty 1

## 2015-06-05 NOTE — Evaluation (Signed)
Occupational Therapy Evaluation Patient Details Name: Sharon [name] MRN: [id number] DOB: 1946/05/09 Today's Date: 06/05/2015    History of Present Illness 69 y.o. female admitted to [facility] on 05/27/15 for abdominal pain, N/V.  Pt dx with acute sigmoid diverticulitis.  NG tube taken out 06/04/15. Pt with significant PMHx of DM, HTN, CHF, asthma, and anxiety.   Clinical Impression   Patient evaluated by Occupational Therapy with no further acute OT needs identified. All education has been completed and the patient has no further questions. See below for any follow-up Occupational Therapy or equipment needs. OT to sign off. Thank you for referral.      Follow Up Recommendations  No OT follow up    Equipment Recommendations  None recommended by OT (declined 3n1)    Recommendations for Other Services       Precautions / Restrictions Precautions Precautions: Fall      Mobility Bed Mobility Overal bed mobility: Modified Independent                Transfers Overall transfer level: Modified independent Equipment used: Rolling walker (2 wheeled) Transfers: Sit to/from Stand           General transfer comment: pt reaching for environmental objects and releasing RW. pT advised on safety with RW.     Balance           Standing balance support: During functional activity;Single extremity supported Standing balance-Leahy Scale: Good Standing balance comment: pt able to static stand and complete peri care                            ADL Overall ADL's : At baseline                                       General ADL Comments: Pt static standing and able to complete front peri care. Pt reports requiring sitting position for posterior peri care and decr space on bari 3n1. Pt able to sit on EOB and bring BIL LE up to reach feet. pt will have sister in law (A) upon d/c to (A) with setup of environment per patient. OT discussing with patient  environmental setup and need for adult diaper to help with urgency to void bladder/ bowel. pt plans to use an oversized female pad     Vision     Perception     Praxis      Pertinent Vitals/Pain Pain Assessment: No/denies pain     Hand Dominance Right   Extremity/Trunk Assessment Upper Extremity Assessment Upper Extremity Assessment: Overall WFL for tasks assessed   Lower Extremity Assessment Lower Extremity Assessment: Defer to PT evaluation   Cervical / Trunk Assessment Cervical / Trunk Assessment: Normal   Communication Communication Communication: No difficulties   Cognition Arousal/Alertness: Awake/alert Behavior During Therapy: WFL for tasks assessed/performed Overall Cognitive Status: Within Functional Limits for tasks assessed                     General Comments       Exercises       Shoulder Instructions      Home Living Family/patient expects to be discharged to:: Private residence Living Arrangements: Alone Available Help at Discharge: Family Type of Home: Apartment Home Access: Level entry     Home Layout: One level     Bathroom Shower/Tub: Tub/shower  unit;Curtain   Bathroom Toilet: Handicapped height Bathroom Accessibility: Yes   Home Equipment: Grab bars - tub/shower;Grab bars - toilet;Other (comment);Cane - quad   Additional Comments: sister in law calls and stops by      Prior Functioning/Environment Level of Independence: Independent             OT Diagnosis:     OT Problem List:     OT Treatment/Interventions:      OT Goals(Current goals can be found in the care plan section) Acute Rehab OT Goals Patient Stated Goal: today is a good day OT Goal Formulation: With patient Time For Goal Achievement: 06/19/15 Potential to Achieve Goals: Good  OT Frequency:     Barriers to D/C:            Co-evaluation              End of Session Equipment Utilized During Treatment: Rolling walker Nurse  Communication: Mobility status;Precautions  Activity Tolerance: Patient tolerated treatment well Patient left: in chair;with call bell/phone within reach   Time: 0714-0734 OT Time Calculation (min): 20 min Charges:  OT General Charges $OT Visit: 1 Procedure OT Evaluation $Initial OT Evaluation Tier I: 1 Procedure G-Codes:    Parke Poisson B 06/10/15, 7:50 AM   Jeri Modena   OTR/L [phone] Office: [phone] .

## 2015-06-05 NOTE — Progress Notes (Signed)
[facility] Progress Note  Subjective: The patient is doing well today. She denies abdominal pain. The NG tube was taken out. She was started on Reglan yesterday. Her nausea is gone. She feels good and would like to advance her diet  Objective: Vital signs in last 24 hours: Temp:  [98.6 F (37 C)-99.7 F (37.6 C)] 98.6 F (37 C) (11/22 0446) Pulse Rate:  [75-96] 75 (11/22 0446) Resp:  [18] 18 (11/22 0446) BP: (134-174)/(58-77) 153/58 mmHg (11/22 0446) SpO2:  [96 %-97 %] 96 % (11/22 0446) Weight:  [124.585 kg (274 lb 10.6 oz)] 124.585 kg (274 lb 10.6 oz) (11/22 0446) Weight change: -3.375 kg (-7 lb 7 oz)   PE:  No distress  Heart regular rhythm  Lungs clear  Abdomen soft and nontender  Lab Results: Results for orders placed or performed during the hospital encounter of 05/27/15 (from the past 24 hour(s))  Glucose, capillary     Status: Abnormal   Collection Time: 06/04/15 12:37 PM  Result Value Ref Range   Glucose-Capillary 163 (H) 65 - 99 mg/dL  Glucose, capillary     Status: Abnormal   Collection Time: 06/04/15  4:49 PM  Result Value Ref Range   Glucose-Capillary 133 (H) 65 - 99 mg/dL  Glucose, capillary     Status: Abnormal   Collection Time: 06/04/15  7:18 PM  Result Value Ref Range   Glucose-Capillary 136 (H) 65 - 99 mg/dL   Comment 1 Notify RN   Glucose, capillary     Status: Abnormal   Collection Time: 06/04/15 11:02 PM  Result Value Ref Range   Glucose-Capillary 158 (H) 65 - 99 mg/dL   Comment 1 Notify RN   Glucose, capillary     Status: Abnormal   Collection Time: 06/05/15  4:11 AM  Result Value Ref Range   Glucose-Capillary 132 (H) 65 - 99 mg/dL   Comment 1 Notify RN   CBC     Status: Abnormal   Collection Time: 06/05/15  6:20 AM  Result Value Ref Range   WBC 10.3 4.0 - 10.5 K/uL   RBC 3.54 (L) 3.87 - 5.11 MIL/uL   Hemoglobin 9.8 (L) 12.0 - 15.0 g/dL   HCT 30.7 (L) 36.0 - 46.0 %   MCV 86.7 78.0 - 100.0 fL   MCH 27.7 26.0 - 34.0 pg   MCHC 31.9 30.0 - 36.0 g/dL   RDW 14.5 11.5 - 15.5 %   Platelets 272 150 - 400 K/uL  Basic metabolic panel     Status: Abnormal   Collection Time: 06/05/15  6:20 AM  Result Value Ref Range   Sodium 138 135 - 145 mmol/L   Potassium 4.4 3.5 - 5.1 mmol/L   Chloride 107 101 - 111 mmol/L   CO2 21 (L) 22 - 32 mmol/L   Glucose, Bld 126 (H) 65 - 99 mg/dL   BUN <5 (L) 6 - 20 mg/dL   Creatinine, Ser 0.79 0.44 - 1.00 mg/dL   Calcium 6.7 (L) 8.9 - 10.3 mg/dL   GFR calc non Af Amer >60 >60 mL/min   GFR calc Af Amer >60 >60 mL/min   Anion gap 10 5 - 15  Glucose, capillary     Status: Abnormal   Collection Time: 06/05/15  7:39 AM  Result Value Ref Range   Glucose-Capillary 126 (H) 65 - 99 mg/dL   Comment 1 Notify RN     Studies/Results: No results found.    Assessment: #1. Sigmoid diverticulitis  #2. Gastroparesis (  the patient is responding to Reglan, she told me she was diagnosed with gastroparesis in the past.)  Plan:   Continue current therapy. Advance to full liquid diet. Home soon if she continues to improve clinically.    Sharon [name] 06/05/2015, 9:59 AM  Pager: [phone] If no answer or after 5 PM call [phone]

## 2015-06-05 NOTE — Progress Notes (Signed)
[facility] PROGRESS NOTE  Sharon [name] [id number] DOB: 1945/07/20 DOA: 05/27/2015  PCP: Osborne Casco, MD  Brief HPI: 69yo Caucasian female with past medical history of type 2 diabetes, hypertension, hyperlipidemia, sleep apnea, presented with nausea, vomiting, diarrhea and abdominal pain. She underwent a CT scan which revealed sigmoid diverticulitis. She was admitted for further management. Patient was very slow to improve despite standard management. Repeat films suggested colonic obstruction. NG tube was placed. GI is following.  Past medical history:  Past Medical History  Diagnosis Date  . Diabetes mellitus   . Hypertension   . CHF (congestive heart failure) ([place])   . Asthma   . Osteoarthritis   . Anxiety   . GERD (gastroesophageal reflux disease)   . Kidney stones   . Hypercholesteremia   . Thrombophlebitis     Consultants: [facility]  Procedures: none  Antibiotics: On ciprofloxacin and Flagyl  Subjective: Patient feels much better this morning. Nausea has resolved. Denies any abdominal pain. Requesting advancement of diet. Breathing is also much improved. Denies chest pain.  Objective: Vital Signs  Filed Vitals:   06/04/15 1600 06/04/15 2049 06/04/15 2110 06/05/15 0446  BP: 143/64 169/77 134/60 153/58  Pulse:  96  75  Temp:  99.7 F (37.6 C)  98.6 F (37 C)  TempSrc:  Oral  Oral  Resp:  18  18  Height:      Weight:    124.585 kg (274 lb 10.6 oz)  SpO2:  96%  96%    Intake/Output Summary (Last 24 hours) at 06/05/15 0923 Last data filed at 06/05/15 0300  Gross per 24 hour  Intake 1069.17 ml  Output   1400 ml  Net -330.83 ml   Filed Weights   06/02/15 0445 06/04/15 0432 06/05/15 0446  Weight: 127.869 kg (281 lb 14.4 oz) 127.96 kg (282 lb 1.6 oz) 124.585 kg (274 lb 10.6 oz)    General appearance: alert, cooperative, appears stated age and no distress Resp: Improved air entry bilaterally. Cardio: regular rate and  rhythm, S1, S2 normal, no murmur, click, rub or gallop GI: Abdomen continues to be soft. Nontender. Bowel sounds are present.  Neurologic: Alert and oriented 3. Cranial nerves II-12 intact. No motor deficits.  Lab Results:  Basic Metabolic Panel:  Recent Labs Lab 06/01/15 0528 06/02/15 0412 06/03/15 0458 06/04/15 0514 06/05/15 0620  NA 138 139 136 137 138  K 2.9* 3.7 3.2* 3.6 4.4  CL 107 110 108 108 107  CO2 23 20* 22 19* 21*  GLUCOSE 140* 142* 166* 170* 126*  BUN <5* <5* <5* <5* <5*  CREATININE 0.81 0.78 0.74 0.74 0.79  CALCIUM 8.5* 8.4* 8.2* 8.3* 6.7*  MG 1.7 1.6*  --  1.6*  --    Liver Function Tests:  Recent Labs Lab 06/03/15 0458  AST 12*  ALT 12*  ALKPHOS 72  BILITOT 0.4  PROT 5.3*  ALBUMIN 2.4*   CBC:  Recent Labs Lab 06/01/15 0528 06/02/15 0412 06/03/15 0458 06/04/15 0653 06/05/15 0620  WBC 7.1 7.6 7.9 9.1 10.3  HGB 9.2* 10.5* 9.5* 10.1* 9.8*  HCT 29.8* 32.7* 30.4* 32.5* 30.7*  MCV 89.2 87.7 88.1 88.3 86.7  PLT 281 239 268 293 272   CBG:  Recent Labs Lab 06/04/15 1649 06/04/15 1918 06/04/15 2302 06/05/15 0411 06/05/15 0739  GLUCAP 133* 136* 158* 132* 126*    Recent Results (from the past 240 hour(s))  C difficile quick scan w PCR reflex     Status: None  Collection Time: 05/28/15 10:35 AM  Result Value Ref Range Status   C Diff antigen NEGATIVE NEGATIVE Final   C Diff toxin NEGATIVE NEGATIVE Final   C Diff interpretation Negative for toxigenic C. difficile  Final      Studies/Results: Dg Chest Port 1 View  06/04/2015  CLINICAL DATA:  Shortness of breath. EXAM: PORTABLE CHEST 1 VIEW COMPARISON:  06/27/2014 . FINDINGS: New NG tube noted with tip below left hemidiaphragm. Cardiomegaly with mild pulmonary vascular prominence and interstitial prominence. Low lung volumes. No pleural effusion or pneumothorax. IMPRESSION: 1. NG tube noted with tip below left hemidiaphragm. 2. Cardiomegaly with mild pulmonary vascular prominence and  interstitial prominence suggesting mild congestive heart failure . Electronically Signed   By: Marcello Moores  Register   On: 06/04/2015 09:02    Medications:  Scheduled: . ciprofloxacin  400 mg Intravenous Q12H  . enoxaparin (LOVENOX) injection  60 mg Subcutaneous Q24H  . furosemide  40 mg Intravenous Daily  . [START ON 06/06/2015] furosemide  40 mg Oral Daily  . insulin aspart  0-9 Units Subcutaneous Q4H  . loratadine  10 mg Oral Daily  . metoCLOPramide (REGLAN) injection  5 mg Intravenous 4 times per day  . metoprolol  2.5 mg Intravenous 4 times per day  . metronidazole  500 mg Intravenous Q8H  . potassium chloride  20 mEq Oral Once   Continuous: . dextrose 5 % and 0.45 % NaCl with KCl 20 mEq/L 10 mL/hr at 06/04/15 0913   [id number] **OR** acetaminophen, diphenhydrAMINE, hydrALAZINE, HYDROmorphone (DILAUDID) injection, LORazepam, [DISCONTINUED] ondansetron **OR** ondansetron (ZOFRAN) IV, phenol, promethazine  Assessment/Plan:  Principal Problem:   Sigmoid diverticulitis Active Problems:   Hypertension   Diverticulitis   Hypokalemia   Diabetes mellitus type 2, controlled (HCC)   Chronic anemia   Diverticulitis of large intestine without perforation or abscess without bleeding   Acute on chronic diastolic (congestive) heart failure ([place])    Acute Sigmoid diverticulitis Patient's abdomen remains benign. Her pain is much better. Nausea is most likely related to gastroparesis as it appears that it has improved significantly with Reglan. Discussed with Dr. Penelope Coop with gastroenterology. Continue intravenous Cipro and Flagyl for now. Advance diet. If she remains stable and continues to show improvement. Antibiotics could be changed over to oral tomorrow. Initial CT abdomen showed persistent or recurrent diverticulitis in the sigmoid colon, slightly increased in the degree of inflammation from the prior examination. CT was repeated. No abscess or perforation. Abdominal films suggested a  distal colonic obstruction which was not seen on the repeat CT. C. difficile negative.  Gastroparesis most likely secondary to diabetes Patient was having significant nausea yesterday. This was thought to be unrelated to her diverticulitis. Discussed with gastroenterology yesterday and was started on Reglan intravenously. Her nausea has resolved. Apparently she was diagnosed with gastroparesis a few years ago. She does not take Reglan due to stool frequency. Change to oral Reglan tomorrow if she remains stable. Explained to the patient that he may have to send her out on oral Reglan at least for now.  Acute on chronic diastolic CHF, grade 2 diastolic dysfunction Patient's respiratory status has improved. She feels much better after receiving Lasix yesterday. She has diuresed . Weight is decreased. We will give her an additional dose this morning and then change her over to oral Lasix from tomorrow. Chest x-ray yesterday revealed evidence for interstitial edema. She likely had mild acute on chronic diastolic failure. Probably made worse due to fluids. She was noted to  be positive by more than 10 L during this hospital stay. 2-D echo in 04/2013 had shown EF of 55-60% with grade 2 diastolic dysfunction, follow I's and O's closely. Daily weights.  History of essential Hypertension Blood pressure is better controlled than before, but not optimum. This is due to an inability to take oral medications along with pain. Resume her metoprolol at a higher dose today.  Hypokalemia/hypomagnesemia Potassium level is better. Magnesium was repleted.   Diabetes mellitus type 2, controlled Continue sliding scale insulin. CBGs are reasonably well controlled.  Chronic anemia Hemoglobin is stable   History of Anxiety Patient had an episode of panic attack during this hospitalization. No further episodes. Resume Paxil. Ativan as needed for now.  Mildly dilated biliary ducts noted on CT scan LFTs and bilirubin  normal.  Pancreatic atrophy, along with possible cystic abnormality noted on CT scan Will need outpatient follow-up and repeat imaging studies in 1 year.  DVT Prophylaxis: Lovenox    Code Status: full code  Family Communication: discussed with the patient  Disposition Plan: Patient has improved today. Diet to be advanced. Continue to mobilize. Another dose of IV Lasix today. Anticipate change to oral antibiotics and oral Reglan tomorrow. If she does okay with this plan anticipate discharge on Thursday.    LOS: 8 days   [facility] Pager [phone] 06/05/2015, 9:23 AM  If 7PM-7AM, please contact night-coverage at [email or web address], password [facility]

## 2015-06-05 NOTE — Progress Notes (Signed)
Nutrition Follow-up  DOCUMENTATION CODES:   Morbid obesity  INTERVENTION:   -Continue with full liquid diet -RD will continue to follow for diet advancement, intake, and supplement det as appropriate  NUTRITION DIAGNOSIS:   Inadequate oral intake related to altered GI function as evidenced by meal completion < 25%.  Progressing  GOAL:   Patient will meet greater than or equal to 90% of their needs  Progressing  MONITOR:   PO intake, Diet advancement, Labs, Weight trends, Skin, I & O's  REASON FOR ASSESSMENT:   NPO/Clear Liquid Diet    ASSESSMENT:   Sharon [name] is a 69 y.o. female with history of diabetes mellitus type 2, hypertension, hyperlipidemia, OSA and diastolic CHF presents to the ER because of persistent nausea vomiting and diarrhea over the last 2 days with left lower quadrant abdominal pain. Patient's symptoms started about 1 week ago when patient had left lower quadrant pain and patient's gastroenterologist place patient on Augmentin. Patient states at that time patient's pain improved gradually and 3 days ago patient had eaten outside following which patient's symptoms started coming back with nausea vomiting and worsening abdominal pain mostly in the left lower quadrant. Patient has a persistent pain in the left lower quadrant at times has a colicky nature. Denies any blood in the vomitus or diarrhea. Patient has been unable to keep in anything last 2 days and has not taken her medications including insulin. CT abdomen and pelvis done in the ER shows recurrent sigmoid diverticulitis and patient has been admitted for further management. Denies any chest pain or shortness of breath. Labs also revealed hypokalemia.  Spoke with RN, who confirms that NGT was removed yesterday.   RN reports that pt "is almost like a different person" compared to yesterday. She reports pt has been given reglan, which has helped with pt's nausea greatly. She was just advanced to a full  liquid diet for lunch. RN reveals pt did not like the clear liquid diet and, as a result, ate only the jello off her tray this morning. RN anticipates that pt will eat more on full liquid diet, due to wider variety of food selections.   Labs reviewed: CBGS: 126-158 (trending down).  Diet Order:  Diet full liquid Room service appropriate?: Yes; Fluid consistency:: Thin  Skin:  Reviewed, no issues  Last BM:  06/05/15  Height:   Ht Readings from Last 1 Encounters:  05/28/15 4\' 11"  (1.499 m)    Weight:   Wt Readings from Last 1 Encounters:  06/05/15 274 lb 10.6 oz (124.585 kg)    Ideal Body Weight:  44.7 kg  BMI:  Body mass index is 55.45 kg/(m^2).  Estimated Nutritional Needs:   Kcal:  1600-1800  Protein:  75-90 grams  Fluid:  1.6-1.8 L  EDUCATION NEEDS:   No education needs identified at this time  Sharon [name] A. Jimmye Norman, RD, LDN, CDE Pager: [phone] After hours Pager: [phone]

## 2015-06-06 LAB — BASIC METABOLIC PANEL
Anion gap: 9 (ref 5–15)
CALCIUM: 8.4 mg/dL — AB (ref 8.9–10.3)
CO2: 21 mmol/L — ABNORMAL LOW (ref 22–32)
CREATININE: 0.76 mg/dL (ref 0.44–1.00)
Chloride: 108 mmol/L (ref 101–111)
GFR calc Af Amer: >60 mL/min (ref >60)
GLUCOSE: 148 mg/dL — AB (ref 65–99)
Potassium: 3.5 mmol/L (ref 3.5–5.1)
Sodium: 138 mmol/L (ref 135–145)

## 2015-06-06 LAB — GLUCOSE, CAPILLARY
GLUCOSE-CAPILLARY: 158 mg/dL — AB (ref 65–99)
GLUCOSE-CAPILLARY: 181 mg/dL — AB (ref 65–99)
GLUCOSE-CAPILLARY: 186 mg/dL — AB (ref 65–99)
Glucose-Capillary: 145 mg/dL — ABNORMAL HIGH (ref 65–99)
Glucose-Capillary: 137 mg/dL — ABNORMAL HIGH (ref 65–99)
Glucose-Capillary: 167 mg/dL — ABNORMAL HIGH (ref 65–99)

## 2015-06-06 LAB — CBC
HCT: 30 % — ABNORMAL LOW (ref 36.0–46.0)
Hemoglobin: 9.4 g/dL — ABNORMAL LOW (ref 12.0–15.0)
MCH: 27.7 pg (ref 26.0–34.0)
MCHC: 31.3 g/dL (ref 30.0–36.0)
MCV: 88.5 fL (ref 78.0–100.0)
PLATELETS: 327 10*3/uL (ref 150–400)
RBC: 3.39 MIL/uL — ABNORMAL LOW (ref 3.87–5.11)
RDW: 14.4 % (ref 11.5–15.5)
WBC: 10.5 10*3/uL (ref 4.0–10.5)

## 2015-06-06 LAB — MAGNESIUM: Magnesium: 1.5 mg/dL — ABNORMAL LOW (ref 1.7–2.4)

## 2015-06-06 MED ORDER — METOCLOPRAMIDE HCL 5 MG PO TABS
5.0000 mg | ORAL_TABLET | Freq: Three times a day (TID) | ORAL | Status: DC
  Administered 2015-06-06 – 2015-06-07 (×4): 5 mg via ORAL
  Filled 2015-06-06 (×4): qty 1

## 2015-06-06 MED ORDER — METRONIDAZOLE 500 MG PO TABS
500.0000 mg | ORAL_TABLET | Freq: Three times a day (TID) | ORAL | Status: DC
  Administered 2015-06-06 – 2015-06-07 (×4): 500 mg via ORAL
  Filled 2015-06-06 (×4): qty 1

## 2015-06-06 MED ORDER — CIPROFLOXACIN HCL 500 MG PO TABS
500.0000 mg | ORAL_TABLET | Freq: Two times a day (BID) | ORAL | Status: DC
  Administered 2015-06-06 – 2015-06-07 (×2): 500 mg via ORAL
  Filled 2015-06-06 (×2): qty 1

## 2015-06-06 NOTE — Progress Notes (Signed)
[facility] PROGRESS NOTE  Ashlei Saragosa [id number] DOB: 19-Dec-1945 DOA: 05/27/2015  PCP: Osborne Casco, MD  Brief HPI: 69yo Caucasian female with past medical history of type 2 diabetes, hypertension, hyperlipidemia, sleep apnea, presented with nausea, vomiting, diarrhea and abdominal pain. She underwent a CT scan which revealed sigmoid diverticulitis. She was admitted for further management. Patient was very slow to improve despite standard management. Repeat films suggested colonic obstruction. NG tube was placed. GI is following.  Past medical history:  Past Medical History  Diagnosis Date  . Diabetes mellitus   . Hypertension   . CHF (congestive heart failure) ([place])   . Asthma   . Osteoarthritis   . Anxiety   . GERD (gastroesophageal reflux disease)   . Kidney stones   . Hypercholesteremia   . Thrombophlebitis     Consultants: [facility]  Procedures: none  Antibiotics: On ciprofloxacin and Flagyl  Subjective: The patient feels much better today. She has no abdominal pain. She has no nausea or vomiting. She would like her diet advanced  Objective: Vital Signs  Filed Vitals:   06/05/15 1413 06/05/15 2123 06/05/15 2150 06/06/15 0427  BP: 145/56 174/69 146/57 158/64  Pulse: 75 91 80 73  Temp: 98.7 F (37.1 C) 99.3 F (37.4 C)  98.9 F (37.2 C)  TempSrc: Oral Oral  Oral  Resp: 18 18  18   Height:      Weight:    125.238 kg (276 lb 1.6 oz)  SpO2: 97% 94%  96%    Intake/Output Summary (Last 24 hours) at 06/06/15 1115 Last data filed at 06/06/15 0552  Gross per 24 hour  Intake   1262 ml  Output      0 ml  Net   1262 ml   Filed Weights   06/04/15 0432 06/05/15 0446 06/06/15 0427  Weight: 127.96 kg (282 lb 1.6 oz) 124.585 kg (274 lb 10.6 oz) 125.238 kg (276 lb 1.6 oz)    General appearance: alert, cooperative, appears stated age and no distress Resp: Improved air entry bilaterally. Cardio: regular rate and rhythm, S1, S2  normal, no murmur, click, rub or gallop GI: Abdomen continues to be soft. Nontender. Bowel sounds are present.  Neurologic: Alert and oriented 3. Cranial nerves II-12 intact. No motor deficits.  Lab Results:  Basic Metabolic Panel:  Recent Labs Lab 06/01/15 0528 06/02/15 0412 06/03/15 0458 06/04/15 0514 06/05/15 0620 06/06/15 0615  NA 138 139 136 137 138 138  K 2.9* 3.7 3.2* 3.6 4.4 3.5  CL 107 110 108 108 107 108  CO2 23 20* 22 19* 21* 21*  GLUCOSE 140* 142* 166* 170* 126* 148*  BUN <5* <5* <5* <5* <5* <5*  CREATININE 0.81 0.78 0.74 0.74 0.79 0.76  CALCIUM 8.5* 8.4* 8.2* 8.3* 6.7* 8.4*  MG 1.7 1.6*  --  1.6*  --  1.5*   Liver Function Tests:  Recent Labs Lab 06/03/15 0458  AST 12*  ALT 12*  ALKPHOS 72  BILITOT 0.4  PROT 5.3*  ALBUMIN 2.4*   CBC:  Recent Labs Lab 06/02/15 0412 06/03/15 0458 06/04/15 0653 06/05/15 0620 06/06/15 0615  WBC 7.6 7.9 9.1 10.3 10.5  HGB 10.5* 9.5* 10.1* 9.8* 9.4*  HCT 32.7* 30.4* 32.5* 30.7* 30.0*  MCV 87.7 88.1 88.3 86.7 88.5  PLT 239 268 293 272 327   CBG:  Recent Labs Lab 06/05/15 1544 06/05/15 1937 06/05/15 2313 06/06/15 0424 06/06/15 0740  GLUCAP 156* 169* 151* 145* 137*    Recent Results (  from the past 240 hour(s))  C difficile quick scan w PCR reflex     Status: None   Collection Time: 05/28/15 10:35 AM  Result Value Ref Range Status   C Diff antigen NEGATIVE NEGATIVE Final   C Diff toxin NEGATIVE NEGATIVE Final   C Diff interpretation Negative for toxigenic C. difficile  Final      Studies/Results: No results found.  Medications:  Scheduled: . ciprofloxacin  500 mg Oral BID  . enoxaparin (LOVENOX) injection  60 mg Subcutaneous Q24H  . furosemide  40 mg Oral Daily  . insulin aspart  0-9 Units Subcutaneous Q4H  . loratadine  10 mg Oral Daily  . metoCLOPramide (REGLAN) injection  5 mg Intravenous 4 times per day  . metoprolol succinate  50 mg Oral Daily  . metroNIDAZOLE  500 mg Oral 3 times per day   . PARoxetine  60 mg Oral Daily   Continuous: . dextrose 5 % and 0.45 % NaCl with KCl 20 mEq/L 10 mL/hr at 06/04/15 0913   [id number] **OR** acetaminophen, diphenhydrAMINE, hydrALAZINE, HYDROmorphone (DILAUDID) injection, LORazepam, [DISCONTINUED] ondansetron **OR** ondansetron (ZOFRAN) IV, phenol, promethazine  Assessment/Plan:  Principal Problem:   Sigmoid diverticulitis Active Problems:   Hypertension   Diverticulitis   Hypokalemia   Diabetes mellitus type 2, controlled (HCC)   Chronic anemia   Diverticulitis of large intestine without perforation or abscess without bleeding   Acute on chronic diastolic (congestive) heart failure ([place])    Acute Sigmoid diverticulitis Patient's abdomen remains benign. Her pain is much better. Nausea is most likely related to gastroparesis as it appears that it has improved significantly with Reglan. Discussed with Dr. Penelope Coop with gastroenterology. Switch to PO Cipro and Flagyl . Advance diet. If she remains stable and continues to show improvement.dc home  tomorrow. Initial CT abdomen showed persistent or recurrent diverticulitis in the sigmoid colon, slightly increased in the degree of inflammation from the prior examination. CT was repeated. No abscess or perforation. Abdominal films suggested a distal colonic obstruction which was not seen on the repeat CT. C. difficile negative.  Gastroparesis most likely secondary to diabetes Patient was having significant nausea yesterday. This was thought to be unrelated to her diverticulitis. Discussed with gastroenterology yesterday and was started on Reglan intravenously. Her nausea has resolved. Apparently she was diagnosed with gastroparesis a few years ago. She does not take Reglan due to stool frequency. Cont  Reglan, switch to PO   .    Acute on chronic diastolic CHF, grade 2 diastolic dysfunction Patient's respiratory status has improved. She feels much better after receiving Lasix  . She has  diuresed . Cont oral Lasix  . Chest x-ray yesterday revealed evidence for interstitial edema. She likely had mild acute on chronic diastolic failure. Probably made worse due to fluids. She was noted to be positive by more than 10 L during this hospital stay. 2-D echo in 04/2013 had shown EF of 55-60% with grade 2 diastolic dysfunction, follow I's and O's closely. Daily weights.  History of essential Hypertension Blood pressure is better controlled than before, but not optimum. This is due to an inability to take oral medications along with pain. Resume her metoprolol at a higher dose today.  Hypokalemia/hypomagnesemia Potassium level is better. Magnesium was repleted.   Diabetes mellitus type 2, controlled Continue sliding scale insulin. CBGs are reasonably well controlled.  Chronic anemia Hemoglobin is stable around 9.5   History of Anxiety Patient had an episode of panic attack during this hospitalization.  No further episodes. Resume Paxil. Ativan as needed for now.  Mildly dilated biliary ducts noted on CT scan LFTs and bilirubin normal.  Pancreatic atrophy, along with possible cystic abnormality noted on CT scan Will need outpatient follow-up and repeat imaging studies in 1 year.   DVT Prophylaxis: Lovenox    Code Status: full code  Family Communication: discussed with the patient  Disposition Plan: Patient has improved today. Diet to be advanced.  Anticipate Dc tomorrow.    LOS: 9 days   [facility]  [facility] Pager [phone] 06/06/2015, 11:15 AM  If 7PM-7AM, please contact night-coverage at [email or web address], password [facility]

## 2015-06-06 NOTE — Care Management Note (Signed)
Case Management Note  Patient Details  Name: Sharon [name] MRN: [id number] Date of Birth: July 20, 1945  Subjective/Objective:                    Action/Plan:  Possible discharge to home tomorrow , advance diet today . UR updated  Expected Discharge Date:                  Expected Discharge Plan:  Home/Self Care  In-House Referral:     Discharge planning Services     Post Acute Care Choice:    Choice offered to:     DME Arranged:    DME Agency:     HH Arranged:    HH Agency:     Status of Service:  In process, will continue to follow  Medicare Important Message Given:    Date Medicare IM Given:    Medicare IM give by:    Date Additional Medicare IM Given:    Additional Medicare Important Message give by:     If discussed at [place] of Stay Meetings, dates discussed:    Additional Comments:  Marilu Favre, RN 06/06/2015, 12:25 PM

## 2015-06-06 NOTE — Progress Notes (Signed)
The patient feels much better today. She has no abdominal pain. She has no nausea or vomiting. She would like her diet advanced. I will go ahead and advance her diet. If she does well she can probably go home tomorrow. Call us if needed.

## 2015-06-06 NOTE — Progress Notes (Signed)
Physical Therapy Treatment Patient Details Name: Sharon [name] MRN: [id number] DOB: [birth year] Today's Date: 06/06/2015    History of Present Illness 69 y.o. female admitted to [facility] on 05/27/15 for abdominal pain, N/V.  Pt dx with acute sigmoid diverticulitis.  NG tube taken out 06/04/15. Pt with significant PMHx of DM, HTN, CHF, asthma, and anxiety.    PT Comments    Pt was in good spirits upon entering room. Pt stated she was finally able to eat food and she feel much better; pt hoping to discharge home tomorrow.   Follow Up Recommendations  No PT follow up     Equipment Recommendations  None recommended by PT       Precautions / Restrictions Precautions Precautions: Fall Restrictions Weight Bearing Restrictions: No    Mobility                 Transfers Overall transfer level: Needs assistance Equipment used: Rolling walker (2 wheeled) Transfers: Sit to/from Stand Sit to Stand: Supervision         General transfer comment: Pt required supervision for transfers to maintain pt safety  Ambulation/Gait Ambulation/Gait assistance: Supervision Ambulation Distance (Feet): 75 Feet Assistive device: Rolling walker (2 wheeled) Gait Pattern/deviations: Step-through pattern;Trunk flexed Gait velocity: decreased Gait velocity interpretation: <1.8 ft/sec, indicative of risk for recurrent falls General Gait Details: Pt was able to walk ~75 feet requiring supervision for safety. Pt looked much more steady and safe on her feet today. Pt did feel slightly whoozy at the end of gait training and needed to ride back to room in recliner                 Cognition Arousal/Alertness: Awake/alert Behavior During Therapy: [facility] for tasks assessed/performed Overall Cognitive Status: Within Functional Limits for tasks assessed                      Exercises General Exercises - Lower Extremity Ankle Circles/Pumps: AROM;Both;20 reps;Seated Quad Sets: AROM;Both;10  reps;Seated (hold for 5) Heel Slides: AROM;Both;10 reps;Seated        Pertinent Vitals/Pain Pain Assessment: No/denies pain           PT Goals (current goals can now be found in the care plan section) Progress towards PT goals: Progressing toward goals    Frequency  Min 3X/week    PT Plan Current plan remains appropriate       End of Session   Activity Tolerance: Patient tolerated treatment well Patient left: in chair;with call bell/phone within reach     Time: 1415-1437 PT Time Calculation (min) (ACUTE ONLY): 22 min  Charges:    1 Gait                    [name], [place] OFFICE  06/06/2015, 3:04 PM

## 2015-06-07 LAB — GLUCOSE, CAPILLARY
GLUCOSE-CAPILLARY: 157 mg/dL — AB (ref 65–99)
GLUCOSE-CAPILLARY: 143 mg/dL — AB (ref 65–99)
Glucose-Capillary: 184 mg/dL — ABNORMAL HIGH (ref 65–99)

## 2015-06-07 MED ORDER — CIPROFLOXACIN HCL 500 MG PO TABS: 500.0000 mg | ORAL_TABLET | Freq: Two times a day (BID) | ORAL | Status: DC

## 2015-06-07 MED ORDER — LISINOPRIL-HYDROCHLOROTHIAZIDE 20-12.5 MG PO TABS: 2.0000 | ORAL_TABLET | Freq: Every day | ORAL | Status: DC

## 2015-06-07 MED ORDER — METRONIDAZOLE 500 MG PO TABS: 500.0000 mg | ORAL_TABLET | Freq: Three times a day (TID) | ORAL | Status: DC

## 2015-06-07 MED ORDER — METOCLOPRAMIDE HCL 5 MG PO TABS: 5.0000 mg | ORAL_TABLET | Freq: Three times a day (TID) | ORAL | Status: DC

## 2015-06-07 MED ORDER — FUROSEMIDE 40 MG PO TABS: 40.0000 mg | ORAL_TABLET | Freq: Every day | ORAL | Status: DC

## 2015-06-07 NOTE — Discharge Summary (Signed)
Physician Discharge Summary  Sharon [name] MRN: [id number] DOB/AGE: 09-27-1945 69 y.o.  PCP: Osborne Casco, MD   Admit date: 05/27/2015 Discharge date: 06/07/2015  Discharge Diagnoses:     Principal Problem:   Sigmoid diverticulitis Active Problems:   Hypertension   Diverticulitis   Hypokalemia   Diabetes mellitus type 2, controlled ([place])   Chronic anemia   Diverticulitis of large intestine without perforation or abscess without bleeding   Acute on chronic diastolic (congestive) heart failure ([place])    Follow-up recommendations Follow-up with PCP in 3-5 days , including all  additional recommended appointments as below Follow-up CBC, CMP in 3-5 days Patient of all of her taking her lisinopril/HCTZ until next week after she has recovered from her acute diverticulitis     Medication List    STOP taking these medications        ibuprofen 200 MG tablet  Commonly known as:  ADVIL,MOTRIN      TAKE these medications        aspirin EC 81 MG tablet  Take 81 mg by mouth daily.     beta carotene w/minerals tablet  Take 1 tablet by mouth daily.     CALTRATE 600+D 600-400 MG-UNIT tablet  Generic drug:  Calcium Carbonate-Vitamin D  Take 1 tablet by mouth 2 (two) times daily.     ciprofloxacin 500 MG tablet  Commonly known as:  CIPRO  Take 1 tablet (500 mg total) by mouth 2 (two) times daily.     DEXILANT 60 MG capsule  Generic drug:  dexlansoprazole  Take 60 mg by mouth daily.     Dextromethorphan-Guaifenesin 60-1200 MG 12hr tablet  Take 1 tablet by mouth 2 (two) times daily as needed (CONGESTION).     fish oil-omega-3 fatty acids 1000 MG capsule  Take 1 g by mouth daily.     furosemide 40 MG tablet  Commonly known as:  LASIX  Take 1 tablet (40 mg total) by mouth daily.     gabapentin 300 MG capsule  Commonly known as:  NEURONTIN  Take 600 mg by mouth 2 (two) times daily.     hydrocortisone cream 0.5 %  Apply 1 application topically 2 (two) times  daily as needed for itching.     lisinopril-hydrochlorothiazide 20-12.5 MG tablet  Commonly known as:  PRINZIDE,ZESTORETIC  Take 2 tablets by mouth daily.  Start taking on:  105/23/202016     loratadine 10 MG tablet  Commonly known as:  CLARITIN  Take 10 mg by mouth daily.     meloxicam 15 MG tablet  Commonly known as:  MOBIC  Take 15 mg by mouth daily.     metoCLOPramide 5 MG tablet  Commonly known as:  REGLAN  Take 1 tablet (5 mg total) by mouth 3 (three) times daily before meals.     metoprolol succinate 25 MG 24 hr tablet  Commonly known as:  TOPROL XL  Take 1 tablet (25 mg total) by mouth daily.     metroNIDAZOLE 500 MG tablet  Commonly known as:  FLAGYL  Take 1 tablet (500 mg total) by mouth every 8 (eight) hours.     ondansetron 4 MG tablet  Commonly known as:  ZOFRAN  Take 4-8 mg by mouth every 8 (eight) hours as needed for nausea or vomiting. 4-24m prn     PARoxetine 40 MG tablet  Commonly known as:  PAXIL  Take 60 mg by mouth every morning.     polyethylene glycol packet  Commonly known  as:  MIRALAX / GLYCOLAX  Take 17 g by mouth daily as needed for mild constipation.     simvastatin 40 MG tablet  Commonly known as:  ZOCOR  Take 40 mg by mouth every morning.     sitaGLIPtin 50 MG tablet  Commonly known as:  JANUVIA  Take 50 mg by mouth daily.     TRESIBA FLEXTOUCH [place]  Inject 120 Units into the skin daily.         Discharge Condition: *Stable   Discharge Instructions       Discharge Instructions    Diet - low sodium heart healthy    Complete by:  As directed      Increase activity slowly    Complete by:  As directed            Allergies  Allergen Reactions  . Crestor [Rosuvastatin] Other (See Comments)    Myalgias  . Indomethacin Other (See Comments)    dizziness  . Invanz [Ertapenem Sodium] Hives  . Lipitor [Atorvastatin Calcium] Other (See Comments)    Myalgias       Disposition: 01-Home or Self  Care   Consults:   Significant Diagnostic Studies:  Dg Abd 1 View  05/31/2015  CLINICAL DATA:  Left lower quadrant pain, constipation EXAM: ABDOMEN - 1 VIEW COMPARISON:  Abdominal radiographs dated 06/11/2015. CT abdomen pelvis dated 05/27/2015. FINDINGS: Enteric tube terminates in the distal gastric body. Nonobstructive bowel gas pattern. Residual contrast in the colon. IMPRESSION: Enteric tube terminates in the distal gastric body. Otherwise unremarkable abdominal radiograph. Electronically Signed   By: Julian Hy M.D.   On: 05/31/2015 13:22   Ct Abdomen Pelvis W Contrast  06/02/2015  CLINICAL DATA:  Diverticulitis with abdominal pain. EXAM: CT ABDOMEN AND PELVIS WITH CONTRAST TECHNIQUE: Multidetector CT imaging of the abdomen and pelvis was performed using the standard protocol following bolus administration of intravenous contrast. CONTRAST:  166m OMNIPAQUE IOHEXOL 300 MG/ML  SOLN COMPARISON:  05/27/2015 FINDINGS: Lower chest: Motion degradation at the bases. Grossly clear. Mild cardiomegaly. Small bilateral pleural effusions are new. Hepatobiliary: Normal liver. Cholecystectomy. Intrahepatic ducts upper normal. Mild common duct dilatation at 13 mm. Pancreas: Mild pancreatic atrophy. Suspect ductectasia within the pancreatic uncinate process (image 34, series 2). An underlying cystic lesion of 1.5 cm is possible. This has been present over prior exams. No main duct dilatation. Spleen: Normal in size, without focal abnormality. Adrenals/Urinary Tract: Right adrenal nodule is unchanged in size and was consistent with an adenoma on the initial exam. Low normal left adrenal. Too small to characterize lesions in the left kidney. A lower pole right renal lesion is likely a cyst. No hydronephrosis. Normal urinary bladder. Stomach/Bowel: Nasogastric tube terminating at the body of the stomach. Wall thickening involving the sigmoid with mild surrounding edema. The severity is similar, but the length  of colonic inflammation is slightly increased, involving the majority of the sigmoid today. No extraluminal free gas or pericolonic abscess. Fluid throughout the colon suggests a diarrheal state. Normal terminal ileum. Normal small bowel. Vascular/Lymphatic: Normal caliber of the aorta and branch vessels. No abdominopelvic adenopathy. Reproductive: Hysterectomy.  No adnexal mass. Other: Anasarca. Musculoskeletal: Degenerative disc disease at L4-5 with prominent disc bulge. IMPRESSION: 1. Uncomplicated persistent or recurrent diverticulitis. This extends over a longer segment of the sigmoid today, without evidence of free perforation or complicating abscess. 2. Cholecystectomy with mild biliary duct dilatation. Correlate with bilirubin levels. If these are normal, this is likely within normal variation. 3. Pancreatic  atrophy. Suggestion of side branch duct ectasia versus a cystic lesion in the pancreatic uncinate process. Consider follow-up with MRI/ MRCP at 1 year for further characterization and to confirm stability. 4. Right adrenal adenoma. Electronically Signed   By: Abigail Miyamoto M.D.   On: 06/02/2015 18:14   Ct Abdomen Pelvis W Contrast  05/27/2015  CLINICAL DATA:  Vomiting, nausea, and diarrhea. Recent diagnosis of diverticulitis post 7 days of antibiotics. Diverticulitis versus developed abscess. EXAM: CT ABDOMEN AND PELVIS WITH CONTRAST TECHNIQUE: Multidetector CT imaging of the abdomen and pelvis was performed using the standard protocol following bolus administration of intravenous contrast. CONTRAST:  173m OMNIPAQUE IOHEXOL 300 MG/ML  SOLN COMPARISON:  CT 05/17/2015 FINDINGS: Lower chest: Motion artifact through the lung bases. No pleural effusion or consolidation. Liver: No focal hepatic lesion. Hepatobiliary: Postcholecystectomy with stable intra and extrahepatic biliary prominence. Pancreas: Mild fatty atrophy of the head and body. No ductal dilatation or surrounding inflammation. Spleen: Normal.  Adrenal glands: 1.5 cm right adrenal adenoma unchanged. Left adrenal gland is normal. Kidneys: Symmetric renal enhancement. No hydronephrosis. Exophytic cyst from the upper right kidney measures 4.9 cm. Additional smaller cortical cysts are seen in both kidneys, unchanged. Stomach/Bowel: Stomach physiologically distended. There are no dilated or thickened small bowel loops. Persistent inflammatory change in involving the sigmoid colon consistent with diverticulitis, with slight progression in degree of pericolonic fat stranding. There is no perforation or abscess. Moderate volume of stool throughout the more proximal colon without proximal colonic wall thickening. The appendix is surgically absent. Vascular/Lymphatic: No retroperitoneal adenopathy. The borderline celiac axis lymph node measuring 8 mm is unchanged. Abdominal aorta is normal in caliber. Mild atherosclerosis without aneurysm. Reproductive: Uterus surgically absent.  No adnexal mass. Bladder: Physiologically distended, no wall thickening. Other: No free air, free fluid, or intra-abdominal fluid collection. Fat containing umbilical hernia with mild surrounding edema in the anterior abdominal wall, appears chronic. Musculoskeletal: There are no acute or suspicious osseous abnormalities. Stable degenerative change in the spine. Hemi transitional lumbosacral anatomy. IMPRESSION: 1. Persistent or recurrent diverticulitis in the sigmoid colon, slightly increased in degree of inflammation from prior exam. There is however no perforation, abscess or complication. 2. Additional chronic findings are stable. Electronically Signed   By: MJeb LeveringM.D.   On: 05/27/2015 23:35   Ct Abdomen Pelvis W Contrast  05/17/2015  CLINICAL DATA:  69year old female with history of left upper quadrant abdominal pain for the past 7-8 weeks. Gas and bloating. Nausea. History of prior hysterectomy, cholecystectomy, appendectomy and oophorectomy. EXAM: CT ABDOMEN AND PELVIS  WITH CONTRAST TECHNIQUE: Multidetector CT imaging of the abdomen and pelvis was performed using the standard protocol following bolus administration of intravenous contrast. CONTRAST:  1262mISOVUE-300 IOPAMIDOL (ISOVUE-300) INJECTION 61% COMPARISON:  No priors. FINDINGS: Lower chest: Mild cardiomegaly. Atherosclerotic calcifications in the right coronary artery. Calcifications of the aortic valve. Hepatobiliary: No cystic or solid hepatic lesions. Minimal intrahepatic biliary ductal dilatation. Common bile duct measures 9 mm in the porta hepatis. Status post cholecystectomy. Pancreas: No pancreatic mass. No pancreatic ductal dilatation. No pancreatic or peripancreatic fluid or inflammatory changes. Spleen: Unremarkable. Adrenals/Urinary Tract: 1.5 cm right adrenal nodule demonstrates significant washout of contrast material (approximately 70%), compatible with an adenoma. Left adrenal gland is normal in appearance. Sub cm low-attenuation lesions in the kidneys bilaterally are too small to definitively characterize, but are favored to represent cysts. Exophytic ovoid shaped 4.3 cm low-attenuation lesion in the upper pole of the right kidney is compatible with a simple  cyst. No hydroureteronephrosis. Urinary bladder is normal in appearance. Stomach/Bowel: Normal appearance of the stomach. No pathologic dilatation of small bowel or colon. There are few colonic diverticulae, and associated with a diverticulum in the mid sigmoid colon there are some subtle adjacent inflammatory changes, suggesting mild acute diverticulitis. No diverticular abscess or signs of frank perforation are identified at this time. Status post appendectomy. Vascular/Lymphatic: Atherosclerosis throughout the abdominal and pelvic vasculature, without evidence of aneurysm or dissection. No lymphadenopathy noted in the abdomen or pelvis. Borderline enlarged 8 mm short axis celiac axis lymph node is nonspecific. Reproductive: Status post hysterectomy.  Status post total abdominal hysterectomy and bilateral salpingo oophorectomy. Other: No significant volume of ascites. No pneumoperitoneum. Tiny umbilical hernia containing only omental fat incidentally noted. Musculoskeletal: There are no aggressive appearing lytic or blastic lesions noted in the visualized portions of the skeleton. IMPRESSION: 1. Findings, as above, concerning for very mild acute diverticulitis in the mid sigmoid colon. No signs of diverticular abscess or evidence of frank perforation at this time. 2. No other acute findings noted in the abdomen or pelvis. 3. Status post cholecystectomy. Mild intra and extrahepatic biliary ductal dilatation is favored to reflect benign post cholecystectomy physiology, but correlation with liver function tests may be appropriate. 4. 1.5 cm right adrenal adenoma incidentally noted. 5. Small umbilical hernia containing only omental fat. No signs of associated bowel incarceration or obstruction at this time. 6. Atherosclerosis. 7. Additional incidental findings, as above. Electronically Signed   By: Vinnie Langton M.D.   On: 05/17/2015 09:13   Dg Chest Port 1 View  06/04/2015  CLINICAL DATA:  Shortness of breath. EXAM: PORTABLE CHEST 1 VIEW COMPARISON:  06/27/2014 . FINDINGS: New NG tube noted with tip below left hemidiaphragm. Cardiomegaly with mild pulmonary vascular prominence and interstitial prominence. Low lung volumes. No pleural effusion or pneumothorax. IMPRESSION: 1. NG tube noted with tip below left hemidiaphragm. 2. Cardiomegaly with mild pulmonary vascular prominence and interstitial prominence suggesting mild congestive heart failure . Electronically Signed   By: Marcello Moores  Register   On: 06/04/2015 09:02   Dg Abd 2 Views  05/29/2015  CLINICAL DATA:  Vomiting and diffuse abdominal tenderness. Sigmoid colon wall thickening on recent CT. EXAM: ABDOMEN - 2 VIEW COMPARISON:  05/27/2015 CT abdomen/ pelvis. FINDINGS: No disproportionately dilated small  bowel loops or air-fluid levels. There is a large amount of stool and retained oral contrast throughout the colon. No evidence of pneumatosis or pneumoperitoneum. No pathologic soft tissue calcifications. Moderate degenerative changes throughout the visualized thoracolumbar spine. IMPRESSION: 1. No evidence of small-bowel obstruction. 2. Large amount of stool and retained oral contrast throughout the colon. Findings could indicate a partial distal colonic obstruction at the level of the sigmoid colon wall thickening seen on the 05/27/2015 CT study. Please note that the sigmoid colon wall thickening seen on the 05/27/2015 CT study is nonspecific and a neoplastic etiology cannot be excluded. Correlation with colonoscopy should be considered. Electronically Signed   By: Ilona Sorrel M.D.   On: 05/29/2015 20:25        Filed Weights   06/05/15 0446 06/06/15 0427 06/06/15 2145  Weight: 124.585 kg (274 lb 10.6 oz) 125.238 kg (276 lb 1.6 oz) 124.15 kg (273 lb 11.2 oz)     Microbiology: No results found for this or any previous visit (from the past 240 hour(s)).     Blood Culture    Component Value Date/Time   SDES BLOOD ARM RIGHT UPPER 01/07/2011 1810   SPECREQUEST  BOTTLES DRAWN AEROBIC AND ANAEROBIC 10CC 01/07/2011 1810   CULT NO GROWTH 5 DAYS 01/07/2011 1810   REPTSTATUS 01/14/2011 FINAL 01/07/2011 1810      Labs: Results for orders placed or performed during the hospital encounter of 05/27/15 (from the past 48 hour(s))  Glucose, capillary     Status: Abnormal   Collection Time: 06/05/15  3:44 PM  Result Value Ref Range   Glucose-Capillary 156 (H) 65 - 99 mg/dL   Comment 1 Notify RN   Glucose, capillary     Status: Abnormal   Collection Time: 06/05/15  7:37 PM  Result Value Ref Range   Glucose-Capillary 169 (H) 65 - 99 mg/dL   Comment 1 Notify RN   Glucose, capillary     Status: Abnormal   Collection Time: 06/05/15 11:13 PM  Result Value Ref Range   Glucose-Capillary 151 (H) 65 -  99 mg/dL   Comment 1 Notify RN   Glucose, capillary     Status: Abnormal   Collection Time: 06/06/15  4:24 AM  Result Value Ref Range   Glucose-Capillary 145 (H) 65 - 99 mg/dL   Comment 1 Notify RN   CBC     Status: Abnormal   Collection Time: 06/06/15  6:15 AM  Result Value Ref Range   WBC 10.5 4.0 - 10.5 K/uL   RBC 3.39 (L) 3.87 - 5.11 MIL/uL   Hemoglobin 9.4 (L) 12.0 - 15.0 g/dL   HCT 30.0 (L) 36.0 - 46.0 %   MCV 88.5 78.0 - 100.0 fL   MCH 27.7 26.0 - 34.0 pg   MCHC 31.3 30.0 - 36.0 g/dL   RDW 14.4 11.5 - 15.5 %   Platelets 327 150 - 400 K/uL  Basic metabolic panel     Status: Abnormal   Collection Time: 06/06/15  6:15 AM  Result Value Ref Range   Sodium 138 135 - 145 mmol/L   Potassium 3.5 3.5 - 5.1 mmol/L   Chloride 108 101 - 111 mmol/L   CO2 21 (L) 22 - 32 mmol/L   Glucose, Bld 148 (H) 65 - 99 mg/dL   BUN <5 (L) 6 - 20 mg/dL   Creatinine, Ser 0.76 0.44 - 1.00 mg/dL   Calcium 8.4 (L) 8.9 - 10.3 mg/dL   GFR calc non Af Amer >60 >60 mL/min   GFR calc Af Amer >60 >60 mL/min    Comment: (NOTE) The eGFR has been calculated using the CKD EPI equation. This calculation has not been validated in all clinical situations. eGFR's persistently <60 mL/min signify possible Chronic Kidney Disease.    Anion gap 9 5 - 15  Magnesium     Status: Abnormal   Collection Time: 06/06/15  6:15 AM  Result Value Ref Range   Magnesium 1.5 (L) 1.7 - 2.4 mg/dL  Glucose, capillary     Status: Abnormal   Collection Time: 06/06/15  7:40 AM  Result Value Ref Range   Glucose-Capillary 137 (H) 65 - 99 mg/dL  Glucose, capillary     Status: Abnormal   Collection Time: 06/06/15  1:11 PM  Result Value Ref Range   Glucose-Capillary 158 (H) 65 - 99 mg/dL  Glucose, capillary     Status: Abnormal   Collection Time: 06/06/15  4:54 PM  Result Value Ref Range   Glucose-Capillary 181 (H) 65 - 99 mg/dL  Glucose, capillary     Status: Abnormal   Collection Time: 06/06/15  8:02 PM  Result Value Ref Range    Glucose-Capillary 186 (  H) 65 - 99 mg/dL  Glucose, capillary     Status: Abnormal   Collection Time: 06/06/15 11:19 PM  Result Value Ref Range   Glucose-Capillary 167 (H) 65 - 99 mg/dL  Glucose, capillary     Status: Abnormal   Collection Time: 06/07/15  3:34 AM  Result Value Ref Range   Glucose-Capillary 157 (H) 65 - 99 mg/dL  Glucose, capillary     Status: Abnormal   Collection Time: 06/07/15  8:02 AM  Result Value Ref Range   Glucose-Capillary 143 (H) 65 - 99 mg/dL     Lipid Panel  No results found for: CHOL, TRIG, HDL, CHOLHDL, VLDL, LDLCALC, LDLDIRECT   Lab Results  Component Value Date   HGBA1C 7.2* 06/27/2014   HGBA1C 8.6* 01/01/2011     Lab Results  Component Value Date   CREATININE 0.76 06/06/2015     HPI :Left lower quadrant abdominal pain nausea and vomiting HPI: Malaijah Houchen is an 69 y.o. white female who presents with relapse of left lower quadrant pain and tenderness coming by nausea and vomiting 6 days after being diagnosed with diverticulitis by clinical presentation and CT scan and having been started on Augmentin. She had a low potassium at 2.7 a borderline elevated white blood cell count and no fever upon presentation but had not taken any for medicines in 3 days including insulin . Repeat CT scan showed slight worsening of sigmoid diverticulitis without any abscess or air in the bowel wall. She was started on IV Cipro and [place]:   Acute Sigmoid diverticulitis Patient's abdomen remains benign. Her pain is much better. Nausea is most likely related to gastroparesis as it appears that it has improved significantly with Reglan which the patient will continue after discharge. Discussed with Dr. Penelope Coop with gastroenterology. Patient to continue with Cipro and Flagyl for another 8 days .  Initial CT abdomen showed persistent or recurrent diverticulitis in the sigmoid colon, slightly increased in the degree of inflammation from the prior  examination. CT was repeated. No abscess or perforation. Abdominal films suggested a distal colonic obstruction which was not seen on the repeat CT. C. difficile negative.  Gastroparesis most likely secondary to diabetes Patient was having significant nausea during this admission. This was thought to be unrelated to her diverticulitis. Discussed with gastroenterology yesterday and was started on Reglan  . Her nausea has resolved. Apparently she was diagnosed with gastroparesis a few years ago. She does not take Reglan due to stool frequency. Cont Reglan, in the outpatient setting.    Acute on chronic diastolic CHF, grade 2 diastolic dysfunction Patient's respiratory status has improved. She feels much better after receiving Lasix . She has diuresed . Cont oral Lasixdose reduced to 40 mg once a day in the setting of acute diverticulitis . Chest x-ray yesterday revealed evidence for interstitial edema. She likely had mild acute on chronic diastolic failure. Probably made worse due to fluids. She was noted to be positive by more than 10 L during this hospital stay. 2-D echo in 04/2013 had shown EF of 55-60% with grade 2 diastolic dysfunction,  And can resume Lasix 40 mg twice a day next week  History of essential Hypertension Blood pressure is better controlled than before, but not optimum. This is due to an inability to take oral medications along with pain. Cont  Metoprolol, resume lisinopril/HCTZ   Hypokalemia/hypomagnesemia Potassium level is better. Magnesium was repleted.   Diabetes mellitus type 2, controlled Continue sliding scale insulin.  CBGs are reasonably well controlled.  Chronic anemia Hemoglobin is stable around 9.5   History of Anxiety Patient had an episode of panic attack during this hospitalization. No further episodes. Resume Paxil. Ativan as needed for now.  Mildly dilated biliary ducts noted on CT scan LFTs and bilirubin normal.  Pancreatic atrophy, along with  possible cystic abnormality noted on CT scan Will need outpatient follow-up and repeat imaging studies in 1 year.    Discharge Exam:    Blood pressure 171/69, pulse 71, temperature 98.7 F (37.1 C), temperature source Oral, resp. rate 19, height '4\' 11"'  (1.499 m), weight 124.15 kg (273 lb 11.2 oz), SpO2 93 %.  General appearance: alert, cooperative, appears stated age and no distress Resp: Improved air entry bilaterally. Cardio: regular rate and rhythm, S1, S2 normal, no murmur, click, rub or gallop GI: Abdomen continues to be soft. Nontender. Bowel sounds are present.  Neurologic: Alert and oriented 3. Cranial nerves II-12 intact. No motor deficits.       SignedReyne Dumas 06/07/2015, 12:34 PM        Time spent >45 mins

## 2015-06-07 NOTE — Progress Notes (Signed)
AVS discharge instructions were reviewed with patient. Patient will be given prescriptions for flagyl, cipro, reglan, lisinopril-hydrocholorthiazide, and lasix to take to her pharmacy. Patient stated that she did not have any questions. Staff will assist to transportation.

## 2015-06-07 NOTE — Progress Notes (Addendum)
Patient ID: Sharon [name], female   DOB: February 08, 1946, 69 y.o.   MRN: [id number]  Patient sleeping comfortably. No complaints. Tolerating diet. Feels good and excited about going home. Ok to go home from GI standpoint.

## 2015-06-12 ENCOUNTER — Emergency Department (HOSPITAL_COMMUNITY)
Admission: EM | Admit: 2015-06-12 | Discharge: 2015-06-12 | Disposition: A | Discharge status: Home / Self Care | Payer: BLUE CROSS/BLUE SHIELD | Attending: Emergency Medicine | Admitting: Emergency Medicine

## 2015-06-12 ENCOUNTER — Encounter (HOSPITAL_COMMUNITY): Payer: Self-pay | Admitting: Emergency Medicine

## 2015-06-12 DIAGNOSIS — Z791 Long term (current) use of non-steroidal anti-inflammatories (NSAID): Secondary | ICD-10-CM | POA: Diagnosis not present

## 2015-06-12 DIAGNOSIS — R531 Weakness: Secondary | ICD-10-CM | POA: Diagnosis not present

## 2015-06-12 DIAGNOSIS — R251 Tremor, unspecified: Secondary | ICD-10-CM

## 2015-06-12 DIAGNOSIS — K219 Gastro-esophageal reflux disease without esophagitis: Secondary | ICD-10-CM | POA: Insufficient documentation

## 2015-06-12 DIAGNOSIS — Z79899 Other long term (current) drug therapy: Secondary | ICD-10-CM | POA: Diagnosis not present

## 2015-06-12 DIAGNOSIS — E119 Type 2 diabetes mellitus without complications: Secondary | ICD-10-CM | POA: Insufficient documentation

## 2015-06-12 DIAGNOSIS — Z87442 Personal history of urinary calculi: Secondary | ICD-10-CM | POA: Diagnosis not present

## 2015-06-12 DIAGNOSIS — Z794 Long term (current) use of insulin: Secondary | ICD-10-CM | POA: Insufficient documentation

## 2015-06-12 DIAGNOSIS — R079 Chest pain, unspecified: Secondary | ICD-10-CM | POA: Diagnosis not present

## 2015-06-12 DIAGNOSIS — F419 Anxiety disorder, unspecified: Secondary | ICD-10-CM | POA: Insufficient documentation

## 2015-06-12 DIAGNOSIS — Z7982 Long term (current) use of aspirin: Secondary | ICD-10-CM | POA: Insufficient documentation

## 2015-06-12 DIAGNOSIS — I10 Essential (primary) hypertension: Secondary | ICD-10-CM | POA: Insufficient documentation

## 2015-06-12 DIAGNOSIS — I509 Heart failure, unspecified: Secondary | ICD-10-CM | POA: Diagnosis not present

## 2015-06-12 DIAGNOSIS — Z792 Long term (current) use of antibiotics: Secondary | ICD-10-CM | POA: Diagnosis not present

## 2015-06-12 DIAGNOSIS — E782 Mixed hyperlipidemia: Secondary | ICD-10-CM | POA: Diagnosis not present

## 2015-06-12 DIAGNOSIS — N179 Acute kidney failure, unspecified: Secondary | ICD-10-CM | POA: Diagnosis not present

## 2015-06-12 LAB — COMPREHENSIVE METABOLIC PANEL
ALBUMIN: 3.1 g/dL — AB (ref 3.5–5.0)
ALK PHOS: 57 U/L (ref 38–126)
ALT: 7 U/L — AB (ref 14–54)
AST: 12 U/L — AB (ref 15–41)
Anion gap: 13 (ref 5–15)
BILIRUBIN TOTAL: 0.5 mg/dL (ref 0.3–1.2)
BUN: 12 mg/dL (ref 6–20)
CALCIUM: 8.1 mg/dL — AB (ref 8.9–10.3)
CO2: 27 mmol/L (ref 22–32)
CREATININE: 1.5 mg/dL — AB (ref 0.44–1.00)
Chloride: 99 mmol/L — ABNORMAL LOW (ref 101–111)
GFR calc Af Amer: 40 mL/min — ABNORMAL LOW (ref >60)
GFR calc non Af Amer: 35 mL/min — ABNORMAL LOW (ref >60)
GLUCOSE: 348 mg/dL — AB (ref 65–99)
Potassium: 3.4 mmol/L — ABNORMAL LOW (ref 3.5–5.1)
SODIUM: 139 mmol/L (ref 135–145)
TOTAL PROTEIN: 7.1 g/dL (ref 6.5–8.1)

## 2015-06-12 LAB — CBC
HCT: 36 % (ref 36.0–46.0)
Hemoglobin: 11 g/dL — ABNORMAL LOW (ref 12.0–15.0)
MCH: 27.1 pg (ref 26.0–34.0)
MCHC: 30.6 g/dL (ref 30.0–36.0)
MCV: 88.7 fL (ref 78.0–100.0)
PLATELETS: 433 10*3/uL — AB (ref 150–400)
RBC: 4.06 MIL/uL (ref 3.87–5.11)
RDW: 14.4 % (ref 11.5–15.5)
WBC: 11.2 10*3/uL — ABNORMAL HIGH (ref 4.0–10.5)

## 2015-06-12 LAB — URINALYSIS, ROUTINE W REFLEX MICROSCOPIC
BILIRUBIN URINE: NEGATIVE
Glucose, UA: 250 mg/dL — AB
HGB URINE DIPSTICK: NEGATIVE
Ketones, ur: NEGATIVE mg/dL
Leukocytes, UA: NEGATIVE
NITRITE: NEGATIVE
Protein, ur: NEGATIVE mg/dL
SPECIFIC GRAVITY, URINE: 1.014 (ref 1.005–1.030)
pH: 6 (ref 5.0–8.0)

## 2015-06-12 LAB — MAGNESIUM: MAGNESIUM: 1.3 mg/dL — AB (ref 1.7–2.4)

## 2015-06-12 MED ORDER — SODIUM CHLORIDE 0.9 % IV BOLUS (SEPSIS)
1000.0000 mL | Freq: Once | INTRAVENOUS | Status: AC
  Administered 2015-06-12: 1000 mL via INTRAVENOUS

## 2015-06-12 MED ORDER — MAGNESIUM SULFATE 2 GM/50ML IV SOLN
2.0000 g | Freq: Once | INTRAVENOUS | Status: AC
  Administered 2015-06-12: 2 g via INTRAVENOUS
  Filled 2015-06-12: qty 50

## 2015-06-12 MED ORDER — DIPHENHYDRAMINE HCL 50 MG/ML IJ SOLN
12.5000 mg | Freq: Once | INTRAMUSCULAR | Status: AC
  Administered 2015-06-12: 12.5 mg via INTRAVENOUS
  Filled 2015-06-12: qty 1

## 2015-06-12 NOTE — ED Provider Notes (Signed)
CSN: [id number]     Arrival date & time 06/12/15  1101 History   First MD Initiated Contact with Patient 06/12/15 1449     Chief Complaint  Patient presents with  . Weakness  . Tremors     (Consider location/radiation/quality/duration/timing/severity/associated sxs/prior Treatment)  HPI  Patient is a 69 year old female with history of DM, HTN, HLD, CHF, and diverticulitis who presents with 3-4 days of tremors and generalized weakness. Patient was admitted to the hospital 15 days ago for diverticulitis and discharged 5 days ago. Patient noticed subtle jaw tremors for the past 3-4 days, however yesterday it became more severe. This morning it was very noticeable. She reports that she had similar symptoms in the hospital which she attributed to her "electrolytes being off." Patient denies any focal weakness or numbness. Of note, patient was started on reglan about a week ago for her gastroparesis. She was also discharged on cipro and flagyl for her diverticulitis. She reports intermittent chest pain that is at her baseline. No nausea or vomiting. She has daily diarrhea which is at her baseline.   Past Medical History  Diagnosis Date  . Diabetes mellitus   . Hypertension   . CHF (congestive heart failure) ([place])   . Asthma   . Osteoarthritis   . Anxiety   . GERD (gastroesophageal reflux disease)   . Kidney stones   . Hypercholesteremia   . Thrombophlebitis    Past Surgical History  Procedure Laterality Date  . Appendectomy    . Cholecystectomy    . Breast surgery    . Abdominal hysterectomy    . Carpal tunnel release    . Incision and drainage perirectal abscess    . Colonoscopy    . Esophagoscopy    . Pyelogram     Family History  Problem Relation Age of Onset  . Heart attack Mother     8 HEART ATTACKS  . Stroke Mother     3 STROKES  . Heart attack Father   . Stroke Father    Social History  Substance Use Topics  . Smoking status: Never Smoker   . Smokeless tobacco:  None  . Alcohol Use: Yes   OB History    No data available     Review of Systems  HENT: Negative.   Eyes: Negative.   Respiratory: Negative for shortness of breath.   Cardiovascular: Positive for chest pain.  Gastrointestinal: Positive for diarrhea. Negative for nausea, vomiting and abdominal pain.  Endocrine: Negative.   Genitourinary: Negative.   Musculoskeletal: Negative.   Skin: Negative.   Allergic/Immunologic: Negative.   Neurological: Positive for tremors and weakness. Negative for dizziness, syncope, facial asymmetry and numbness.  Hematological: Negative.   Psychiatric/Behavioral: Negative.    Allergies  Crestor; Indomethacin; Invanz; and Lipitor  Home Medications   Prior to Admission medications   Medication Sig Start Date End Date Taking? Authorizing Provider  aspirin EC 81 MG tablet Take 81 mg by mouth daily.      Historical Provider, MD  beta carotene w/minerals (OCUVITE) tablet Take 1 tablet by mouth daily.    Historical Provider, MD  Calcium Carbonate-Vitamin D (CALTRATE 600+D) 600-400 MG-UNIT per tablet Take 1 tablet by mouth 2 (two) times daily.     Historical Provider, MD  ciprofloxacin (CIPRO) 500 MG tablet Take 1 tablet (500 mg total) by mouth 2 (two) times daily. 06/07/15   Reyne Dumas, MD  dexlansoprazole (DEXILANT) 60 MG capsule Take 60 mg by mouth daily.  Historical Provider, MD  Dextromethorphan-Guaifenesin 60-1200 MG per 12 hr tablet Take 1 tablet by mouth 2 (two) times daily as needed (CONGESTION).     Historical Provider, MD  fish oil-omega-3 fatty acids 1000 MG capsule Take 1 g by mouth daily.      Historical Provider, MD  furosemide (LASIX) 40 MG tablet Take 1 tablet (40 mg total) by mouth daily. 06/07/15   Reyne Dumas, MD  gabapentin (NEURONTIN) 300 MG capsule Take 600 mg by mouth 2 (two) times daily.     Historical Provider, MD  hydrocortisone cream 0.5 % Apply 1 application topically 2 (two) times daily as needed for itching.    Historical  Provider, MD  Insulin Degludec (TRESIBA FLEXTOUCH [place]) Inject 120 Units into the skin daily.    Historical Provider, MD  lisinopril-hydrochlorothiazide (PRINZIDE,ZESTORETIC) 20-12.5 MG tablet Take 2 tablets by mouth daily. 06/13/15   Reyne Dumas, MD  loratadine (CLARITIN) 10 MG tablet Take 10 mg by mouth daily.      Historical Provider, MD  meloxicam (MOBIC) 15 MG tablet Take 15 mg by mouth daily.      Historical Provider, MD  metoCLOPramide (REGLAN) 5 MG tablet Take 1 tablet (5 mg total) by mouth 3 (three) times daily before meals. 06/07/15   Reyne Dumas, MD  metoprolol succinate (TOPROL XL) 25 MG 24 hr tablet Take 1 tablet (25 mg total) by mouth daily. 07/19/14   Jerline Pain, MD  metroNIDAZOLE (FLAGYL) 500 MG tablet Take 1 tablet (500 mg total) by mouth every 8 (eight) hours. 06/07/15   Reyne Dumas, MD  ondansetron (ZOFRAN) 4 MG tablet Take 4-8 mg by mouth every 8 (eight) hours as needed for nausea or vomiting. 4-8mg  prn 06/12/14   Historical Provider, MD  PARoxetine (PAXIL) 40 MG tablet Take 60 mg by mouth every morning.    Historical Provider, MD  polyethylene glycol (MIRALAX / GLYCOLAX) packet Take 17 g by mouth daily as needed for mild constipation.    Historical Provider, MD  simvastatin (ZOCOR) 40 MG tablet Take 40 mg by mouth every morning.     Historical Provider, MD  sitaGLIPtin (JANUVIA) 50 MG tablet Take 50 mg by mouth daily.    Historical Provider, MD   BP 177/80 mmHg  Pulse 63  Temp(Src) 98.1 F (36.7 C) (Oral)  Resp 18  Wt 117.482 kg  SpO2 96% Physical Exam  Constitutional: She is oriented to person, place, and time. She appears well-developed and well-nourished.  HENT:  Head: Normocephalic and atraumatic.  Eyes: EOM are normal. Pupils are equal, round, and reactive to light.  Neck: Normal range of motion. Neck supple.  Cardiovascular: Normal rate, regular rhythm and normal heart sounds.   No murmur heard. Pulmonary/Chest: Effort normal and breath sounds normal. No  respiratory distress.  Abdominal: Soft. Bowel sounds are normal. She exhibits no distension. There is no tenderness.  Musculoskeletal: Normal range of motion.  Neurological: She is alert and oriented to person, place, and time. She has normal strength. She displays tremor. No cranial nerve deficit or sensory deficit.  Fine jaw tremor noted. Finger-nose-finger testing intact.   Skin: Skin is warm and dry. No erythema.  Psychiatric: She has a normal mood and affect. Her behavior is normal.  Nursing note and vitals reviewed.   ED Course  Procedures (including critical care time) Labs Review Labs Reviewed  COMPREHENSIVE METABOLIC PANEL - Abnormal; Notable for the following:    Potassium 3.4 (*)    Chloride 99 (*)  Glucose, Bld 348 (*)    Creatinine, Ser 1.50 (*)    Calcium 8.1 (*)    Albumin 3.1 (*)    AST 12 (*)    ALT 7 (*)    GFR calc non Af Amer 35 (*)    GFR calc Af Amer 40 (*)    All other components within normal limits  CBC - Abnormal; Notable for the following:    WBC 11.2 (*)    Hemoglobin 11.0 (*)    Platelets 433 (*)    All other components within normal limits  MAGNESIUM - Abnormal; Notable for the following:    Magnesium 1.3 (*)    All other components within normal limits  URINALYSIS, ROUTINE W REFLEX MICROSCOPIC (NOT AT [facility])    Imaging Review No results found. I have personally reviewed and evaluated these images and lab results as part of my medical decision-making.   EKG Interpretation   Date/Time:  Tuesday June 12 2015 16:02:18 EST Ventricular Rate:  57 PR Interval:  229 QRS Duration: 83 QT Interval:  471 QTC Calculation: 459 R Axis:   -10 Text Interpretation:  Sinus rhythm Prolonged PR interval Anteroseptal  infarct, age indeterminate No significant change was found Confirmed by  Wyvonnia Dusky  MD, STEPHEN [phone]) on 06/12/2015 4:08:42 PM      MDM   Final diagnoses:  Tremor  Hypomagnesemia  AKI (acute kidney injury) [facility])   Patient is a  69 year old female with history of DM, HTN, HLD, CHF, and diverticulitis presenting with jaw tremors after being started on reglan for gastroparesis last week. Fine jaw tremor noted on exam, otherwise neurological exam intact with no focal findings. Patient with AKI noted and low magnesium, otherwise no significant electrolyte disturbances. Jaw tremor likely due to adverse effect of reglan, thought hypomagnesemia may be contributing. Will give 1L, NS, 2g of magnesium, and 12.5mg  of IV benadryl. Will discharge home. Instructed patient to stop reglan and follow up with PCP and/or neurology if continues to have tremor.   Vivi Barrack, MD 06/12/15 1704  Ezequiel Essex, MD 06/12/15 [phone]

## 2015-06-12 NOTE — ED Notes (Signed)
Pt is in stable condition upon d/c and is escorted from ED via wheelchair. 

## 2015-06-12 NOTE — ED Notes (Signed)
Attempted to start an IV with no success, unable to find another access site.

## 2015-06-12 NOTE — ED Notes (Signed)
Pt sts increased weakness and tremors x 5 days since leaving hospital for diverticulitis; pt sts her electrolytes were low while in hospital and unsure if could be same

## 2015-06-12 NOTE — Discharge Instructions (Signed)
Your tremor was probably due to your reglan. Please stop taking this medication. You should follow up with your doctor to have your kidney function rechecked later this week. If you continue to have tremors, please see your primary doctor or a neurologist.  Tremor A tremor is trembling or shaking that you cannot control. Most tremors affect the hands or arms. Tremors can also affect the head, vocal cords, face, and other parts of the body. There are many types of tremors. Common types include:   Essential tremor. These usually occur in people over the age of 82. It may run in families and can happen in otherwise healthy people.   Resting tremor. These occur when the muscles are at rest, such as when your hands are resting in your lap. People with Parkinson disease often have resting tremors.   Postural tremor. These occur when you try to hold a pose, such as keeping your hands outstretched.   Kinetic tremor. These occur during purposeful movement, such as trying to touch a finger to your nose.   Task-specific tremor. These may occur when you perform tasks such as handwriting, speaking, or standing.   Psychogenic tremor. These dramatically lessen or disappear when you are distracted. They can happen in people of all ages.  Some types of tremors have no known cause. Tremors can also be a symptom of nervous system problems (neurological disorders) that may occur with aging. Some tremors go away with treatment while others do not.  HOME CARE INSTRUCTIONS Watch your tremor for any changes. The following actions may help to lessen any discomfort you are feeling:  Take medicines only as directed by your health care provider.   Limit alcohol intake to no more than 1 drink per day for nonpregnant women and 2 drinks per day for men. One drink equals 12 oz of beer, 5 oz of wine, or 1 oz of hard liquor.  Do not use any tobacco products, including cigarettes, chewing tobacco, or electronic  cigarettes. If you need help quitting, ask your health care provider.   Avoid extreme heat or cold.   Limit the amount of caffeine you consumeas directed by your health care provider.   Try to get 8 hours of sleep each night.  Find ways to manage your stress, such as meditation or yoga.  Keep all follow-up visits as directed by your health care provider. This is important. SEEK MEDICAL CARE IF:  You start having a tremor after starting a new medicine.  You have tremor with other symptoms such as:  Numbness.  Tingling.  Pain.  Weakness.  Your tremor gets worse.  Your tremor interferes with your day-to-day life.   This information is not intended to replace advice given to you by your health care provider. Make sure you discuss any questions you have with your health care provider.   Document Released: 06/20/2002 Document Revised: 07/21/2014 Document Reviewed: 12/26/2013 Elsevier Interactive Patient Education [employer].

## 2015-08-21 ENCOUNTER — Other Ambulatory Visit: Payer: Self-pay | Admitting: Cardiology

## 2015-10-25 ENCOUNTER — Other Ambulatory Visit: Payer: Self-pay | Admitting: Gastroenterology

## 2015-10-25 DIAGNOSIS — R1084 Generalized abdominal pain: Secondary | ICD-10-CM

## 2015-11-01 ENCOUNTER — Ambulatory Visit
Admission: RE | Admit: 2015-11-01 | Discharge: 2015-11-01 | Disposition: A | Discharge status: Home / Self Care | Payer: BLUE CROSS/BLUE SHIELD | Source: Ambulatory Visit | Attending: Gastroenterology | Admitting: Gastroenterology

## 2015-11-01 DIAGNOSIS — R1084 Generalized abdominal pain: Secondary | ICD-10-CM

## 2015-11-01 MED ORDER — IOPAMIDOL (ISOVUE-300) INJECTION 61%
100.0000 mL | Freq: Once | INTRAVENOUS | Status: AC | PRN
  Administered 2015-11-01: 100 mL via INTRAVENOUS

## 2015-11-27 ENCOUNTER — Emergency Department (HOSPITAL_COMMUNITY)
Admission: EM | Admit: 2015-11-27 | Discharge: 2015-11-27 | Disposition: A | Discharge status: Home / Self Care | Payer: BLUE CROSS/BLUE SHIELD | Attending: Emergency Medicine | Admitting: Emergency Medicine

## 2015-11-27 ENCOUNTER — Emergency Department (HOSPITAL_COMMUNITY): Payer: BLUE CROSS/BLUE SHIELD

## 2015-11-27 ENCOUNTER — Encounter (HOSPITAL_COMMUNITY): Payer: Self-pay | Admitting: Emergency Medicine

## 2015-11-27 DIAGNOSIS — M1990 Unspecified osteoarthritis, unspecified site: Secondary | ICD-10-CM | POA: Diagnosis not present

## 2015-11-27 DIAGNOSIS — K219 Gastro-esophageal reflux disease without esophagitis: Secondary | ICD-10-CM | POA: Diagnosis not present

## 2015-11-27 DIAGNOSIS — S29001A Unspecified injury of muscle and tendon of front wall of thorax, initial encounter: Secondary | ICD-10-CM | POA: Diagnosis not present

## 2015-11-27 DIAGNOSIS — Z87442 Personal history of urinary calculi: Secondary | ICD-10-CM | POA: Diagnosis not present

## 2015-11-27 DIAGNOSIS — J45909 Unspecified asthma, uncomplicated: Secondary | ICD-10-CM | POA: Diagnosis not present

## 2015-11-27 DIAGNOSIS — S29002A Unspecified injury of muscle and tendon of back wall of thorax, initial encounter: Secondary | ICD-10-CM | POA: Insufficient documentation

## 2015-11-27 DIAGNOSIS — Z794 Long term (current) use of insulin: Secondary | ICD-10-CM | POA: Diagnosis not present

## 2015-11-27 DIAGNOSIS — Z791 Long term (current) use of non-steroidal anti-inflammatories (NSAID): Secondary | ICD-10-CM | POA: Insufficient documentation

## 2015-11-27 DIAGNOSIS — I10 Essential (primary) hypertension: Secondary | ICD-10-CM | POA: Insufficient documentation

## 2015-11-27 DIAGNOSIS — Z792 Long term (current) use of antibiotics: Secondary | ICD-10-CM | POA: Insufficient documentation

## 2015-11-27 DIAGNOSIS — Y998 Other external cause status: Secondary | ICD-10-CM | POA: Insufficient documentation

## 2015-11-27 DIAGNOSIS — E119 Type 2 diabetes mellitus without complications: Secondary | ICD-10-CM | POA: Insufficient documentation

## 2015-11-27 DIAGNOSIS — E7800 Pure hypercholesterolemia, unspecified: Secondary | ICD-10-CM | POA: Diagnosis not present

## 2015-11-27 DIAGNOSIS — Z7984 Long term (current) use of oral hypoglycemic drugs: Secondary | ICD-10-CM | POA: Insufficient documentation

## 2015-11-27 DIAGNOSIS — Y9389 Activity, other specified: Secondary | ICD-10-CM | POA: Insufficient documentation

## 2015-11-27 DIAGNOSIS — S3991XA Unspecified injury of abdomen, initial encounter: Secondary | ICD-10-CM | POA: Diagnosis not present

## 2015-11-27 DIAGNOSIS — I509 Heart failure, unspecified: Secondary | ICD-10-CM | POA: Diagnosis not present

## 2015-11-27 DIAGNOSIS — F419 Anxiety disorder, unspecified: Secondary | ICD-10-CM | POA: Diagnosis not present

## 2015-11-27 DIAGNOSIS — Z79899 Other long term (current) drug therapy: Secondary | ICD-10-CM | POA: Insufficient documentation

## 2015-11-27 DIAGNOSIS — R110 Nausea: Secondary | ICD-10-CM | POA: Insufficient documentation

## 2015-11-27 DIAGNOSIS — Y92410 Unspecified street and highway as the place of occurrence of the external cause: Secondary | ICD-10-CM | POA: Diagnosis not present

## 2015-11-27 DIAGNOSIS — T148 Other injury of unspecified body region: Secondary | ICD-10-CM | POA: Diagnosis not present

## 2015-11-27 DIAGNOSIS — Z7982 Long term (current) use of aspirin: Secondary | ICD-10-CM | POA: Insufficient documentation

## 2015-11-27 DIAGNOSIS — S199XXA Unspecified injury of neck, initial encounter: Secondary | ICD-10-CM | POA: Diagnosis present

## 2015-11-27 DIAGNOSIS — T148XXA Other injury of unspecified body region, initial encounter: Secondary | ICD-10-CM

## 2015-11-27 LAB — BASIC METABOLIC PANEL
Anion gap: 9 (ref 5–15)
BUN: 22 mg/dL — AB (ref 6–20)
CALCIUM: 9 mg/dL (ref 8.9–10.3)
CO2: 21 mmol/L — AB (ref 22–32)
Chloride: 106 mmol/L (ref 101–111)
Creatinine, Ser: 1.16 mg/dL — ABNORMAL HIGH (ref 0.44–1.00)
GFR calc Af Amer: 55 mL/min — ABNORMAL LOW (ref >60)
GFR, EST NON AFRICAN AMERICAN: 48 mL/min — AB (ref >60)
GLUCOSE: 186 mg/dL — AB (ref 65–99)
Potassium: 4 mmol/L (ref 3.5–5.1)
Sodium: 136 mmol/L (ref 135–145)

## 2015-11-27 LAB — CBC WITH DIFFERENTIAL/PLATELET
Basophils Absolute: 0 10*3/uL (ref 0.0–0.1)
Basophils Relative: 0 %
EOS PCT: 4 %
Eosinophils Absolute: 0.5 10*3/uL (ref 0.0–0.7)
HEMATOCRIT: 30.5 % — AB (ref 36.0–46.0)
Hemoglobin: 9.9 g/dL — ABNORMAL LOW (ref 12.0–15.0)
LYMPHS ABS: 2.1 10*3/uL (ref 0.7–4.0)
LYMPHS PCT: 16 %
MCH: 28.3 pg (ref 26.0–34.0)
MCHC: 32.5 g/dL (ref 30.0–36.0)
MCV: 87.1 fL (ref 78.0–100.0)
MONO ABS: 0.7 10*3/uL (ref 0.1–1.0)
MONOS PCT: 6 %
NEUTROS ABS: 9.8 10*3/uL — AB (ref 1.7–7.7)
Neutrophils Relative %: 74 %
PLATELETS: 300 10*3/uL (ref 150–400)
RBC: 3.5 MIL/uL — ABNORMAL LOW (ref 3.87–5.11)
RDW: 13.4 % (ref 11.5–15.5)
WBC: 13.2 10*3/uL — ABNORMAL HIGH (ref 4.0–10.5)

## 2015-11-27 MED ORDER — FENTANYL CITRATE (PF) 100 MCG/2ML IJ SOLN
50.0000 ug | INTRAMUSCULAR | Status: DC | PRN
  Administered 2015-11-27 (×2): 50 ug via INTRAVENOUS
  Filled 2015-11-27 (×2): qty 2

## 2015-11-27 MED ORDER — SODIUM CHLORIDE 0.9 % IV BOLUS (SEPSIS)
1000.0000 mL | Freq: Once | INTRAVENOUS | Status: AC
  Administered 2015-11-27: 1000 mL via INTRAVENOUS

## 2015-11-27 MED ORDER — ONDANSETRON HCL 4 MG/2ML IJ SOLN
4.0000 mg | Freq: Once | INTRAMUSCULAR | Status: AC
  Administered 2015-11-27: 4 mg via INTRAVENOUS
  Filled 2015-11-27: qty 2

## 2015-11-27 NOTE — ED Notes (Signed)
Pt ambulate in hallway.  States she is slightly dizzy and some back pain.
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
## 2015-11-27 NOTE — ED Notes (Signed)
Resident at bedside.  
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
## 2015-11-27 NOTE — ED Notes (Signed)
Patient transported to CT 

## 2015-11-27 NOTE — Discharge Instructions (Signed)
Take Motrin and Tylenol scheduled for pain.  See your primary care doctor in 1-2 days for reevaluation.

## 2015-11-28 ENCOUNTER — Emergency Department (HOSPITAL_COMMUNITY): Payer: PRIVATE HEALTH INSURANCE

## 2015-11-28 ENCOUNTER — Encounter (HOSPITAL_COMMUNITY): Payer: Self-pay | Admitting: Emergency Medicine

## 2015-11-28 ENCOUNTER — Emergency Department (HOSPITAL_COMMUNITY)
Admission: EM | Admit: 2015-11-28 | Discharge: 2015-11-28 | Disposition: A | Discharge status: Home / Self Care | Payer: PRIVATE HEALTH INSURANCE | Attending: Emergency Medicine | Admitting: Emergency Medicine

## 2015-11-28 DIAGNOSIS — K219 Gastro-esophageal reflux disease without esophagitis: Secondary | ICD-10-CM | POA: Insufficient documentation

## 2015-11-28 DIAGNOSIS — I509 Heart failure, unspecified: Secondary | ICD-10-CM | POA: Insufficient documentation

## 2015-11-28 DIAGNOSIS — I10 Essential (primary) hypertension: Secondary | ICD-10-CM | POA: Insufficient documentation

## 2015-11-28 DIAGNOSIS — Z791 Long term (current) use of non-steroidal anti-inflammatories (NSAID): Secondary | ICD-10-CM | POA: Diagnosis not present

## 2015-11-28 DIAGNOSIS — Z87442 Personal history of urinary calculi: Secondary | ICD-10-CM | POA: Insufficient documentation

## 2015-11-28 DIAGNOSIS — Z792 Long term (current) use of antibiotics: Secondary | ICD-10-CM | POA: Diagnosis not present

## 2015-11-28 DIAGNOSIS — F419 Anxiety disorder, unspecified: Secondary | ICD-10-CM | POA: Diagnosis not present

## 2015-11-28 DIAGNOSIS — Z7982 Long term (current) use of aspirin: Secondary | ICD-10-CM | POA: Diagnosis not present

## 2015-11-28 DIAGNOSIS — M1990 Unspecified osteoarthritis, unspecified site: Secondary | ICD-10-CM | POA: Diagnosis not present

## 2015-11-28 DIAGNOSIS — R1310 Dysphagia, unspecified: Secondary | ICD-10-CM

## 2015-11-28 DIAGNOSIS — J45909 Unspecified asthma, uncomplicated: Secondary | ICD-10-CM | POA: Insufficient documentation

## 2015-11-28 DIAGNOSIS — E119 Type 2 diabetes mellitus without complications: Secondary | ICD-10-CM | POA: Insufficient documentation

## 2015-11-28 DIAGNOSIS — Z79899 Other long term (current) drug therapy: Secondary | ICD-10-CM | POA: Diagnosis not present

## 2015-11-28 DIAGNOSIS — E7800 Pure hypercholesterolemia, unspecified: Secondary | ICD-10-CM | POA: Diagnosis not present

## 2015-11-28 MED ORDER — SODIUM CHLORIDE 0.9 % IV BOLUS (SEPSIS)
1000.0000 mL | Freq: Once | INTRAVENOUS | Status: AC
  Administered 2015-11-28: 1000 mL via INTRAVENOUS

## 2015-11-28 MED ORDER — IOPAMIDOL (ISOVUE-300) INJECTION 61%
INTRAVENOUS | Status: AC
  Administered 2015-11-28: 75 mL
  Filled 2015-11-28: qty 75

## 2015-11-28 MED ORDER — DIATRIZOATE MEGLUMINE & SODIUM 66-10 % PO SOLN
ORAL | Status: AC
  Filled 2015-11-28: qty 60

## 2015-11-28 NOTE — ED Notes (Signed)
Patient transported to X-ray 
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
## 2015-11-28 NOTE — Discharge Instructions (Signed)
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
## 2016-01-25 ENCOUNTER — Other Ambulatory Visit: Payer: Self-pay | Admitting: Family Medicine

## 2016-01-25 DIAGNOSIS — E1142 Type 2 diabetes mellitus with diabetic polyneuropathy: Secondary | ICD-10-CM | POA: Diagnosis not present

## 2016-01-25 DIAGNOSIS — Z794 Long term (current) use of insulin: Secondary | ICD-10-CM | POA: Diagnosis not present

## 2016-01-25 DIAGNOSIS — Z139 Encounter for screening, unspecified: Secondary | ICD-10-CM

## 2016-01-25 DIAGNOSIS — E1165 Type 2 diabetes mellitus with hyperglycemia: Secondary | ICD-10-CM | POA: Diagnosis not present

## 2016-02-14 DIAGNOSIS — I129 Hypertensive chronic kidney disease with stage 1 through stage 4 chronic kidney disease, or unspecified chronic kidney disease: Secondary | ICD-10-CM | POA: Diagnosis not present

## 2016-02-14 DIAGNOSIS — E7800 Pure hypercholesterolemia, unspecified: Secondary | ICD-10-CM | POA: Diagnosis not present

## 2016-02-14 DIAGNOSIS — E669 Obesity, unspecified: Secondary | ICD-10-CM | POA: Diagnosis not present

## 2016-02-14 DIAGNOSIS — N183 Chronic kidney disease, stage 3 (moderate): Secondary | ICD-10-CM | POA: Diagnosis not present

## 2016-02-14 DIAGNOSIS — K210 Gastro-esophageal reflux disease with esophagitis: Secondary | ICD-10-CM | POA: Diagnosis not present

## 2016-04-11 ENCOUNTER — Encounter: Payer: Self-pay | Admitting: Cardiology

## 2016-04-11 ENCOUNTER — Ambulatory Visit (INDEPENDENT_AMBULATORY_CARE_PROVIDER_SITE_OTHER): Payer: No Typology Code available for payment source | Admitting: Cardiology

## 2016-04-11 ENCOUNTER — Encounter (INDEPENDENT_AMBULATORY_CARE_PROVIDER_SITE_OTHER): Payer: Self-pay

## 2016-04-11 VITALS — BP 134/70 | HR 64 | Ht 59.0 in | Wt 243.4 lb

## 2016-04-11 DIAGNOSIS — I350 Nonrheumatic aortic (valve) stenosis: Secondary | ICD-10-CM

## 2016-04-11 DIAGNOSIS — R002 Palpitations: Secondary | ICD-10-CM

## 2016-04-11 DIAGNOSIS — I491 Atrial premature depolarization: Secondary | ICD-10-CM

## 2016-04-11 NOTE — Patient Instructions (Signed)
Medication Instructions:  The current medical regimen is effective;  continue present plan and medications.  Testing/Procedures: Your physician has requested that you have an echocardiogram. Echocardiography is a painless test that uses sound waves to create images of your heart. It provides your doctor with information about the size and shape of your heart and how well your heart's chambers and valves are working. This procedure takes approximately one hour. There are no restrictions for this procedure.  Follow-Up: Follow up in 1 year with Dr. Marlou Porch.  You will receive a letter in the mail 2 months before you are due.  Please call us when you receive this letter to schedule your follow up appointment.  Thank you for choosing [facility]!!

## 2016-04-11 NOTE — Progress Notes (Signed)
[place]. [address] [place], [place]  [address] Phone: [phone] Fax:  [phone]  Date:  04/11/2016   ID:  Sharon [name], DOB 1945-09-22, MRN [id number]  PCP:  Sharon Casco, MD   History of Present Illness: Sharon [name] is a 70 y.o. female here for evaluation of chest pain. She was in the emergency department on 06/28/14 with nausea, palpitations which were present for approximally 2 weeks. She checked her pulse and thought she was in atrial fibrillation, heart was irregular. She felt chest pressure with no radiation. No shortness of breath. No fevers or chills. No fainting. Since she works at [employer], she was able to monitor her heart with telemetry. I have personally reviewed some of the strips that she brought with her and they are sinus rhythm with PACs.  In emergency department, first-degree AV block was noted on EKG but no T-wave abnormalities. Cardiac markers were negative. She does have diabetes with a hemoglobin A1c of 7.2. Hypertension as well.  Since that experience, she has not had any further episodes of chest discomfort. Nuclear stress test was reassuring. She has generalized fatigue she states.  Since taking the Toprol, she has been doing quite well with her PACs, palpitations.  Thankfully, she continues to lose weight. 30 pounds in the past 3 years. Keep up the good work. She was a critical care nurse for 25 years.   Wt Readings from Last 3 Encounters:  04/11/16 243 lb 6.4 oz (110.4 kg)  06/12/15 259 lb (117.5 kg)  06/06/15 273 lb 11.2 oz (124.1 kg)     Past Medical History:  Diagnosis Date  . Anxiety   . Asthma   . CHF (congestive heart failure) ([place])   . Diabetes mellitus   . GERD (gastroesophageal reflux disease)   . Hypercholesteremia   . Hypertension   . Kidney stones   . Osteoarthritis   . Thrombophlebitis     Past Surgical History:  Procedure Laterality Date  . ABDOMINAL HYSTERECTOMY    . APPENDECTOMY    .  BREAST SURGERY    . CARPAL TUNNEL RELEASE    . CHOLECYSTECTOMY    . COLONOSCOPY    . ESOPHAGOSCOPY    . INCISION AND DRAINAGE PERIRECTAL ABSCESS    . pyelogram      Current Outpatient Prescriptions  Medication Sig Dispense Refill  . aspirin EC 81 MG tablet Take 81 mg by mouth daily.      . beta carotene w/minerals (OCUVITE) tablet Take 1 tablet by mouth daily.    . Calcium Carbonate-Vitamin D (CALTRATE 600+D) 600-400 MG-UNIT per tablet Take 1 tablet by mouth 2 (two) times daily.     Marland Kitchen dexlansoprazole (DEXILANT) 60 MG capsule Take 60 mg by mouth daily.      Marland Kitchen dextromethorphan-guaiFENesin (MUCINEX DM) 30-600 MG 12hr tablet Take 1-2 tablets by mouth 2 (two) times daily as needed for cough.    . dicyclomine (BENTYL) 20 MG tablet Take 1 tablet by mouth every 6 (six) hours as needed for spasms.   0  . furosemide (LASIX) 40 MG tablet Take 40 mg by mouth 2 (two) times daily.    Marland Kitchen gabapentin (NEURONTIN) 300 MG capsule Take 600 mg by mouth 2 (two) times daily.     Marland Kitchen ibuprofen (ADVIL,MOTRIN) 200 MG tablet Take 400-600 mg by mouth every 6 (six) hours as needed for mild pain or moderate pain.    Marland Kitchen lisinopril-hydrochlorothiazide (PRINZIDE,ZESTORETIC) 20-12.5 MG tablet Take 2  tablets by mouth daily. 30 tablet 0  . loratadine (CLARITIN) 10 MG tablet Take 10 mg by mouth daily.      . meloxicam (MOBIC) 15 MG tablet Take 15 mg by mouth daily.      . metoprolol succinate (TOPROL-XL) 25 MG 24 hr tablet TAKE ONE TABLET BY MOUTH ONCE DAILY 30 tablet 11  . ondansetron (ZOFRAN) 4 MG tablet Take 4-8 mg by mouth every 8 (eight) hours as needed for nausea or vomiting.   0  . PARoxetine (PAXIL) 40 MG tablet Take 40 mg by mouth every morning.     . polyethylene glycol (MIRALAX / GLYCOLAX) packet Take 17 g by mouth daily as needed for mild constipation.    . simvastatin (ZOCOR) 40 MG tablet Take 60 mg by mouth every morning.     . TRESIBA FLEXTOUCH 200 UNIT/ML SOPN Inject 120 Units into the skin every evening.   0    No current facility-administered medications for this visit.     Allergies:    Allergies  Allergen Reactions  . Crestor [Rosuvastatin] Other (See Comments)    Myalgias  . Indomethacin Other (See Comments)    dizziness  . Invanz [Ertapenem Sodium] Hives  . Lipitor [Atorvastatin Calcium] Other (See Comments)    Myalgias   . Reglan [Metoclopramide] Other (See Comments)    tremors    Social History:  The patient  reports that she has never smoked. She does not have any smokeless tobacco history on file. She reports that she drinks alcohol. She reports that she does not use drugs.   Family History  Problem Relation Age of Onset  . Heart attack Mother     8 HEART ATTACKS  . Stroke Mother     3 STROKES  . Heart attack Father   . Stroke Father     ROS:  Please see the history of present illness.   Denies any fevers, chills, orthopnea, PND, bleeding, syncope, strokelike symptoms. Positive generalized fatigue, palpitations.   All other systems reviewed and negative.   PHYSICAL EXAM: VS:  BP 134/70   Pulse 64   Ht 4\' 11"  (1.499 m)   Wt 243 lb 6.4 oz (110.4 kg)   LMP  (LMP Unknown)   BMI 49.16 kg/m  Well nourished, well developed, in no acute distress HEENT: normal, [place]/AT, EOMI Neck: no JVD, normal carotid upstroke, no bruit Cardiac:  normal S1, S2; RRR; 2/6 systolic right upper sternal border murmur Lungs:  clear to auscultation bilaterally, no wheezing, rhonchi or rales Abd: soft, nontender, no hepatomegaly, no bruits Ext: no edema, 2+ distal pulses Skin: warm and dry GU: deferred Neuro: no focal abnormalities noted, AAO x 3  EKG:  EKG performed today 04/11/16-sinus rhythm, first-degree AV block, 228 ms, old septal infarct pattern personally viewed-prior 07/19/14-sinus rhythm, first-degree AV block, heart rate 69, otherwise normal.  ECHO:   04/28/13: Normal EF, mild AS NUC: 07/24/14 - Low risk, normal EF. No ischemia.   ASSESSMENT AND PLAN:  1. Chest pain-nuclear stress  test was reassuring in 2016.episode occurred during anxiety/stress, no radiation. Was concerning enough however to go to the emergency room, concerned her partner, Dr. Oletta Lamas. 2. Palpitations-she had the fortune to check her telemetry strips at work and they are demonstrating PACs only. Evidence of atrial fibrillation. I do not feel strongly that she needs a Holter monitor at this time. We will continue to monitor clinically. Toprol 25 mg once a day doing well. If she is having a rough  episodes she may take an extra metoprolol. The Toprol seems to doing a very good job of helping keep her heart recall. I did explain to her that with the mild first-degree AV block of 220 ms this could worsen with beta blocker however it is very low-dose and it seems to be helping out symptomatically with her. We will continue to utilize. 3. Morbid Obesity-encourage weight loss. She has lost 30 pounds in the last 2 years. Excellent. 4. Mild aortic stenosis-should be of no clinical consequence at this time. We will check echo. 5. Diabetes-Dr. Buddy Duty 6. 90-month follow-up. She knows to call if any worrisome symptoms develop.  Signed, Candee Furbish, MD [facility]  04/11/2016 2:27 PM

## 2016-04-13 DIAGNOSIS — R569 Unspecified convulsions: Secondary | ICD-10-CM

## 2016-04-13 HISTORY — DX: Unspecified convulsions: R56.9

## 2016-04-15 ENCOUNTER — Encounter (HOSPITAL_COMMUNITY): Payer: Self-pay | Admitting: Emergency Medicine

## 2016-04-15 ENCOUNTER — Inpatient Hospital Stay (HOSPITAL_COMMUNITY)
Admission: EM | Admit: 2016-04-15 | Discharge: 2016-04-18 | DRG: 637 | Disposition: A | Discharge status: Home / Self Care | Payer: No Typology Code available for payment source | Attending: Internal Medicine | Admitting: Internal Medicine

## 2016-04-15 ENCOUNTER — Emergency Department (HOSPITAL_COMMUNITY): Payer: No Typology Code available for payment source

## 2016-04-15 ENCOUNTER — Inpatient Hospital Stay (HOSPITAL_COMMUNITY): Payer: No Typology Code available for payment source

## 2016-04-15 DIAGNOSIS — I5032 Chronic diastolic (congestive) heart failure: Secondary | ICD-10-CM | POA: Diagnosis not present

## 2016-04-15 DIAGNOSIS — Z90710 Acquired absence of both cervix and uterus: Secondary | ICD-10-CM | POA: Diagnosis not present

## 2016-04-15 DIAGNOSIS — M1990 Unspecified osteoarthritis, unspecified site: Secondary | ICD-10-CM | POA: Diagnosis present

## 2016-04-15 DIAGNOSIS — I10 Essential (primary) hypertension: Secondary | ICD-10-CM | POA: Diagnosis not present

## 2016-04-15 DIAGNOSIS — E1101 Type 2 diabetes mellitus with hyperosmolarity with coma: Secondary | ICD-10-CM | POA: Diagnosis not present

## 2016-04-15 DIAGNOSIS — Z9049 Acquired absence of other specified parts of digestive tract: Secondary | ICD-10-CM | POA: Diagnosis not present

## 2016-04-15 DIAGNOSIS — R569 Unspecified convulsions: Secondary | ICD-10-CM

## 2016-04-15 DIAGNOSIS — Z79899 Other long term (current) drug therapy: Secondary | ICD-10-CM

## 2016-04-15 DIAGNOSIS — R402431 Glasgow coma scale score 3-8, in the field [EMT or ambulance]: Secondary | ICD-10-CM | POA: Diagnosis not present

## 2016-04-15 DIAGNOSIS — Z8672 Personal history of thrombophlebitis: Secondary | ICD-10-CM | POA: Diagnosis not present

## 2016-04-15 DIAGNOSIS — I110 Hypertensive heart disease with heart failure: Secondary | ICD-10-CM | POA: Diagnosis present

## 2016-04-15 DIAGNOSIS — G9341 Metabolic encephalopathy: Secondary | ICD-10-CM | POA: Diagnosis not present

## 2016-04-15 DIAGNOSIS — Z8249 Family history of ischemic heart disease and other diseases of the circulatory system: Secondary | ICD-10-CM

## 2016-04-15 DIAGNOSIS — R4182 Altered mental status, unspecified: Secondary | ICD-10-CM | POA: Diagnosis not present

## 2016-04-15 DIAGNOSIS — Z7982 Long term (current) use of aspirin: Secondary | ICD-10-CM

## 2016-04-15 DIAGNOSIS — Z87442 Personal history of urinary calculi: Secondary | ICD-10-CM

## 2016-04-15 DIAGNOSIS — Z23 Encounter for immunization: Secondary | ICD-10-CM

## 2016-04-15 DIAGNOSIS — Z6841 Body Mass Index (BMI) 40.0 and over, adult: Secondary | ICD-10-CM | POA: Diagnosis not present

## 2016-04-15 DIAGNOSIS — R29818 Other symptoms and signs involving the nervous system: Secondary | ICD-10-CM | POA: Diagnosis not present

## 2016-04-15 DIAGNOSIS — E872 Acidosis: Secondary | ICD-10-CM | POA: Diagnosis present

## 2016-04-15 DIAGNOSIS — Z794 Long term (current) use of insulin: Secondary | ICD-10-CM | POA: Diagnosis not present

## 2016-04-15 DIAGNOSIS — F419 Anxiety disorder, unspecified: Secondary | ICD-10-CM | POA: Diagnosis present

## 2016-04-15 DIAGNOSIS — E7800 Pure hypercholesterolemia, unspecified: Secondary | ICD-10-CM | POA: Diagnosis present

## 2016-04-15 DIAGNOSIS — N179 Acute kidney failure, unspecified: Secondary | ICD-10-CM

## 2016-04-15 DIAGNOSIS — K219 Gastro-esophageal reflux disease without esophagitis: Secondary | ICD-10-CM | POA: Diagnosis present

## 2016-04-15 DIAGNOSIS — E1100 Type 2 diabetes mellitus with hyperosmolarity without nonketotic hyperglycemic-hyperosmolar coma (NKHHC): Secondary | ICD-10-CM | POA: Diagnosis not present

## 2016-04-15 DIAGNOSIS — Z823 Family history of stroke: Secondary | ICD-10-CM | POA: Diagnosis not present

## 2016-04-15 DIAGNOSIS — D332 Benign neoplasm of brain, unspecified: Secondary | ICD-10-CM

## 2016-04-15 DIAGNOSIS — R410 Disorientation, unspecified: Secondary | ICD-10-CM | POA: Diagnosis not present

## 2016-04-15 DIAGNOSIS — R9089 Other abnormal findings on diagnostic imaging of central nervous system: Secondary | ICD-10-CM

## 2016-04-15 DIAGNOSIS — E1165 Type 2 diabetes mellitus with hyperglycemia: Secondary | ICD-10-CM | POA: Diagnosis present

## 2016-04-15 DIAGNOSIS — R930 Abnormal findings on diagnostic imaging of skull and head, not elsewhere classified: Secondary | ICD-10-CM | POA: Diagnosis not present

## 2016-04-15 DIAGNOSIS — E0800 Diabetes mellitus due to underlying condition with hyperosmolarity without nonketotic hyperglycemic-hyperosmolar coma (NKHHC): Secondary | ICD-10-CM

## 2016-04-15 DIAGNOSIS — C719 Malignant neoplasm of brain, unspecified: Secondary | ICD-10-CM | POA: Diagnosis not present

## 2016-04-15 LAB — CBC
HCT: 35.2 % — ABNORMAL LOW (ref 36.0–46.0)
Hemoglobin: 11.2 g/dL — ABNORMAL LOW (ref 12.0–15.0)
MCH: 28.8 pg (ref 26.0–34.0)
MCHC: 31.8 g/dL (ref 30.0–36.0)
MCV: 90.5 fL (ref 78.0–100.0)
PLATELETS: 242 10*3/uL (ref 150–400)
RBC: 3.89 MIL/uL (ref 3.87–5.11)
RDW: 13.3 % (ref 11.5–15.5)
WBC: 7.5 10*3/uL (ref 4.0–10.5)

## 2016-04-15 LAB — COMPREHENSIVE METABOLIC PANEL
ALBUMIN: 3.6 g/dL (ref 3.5–5.0)
ALT: 10 U/L — ABNORMAL LOW (ref 14–54)
ANION GAP: 10 (ref 5–15)
AST: 11 U/L — ABNORMAL LOW (ref 15–41)
Alkaline Phosphatase: 113 U/L (ref 38–126)
BUN: 25 mg/dL — ABNORMAL HIGH (ref 6–20)
CHLORIDE: 97 mmol/L — AB (ref 101–111)
CO2: 19 mmol/L — AB (ref 22–32)
Calcium: 9.1 mg/dL (ref 8.9–10.3)
Creatinine, Ser: 1.25 mg/dL — ABNORMAL HIGH (ref 0.44–1.00)
GFR calc non Af Amer: 43 mL/min — ABNORMAL LOW (ref >60)
GFR, EST AFRICAN AMERICAN: 50 mL/min — AB (ref >60)
GLUCOSE: 1028 mg/dL — AB (ref 65–99)
POTASSIUM: 4.7 mmol/L (ref 3.5–5.1)
SODIUM: 126 mmol/L — AB (ref 135–145)
Total Bilirubin: 0.6 mg/dL (ref 0.3–1.2)
Total Protein: 6.9 g/dL (ref 6.5–8.1)

## 2016-04-15 LAB — I-STAT CHEM 8, ED
BUN: 27 mg/dL — ABNORMAL HIGH (ref 6–20)
CREATININE: 1 mg/dL (ref 0.44–1.00)
Calcium, Ion: 1.14 mmol/L — ABNORMAL LOW (ref 1.15–1.40)
Chloride: 96 mmol/L — ABNORMAL LOW (ref 101–111)
Glucose, Bld: >700 mg/dL (ref 65–99)
HEMATOCRIT: 37 % (ref 36.0–46.0)
HEMOGLOBIN: 12.6 g/dL (ref 12.0–15.0)
Potassium: 4.7 mmol/L (ref 3.5–5.1)
SODIUM: 128 mmol/L — AB (ref 135–145)
TCO2: 20 mmol/L (ref 0–100)

## 2016-04-15 LAB — DIFFERENTIAL
BASOS PCT: 0 %
Basophils Absolute: 0 10*3/uL (ref 0.0–0.1)
EOS PCT: 1 %
Eosinophils Absolute: 0.1 10*3/uL (ref 0.0–0.7)
Lymphocytes Relative: 14 %
Lymphs Abs: 1 10*3/uL (ref 0.7–4.0)
MONO ABS: 0.4 10*3/uL (ref 0.1–1.0)
Monocytes Relative: 5 %
NEUTROS PCT: 80 %
Neutro Abs: 6 10*3/uL (ref 1.7–7.7)

## 2016-04-15 LAB — CBG MONITORING, ED

## 2016-04-15 LAB — GLUCOSE, CAPILLARY
GLUCOSE-CAPILLARY: 306 mg/dL — AB (ref 65–99)
Glucose-Capillary: 265 mg/dL — ABNORMAL HIGH (ref 65–99)

## 2016-04-15 LAB — PROTIME-INR
INR: 1.07
PROTHROMBIN TIME: 14 s (ref 11.4–15.2)

## 2016-04-15 LAB — APTT: aPTT: 27 seconds (ref 24–36)

## 2016-04-15 LAB — I-STAT TROPONIN, ED: Troponin i, poc: 0 ng/mL (ref 0.00–0.08)

## 2016-04-15 MED ORDER — SODIUM CHLORIDE 0.9 % IV BOLUS (SEPSIS)
1000.0000 mL | Freq: Once | INTRAVENOUS | Status: AC
  Administered 2016-04-15: 1000 mL via INTRAVENOUS

## 2016-04-15 MED ORDER — LORAZEPAM 2 MG/ML IJ SOLN
INTRAMUSCULAR | Status: AC
Start: 2016-04-15 — End: 2016-04-16
  Filled 2016-04-15: qty 1

## 2016-04-15 MED ORDER — LORAZEPAM 2 MG/ML IJ SOLN
1.0000 mg | Freq: Once | INTRAMUSCULAR | Status: AC
  Administered 2016-04-16: 1 mg via INTRAVENOUS
  Filled 2016-04-15: qty 1

## 2016-04-15 MED ORDER — SODIUM CHLORIDE 0.9 % IV SOLN
INTRAVENOUS | Status: DC
  Administered 2016-04-15: 125 mL/h via INTRAVENOUS

## 2016-04-15 MED ORDER — SODIUM CHLORIDE 0.9 % IV SOLN
500.0000 mg | Freq: Two times a day (BID) | INTRAVENOUS | Status: DC
  Administered 2016-04-16 – 2016-04-17 (×3): 500 mg via INTRAVENOUS
  Filled 2016-04-15 (×4): qty 5

## 2016-04-15 MED ORDER — DEXTROSE-NACL 5-0.45 % IV SOLN: INTRAVENOUS | Status: DC

## 2016-04-15 MED ORDER — LORAZEPAM 2 MG/ML IJ SOLN: 1.0000 mg | INTRAMUSCULAR | Status: DC | PRN

## 2016-04-15 MED ORDER — DEXTROSE 50 % IV SOLN: 25.0000 mL | INTRAVENOUS | Status: DC | PRN

## 2016-04-15 MED ORDER — LORAZEPAM 2 MG/ML IJ SOLN
INTRAMUSCULAR | Status: AC
  Filled 2016-04-15: qty 1

## 2016-04-15 MED ORDER — SODIUM CHLORIDE 0.9 % IV SOLN
INTRAVENOUS | Status: DC
  Administered 2016-04-16: 08:00:00 via INTRAVENOUS

## 2016-04-15 MED ORDER — INSULIN REGULAR HUMAN 100 UNIT/ML IJ SOLN
INTRAMUSCULAR | Status: DC
  Administered 2016-04-15: 5.4 [IU]/h via INTRAVENOUS
  Filled 2016-04-15: qty 2.5

## 2016-04-15 MED ORDER — SODIUM CHLORIDE 0.9 % IV SOLN: INTRAVENOUS | Status: DC

## 2016-04-15 MED ORDER — DEXTROSE-NACL 5-0.45 % IV SOLN
INTRAVENOUS | Status: DC
  Administered 2016-04-16: 1000 mL via INTRAVENOUS

## 2016-04-15 MED ORDER — INSULIN REGULAR BOLUS VIA INFUSION
0.0000 [IU] | Freq: Three times a day (TID) | INTRAVENOUS | Status: DC
  Filled 2016-04-15: qty 10

## 2016-04-15 MED ORDER — FAMOTIDINE IN NACL 20-0.9 MG/50ML-% IV SOLN
20.0000 mg | Freq: Two times a day (BID) | INTRAVENOUS | Status: DC
  Administered 2016-04-15 – 2016-04-17 (×4): 20 mg via INTRAVENOUS
  Filled 2016-04-15 (×6): qty 50

## 2016-04-15 MED ORDER — LORAZEPAM 2 MG/ML IJ SOLN
1.0000 mg | Freq: Once | INTRAMUSCULAR | Status: AC
  Administered 2016-04-15: 1 mg via INTRAVENOUS

## 2016-04-15 MED ORDER — LORAZEPAM 2 MG/ML IJ SOLN
1.0000 mg | Freq: Once | INTRAMUSCULAR | Status: AC
Start: 2016-04-15 — End: 2016-04-15
  Administered 2016-04-15: 1 mg via INTRAVENOUS

## 2016-04-15 MED ORDER — SODIUM CHLORIDE 0.9 % IV SOLN
1000.0000 mg | Freq: Once | INTRAVENOUS | Status: AC
  Administered 2016-04-15: 1000 mg via INTRAVENOUS
  Filled 2016-04-15: qty 10

## 2016-04-15 MED ORDER — POTASSIUM CHLORIDE 10 MEQ/100ML IV SOLN
10.0000 meq | INTRAVENOUS | Status: AC
  Administered 2016-04-15 – 2016-04-16 (×2): 10 meq via INTRAVENOUS
  Filled 2016-04-15 (×2): qty 100

## 2016-04-15 MED ORDER — INSULIN ASPART 100 UNIT/ML ~~LOC~~ SOLN
10.0000 [IU] | Freq: Once | SUBCUTANEOUS | Status: AC
  Administered 2016-04-15: 10 [IU] via INTRAVENOUS
  Filled 2016-04-15: qty 1

## 2016-04-15 NOTE — Consult Note (Signed)
NEURO HOSPITALIST CONSULT NOTE    Reason for Consult:Code stroke  *  HPI:                                                                                                                                          Sharon [name] is an 70 y.o. female with history of diabetes and hypertension came in with altered mental status. 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 On my evaluation she was not following commands looking confused mute but moving her all 4 extremities. 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 Blood glucose level was reported more than 700  Unable to obtained review of system  Past Medical History:  Diagnosis Date  . Anxiety   . Asthma   . CHF (congestive heart failure) ([place])   . Diabetes mellitus   . GERD (gastroesophageal reflux disease)   . Hypercholesteremia   . Hypertension   . Kidney stones   . Osteoarthritis   . Thrombophlebitis     Past Surgical History:  Procedure Laterality Date  . ABDOMINAL HYSTERECTOMY    . APPENDECTOMY    . BREAST SURGERY    . CARPAL TUNNEL RELEASE    . CHOLECYSTECTOMY    . COLONOSCOPY    . ESOPHAGOSCOPY    . INCISION AND DRAINAGE PERIRECTAL ABSCESS    . pyelogram      Family History  Problem Relation Age of Onset  . Heart attack Mother     8 HEART ATTACKS  . Stroke Mother     3 STROKES  . Heart attack Father   . Stroke Father     Family History: Hypertension diabetes coronary artery disease stroke  Social History:  reports that she has never smoked. She does not have any smokeless tobacco history on file. She reports that she does not use drugs. Her alcohol history is not on file.  Allergies  Allergen Reactions  . Crestor [Rosuvastatin] Other (See Comments)     Myalgias  . Indomethacin Other (See Comments)    dizziness  . Invanz [Ertapenem Sodium] Hives  . Lipitor [Atorvastatin Calcium] Other (See Comments)    Myalgias   . Reglan [Metoclopramide] Other (See Comments)    tremors   Home Medications                      Prior to Admission medications   Medication Sig Start Date End Date Taking? Authorizing Provider  aspirin EC 81 MG tablet Take 81 mg by mouth daily.  Historical Provider, MD  beta carotene w/minerals (OCUVITE) tablet Take 1 tablet by mouth daily.    Historical Provider, MD  Calcium Carbonate-Vitamin D (CALTRATE 600+D) 600-400 MG-UNIT per tablet Take 1 tablet by mouth 2 (two) times daily.     Historical Provider, MD  dexlansoprazole (DEXILANT) 60 MG capsule Take 60 mg by mouth daily.      Historical Provider, MD  dextromethorphan-guaiFENesin (MUCINEX DM) 30-600 MG 12hr tablet Take 1-2 tablets by mouth 2 (two) times daily as needed for cough.    Historical Provider, MD  dicyclomine (BENTYL) 20 MG tablet Take 1 tablet by mouth every 6 (six) hours as needed for spasms.  08/28/15   Historical Provider, MD  furosemide (LASIX) 40 MG tablet Take 40 mg by mouth 2 (two) times daily.    Historical Provider, MD  gabapentin (NEURONTIN) 300 MG capsule Take 600 mg by mouth 2 (two) times daily.     Historical Provider, MD  ibuprofen (ADVIL,MOTRIN) 200 MG tablet Take 400-600 mg by mouth every 6 (six) hours as needed for mild pain or moderate pain.    Historical Provider, MD  lisinopril-hydrochlorothiazide (PRINZIDE,ZESTORETIC) 20-12.5 MG tablet Take 2 tablets by mouth daily. 06/13/15   Reyne Dumas, MD  loratadine (CLARITIN) 10 MG tablet Take 10 mg by mouth daily.      Historical Provider, MD  meloxicam (MOBIC) 15 MG tablet Take 15 mg by mouth daily.      Historical Provider, MD  metoprolol succinate (TOPROL-XL) 25 MG 24 hr tablet TAKE ONE TABLET BY MOUTH ONCE DAILY 08/21/15   Jerline Pain, MD  ondansetron  (ZOFRAN) 4 MG tablet Take 4-8 mg by mouth every 8 (eight) hours as needed for nausea or vomiting.  06/12/14   Historical Provider, MD  PARoxetine (PAXIL) 40 MG tablet Take 40 mg by mouth every morning.     Historical Provider, MD  polyethylene glycol (MIRALAX / GLYCOLAX) packet Take 17 g by mouth daily as needed for mild constipation.    Historical Provider, MD  simvastatin (ZOCOR) 40 MG tablet Take 60 mg by mouth every morning.     Historical Provider, MD  TRESIBA FLEXTOUCH 200 UNIT/ML SOPN Inject 120 Units into the skin every evening.  10/18/15   Historical Provider, MD      ROS:                                                                                                                                       As mentioned in history of present illness  Blood pressure 183/80, temperature 98.5 F (36.9 C), temperature source Oral, resp. rate 22, weight 116.9 kg (257 lb 11.5 oz), SpO2 99 %.   Neurologic Examination:  HEENT-  Normocephalic, no lesions, without obvious abnormality.  Normal external eye and conjunctiva.  Normal TM's bilaterally.  Normal auditory canals and external ears. Normal external nose, mucus membranes and septum.  Normal pharynx. CVS S1-S2 regular Pulmonary normal breath sounds Skin shows some ulcers and lower extremities Neurological Examination Patient looks confused does not follow any commands Pupils are reactive bilaterally, face seems symmetrical Unable to do motor exam but is moving all 4 extremities Deep tendon reflexes are diffusely absent Speech absent    Lab Results: Basic Metabolic Panel:  Recent Labs Lab 04/15/16 1707  NA 128*  K 4.7  CL 96*  GLUCOSE >700*  BUN 27*  CREATININE 1.00    Liver Function Tests: No results for input(s): AST, ALT, ALKPHOS, BILITOT, PROT, ALBUMIN in the last 168 hours. No results for input(s): LIPASE,  AMYLASE in the last 168 hours. No results for input(s): AMMONIA in the last 168 hours.  CBC:  Recent Labs Lab 04/15/16 1655 04/15/16 1707  WBC 7.5  --   NEUTROABS 6.0  --   HGB 11.2* 12.6  HCT 35.2* 37.0  MCV 90.5  --   PLT 242  --     Cardiac Enzymes: No results for input(s): CKTOTAL, CKMB, CKMBINDEX, TROPONINI in the last 168 hours.  Lipid Panel: No results for input(s): CHOL, TRIG, HDL, CHOLHDL, VLDL, LDLCALC in the last 168 hours.  CBG:  Recent Labs Lab 04/15/16 1656  GLUCAP >600*    Microbiology: Results for orders placed or performed during the hospital encounter of 05/27/15  C difficile quick scan w PCR reflex     Status: None   Collection Time: 05/28/15 10:35 AM  Result Value Ref Range Status   C Diff antigen NEGATIVE NEGATIVE Final   C Diff toxin NEGATIVE NEGATIVE Final   C Diff interpretation Negative for toxigenic C. difficile  Final    Coagulation Studies:  Recent Labs  04/15/16 1655  LABPROT 14.0  INR 1.07    Imaging: Ct Head Code Stroke W/o Cm  Result Date: 04/15/2016 CLINICAL DATA:  Code stroke. Patient not answering questions or moving extremities. EXAM: CT HEAD WITHOUT CONTRAST TECHNIQUE: Contiguous axial images were obtained from the base of the skull through the vertex without intravenous contrast. COMPARISON:  None. FINDINGS: Brain: Multilobulated fat attenuating lesion within the left anterior paramedian frontal lobe with the larger lower component measuring 23 x 18 x 18 mm (AP x ML x CC), series 201, image 13 and series 203, image 23. The lesion demonstrates marginal calcifications. No large acute territory infarct, intracranial hemorrhage, hydrocephalus, or extra-axial collection is identified. Mild chronic microvascular ischemic changes and parenchymal volume loss. Vascular: No hyperdense vessel or unexpected calcification. Skull: Normal. Negative for fracture or focal lesion. Sinuses/Orbits: No acute finding. Under pneumatized left mastoid  air cells with increased density compatible with sequelae of chronic otomastoiditis. Bilateral intra-ocular lens replacement. Other: None. ASPECTS [facility] Stroke Program Early CT Score) - Ganglionic level infarction (caudate, lentiform nuclei, internal capsule, insula, M1-M3 cortex): 7 - Supraganglionic infarction (M4-M6 cortex): 3 Total score (0-10 with 10 being normal): 10 IMPRESSION: 1. No acute intracranial abnormality is identified 2. ASPECTS is 10 3. 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 MRI with contrast is recommended for further characterization. These results were called by telephone at the time of interpretation on 04/15/2016 at 5:02 pm to Dr. Gifford Shave, who verbally acknowledged these results. Electronically Signed  By: Kristine Garbe M.D.   On: 04/15/2016 17:06     Assessment and recommendation This is a 70 year old female with history of diabetes and hypertension presented with altered mental status secondary to metabolic encephalopathy hyperglycemia. 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 At this point symptomatic and medical treatment recommended for hyperglycemia.

## 2016-04-15 NOTE — H&P (Signed)
History and Physical    Meghann Averbeck [id number] DOB: Apr 22, 1946 DOA: 04/15/2016  PCP: Osborne Casco, MD   Patient coming from: Work  Chief Complaint: Altered mental status  HPI: Sharon [name] is a 70 y.o. woman with a history of HTN, DM, chronic diastolic heart failure, and diverticular disease who seemed to be in her baseline state of health when she presented to work this morning (she is an Therapist, sports for [employer]).  After seeing her second patient, colleagues noticed mental status changes.  She was aphasic and would not follow commands.  She was referred to the ED for evaluation; Code Stroke was called.  She was evaluated by neurology in the ED.  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  MRI imaging recommended.  The patient was also found to have a markedly elevated blood sugar, serum glucose greater than 1000, with mild hyponatremia and acidosis.  She also had a witnessed seizure in the ED; thought to be related to hyperglycemia.  She received IV ativan 1mg  and keppra 1000mg  x 1.  She received 2L of NS.    She received a second dose of IV ativan, reportedly for agitation, prior to my assessment.  Thus my history and physical were limited.  Hospitalist asked to admit.  Review of Systems: Unable to obtain.   Past Medical History:  Diagnosis Date  . Anxiety   . Asthma   . CHF (congestive heart failure) ([place])   . Diabetes mellitus   . GERD (gastroesophageal reflux disease)   . Hypercholesteremia   . Hypertension   . Kidney stones   . Osteoarthritis   . Thrombophlebitis     Past Surgical History:  Procedure Laterality Date  . ABDOMINAL HYSTERECTOMY    . APPENDECTOMY    . BREAST SURGERY    . CARPAL TUNNEL RELEASE    . CHOLECYSTECTOMY    . COLONOSCOPY    . ESOPHAGOSCOPY    . INCISION AND DRAINAGE PERIRECTAL ABSCESS    . pyelogram      SOCIAL HISTORY: According  to her sister-in-law, patient lives independently.  She is not married.  She does not have children.  She does not smoke, drink EtOH, or use drugs.  Sister-in-law is next of kin.  Allergies  Allergen Reactions  . Crestor [Rosuvastatin] Other (See Comments)    Myalgias  . Indomethacin Other (See Comments)    dizziness  . Invanz [Ertapenem Sodium] Hives  . Lipitor [Atorvastatin Calcium] Other (See Comments)    Myalgias   . Reglan [Metoclopramide] Other (See Comments)    tremors    Family History  Problem Relation Age of Onset  . Heart attack Mother     8 HEART ATTACKS  . Stroke Mother     3 STROKES  . Heart attack Father   . Stroke Father   Brother died of complications related to metastatic melanoma.   Prior to Admission medications   Medication Sig Start Date End Date Taking? Authorizing Provider  aspirin EC 81 MG tablet Take 81 mg by mouth daily.      Historical Provider, MD  beta carotene w/minerals (OCUVITE) tablet Take 1 tablet by mouth daily.    Historical Provider, MD  Calcium Carbonate-Vitamin D (CALTRATE 600+D) 600-400 MG-UNIT per tablet Take 1 tablet by mouth 2 (two) times daily.     Historical Provider, MD  dexlansoprazole (DEXILANT) 60 MG capsule Take 60 mg  by mouth daily.      Historical Provider, MD  dextromethorphan-guaiFENesin (MUCINEX DM) 30-600 MG 12hr tablet Take 1-2 tablets by mouth 2 (two) times daily as needed for cough.    Historical Provider, MD  dicyclomine (BENTYL) 20 MG tablet Take 1 tablet by mouth every 6 (six) hours as needed for spasms.  08/28/15   Historical Provider, MD  furosemide (LASIX) 40 MG tablet Take 40 mg by mouth 2 (two) times daily.    Historical Provider, MD  gabapentin (NEURONTIN) 300 MG capsule Take 600 mg by mouth 2 (two) times daily.     Historical Provider, MD  ibuprofen (ADVIL,MOTRIN) 200 MG tablet Take 400-600 mg by mouth every 6 (six) hours as needed for mild pain or moderate pain.    Historical Provider, MD    lisinopril-hydrochlorothiazide (PRINZIDE,ZESTORETIC) 20-12.5 MG tablet Take 2 tablets by mouth daily. 06/13/15   Reyne Dumas, MD  loratadine (CLARITIN) 10 MG tablet Take 10 mg by mouth daily.      Historical Provider, MD  meloxicam (MOBIC) 15 MG tablet Take 15 mg by mouth daily.      Historical Provider, MD  metoprolol succinate (TOPROL-XL) 25 MG 24 hr tablet TAKE ONE TABLET BY MOUTH ONCE DAILY 08/21/15   Jerline Pain, MD  ondansetron (ZOFRAN) 4 MG tablet Take 4-8 mg by mouth every 8 (eight) hours as needed for nausea or vomiting.  06/12/14   Historical Provider, MD  PARoxetine (PAXIL) 40 MG tablet Take 40 mg by mouth every morning.     Historical Provider, MD  polyethylene glycol (MIRALAX / GLYCOLAX) packet Take 17 g by mouth daily as needed for mild constipation.    Historical Provider, MD  simvastatin (ZOCOR) 40 MG tablet Take 60 mg by mouth every morning.     Historical Provider, MD  TRESIBA FLEXTOUCH 200 UNIT/ML SOPN Inject 120 Units into the skin every evening.  10/18/15   Historical Provider, MD    Physical Exam: Vitals:   04/15/16 1730 04/15/16 1800 04/15/16 1840 04/15/16 1900  BP: 170/98 200/79 169/87 141/74  Pulse: 96 97 96 84  Resp: 25 (!) 35 17 19  Temp:      TempSrc:      SpO2: 96% 96% 96% 95%  Weight:          Constitutional: NAD, lethargic after IV ativan for me Vitals:   04/15/16 1730 04/15/16 1800 04/15/16 1840 04/15/16 1900  BP: 170/98 200/79 169/87 141/74  Pulse: 96 97 96 84  Resp: 25 (!) 35 17 19  Temp:      TempSrc:      SpO2: 96% 96% 96% 95%  Weight:       Eyes: PERRL, lids and conjunctivae normal ENMT: I cannot get the patient to open her mouth. Neck: normal appearance, thick but supple Respiratory: clear to auscultation bilaterally, no wheezing, no crackles. Normal respiratory effort. No accessory muscle use.  Cardiovascular: Normal rate, regular rhythm.  No extremity edema. 2+ pedal pulses.   GI: abdomen is super obese and protuberant but soft and  compressible.  No apparent tenderness.  Bowel sounds are present. Musculoskeletal:  No joint deformity in upper and lower extremities. Good ROM, no contractures. Normal muscle tone.  Skin: no rashes, warm and dry Neurologic: Moves all four extremities spontaneously, moans intermittently. Otherwise exam limited an unable to be obtained due to IV ativan Psychiatric: Unable to assess.    Labs on Admission: I have personally reviewed following labs and imaging studies  CBC:  Recent Labs Lab 04/15/16 1655 04/15/16 1707  WBC 7.5  --   NEUTROABS 6.0  --   HGB 11.2* 12.6  HCT 35.2* 37.0  MCV 90.5  --   PLT 242  --    Basic Metabolic Panel:  Recent Labs Lab 04/15/16 1655 04/15/16 1707  NA 126* 128*  K 4.7 4.7  CL 97* 96*  CO2 19*  --   GLUCOSE 1,028* >700*  BUN 25* 27*  CREATININE 1.25* 1.00  CALCIUM 9.1  --    GFR: Estimated Creatinine Clearance: 61.8 mL/min (by C-G formula based on SCr of 1 mg/dL). Liver Function Tests:  Recent Labs Lab 04/15/16 1655  AST 11*  ALT 10*  ALKPHOS 113  BILITOT 0.6  PROT 6.9  ALBUMIN 3.6   Coagulation Profile:  Recent Labs Lab 04/15/16 1655  INR 1.07   CBG:  Recent Labs Lab 04/15/16 1656 04/15/16 1914  GLUCAP >600* >600*   Urine analysis:  Radiological Exams on Admission: Dg Chest Portable 1 View  Result Date: 04/15/2016 CLINICAL DATA:  Seizure and altered mental status EXAM: PORTABLE CHEST 1 VIEW COMPARISON:  Chest radiograph Nov 27, 2015; chest CT Nov 28, 2015 FINDINGS: There is no edema or consolidation. There is cardiomegaly with pulmonary vascularity within normal limits. Aortic atherosclerosis noted. No adenopathy. No bone lesions. No pneumothorax. IMPRESSION: Stable cardiomegaly. Aortic atherosclerosis. No edema or consolidation. Electronically Signed   By: Lowella Grip III M.D.   On: 04/15/2016 17:46   Ct Head Code Stroke W/o Cm  Result Date: 04/15/2016 CLINICAL DATA:  Code stroke. Patient not answering  questions or moving extremities. EXAM: CT HEAD WITHOUT CONTRAST TECHNIQUE: Contiguous axial images were obtained from the base of the skull through the vertex without intravenous contrast. COMPARISON:  None. FINDINGS: Brain: Multilobulated fat attenuating lesion within the left anterior paramedian frontal lobe with the larger lower component measuring 23 x 18 x 18 mm (AP x ML x CC), series 201, image 13 and series 203, image 23. The lesion demonstrates marginal calcifications. No large acute territory infarct, intracranial hemorrhage, hydrocephalus, or extra-axial collection is identified. Mild chronic microvascular ischemic changes and parenchymal volume loss. Vascular: No hyperdense vessel or unexpected calcification. Skull: Normal. Negative for fracture or focal lesion. Sinuses/Orbits: No acute finding. Under pneumatized left mastoid air cells with increased density compatible with sequelae of chronic otomastoiditis. Bilateral intra-ocular lens replacement. Other: None. ASPECTS [facility] Stroke Program Early CT Score) - Ganglionic level infarction (caudate, lentiform nuclei, internal capsule, insula, M1-M3 cortex): 7 - Supraganglionic infarction (M4-M6 cortex): 3 Total score (0-10 with 10 being normal): 10 IMPRESSION: 1. No acute intracranial abnormality is identified 2. ASPECTS is 10 3. 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 MRI with contrast is recommended for further characterization. These results were called by telephone at the time of interpretation on 04/15/2016 at 5:02 pm to Dr. Gifford Shave, who verbally acknowledged these results. Electronically Signed   By: Kristine Garbe M.D.   On: 04/15/2016 17:06    EKG: Independently reviewed. NSR.  No acute ST segment changes  Assessment/Plan Active Problems:   Hypertension   Morbid obesity ([place])   Hyperosmolar (nonketotic) coma  (HCC)   Seizure (HCC)   AKI (acute kidney injury) ([place])   Chronic diastolic heart failure (HCC)      Hyperosmolar nonketotic hyperglycemia --Admit to stepdown unit for treatment with IV insulin per protocol --IV fluids per protocol --BMP q4h x  4 --Serial troponin --No evidence of pneumonia on chest xray; U/A still pending  Seizure --Likely secondary to profound hyperglycemia --Seizure precautions --Keppra 500mg  IV q12h --Ativan prn for active seizure --EEG in the AM --Neurology following --MRI brain still pending --NPO for now until mental status improves; holding home medications  Chronic diastolic heart failure, compensated for now --Will need to monitor volume status --Strict I/O --Daily weights   DVT prophylaxis: SCDs Code Status: FULL Family Communication: Sister in law Mariann Laster present in the ED at time of admission Disposition Plan: To be determined Consults called: Neurology Admission status: Inpatient, stepdown unit   TIME SPENT: 70 minutes   Eber Jones MD [facility] Pager [phone]  If 7PM-7AM, please contact night-coverage [email or web address] Password [email or web address]  04/15/2016, 9:06 PM

## 2016-04-15 NOTE — ED Notes (Signed)
Sister in law reported pt trying to climb out of bed.  Pt pulled off EKG leads, removed O2, states she has to leave, appears unaware of her surroundings and situation.  MD aware, order rec'd.

## 2016-04-15 NOTE — ED Notes (Signed)
Sister in law at bedside.  Pt remains post ictal.  Beginning to move around.  Does appear to recognize sister in law voice as she will open her eyes when talked to by her.

## 2016-04-15 NOTE — ED Notes (Signed)
Critical glucose reported to Dr. Gilford Raid.

## 2016-04-15 NOTE — ED Provider Notes (Signed)
[facility] DEPT Provider Note   CSN: [id number] Arrival date & time: 04/15/16  1633     History   Chief Complaint Chief Complaint  Patient presents with  . Altered Mental Status    HPI Sharon [name] is a 70 y.o. female.  Pt presents to the ED today with a Code Stroke.  The pt works as a Marine scientist at [employer] and was normal at 1430.  When she presented to the ED she was moving all 4 extremities, but would not talk or follow commands.  The pt was brought immediately to the CT scanner and the neurologist saw her there.  He cancelled the code stroke.  When the pt came back to her room, she was still not talking or following commands.      Past Medical History:  Diagnosis Date  . Anxiety   . Asthma   . CHF (congestive heart failure) ([place])   . Diabetes mellitus   . GERD (gastroesophageal reflux disease)   . Hypercholesteremia   . Hypertension   . Kidney stones   . Osteoarthritis   . Thrombophlebitis     Patient Active Problem List   Diagnosis Date Noted  . Acute on chronic diastolic (congestive) heart failure 06/04/2015  . Diverticulitis 05/28/2015  . Sigmoid diverticulitis 05/28/2015  . Hypokalemia 05/28/2015  . Diabetes mellitus type 2, controlled ([place]) 05/28/2015  . Chronic anemia 05/28/2015  . Diverticulitis of large intestine without perforation or abscess without bleeding   . Palpitation 04/12/2015  . Morbid obesity ([place]) 04/12/2015  . Mild aortic stenosis 04/12/2015  . Type 2 diabetes mellitus without complication ([place]) [id number]  . Diabetes mellitus ([place]) 06/27/2014  . Hypertension 06/27/2014  . Chest pain 06/27/2014  . Asthma 06/27/2014    Past Surgical History:  Procedure Laterality Date  . ABDOMINAL HYSTERECTOMY    . APPENDECTOMY    . BREAST SURGERY    . CARPAL TUNNEL RELEASE    . CHOLECYSTECTOMY    . COLONOSCOPY    . ESOPHAGOSCOPY    . INCISION AND DRAINAGE PERIRECTAL ABSCESS    . pyelogram      OB History    No data available        Home Medications    Prior to Admission medications   Medication Sig Start Date End Date Taking? Authorizing Provider  aspirin EC 81 MG tablet Take 81 mg by mouth daily.      Historical Provider, MD  beta carotene w/minerals (OCUVITE) tablet Take 1 tablet by mouth daily.    Historical Provider, MD  Calcium Carbonate-Vitamin D (CALTRATE 600+D) 600-400 MG-UNIT per tablet Take 1 tablet by mouth 2 (two) times daily.     Historical Provider, MD  dexlansoprazole (DEXILANT) 60 MG capsule Take 60 mg by mouth daily.      Historical Provider, MD  dextromethorphan-guaiFENesin (MUCINEX DM) 30-600 MG 12hr tablet Take 1-2 tablets by mouth 2 (two) times daily as needed for cough.    Historical Provider, MD  dicyclomine (BENTYL) 20 MG tablet Take 1 tablet by mouth every 6 (six) hours as needed for spasms.  08/28/15   Historical Provider, MD  furosemide (LASIX) 40 MG tablet Take 40 mg by mouth 2 (two) times daily.    Historical Provider, MD  gabapentin (NEURONTIN) 300 MG capsule Take 600 mg by mouth 2 (two) times daily.     Historical Provider, MD  ibuprofen (ADVIL,MOTRIN) 200 MG tablet Take 400-600 mg by mouth every 6 (six) hours as needed for mild pain or  moderate pain.    Historical Provider, MD  lisinopril-hydrochlorothiazide (PRINZIDE,ZESTORETIC) 20-12.5 MG tablet Take 2 tablets by mouth daily. 06/13/15   Reyne Dumas, MD  loratadine (CLARITIN) 10 MG tablet Take 10 mg by mouth daily.      Historical Provider, MD  meloxicam (MOBIC) 15 MG tablet Take 15 mg by mouth daily.      Historical Provider, MD  metoprolol succinate (TOPROL-XL) 25 MG 24 hr tablet TAKE ONE TABLET BY MOUTH ONCE DAILY 08/21/15   Jerline Pain, MD  ondansetron (ZOFRAN) 4 MG tablet Take 4-8 mg by mouth every 8 (eight) hours as needed for nausea or vomiting.  06/12/14   Historical Provider, MD  PARoxetine (PAXIL) 40 MG tablet Take 40 mg by mouth every morning.     Historical Provider, MD  polyethylene glycol (MIRALAX / GLYCOLAX) packet Take  17 g by mouth daily as needed for mild constipation.    Historical Provider, MD  simvastatin (ZOCOR) 40 MG tablet Take 60 mg by mouth every morning.     Historical Provider, MD  TRESIBA FLEXTOUCH 200 UNIT/ML SOPN Inject 120 Units into the skin every evening.  10/18/15   Historical Provider, MD    Family History Family History  Problem Relation Age of Onset  . Heart attack Mother     8 HEART ATTACKS  . Stroke Mother     3 STROKES  . Heart attack Father   . Stroke Father     Social History Social History  Substance Use Topics  . Smoking status: Never Smoker  . Smokeless tobacco: Not on file  . Alcohol use Not on file     Comment: unknown     Allergies   Crestor [rosuvastatin]; Indomethacin; Invanz [ertapenem sodium]; Lipitor [atorvastatin calcium]; and Reglan [metoclopramide]   Review of Systems Review of Systems  Unable to perform ROS: Mental status change     Physical Exam Updated Vital Signs BP 170/98   Pulse 96   Temp 98.5 F (36.9 C) (Oral)   Resp 25   Wt 257 lb 11.5 oz (116.9 kg)   LMP  (LMP Unknown)   SpO2 96%   BMI 52.05 kg/m   Physical Exam  Constitutional: She appears well-developed. She appears distressed.  HENT:  Head: Normocephalic and atraumatic.  Right Ear: External ear normal.  Left Ear: External ear normal.  Nose: Nose normal.  Eyes: Conjunctivae and EOM are normal. Pupils are equal, round, and reactive to light.  Neck: Normal range of motion. Neck supple.  Cardiovascular: Regular rhythm, normal heart sounds and intact distal pulses.  Tachycardia present.   Pulmonary/Chest: Effort normal and breath sounds normal.  Abdominal: Soft. Bowel sounds are normal.  Musculoskeletal: Normal range of motion.  Neurological: She is alert.  Pt is awake, but nonverbal.  She is moving her arms and legs, but is not following commands.  Skin: Skin is warm.     ED Treatments / Results  Labs (all labs ordered are listed, but only abnormal results are  displayed) Labs Reviewed  CBC - Abnormal; Notable for the following:       Result Value   Hemoglobin 11.2 (*)    HCT 35.2 (*)    All other components within normal limits  COMPREHENSIVE METABOLIC PANEL - Abnormal; Notable for the following:    Sodium 126 (*)    Chloride 97 (*)    CO2 19 (*)    Glucose, Bld 1,028 (*)    BUN 25 (*)    Creatinine,  Ser 1.25 (*)    AST 11 (*)    ALT 10 (*)    GFR calc non Af Amer 43 (*)    GFR calc Af Amer 50 (*)    All other components within normal limits  CBG MONITORING, ED - Abnormal; Notable for the following:    Glucose-Capillary >600 (*)    All other components within normal limits  I-STAT CHEM 8, ED - Abnormal; Notable for the following:    Sodium 128 (*)    Chloride 96 (*)    BUN 27 (*)    Glucose, Bld >700 (*)    Calcium, Ion 1.14 (*)    All other components within normal limits  PROTIME-INR  APTT  DIFFERENTIAL  URINALYSIS, ROUTINE W REFLEX MICROSCOPIC (NOT AT [facility])  BLOOD GAS, VENOUS  I-STAT TROPOININ, ED  CBG MONITORING, ED    EKG  EKG Interpretation  Date/Time:  Tuesday April 15 2016 17:09:03 EDT Ventricular Rate:  89 PR Interval:    QRS Duration: 94 QT Interval:  360 QTC Calculation: 438 R Axis:   74 Text Interpretation:  Sinus or ectopic atrial rhythm Multiple premature complexes, vent & supraven Borderline repolarization abnormality Confirmed by Gilford Raid MD, Scott Fix ([id number]) on 04/15/2016 5:13:28 PM       Radiology Dg Chest Portable 1 View  Result Date: 04/15/2016 CLINICAL DATA:  Seizure and altered mental status EXAM: PORTABLE CHEST 1 VIEW COMPARISON:  Chest radiograph Nov 27, 2015; chest CT Nov 28, 2015 FINDINGS: There is no edema or consolidation. There is cardiomegaly with pulmonary vascularity within normal limits. Aortic atherosclerosis noted. No adenopathy. No bone lesions. No pneumothorax. IMPRESSION: Stable cardiomegaly. Aortic atherosclerosis. No edema or consolidation. Electronically Signed   By: Lowella Grip III M.D.   On: 04/15/2016 17:46   Ct Head Code Stroke W/o Cm  Result Date: 04/15/2016 CLINICAL DATA:  Code stroke. Patient not answering questions or moving extremities. EXAM: CT HEAD WITHOUT CONTRAST TECHNIQUE: Contiguous axial images were obtained from the base of the skull through the vertex without intravenous contrast. COMPARISON:  None. FINDINGS: Brain: Multilobulated fat attenuating lesion within the left anterior paramedian frontal lobe with the larger lower component measuring 23 x 18 x 18 mm (AP x ML x CC), series 201, image 13 and series 203, image 23. The lesion demonstrates marginal calcifications. No large acute territory infarct, intracranial hemorrhage, hydrocephalus, or extra-axial collection is identified. Mild chronic microvascular ischemic changes and parenchymal volume loss. Vascular: No hyperdense vessel or unexpected calcification. Skull: Normal. Negative for fracture or focal lesion. Sinuses/Orbits: No acute finding. Under pneumatized left mastoid air cells with increased density compatible with sequelae of chronic otomastoiditis. Bilateral intra-ocular lens replacement. Other: None. ASPECTS [facility] Stroke Program Early CT Score) - Ganglionic level infarction (caudate, lentiform nuclei, internal capsule, insula, M1-M3 cortex): 7 - Supraganglionic infarction (M4-M6 cortex): 3 Total score (0-10 with 10 being normal): 10 IMPRESSION: 1. No acute intracranial abnormality is identified 2. ASPECTS is 10 3. 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 MRI with contrast is recommended for further characterization. These results were called by telephone at the time of interpretation on 04/15/2016 at 5:02 pm to Dr. Gifford Shave, who verbally acknowledged these results. Electronically Signed   By: Kristine Garbe M.D.   On: 04/15/2016 17:06    Procedures Procedures  (including critical care time)  Medications Ordered in ED Medications  insulin aspart (novoLOG) injection 10 Units (not  administered)  LORazepam (ATIVAN) 2 MG/ML injection (not administered)  levETIRAcetam (KEPPRA) 1,000 mg in sodium chloride 0.9 % 100 mL IVPB (not administered)  insulin regular bolus via infusion 0-10 Units (not administered)  insulin regular (NOVOLIN R,HUMULIN R) 250 Units in sodium chloride 0.9 % 250 mL (1 Units/mL) infusion (not administered)  dextrose 50 % solution 25 mL (not administered)  0.9 %  sodium chloride infusion (not administered)  dextrose 5 %-0.45 % sodium chloride infusion (not administered)  LORazepam (ATIVAN) injection 1 mg (not administered)  sodium chloride 0.9 % bolus 1,000 mL (1,000 mLs Intravenous New Bag/Given 04/15/16 1749)     Initial Impression / Assessment and Plan / ED Course  I have reviewed the triage vital signs and the nursing notes.  Pertinent labs & imaging results that were available during my care of the patient were reviewed by me and considered in my medical decision making (see chart for details).  Clinical Course  Value Comment By Time  Calcium Ionized: (!) 1.14 (Reviewed) Isla Pence, MD 10/03 1715  Calcium Ionized: (!) 1.14 (Reviewed) Isla Pence, MD 10/03 1715   Shortly after the pt arrived, she had a seizure that lasted for a few minutes.  It was grand mal.  Pt stopped seizing by the time the nurse brought the ativan to the room, but she was given 0.5 mg IV to prevent against more seizures and IV keppra was also ordered.  The pt did bite her tongue.  Pt d/w Dr. Lily Kocher ([facility]) who will admit her.  She requested that I speak with neurology regarding the seizure, so I spoke with Dr. Nicole Kindred.  He requested that I order an EEG and hold off on maintenance keppra unless she has another seizure.  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

## 2016-04-15 NOTE — ED Notes (Signed)
Attempted to call report

## 2016-04-15 NOTE — ED Notes (Signed)
Pt kicking legs, moving around, trying to get out of bed.  Not following commands, pulling at IVs and removing EKG leads, O2.  MD aware.

## 2016-04-15 NOTE — ED Triage Notes (Signed)
Initially called in as code stroke that was cancelled by neurologist s/p head CT.  Pt unable to talk, close eyes on command; could not show teeth.  Pt did squeeze hand when asked and moved feet.  Taken to room 40, blood drawn at bridge coagulated.  Blood redrawn; cbg > 600.  MD aware.  Pt began to mumble and look up, to the right.  She then began to experience seizure which lasted approx 1-1.5 min.  MD into room to witness.

## 2016-04-16 ENCOUNTER — Inpatient Hospital Stay (HOSPITAL_COMMUNITY): Payer: No Typology Code available for payment source

## 2016-04-16 ENCOUNTER — Encounter (HOSPITAL_COMMUNITY): Payer: Self-pay | Admitting: *Deleted

## 2016-04-16 LAB — MRSA PCR SCREENING: MRSA by PCR: NEGATIVE

## 2016-04-16 LAB — BASIC METABOLIC PANEL
ANION GAP: 12 (ref 5–15)
ANION GAP: 6 (ref 5–15)
ANION GAP: 9 (ref 5–15)
ANION GAP: 9 (ref 5–15)
BUN: 18 mg/dL (ref 6–20)
BUN: 16 mg/dL (ref 6–20)
BUN: 19 mg/dL (ref 6–20)
BUN: 19 mg/dL (ref 6–20)
CHLORIDE: 107 mmol/L (ref 101–111)
CHLORIDE: 109 mmol/L (ref 101–111)
CHLORIDE: 111 mmol/L (ref 101–111)
CHLORIDE: 112 mmol/L — AB (ref 101–111)
CO2: 20 mmol/L — ABNORMAL LOW (ref 22–32)
CO2: 19 mmol/L — AB (ref 22–32)
CO2: 21 mmol/L — AB (ref 22–32)
CO2: 21 mmol/L — AB (ref 22–32)
Calcium: 8.8 mg/dL — ABNORMAL LOW (ref 8.9–10.3)
Calcium: 8.9 mg/dL (ref 8.9–10.3)
Calcium: 8.9 mg/dL (ref 8.9–10.3)
Calcium: 9.1 mg/dL (ref 8.9–10.3)
Creatinine, Ser: 0.76 mg/dL (ref 0.44–1.00)
Creatinine, Ser: 0.8 mg/dL (ref 0.44–1.00)
Creatinine, Ser: 0.83 mg/dL (ref 0.44–1.00)
Creatinine, Ser: 0.91 mg/dL (ref 0.44–1.00)
GFR calc Af Amer: >60 mL/min (ref >60)
GFR calc Af Amer: >60 mL/min (ref >60)
GFR calc Af Amer: >60 mL/min (ref >60)
GFR calc Af Amer: >60 mL/min (ref >60)
GFR calc non Af Amer: >60 mL/min (ref >60)
GLUCOSE: 131 mg/dL — AB (ref 65–99)
GLUCOSE: 158 mg/dL — AB (ref 65–99)
GLUCOSE: 170 mg/dL — AB (ref 65–99)
GLUCOSE: 262 mg/dL — AB (ref 65–99)
POTASSIUM: 3.4 mmol/L — AB (ref 3.5–5.1)
POTASSIUM: 3.5 mmol/L (ref 3.5–5.1)
POTASSIUM: 3.6 mmol/L (ref 3.5–5.1)
POTASSIUM: 3.7 mmol/L (ref 3.5–5.1)
Sodium: 138 mmol/L (ref 135–145)
Sodium: 139 mmol/L (ref 135–145)
Sodium: 139 mmol/L (ref 135–145)
Sodium: 140 mmol/L (ref 135–145)

## 2016-04-16 LAB — GLUCOSE, CAPILLARY
GLUCOSE-CAPILLARY: 137 mg/dL — AB (ref 65–99)
GLUCOSE-CAPILLARY: 142 mg/dL — AB (ref 65–99)
GLUCOSE-CAPILLARY: 158 mg/dL — AB (ref 65–99)
GLUCOSE-CAPILLARY: 166 mg/dL — AB (ref 65–99)
GLUCOSE-CAPILLARY: 189 mg/dL — AB (ref 65–99)
GLUCOSE-CAPILLARY: 207 mg/dL — AB (ref 65–99)
GLUCOSE-CAPILLARY: 218 mg/dL — AB (ref 65–99)
GLUCOSE-CAPILLARY: 358 mg/dL — AB (ref 65–99)
Glucose-Capillary: 503 mg/dL (ref 65–99)
Glucose-Capillary: 146 mg/dL — ABNORMAL HIGH (ref 65–99)
Glucose-Capillary: 161 mg/dL — ABNORMAL HIGH (ref 65–99)
Glucose-Capillary: 162 mg/dL — ABNORMAL HIGH (ref 65–99)
Glucose-Capillary: 145 mg/dL — ABNORMAL HIGH (ref 65–99)
Glucose-Capillary: 199 mg/dL — ABNORMAL HIGH (ref 65–99)

## 2016-04-16 LAB — URINALYSIS, ROUTINE W REFLEX MICROSCOPIC
Bilirubin Urine: NEGATIVE
Hgb urine dipstick: NEGATIVE
Ketones, ur: NEGATIVE mg/dL
LEUKOCYTES UA: NEGATIVE
Nitrite: NEGATIVE
PROTEIN: NEGATIVE mg/dL
Specific Gravity, Urine: 1.037 — ABNORMAL HIGH (ref 1.005–1.030)
pH: 6 (ref 5.0–8.0)

## 2016-04-16 LAB — RAPID URINE DRUG SCREEN, HOSP PERFORMED
AMPHETAMINES: NOT DETECTED
Barbiturates: NOT DETECTED
Benzodiazepines: NOT DETECTED
COCAINE: NOT DETECTED
OPIATES: NOT DETECTED
Tetrahydrocannabinol: NOT DETECTED

## 2016-04-16 LAB — URINE MICROSCOPIC-ADD ON

## 2016-04-16 LAB — TROPONIN I
Troponin I: <0.03 ng/mL (ref <0.03)
Troponin I: <0.03 ng/mL (ref <0.03)

## 2016-04-16 MED ORDER — INSULIN ASPART 100 UNIT/ML ~~LOC~~ SOLN
0.0000 [IU] | Freq: Three times a day (TID) | SUBCUTANEOUS | Status: DC
  Administered 2016-04-16 (×2): 2 [IU] via SUBCUTANEOUS
  Administered 2016-04-17: 5 [IU] via SUBCUTANEOUS
  Administered 2016-04-17: 7 [IU] via SUBCUTANEOUS
  Administered 2016-04-18: 3 [IU] via SUBCUTANEOUS
  Administered 2016-04-18: 9 [IU] via SUBCUTANEOUS

## 2016-04-16 MED ORDER — SODIUM CHLORIDE 0.9 % IV BOLUS (SEPSIS): 500.0000 mL | Freq: Once | INTRAVENOUS | Status: DC

## 2016-04-16 MED ORDER — INSULIN GLARGINE 100 UNIT/ML ~~LOC~~ SOLN
15.0000 [IU] | Freq: Once | SUBCUTANEOUS | Status: AC
  Administered 2016-04-16: 15 [IU] via SUBCUTANEOUS
  Filled 2016-04-16: qty 0.15

## 2016-04-16 MED ORDER — ENOXAPARIN SODIUM 40 MG/0.4ML ~~LOC~~ SOLN
40.0000 mg | Freq: Every day | SUBCUTANEOUS | Status: DC
  Administered 2016-04-16 – 2016-04-17 (×2): 40 mg via SUBCUTANEOUS
  Filled 2016-04-16 (×2): qty 0.4

## 2016-04-16 MED ORDER — HALOPERIDOL LACTATE 5 MG/ML IJ SOLN
5.0000 mg | Freq: Once | INTRAMUSCULAR | Status: AC
  Administered 2016-04-16: 5 mg via INTRAVENOUS
  Filled 2016-04-16: qty 1

## 2016-04-16 MED ORDER — INFLUENZA VAC SPLIT QUAD 0.5 ML IM SUSY
0.5000 mL | PREFILLED_SYRINGE | INTRAMUSCULAR | Status: AC
  Administered 2016-04-18: 0.5 mL via INTRAMUSCULAR
  Filled 2016-04-16: qty 0.5

## 2016-04-16 MED ORDER — INSULIN GLARGINE 100 UNIT/ML ~~LOC~~ SOLN
5.0000 [IU] | Freq: Every day | SUBCUTANEOUS | Status: DC
  Administered 2016-04-16: 5 [IU] via SUBCUTANEOUS
  Filled 2016-04-16: qty 0.05

## 2016-04-16 MED ORDER — INSULIN GLARGINE 100 UNIT/ML ~~LOC~~ SOLN
5.0000 [IU] | Freq: Every day | SUBCUTANEOUS | Status: DC
  Filled 2016-04-16: qty 0.05

## 2016-04-16 MED ORDER — ORAL CARE MOUTH RINSE
15.0000 mL | Freq: Two times a day (BID) | OROMUCOSAL | Status: DC
  Administered 2016-04-16 – 2016-04-17 (×2): 15 mL via OROMUCOSAL

## 2016-04-16 MED ORDER — INSULIN GLARGINE 100 UNIT/ML ~~LOC~~ SOLN
20.0000 [IU] | Freq: Every day | SUBCUTANEOUS | Status: DC
  Administered 2016-04-17: 20 [IU] via SUBCUTANEOUS
  Filled 2016-04-16: qty 0.2

## 2016-04-16 NOTE — Progress Notes (Signed)
PROGRESS NOTE    Sharon [name]  [id number] DOB: 1945/07/21 DOA: 04/15/2016 PCP: Osborne Casco, MD Brief Narrative: Sharon [name] is a 70 y.o. woman with a history of HTN, DM, chronic diastolic heart failure, and diverticular disease who seemed to be in her baseline state of health when she presented to work 10/3 morning (she is an Therapist, sports for [employer]).  After seeing her second patient, colleagues noticed mental status changes.  She was aphasic and would not follow commands.  She was referred to the ED for evaluation; Code Stroke was called.  She was evaluated by neurology in the ED.  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  MRI imaging recommended. The patient was also found to have a markedly elevated blood sugar, serum glucose greater than 1000, with mild hyponatremia and acidosis. She also had a witnessed seizure in the ED; thought to be related to hyperglycemia.  She received IV ativan 1mg  and keppra.    Assessment & Plan: Hyperosmolar nonketotic hyperglycemia -improved, received small dose of lantus earlier this am -will increase lantus and stop insulin gtt and D51/2NS -FU Hbaic -unclear if she missed any insulin doses  Metabolic encephalopathy -due to HONK, seizure, and meds-got multiple doses of ativan, haldol, keppra last night -expect this to improve today  Seizure --Likely metabolic secondary to profound hyperglycemia -was started on Keppra by admitting MD after seizure in ER yesterday, doubt she needs this longterm, will await Neurology input -MRI brain pending, CT with possible dermoid cyst in L frontal lobe  Chronic diastolic heart failure -compensated  -cut down IVF, Strict I/O, monitor daily weights   DVT prophylaxis:Lovenox Code Status:Full Code Family Communication:None at bedside Disposition Plan: Keep in SDU, home when mentation and CBGs  improved   Consultants:   Neurology     Subjective: Sleepy, drowsy,   Objective: Vitals:   04/16/16 0320 04/16/16 0500 04/16/16 0911 04/16/16 1131  BP: (!) 146/67  (!) 134/58 (!) 150/63  Pulse: 67  (!) 55 60  Resp: (!) 21  19 20   Temp: 98.2 F (36.8 C)  97.4 F (36.3 C) 97.7 F (36.5 C)  TempSrc: Axillary  Oral Oral  SpO2: 97%  97% 97%  Weight:  113.3 kg (249 lb 12.5 oz)    Height:        Intake/Output Summary (Last 24 hours) at 04/16/16 1158 Last data filed at 04/16/16 0824  Gross per 24 hour  Intake          3349.54 ml  Output              800 ml  Net          2549.54 ml   Filed Weights   04/15/16 1715 04/15/16 2130 04/16/16 0500  Weight: 116.9 kg (257 lb 11.5 oz) 112.4 kg (247 lb 12.8 oz) 113.3 kg (249 lb 12.5 oz)    Examination:  General exam: somnolent, arousable, no distress Respiratory system: Clear to auscultation. Respiratory effort normal. Cardiovascular system: S1 & S2 heard, RRR. No JVD, murmurs, rubs, gallops or clicks. No pedal edema. Gastrointestinal system: Abdomen is nondistended, soft and nontender. No organomegaly or masses felt. Normal bowel sounds heard. Central nervous system: somnolent, arousable, No focal neurological deficits. Extremities: no edema Skin: No rashes, lesions or ulcers Psychiatry: unable to assess    Data Reviewed: I have personally reviewed following labs and imaging studies  CBC:  Recent  Labs Lab 04/15/16 1655 04/15/16 1707  WBC 7.5  --   NEUTROABS 6.0  --   HGB 11.2* 12.6  HCT 35.2* 37.0  MCV 90.5  --   PLT 242  --    Basic Metabolic Panel:  Recent Labs Lab 04/15/16 0001 04/15/16 1655 04/15/16 1707 04/16/16 0232 04/16/16 0546 04/16/16 0945  NA 138 126* 128* 139 139 140  K 3.5 4.7 4.7 3.7 3.4* 3.6  CL 107 97* 96* 109 112* 111  CO2 19* 19*  --  21* 21* 20*  GLUCOSE 262* 1,028* >700* 131* 158* 170*  BUN 19 25* 27* 19 18 16   CREATININE 0.91 1.25* 1.00 0.83 0.80 0.76  CALCIUM 8.9 9.1  --  9.1  8.9 8.8*   GFR: Estimated Creatinine Clearance: 81.6 mL/min (by C-G formula based on SCr of 0.76 mg/dL). Liver Function Tests:  Recent Labs Lab 04/15/16 1655  AST 11*  ALT 10*  ALKPHOS 113  BILITOT 0.6  PROT 6.9  ALBUMIN 3.6   No results for input(s): LIPASE, AMYLASE in the last 168 hours. No results for input(s): AMMONIA in the last 168 hours. Coagulation Profile:  Recent Labs Lab 04/15/16 1655  INR 1.07   Cardiac Enzymes:  Recent Labs Lab 04/15/16 0001 04/16/16 0232 04/16/16 0945  TROPONINI <0.03 <0.03 <0.03   BNP (last 3 results) No results for input(s): PROBNP in the last 8760 hours. HbA1C: No results for input(s): HGBA1C in the last 72 hours. CBG:  Recent Labs Lab 04/16/16 0435 04/16/16 0540 04/16/16 0628 04/16/16 0733 04/16/16 0909  GLUCAP 142* 162* 161* 145* 158*   Lipid Profile: No results for input(s): CHOL, HDL, LDLCALC, TRIG, CHOLHDL, LDLDIRECT in the last 72 hours. Thyroid Function Tests: No results for input(s): TSH, T4TOTAL, FREET4, T3FREE, THYROIDAB in the last 72 hours. Anemia Panel: No results for input(s): VITAMINB12, FOLATE, FERRITIN, TIBC, IRON, RETICCTPCT in the last 72 hours. Urine analysis:    Component Value Date/Time   COLORURINE STRAW (A) 04/15/2016 0014   APPEARANCEUR CLEAR 04/15/2016 0014   LABSPEC 1.037 (H) 04/15/2016 0014   PHURINE 6.0 04/15/2016 0014   GLUCOSEU >1000 (A) 04/15/2016 0014   HGBUR NEGATIVE 04/15/2016 0014   BILIRUBINUR NEGATIVE 04/15/2016 0014   KETONESUR NEGATIVE 04/15/2016 0014   PROTEINUR NEGATIVE 04/15/2016 0014   UROBILINOGEN 1.0 05/28/2015 0122   NITRITE NEGATIVE 04/15/2016 0014   LEUKOCYTESUR NEGATIVE 04/15/2016 0014   Sepsis Labs: @LABRCNTIP (procalcitonin:4,lacticidven:4)  ) Recent Results (from the past 240 hour(s))  MRSA PCR Screening     Status: None   Collection Time: 04/15/16 11:48 PM  Result Value Ref Range Status   MRSA by PCR NEGATIVE NEGATIVE Final    Comment:        The  GeneXpert MRSA Assay (FDA approved for NASAL specimens only), is one component of a comprehensive MRSA colonization surveillance program. It is not intended to diagnose MRSA infection nor to guide or monitor treatment for MRSA infections.          Radiology Studies: Dg Chest Portable 1 View  Result Date: 04/15/2016 CLINICAL DATA:  Seizure and altered mental status EXAM: PORTABLE CHEST 1 VIEW COMPARISON:  Chest radiograph Nov 27, 2015; chest CT Nov 28, 2015 FINDINGS: There is no edema or consolidation. There is cardiomegaly with pulmonary vascularity within normal limits. Aortic atherosclerosis noted. No adenopathy. No bone lesions. No pneumothorax. IMPRESSION: Stable cardiomegaly. Aortic atherosclerosis. No edema or consolidation. Electronically Signed   By: Lowella Grip III M.D.   On: 04/15/2016 17:46  Ct Head Code Stroke W/o Cm  Result Date: 04/15/2016 CLINICAL DATA:  Code stroke. Patient not answering questions or moving extremities. EXAM: CT HEAD WITHOUT CONTRAST TECHNIQUE: Contiguous axial images were obtained from the base of the skull through the vertex without intravenous contrast. COMPARISON:  None. FINDINGS: Brain: Multilobulated fat attenuating lesion within the left anterior paramedian frontal lobe with the larger lower component measuring 23 x 18 x 18 mm (AP x ML x CC), series 201, image 13 and series 203, image 23. The lesion demonstrates marginal calcifications. No large acute territory infarct, intracranial hemorrhage, hydrocephalus, or extra-axial collection is identified. Mild chronic microvascular ischemic changes and parenchymal volume loss. Vascular: No hyperdense vessel or unexpected calcification. Skull: Normal. Negative for fracture or focal lesion. Sinuses/Orbits: No acute finding. Under pneumatized left mastoid air cells with increased density compatible with sequelae of chronic otomastoiditis. Bilateral intra-ocular lens replacement. Other: None. ASPECTS  [facility] Stroke Program Early CT Score) - Ganglionic level infarction (caudate, lentiform nuclei, internal capsule, insula, M1-M3 cortex): 7 - Supraganglionic infarction (M4-M6 cortex): 3 Total score (0-10 with 10 being normal): 10 IMPRESSION: 1. No acute intracranial abnormality is identified 2. ASPECTS is 10 3. 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 MRI with contrast is recommended for further characterization. These results were called by telephone at the time of interpretation on 04/15/2016 at 5:02 pm to Dr. Gifford Shave, who verbally acknowledged these results. Electronically Signed   By: Kristine Garbe M.D.   On: 04/15/2016 17:06        Scheduled Meds: . famotidine (PEPCID) IV  20 mg Intravenous Q12H  . [START ON 04/17/2016] Influenza vac split quadrivalent PF  0.5 mL Intramuscular Tomorrow-1000  . insulin aspart  0-9 Units Subcutaneous TID WC  . insulin glargine  5 Units Subcutaneous Daily  . levETIRAcetam  500 mg Intravenous Q12H   Continuous Infusions: . sodium chloride 75 mL/hr at 04/16/16 0809  . insulin (NOVOLIN-R) infusion Stopped (04/16/16 0810)     LOS: 1 day    Time spent: 73min    Domenic Polite, MD [facility] Pager [phone]  If 7PM-7AM, please contact night-coverage [email or web address] Password [email or web address] 04/16/2016, 11:58 AM

## 2016-04-16 NOTE — Plan of Care (Signed)
Problem: Tissue Perfusion: Goal: Risk factors for ineffective tissue perfusion will decrease Outcome: Progressing SCD's and SQ Lovenox.   Problem: Activity: Goal: Risk for activity intolerance will decrease Outcome: Progressing Currently on bedrest.

## 2016-04-16 NOTE — Procedures (Signed)
Procedure EEG report  Date 04/26/2016 Location [facility] inpatient  Reason for the study 70 year old female came in with altered mental status and had a seizure  Current medications  . enoxaparin (LOVENOX) injection  40 mg Subcutaneous Daily  . famotidine (PEPCID) IV  20 mg Intravenous Q12H  . [START ON 04/17/2016] Influenza vac split quadrivalent PF  0.5 mL Intramuscular Tomorrow-1000  . insulin aspart  0-9 Units Subcutaneous TID WC  . [START ON 04/17/2016] insulin glargine  20 Units Subcutaneous Daily  . levETIRAcetam  500 mg Intravenous Q12H   Technical multichannel digital EEG recording using the international 10-20 placement system  Description of the recording Most of the recording was done in drowsy state. Background EEG activity is 7-8 Hz not well sustained. EEG recording comprised of generalized delta and theta slowing with reactivity. Epileptiform features were not seen during this recording. EKG normal sinus rhythm  Impression This EEG is normal with patient recorded in drowsy state and findings are consistent with generalized cerebral dysfunction. Epileptiform features were not seen during this recording

## 2016-04-16 NOTE — Progress Notes (Signed)
EEG completed; results pending.    

## 2016-04-16 NOTE — Clinical Social Work Note (Signed)
CSW acknowledges consult for "Ran out of diabetic medications; admitted with cbg >1000." Will notify RNCM.  CSW signing off. Consult again if any social work needs arise.  Dayton Scrape, [place]

## 2016-04-17 ENCOUNTER — Inpatient Hospital Stay (HOSPITAL_COMMUNITY): Payer: No Typology Code available for payment source

## 2016-04-17 LAB — CBC
HEMATOCRIT: 32.6 % — AB (ref 36.0–46.0)
HEMOGLOBIN: 10.3 g/dL — AB (ref 12.0–15.0)
MCH: 28 pg (ref 26.0–34.0)
MCHC: 31.6 g/dL (ref 30.0–36.0)
MCV: 88.6 fL (ref 78.0–100.0)
Platelets: 217 10*3/uL (ref 150–400)
RBC: 3.68 MIL/uL — ABNORMAL LOW (ref 3.87–5.11)
RDW: 13.1 % (ref 11.5–15.5)
WBC: 8.9 10*3/uL (ref 4.0–10.5)

## 2016-04-17 LAB — GLUCOSE, CAPILLARY
GLUCOSE-CAPILLARY: 343 mg/dL — AB (ref 65–99)
GLUCOSE-CAPILLARY: 283 mg/dL — AB (ref 65–99)
Glucose-Capillary: 261 mg/dL — ABNORMAL HIGH (ref 65–99)
Glucose-Capillary: 252 mg/dL — ABNORMAL HIGH (ref 65–99)

## 2016-04-17 LAB — BASIC METABOLIC PANEL
ANION GAP: 9 (ref 5–15)
BUN: 10 mg/dL (ref 6–20)
CO2: 20 mmol/L — ABNORMAL LOW (ref 22–32)
Calcium: 8.7 mg/dL — ABNORMAL LOW (ref 8.9–10.3)
Chloride: 109 mmol/L (ref 101–111)
Creatinine, Ser: 0.73 mg/dL (ref 0.44–1.00)
GFR calc Af Amer: >60 mL/min (ref >60)
GLUCOSE: 255 mg/dL — AB (ref 65–99)
POTASSIUM: 3.8 mmol/L (ref 3.5–5.1)
Sodium: 138 mmol/L (ref 135–145)

## 2016-04-17 MED ORDER — INSULIN GLARGINE 100 UNIT/ML ~~LOC~~ SOLN
15.0000 [IU] | Freq: Once | SUBCUTANEOUS | Status: AC
  Administered 2016-04-17: 15 [IU] via SUBCUTANEOUS
  Filled 2016-04-17: qty 0.15

## 2016-04-17 MED ORDER — INSULIN GLARGINE 100 UNIT/ML ~~LOC~~ SOLN
35.0000 [IU] | Freq: Every day | SUBCUTANEOUS | Status: DC
  Administered 2016-04-18: 35 [IU] via SUBCUTANEOUS
  Filled 2016-04-17: qty 0.35

## 2016-04-17 MED ORDER — ONDANSETRON HCL 4 MG/2ML IJ SOLN
4.0000 mg | Freq: Four times a day (QID) | INTRAMUSCULAR | Status: DC | PRN
  Administered 2016-04-17 (×2): 4 mg via INTRAVENOUS
  Filled 2016-04-17 (×3): qty 2

## 2016-04-17 MED ORDER — GADOBENATE DIMEGLUMINE 529 MG/ML IV SOLN
20.0000 mL | Freq: Once | INTRAVENOUS | Status: AC
  Administered 2016-04-17: 10 mL via INTRAVENOUS

## 2016-04-17 NOTE — Progress Notes (Signed)
Subjective: No further seizures overnight. Tolerating Keppra well.  Exam: Vitals:   04/17/16 0721 04/17/16 1142  BP: (!) 156/94 (!) 162/63  Pulse: 64 63  Resp: (!) 22 14  Temp: 98.3 F (36.8 C) 98.2 F (36.8 C)        Gen: In bed, NAD MS: She is alert and oriented follows all commands CN: Nerves II through XII are grossly intact Motor: Moving all extremities well with residual action tremor which was present at baseline prior to hospitalization Sensory: Sensation grossly intact   Pertinent Labs/Diagnostics: EEG: Recording show drowsy state findings consistent with generalized encephalopathy no epileptiform discharges were noted.  MRI brain: Shows no acute intracranial abnormality. There is a bilobed fat lesion in the inferior frontal region of the left with peripheral calcification that is noted on CT. 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 I ending is likely incidental.  Etta Quill PA-C [facility] [phone]  Impression: This is a 70 year old female with new onset seizure in the setting of hyperglycemia. Seizures felt to be provoked by hyperglycemia. At this time see no further role of Keppra and this may be DC'd prior to discharge. If patient continues to have seizure activity will likely at that time need to readdress antiepileptic medication.  No driving, operating heavy machinery, perform activities at heights, swimming or participation in water activities until release by outpatient physician.  This has been discussed with patient.    Neurology will sign off at this time.    04/17/2016, 11:46 AM

## 2016-04-17 NOTE — Progress Notes (Signed)
Spoke with patient about her diabetes. Was diagnosed about 30 years ago. Has been on Tresiba 120 units daily for about 4 years.  Has been on Lantus in the past. Dr. Buddy Duty is her endocrinologist. PCP is Dr. [name].  HgbA1C was 8.9% the last visit. Has history of diverticulosis. States that she had not reordered her Tyler Aas last week and was depressed at the time and did not take her medications. Bariatric surgery has been suggested to her by a surgeon, but she is not sure about it. Recommended attending the bariatric surgery seminar that [facility] has for interested patients.  Suggested that she get a Life Alert button to wear at home since she lives by herself and for future emergencies. States that she does not check blood sugars at home. Attempted to encourage her that she can take control. Will continue to monitor blood sugars while in the hospital. Harvel Ricks RN BSN CDE

## 2016-04-17 NOTE — Progress Notes (Signed)
Report given to nurse accepting pt on 5W.

## 2016-04-17 NOTE — Progress Notes (Signed)
Paged Dr Broadus John to ask if RN needed to take and stay with pt in MRI, received call back from Dr Broadus John, she stated pt was stable enough to have nurse leave her for MRI. Consuelo Pandy RN

## 2016-04-17 NOTE — Progress Notes (Signed)
PROGRESS NOTE    Sharon [name]  [id number] DOB: 1946/01/04 DOA: 04/15/2016 PCP: Osborne Casco, MD Brief Narrative: Maydell Grinstead is a 70 y.o. woman with a history of HTN, DM, chronic diastolic heart failure, and diverticular disease who seemed to be in her baseline state of health when she presented to work 10/3 morning (she is an Therapist, sports for [employer]).  After seeing her second patient, colleagues noticed mental status changes.  She was aphasic and would not follow commands.  She was referred to the ED for evaluation; Code Stroke was called.  She was evaluated by neurology in the ED.  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  MRI imaging recommended. The patient was also found to have a markedly elevated blood sugar, serum glucose greater than 1000, with mild hyponatremia and acidosis. She also had a witnessed seizure in the ED; thought to be related to hyperglycemia.  She received IV ativan 1mg  and keppra.    Assessment & Plan: Hyperosmolar nonketotic hyperglycemia -improved, off insulin gtt and IVF -she ran out of [place] on Friday  -increase lantus, SSI, diet -FU Hbaic  Metabolic encephalopathy -due to HONK, seizure, and meds-got multiple doses of ativan, haldol, keppra night of admission -improved  Seizure --Likely metabolic secondary to profound hyperglycemia -was started on Keppra by admitting MD after seizure in ER , doubt she needs this longterm, d/w Neurology today, MRI notable for a lipoma only -stop Keppra  Chronic diastolic heart failure -compensated  -stop IVF   DVT prophylaxis:Lovenox Code Status:Full Code Family Communication:None at bedside Disposition Plan: Transfer to floor today   Consultants:   Neurology     Subjective: Awake, alert, feels well  Objective: Vitals:   04/17/16 0034 04/17/16 0419 04/17/16 0721 04/17/16 1142  BP: (!)  143/59 (!) 155/71 (!) 156/94 (!) 162/63  Pulse: 76 67 64 63  Resp: (!) 25 (!) 30 (!) 22 14  Temp: 99.6 F (37.6 C) 99.3 F (37.4 C) 98.3 F (36.8 C) 98.2 F (36.8 C)  TempSrc: Oral Oral Oral Oral  SpO2: 94% 95% 98% 99%  Weight:  113.5 kg (250 lb 3.6 oz)    Height:  5\' 3"  (1.6 m)      Intake/Output Summary (Last 24 hours) at 04/17/16 1203 Last data filed at 04/17/16 1000  Gross per 24 hour  Intake          1460.83 ml  Output                0 ml  Net          1460.83 ml   Filed Weights   04/15/16 2130 04/16/16 0500 04/17/16 0419  Weight: 112.4 kg (247 lb 12.8 oz) 113.3 kg (249 lb 12.5 oz) 113.5 kg (250 lb 3.6 oz)    Examination:  General exam: AAOx3 Respiratory system: Clear to auscultation. Respiratory effort normal. Cardiovascular system: S1 & S2 heard, RRR. No JVD, murmurs, rubs, gallops or clicks. No pedal edema. Gastrointestinal system: Abdomen is nondistended, soft and nontender. No organomegaly or masses felt. Normal bowel sounds heard. Central nervous system:  No focal neurological deficits. Extremities: no edema Skin: No rashes, lesions or ulcers Psychiatry: unable to assess    Data Reviewed: I have personally reviewed following labs and imaging studies  CBC:  Recent Labs Lab 04/15/16 1655 04/15/16 1707 04/17/16 0010  WBC 7.5  --  8.9  NEUTROABS 6.0  --   --  HGB 11.2* 12.6 10.3*  HCT 35.2* 37.0 32.6*  MCV 90.5  --  88.6  PLT 242  --  [id number]   Basic Metabolic Panel:  Recent Labs Lab 04/15/16 1655 04/15/16 1707 04/16/16 0232 04/16/16 0546 04/16/16 0945 04/17/16 0010  NA 126* 128* 139 139 140 138  K 4.7 4.7 3.7 3.4* 3.6 3.8  CL 97* 96* 109 112* 111 109  CO2 19*  --  21* 21* 20* 20*  GLUCOSE 1,028* >700* 131* 158* 170* 255*  BUN 25* 27* 19 18 16 10   CREATININE 1.25* 1.00 0.83 0.80 0.76 0.73  CALCIUM 9.1  --  9.1 8.9 8.8* 8.7*   GFR: Estimated Creatinine Clearance: 81.6 mL/min (by C-G formula based on SCr of 0.73 mg/dL). Liver Function  Tests:  Recent Labs Lab 04/15/16 1655  AST 11*  ALT 10*  ALKPHOS 113  BILITOT 0.6  PROT 6.9  ALBUMIN 3.6   No results for input(s): LIPASE, AMYLASE in the last 168 hours. No results for input(s): AMMONIA in the last 168 hours. Coagulation Profile:  Recent Labs Lab 04/15/16 1655  INR 1.07   Cardiac Enzymes:  Recent Labs Lab 04/15/16 0001 04/16/16 0232 04/16/16 0945  TROPONINI <0.03 <0.03 <0.03   BNP (last 3 results) No results for input(s): PROBNP in the last 8760 hours. HbA1C: No results for input(s): HGBA1C in the last 72 hours. CBG:  Recent Labs Lab 04/16/16 1124 04/16/16 1750 04/16/16 2111 04/17/16 0821 04/17/16 1141  GLUCAP 189* 199* 218* 261* 252*   Lipid Profile: No results for input(s): CHOL, HDL, LDLCALC, TRIG, CHOLHDL, LDLDIRECT in the last 72 hours. Thyroid Function Tests: No results for input(s): TSH, T4TOTAL, FREET4, T3FREE, THYROIDAB in the last 72 hours. Anemia Panel: No results for input(s): VITAMINB12, FOLATE, FERRITIN, TIBC, IRON, RETICCTPCT in the last 72 hours. Urine analysis:    Component Value Date/Time   COLORURINE STRAW (A) 04/15/2016 0014   APPEARANCEUR CLEAR 04/15/2016 0014   LABSPEC 1.037 (H) 04/15/2016 0014   PHURINE 6.0 04/15/2016 0014   GLUCOSEU >1000 (A) 04/15/2016 0014   HGBUR NEGATIVE 04/15/2016 0014   BILIRUBINUR NEGATIVE 04/15/2016 0014   KETONESUR NEGATIVE 04/15/2016 0014   PROTEINUR NEGATIVE 04/15/2016 0014   UROBILINOGEN 1.0 05/28/2015 0122   NITRITE NEGATIVE 04/15/2016 0014   LEUKOCYTESUR NEGATIVE 04/15/2016 0014   Sepsis Labs: @LABRCNTIP (procalcitonin:4,lacticidven:4)  ) Recent Results (from the past 240 hour(s))  MRSA PCR Screening     Status: None   Collection Time: 04/15/16 11:48 PM  Result Value Ref Range Status   MRSA by PCR NEGATIVE NEGATIVE Final    Comment:        The GeneXpert MRSA Assay (FDA approved for NASAL specimens only), is one component of a comprehensive MRSA  colonization surveillance program. It is not intended to diagnose MRSA infection nor to guide or monitor treatment for MRSA infections.          Radiology Studies: Mr Jeri Cos Wo Contrast  Result Date: 04/17/2016 CLINICAL DATA:  Acute onset of aphasia and confusion beginning yesterday. Fatty intracranial mass lesion demonstrated on CT. EXAM: MRI HEAD WITHOUT AND WITH CONTRAST TECHNIQUE: Multiplanar, multiecho pulse sequences of the brain and surrounding structures were obtained without and with intravenous contrast. CONTRAST:  90mL MULTIHANCE GADOBENATE DIMEGLUMINE 529 MG/ML IV SOLN COMPARISON:  CT 04/15/2016 FINDINGS: Brain: Diffusion imaging does not show any acute or subacute infarction. There are mild chronic small-vessel ischemic changes affecting the pons. No focal cerebellar insult. Cerebral hemispheres show mild chronic small-vessel ischemic changes  within the deep and subcortical white matter. No cortical or large vessel territory infarction. No hydrocephalus or extra-axial collection. Within the inferior frontal lobe on the left, there is a bilobed fat lesion with peripheral calcification on CT with the 2 lobes measuring 23 x 18 mm and 13 x 8 mm. Neither show any contrast enhancement. There is no brain edema or shift. The larger component may even be extra-axial. Vascular: Major vessels at the base of the brain show flow. Skull and upper cervical spine: Normal Sinuses/Orbits: Minimal mucosal inflammation. Small mastoid effusions. Other: None significant IMPRESSION: No acute intracranial finding. Mild chronic small-vessel ischemic change affecting the pons and cerebral hemispheric white matter. Bilobed fat lesion in the inferior frontal region on the left with peripheral calcification by CT. This could be mixed extra and intra-axial. 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 These are probably incidental and insignificant clinically. Electronically Signed   By: Nelson Chimes M.D.    On: 04/17/2016 10:50   Dg Chest Portable 1 View  Result Date: 04/15/2016 CLINICAL DATA:  Seizure and altered mental status EXAM: PORTABLE CHEST 1 VIEW COMPARISON:  Chest radiograph Nov 27, 2015; chest CT Nov 28, 2015 FINDINGS: There is no edema or consolidation. There is cardiomegaly with pulmonary vascularity within normal limits. Aortic atherosclerosis noted. No adenopathy. No bone lesions. No pneumothorax. IMPRESSION: Stable cardiomegaly. Aortic atherosclerosis. No edema or consolidation. Electronically Signed   By: Lowella Grip III M.D.   On: 04/15/2016 17:46   Ct Head Code Stroke W/o Cm  Result Date: 04/15/2016 CLINICAL DATA:  Code stroke. Patient not answering questions or moving extremities. EXAM: CT HEAD WITHOUT CONTRAST TECHNIQUE: Contiguous axial images were obtained from the base of the skull through the vertex without intravenous contrast. COMPARISON:  None. FINDINGS: Brain: Multilobulated fat attenuating lesion within the left anterior paramedian frontal lobe with the larger lower component measuring 23 x 18 x 18 mm (AP x ML x CC), series 201, image 13 and series 203, image 23. The lesion demonstrates marginal calcifications. No large acute territory infarct, intracranial hemorrhage, hydrocephalus, or extra-axial collection is identified. Mild chronic microvascular ischemic changes and parenchymal volume loss. Vascular: No hyperdense vessel or unexpected calcification. Skull: Normal. Negative for fracture or focal lesion. Sinuses/Orbits: No acute finding. Under pneumatized left mastoid air cells with increased density compatible with sequelae of chronic otomastoiditis. Bilateral intra-ocular lens replacement. Other: None. ASPECTS [facility] Stroke Program Early CT Score) - Ganglionic level infarction (caudate, lentiform nuclei, internal capsule, insula, M1-M3 cortex): 7 - Supraganglionic infarction (M4-M6 cortex): 3 Total score (0-10 with 10 being normal): 10 IMPRESSION: 1. No acute  intracranial abnormality is identified 2. ASPECTS is 10 3. 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 MRI with contrast is recommended for further characterization. These results were called by telephone at the time of interpretation on 04/15/2016 at 5:02 pm to Dr. Gifford Shave, who verbally acknowledged these results. Electronically Signed   By: Kristine Garbe M.D.   On: 04/15/2016 17:06        Scheduled Meds: . enoxaparin (LOVENOX) injection  40 mg Subcutaneous Daily  . famotidine (PEPCID) IV  20 mg Intravenous Q12H  . Influenza vac split quadrivalent PF  0.5 mL Intramuscular Tomorrow-1000  . insulin aspart  0-9 Units Subcutaneous TID WC  . insulin glargine  20 Units Subcutaneous Daily  . mouth rinse  15 mL Mouth Rinse BID   Continuous Infusions: .  sodium chloride 75 mL/hr at 04/16/16 0809     LOS: 2 days    Time spent: 53min    Domenic Polite, MD [facility] Pager [phone]  If 7PM-7AM, please contact night-coverage [email or web address] Password [email or web address] 04/17/2016, 12:03 PM

## 2016-04-17 NOTE — Progress Notes (Signed)
Received order to d/c cardiac monitoring so pt can transfer to med-surg. Consuelo Pandy RN

## 2016-04-17 NOTE — Care Management Note (Signed)
Case Management Note  Patient Details  Name: Sharon [name] MRN: [id number] Date of Birth: 07-28-1945  Subjective/Objective:   Presents with AMS, BS greater than 1000, she lives alone, she has pcp Sharon [name], has transport at discharge,  Has a rolling walker and she has access to bsc and w/chair.  She is having trouble remembering when she came in and what happened, she does not take her medications like she should she says.  NCM will await pt eval.                   Action/Plan:   Expected Discharge Date:  04/18/16               Expected Discharge Plan:  Sharon [name]  In-House Referral:     Discharge planning Services  CM Consult  Post Acute Care Choice:    Choice offered to:     DME Arranged:    DME Agency:     HH Arranged:    HH Agency:     Status of Service:  In process, will continue to follow  If discussed at Long Length of Stay Meetings, dates discussed:    Additional Comments:  Sharon Mayo, RN 04/17/2016, 3:56 PM

## 2016-04-18 LAB — HEMOGLOBIN A1C
HEMOGLOBIN A1C: 14.7 % — AB (ref 4.8–5.6)
MEAN PLASMA GLUCOSE: 375 mg/dL

## 2016-04-18 LAB — GLUCOSE, CAPILLARY
Glucose-Capillary: 223 mg/dL — ABNORMAL HIGH (ref 65–99)
Glucose-Capillary: 393 mg/dL — ABNORMAL HIGH (ref 65–99)

## 2016-04-18 MED ORDER — TRESIBA FLEXTOUCH 200 UNIT/ML ~~LOC~~ SOPN: 80.0000 [IU] | PEN_INJECTOR | Freq: Every day | SUBCUTANEOUS | 2 refills | Status: DC

## 2016-04-18 MED ORDER — FUROSEMIDE 40 MG PO TABS: 40.0000 mg | ORAL_TABLET | Freq: Every day | ORAL | Status: DC

## 2016-04-18 MED ORDER — FAMOTIDINE 20 MG PO TABS: 20.0000 mg | ORAL_TABLET | Freq: Two times a day (BID) | ORAL | Status: DC

## 2016-04-18 NOTE — Progress Notes (Addendum)
Note plans for patient to be discharged home today.  Discussed with Dr. Broadus John.  Patient received 35 units of Lantus yesterday on 04/17/16.  Her home dose of Tyler Aas is 120 units daily.  MD states she will decrease home dose of Tresiba to 80 units daily and have patient follow-up with PCP/endocrinologist asap.    Thanks, Adah Perl, RN, BC-ADM Inpatient Diabetes Coordinator Pager [phone] (8a-5p)

## 2016-04-18 NOTE — Progress Notes (Signed)
PT Cancellation Note  Patient Details Name: Sharon [name] MRN: [id number] DOB: 11/11/1945   Cancelled Treatment:    Reason Eval/Treat Not Completed: PT screened, no needs identified, will sign off. Pt seen by OT and is mobilizing adequately.    Darby Shadwick 04/18/2016, 1:17 PM [employer] [place]

## 2016-04-18 NOTE — Evaluation (Signed)
Occupational Therapy Evaluation Patient Details Name: Sharon [name] MRN: [id number] DOB: [birth year] Today's Date: 04/18/2016    History of Present Illness This 70 y.o. female admitted with MS changes, aphasia, decreased ability to follow commands.  Transported to ED.  CT of head negative for stroke.  Blood glucose level >1000 with mild hyponatremia and acidosis.  Pt with seizure in ED.  PMH includes:  HTN, DM, CHF, asthma, anxiety  Patient evaluated by Occupational Therapy with no further acute OT needs identified. All education has been completed and the patient has no further questions. Pt is at baseline.   See below for any follow-up Occupational Therapy or equipment needs. OT is signing off. Thank you for this referral.     Follow Up Recommendations  No OT follow up;Supervision - Intermittent    Equipment Recommendations  None recommended by OT    Recommendations for Other Services       Precautions / Restrictions Precautions Precautions: None      Mobility Bed Mobility Overal bed mobility: Independent                Transfers Overall transfer level: Modified independent                    Balance Overall balance assessment: Modified Independent                                          ADL Overall ADL's : Modified independent Eating/Feeding: Independent                             Toileting - Clothing Manipulation Details (indicate cue type and reason): Pt incontinent large amount of urine which she reports is her normal    Tub/Shower Transfer Details (indicate cue type and reason): simulated tub transfer using grab bars - mod I  Functional mobility during ADLs: Modified independent General ADL Comments: Pt with LOB when attempting to doff socks while standing on one foot.  On futher discussion, she does not attempt to stand on one foot.  No other LOB noted.  She has AE for LB ADLs      Vision     Perception      Praxis      Pertinent Vitals/Pain Pain Assessment: No/denies pain     Hand Dominance Right   Extremity/Trunk Assessment Upper Extremity Assessment Upper Extremity Assessment: Overall WFL for tasks assessed   Lower Extremity Assessment Lower Extremity Assessment: Overall WFL for tasks assessed   Cervical / Trunk Assessment Cervical / Trunk Assessment: Normal   Communication Communication Communication: No difficulties   Cognition Arousal/Alertness: Awake/alert Behavior During Therapy: WFL for tasks assessed/performed Overall Cognitive Status: Within Functional Limits for tasks assessed                     General Comments       Exercises       Shoulder Instructions      Home Living Family/patient expects to be discharged to:: Private residence Living Arrangements: Alone Available Help at Discharge: Family;Available PRN/intermittently Type of Home: Apartment Home Access: Level entry     Home Layout: One level     Bathroom Shower/Tub: Tub/shower unit;Curtain Shower/tub characteristics: Curtain   Bathroom Accessibility: Yes How Accessible: Accessible via wheelchair;Accessible via walker Home Equipment: Grab bars - tub/shower;Grab bars -  toilet;Cane - quad;Walker - 2 wheels;Adaptive equipment Adaptive Equipment: Reacher;Long-handled sponge        Prior Functioning/Environment Level of Independence: Independent        Comments: 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  She denies falls.  Reports she works out with trainer 2x/wk         OT Problem List: Decreased strength   OT Treatment/Interventions:      OT Goals(Current goals can be found in the care plan section) Acute Rehab OT Goals OT Goal Formulation: All assessment and education complete, DC therapy  OT Frequency:     Barriers to D/C:            Co-evaluation              End of Session Nurse Communication: Mobility status  Activity Tolerance: Patient tolerated treatment  well Patient left: in chair;with call bell/phone within reach   Time: 1126-1221 OT Time Calculation (min): 55 min Charges:  OT General Charges $OT Visit: 1 Procedure OT Evaluation $OT Eval Moderate Complexity: 1 Procedure OT Treatments $Self Care/Home Management : 38-52 mins G-Codes:    Sharon [name] M 2016/05/16, 11:16 PM

## 2016-04-18 NOTE — Care Management Note (Signed)
Case Management Note  Patient Details  Name: Sharon [name] MRN: [id number] Date of Birth: 09-13-1945  Subjective/Objective:                 Patient admitted from home for hyperglycemia. CBG > 1000 on admission with AMS. Transferred from [facility]. Will DC today. CM spoke with patient. She states that CM from [facility] has taken care of her medication needs and she will not have any difficulties getting her medications at DC. 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 Patient states that she still drives and is independent at home. Patient declines any needs for [facility] supportive care or PT. Patient she states that she has all DME including working blood meter and supplies at home.    Action/Plan:  No further CM needs at this time.  Expected Discharge Date:  04/18/16               Expected Discharge Plan:  [place]  In-House Referral:  NA  Discharge planning Services  CM Consult  Post Acute Care Choice:  NA Choice offered to:  NA  DME Arranged:  N/A DME Agency:  NA  HH Arranged:  NA HH Agency:  NA  Status of Service:  Completed, signed off  If discussed at [place] of Stay Meetings, dates discussed:    Additional Comments:  Carles Collet, RN 04/18/2016, 11:32 AM

## 2016-04-18 NOTE — Progress Notes (Signed)
Pt BP elevated, MD notified at [phone].

## 2016-04-18 NOTE — Care Management Important Message (Signed)
Important Message  Patient Details  Name: Sharon [name] MRN: [id number] Date of Birth: 02/04/1946   Medicare Important Message Given:  Yes    Taden Witter Abena 04/18/2016, 1:17 PM

## 2016-04-18 NOTE — Progress Notes (Signed)
Discharge instruction given to patient and patient verbalized understanding using teach back. IV remaoved.

## 2016-04-19 NOTE — Discharge Summary (Addendum)
Physician Discharge Summary  Sharon [name] [id number] DOB: August 17, 1945 DOA: 04/15/2016  PCP: Osborne Casco, MD  Admit date: 04/15/2016 Discharge date: 04/18/2016  Time spent: 45 minutes  Recommendations for Outpatient Follow-up:  1. PCP Dr.Griffin in 1 week, will need Tresiba dose adjusted at follow up 2. No driving, operating heavy machinery, perform activities at heights, swimming or participation in water activities until release by outpatient physician.    Discharge Diagnoses:    Hyperosmolar nonketotic hyperglycemia   Hypertension   Morbid obesity ([place])   Hyperosmolar (nonketotic) coma ([facility])   Seizure ([place])   AKI (acute kidney injury) ([place])   Chronic diastolic heart failure [facility])   Discharge Condition: stable  Diet recommendation: diabetic  Filed Weights   04/16/16 0500 04/17/16 0419 04/18/16 0603  Weight: 113.3 kg (249 lb 12.5 oz) 113.5 kg (250 lb 3.6 oz) 113.7 kg (250 lb 10.6 oz)    History of present illness:  Dahliah Petersonis a 70 y.o.woman with a history of HTN, DM, chronic diastolic heart failure, and diverticular disease who seemed to be in her baseline state of health when she presented to work 10/3 morning (she is an Therapist, sports for [employer]). After seeing her second patient, colleagues noticed mental status changes. She was aphasic and would not follow commands. She was referred to the ED for evaluation; Code Stroke was called. She was evaluated by neurology in the ED. 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 MRI imaging recommended. The patient was also found to have a markedly elevated blood sugar, serum glucose greater than 1000, with mild hyponatremia and acidosis. She also had a witnessed seizure in the ED; thought to be related to hyperglycemia  Hospital Course:  Hyperosmolar nonketotic hyperglycemia -improved, off insulin gtt and IVF -she ran  out of [place] on Friday prior to admission -requiring much lower doses on Insulin inpatient and hence discharged her on tresiba of 80units instead of 120 she takes at home -will need dose adjustment at follow up -Hbaic was 14  Metabolic encephalopathy -due to HONK, seizure, and meds-got multiple doses of ativan, haldol, keppra night of admission -improved  Seizure -Likely metabolic secondary to profound hyperglycemia -was started on Keppra by admitting MD after seizure in ER , MRI notable for a lipoma only, stopped keppra after discussion with Neurology, -EEG was normal,  advised about No driving, operating heavy machinery, perform activities at heights, swimming or participation in water activities until release by outpatient physician.  Chronic diastolic heart failure -compensated , resumed PO lasix  Discharge Exam: Vitals:   04/18/16 0523 04/18/16 0603  BP: (!) 168/64 (!) 164/68  Pulse: 64 61  Resp: 15   Temp: 98.3 F (36.8 C)     General: AAOx3 Cardiovascular: S1S2/RRR Respiratory: CTAB  Discharge Instructions   Discharge Instructions    Diet - low sodium heart healthy    Complete by:  As directed    Diet Carb Modified    Complete by:  As directed    Increase activity slowly    Complete by:  As directed      Discharge Medication List as of 04/18/2016  1:43 PM    CONTINUE these medications which have CHANGED   Details  furosemide (LASIX) 40 MG tablet Take 1 tablet (40 mg total) by mouth daily., Starting Fri 04/18/2016, No Print    TRESIBA FLEXTOUCH 200 UNIT/ML SOPN Inject 80 Units into the skin daily.  Will need further dose adjustment at Follow up, Starting Fri 04/18/2016, Normal      CONTINUE these medications which have NOT CHANGED   Details  aspirin EC 81 MG tablet Take 81 mg by mouth daily.  , Historical Med    beta carotene w/minerals (OCUVITE) tablet Take 1 tablet by mouth daily., Historical Med    Calcium Carbonate-Vitamin D (CALTRATE 600+D)  600-400 MG-UNIT per tablet Take 1 tablet by mouth 2 (two) times daily. , Historical Med    dexlansoprazole (DEXILANT) 60 MG capsule Take 60 mg by mouth daily.  , Historical Med    gabapentin (NEURONTIN) 300 MG capsule Take 600 mg by mouth 2 (two) times daily. , Historical Med    ibuprofen (ADVIL,MOTRIN) 200 MG tablet Take 400-600 mg by mouth every 6 (six) hours as needed for headache (or pain). , Historical Med    loratadine (CLARITIN) 10 MG tablet Take 10 mg by mouth daily.  , Historical Med    metoprolol succinate (TOPROL-XL) 25 MG 24 hr tablet TAKE ONE TABLET BY MOUTH ONCE DAILY, Normal    ondansetron (ZOFRAN) 4 MG tablet Take 4-8 mg by mouth every 8 (eight) hours as needed for nausea or vomiting. , Starting Mon 12020/07/2813, Historical Med    PARoxetine (PAXIL) 40 MG tablet Take 60 mg by mouth every morning. , Historical Med    polyethylene glycol (MIRALAX / GLYCOLAX) packet Take 17 g by mouth daily as needed for mild constipation., Historical Med    simvastatin (ZOCOR) 40 MG tablet Take 40 mg by mouth daily. , Historical Med      STOP taking these medications     lisinopril-hydrochlorothiazide (PRINZIDE,ZESTORETIC) 20-12.5 MG tablet      meloxicam (MOBIC) 15 MG tablet        Allergies  Allergen Reactions  . Crestor [Rosuvastatin] Other (See Comments)    Myalgias  . Indomethacin Other (See Comments)    dizziness  . Invanz [Ertapenem Sodium] Hives  . Lipitor [Atorvastatin Calcium] Other (See Comments)    Myalgias   . Reglan [Metoclopramide] Other (See Comments)    TREMORS      The results of significant diagnostics from this hospitalization (including imaging, microbiology, ancillary and laboratory) are listed below for reference.    Significant Diagnostic Studies: Mr Jeri Cos Wo Contrast  Result Date: 04/17/2016 CLINICAL DATA:  Acute onset of aphasia and confusion beginning yesterday. Fatty intracranial mass lesion demonstrated on CT. EXAM: MRI HEAD WITHOUT AND WITH  CONTRAST TECHNIQUE: Multiplanar, multiecho pulse sequences of the brain and surrounding structures were obtained without and with intravenous contrast. CONTRAST:  22mL MULTIHANCE GADOBENATE DIMEGLUMINE 529 MG/ML IV SOLN COMPARISON:  CT 04/15/2016 FINDINGS: Brain: Diffusion imaging does not show any acute or subacute infarction. There are mild chronic small-vessel ischemic changes affecting the pons. No focal cerebellar insult. Cerebral hemispheres show mild chronic small-vessel ischemic changes within the deep and subcortical white matter. No cortical or large vessel territory infarction. No hydrocephalus or extra-axial collection. Within the inferior frontal lobe on the left, there is a bilobed fat lesion with peripheral calcification on CT with the 2 lobes measuring 23 x 18 mm and 13 x 8 mm. Neither show any contrast enhancement. There is no brain edema or shift. The larger component may even be extra-axial. Vascular: Major vessels at the base of the brain show flow. Skull and upper cervical spine: Normal Sinuses/Orbits: Minimal mucosal inflammation. Small mastoid effusions. Other: None significant IMPRESSION: No acute intracranial finding. Mild chronic small-vessel ischemic change  affecting the pons and cerebral hemispheric white matter. Bilobed fat lesion in the inferior frontal region on the left with peripheral calcification by CT. This could be mixed extra and intra-axial. 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 These are probably incidental and insignificant clinically. Electronically Signed   By: Nelson Chimes M.D.   On: 04/17/2016 10:50   Dg Chest Portable 1 View  Result Date: 04/15/2016 CLINICAL DATA:  Seizure and altered mental status EXAM: PORTABLE CHEST 1 VIEW COMPARISON:  Chest radiograph Nov 27, 2015; chest CT Nov 28, 2015 FINDINGS: There is no edema or consolidation. There is cardiomegaly with pulmonary vascularity within normal limits. Aortic atherosclerosis noted. No adenopathy. No  bone lesions. No pneumothorax. IMPRESSION: Stable cardiomegaly. Aortic atherosclerosis. No edema or consolidation. Electronically Signed   By: Lowella Grip III M.D.   On: 04/15/2016 17:46   Ct Head Code Stroke W/o Cm  Result Date: 04/15/2016 CLINICAL DATA:  Code stroke. Patient not answering questions or moving extremities. EXAM: CT HEAD WITHOUT CONTRAST TECHNIQUE: Contiguous axial images were obtained from the base of the skull through the vertex without intravenous contrast. COMPARISON:  None. FINDINGS: Brain: Multilobulated fat attenuating lesion within the left anterior paramedian frontal lobe with the larger lower component measuring 23 x 18 x 18 mm (AP x ML x CC), series 201, image 13 and series 203, image 23. The lesion demonstrates marginal calcifications. No large acute territory infarct, intracranial hemorrhage, hydrocephalus, or extra-axial collection is identified. Mild chronic microvascular ischemic changes and parenchymal volume loss. Vascular: No hyperdense vessel or unexpected calcification. Skull: Normal. Negative for fracture or focal lesion. Sinuses/Orbits: No acute finding. Under pneumatized left mastoid air cells with increased density compatible with sequelae of chronic otomastoiditis. Bilateral intra-ocular lens replacement. Other: None. ASPECTS [facility] Stroke Program Early CT Score) - Ganglionic level infarction (caudate, lentiform nuclei, internal capsule, insula, M1-M3 cortex): 7 - Supraganglionic infarction (M4-M6 cortex): 3 Total score (0-10 with 10 being normal): 10 IMPRESSION: 1. No acute intracranial abnormality is identified 2. ASPECTS is 10 3. 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 MRI with contrast is recommended for further characterization. These results were called by telephone at the time of interpretation on 04/15/2016 at 5:02 pm to Dr. Gifford Shave, who  verbally acknowledged these results. Electronically Signed   By: Kristine Garbe M.D.   On: 04/15/2016 17:06    Microbiology: Recent Results (from the past 240 hour(s))  MRSA PCR Screening     Status: None   Collection Time: 04/15/16 11:48 PM  Result Value Ref Range Status   MRSA by PCR NEGATIVE NEGATIVE Final    Comment:        The GeneXpert MRSA Assay (FDA approved for NASAL specimens only), is one component of a comprehensive MRSA colonization surveillance program. It is not intended to diagnose MRSA infection nor to guide or monitor treatment for MRSA infections.      Labs: Basic Metabolic Panel:  Recent Labs Lab 04/15/16 1655 04/15/16 1707 04/16/16 0232 04/16/16 0546 04/16/16 0945 04/17/16 0010  NA 126* 128* 139 139 140 138  K 4.7 4.7 3.7 3.4* 3.6 3.8  CL 97* 96* 109 112* 111 109  CO2 19*  --  21* 21* 20* 20*  GLUCOSE 1,028* >700* 131* 158* 170* 255*  BUN 25* 27* 19 18 16 10   CREATININE 1.25* 1.00 0.83 0.80 0.76 0.73  CALCIUM 9.1  --  9.1 8.9  8.8* 8.7*   Liver Function Tests:  Recent Labs Lab 04/15/16 1655  AST 11*  ALT 10*  ALKPHOS 113  BILITOT 0.6  PROT 6.9  ALBUMIN 3.6   No results for input(s): LIPASE, AMYLASE in the last 168 hours. No results for input(s): AMMONIA in the last 168 hours. CBC:  Recent Labs Lab 04/15/16 1655 04/15/16 1707 04/17/16 0010  WBC 7.5  --  8.9  NEUTROABS 6.0  --   --   HGB 11.2* 12.6 10.3*  HCT 35.2* 37.0 32.6*  MCV 90.5  --  88.6  PLT 242  --  217   Cardiac Enzymes:  Recent Labs Lab 04/15/16 0001 04/16/16 0232 04/16/16 0945  TROPONINI <0.03 <0.03 <0.03   BNP: BNP (last 3 results) No results for input(s): BNP in the last 8760 hours.  ProBNP (last 3 results) No results for input(s): PROBNP in the last 8760 hours.  CBG:  Recent Labs Lab 04/17/16 1141 04/17/16 1634 04/17/16 2100 04/18/16 0812 04/18/16 1223  GLUCAP 252* 343* 283* 223* 393*       SignedDomenic Polite MD.   [facility] 04/19/2016, 3:07 PM

## 2016-04-21 ENCOUNTER — Ambulatory Visit: Payer: BLUE CROSS/BLUE SHIELD

## 2016-04-21 DIAGNOSIS — F329 Major depressive disorder, single episode, unspecified: Secondary | ICD-10-CM | POA: Diagnosis not present

## 2016-04-21 DIAGNOSIS — Z7984 Long term (current) use of oral hypoglycemic drugs: Secondary | ICD-10-CM | POA: Diagnosis not present

## 2016-04-21 DIAGNOSIS — R609 Edema, unspecified: Secondary | ICD-10-CM | POA: Diagnosis not present

## 2016-04-21 DIAGNOSIS — Z794 Long term (current) use of insulin: Secondary | ICD-10-CM | POA: Diagnosis not present

## 2016-04-21 DIAGNOSIS — E1142 Type 2 diabetes mellitus with diabetic polyneuropathy: Secondary | ICD-10-CM | POA: Diagnosis not present

## 2016-04-21 DIAGNOSIS — E1165 Type 2 diabetes mellitus with hyperglycemia: Secondary | ICD-10-CM | POA: Diagnosis not present

## 2016-04-28 DIAGNOSIS — F329 Major depressive disorder, single episode, unspecified: Secondary | ICD-10-CM | POA: Diagnosis not present

## 2016-04-28 DIAGNOSIS — R609 Edema, unspecified: Secondary | ICD-10-CM | POA: Diagnosis not present

## 2016-04-28 DIAGNOSIS — E1142 Type 2 diabetes mellitus with diabetic polyneuropathy: Secondary | ICD-10-CM | POA: Diagnosis not present

## 2016-04-28 DIAGNOSIS — Z794 Long term (current) use of insulin: Secondary | ICD-10-CM | POA: Diagnosis not present

## 2016-04-28 DIAGNOSIS — E1165 Type 2 diabetes mellitus with hyperglycemia: Secondary | ICD-10-CM | POA: Diagnosis not present

## 2016-05-06 ENCOUNTER — Ambulatory Visit (HOSPITAL_COMMUNITY): Payer: No Typology Code available for payment source | Attending: Cardiology

## 2016-05-12 DIAGNOSIS — Z6841 Body Mass Index (BMI) 40.0 and over, adult: Secondary | ICD-10-CM | POA: Diagnosis not present

## 2016-05-12 DIAGNOSIS — E1142 Type 2 diabetes mellitus with diabetic polyneuropathy: Secondary | ICD-10-CM | POA: Diagnosis not present

## 2016-05-12 DIAGNOSIS — Z794 Long term (current) use of insulin: Secondary | ICD-10-CM | POA: Diagnosis not present

## 2016-05-14 ENCOUNTER — Ambulatory Visit (HOSPITAL_COMMUNITY): Payer: No Typology Code available for payment source | Attending: Cardiovascular Disease

## 2016-05-14 ENCOUNTER — Other Ambulatory Visit: Payer: Self-pay

## 2016-05-14 DIAGNOSIS — I110 Hypertensive heart disease with heart failure: Secondary | ICD-10-CM | POA: Diagnosis not present

## 2016-05-14 DIAGNOSIS — Z6841 Body Mass Index (BMI) 40.0 and over, adult: Secondary | ICD-10-CM | POA: Diagnosis not present

## 2016-05-14 DIAGNOSIS — E119 Type 2 diabetes mellitus without complications: Secondary | ICD-10-CM | POA: Insufficient documentation

## 2016-05-14 DIAGNOSIS — Z8249 Family history of ischemic heart disease and other diseases of the circulatory system: Secondary | ICD-10-CM | POA: Insufficient documentation

## 2016-05-14 DIAGNOSIS — I509 Heart failure, unspecified: Secondary | ICD-10-CM | POA: Insufficient documentation

## 2016-05-14 DIAGNOSIS — J45909 Unspecified asthma, uncomplicated: Secondary | ICD-10-CM | POA: Diagnosis not present

## 2016-05-14 DIAGNOSIS — E669 Obesity, unspecified: Secondary | ICD-10-CM | POA: Insufficient documentation

## 2016-05-14 DIAGNOSIS — E785 Hyperlipidemia, unspecified: Secondary | ICD-10-CM | POA: Insufficient documentation

## 2016-05-14 DIAGNOSIS — I491 Atrial premature depolarization: Secondary | ICD-10-CM | POA: Diagnosis not present

## 2016-05-14 DIAGNOSIS — I350 Nonrheumatic aortic (valve) stenosis: Secondary | ICD-10-CM | POA: Insufficient documentation

## 2016-05-19 ENCOUNTER — Telehealth: Payer: Self-pay | Admitting: Cardiology

## 2016-05-19 NOTE — Telephone Encounter (Signed)
I called pt and left ECHO results on voicemail at her request.

## 2016-05-19 NOTE — Telephone Encounter (Signed)
New Message  Pt voiced she was calling about results and it's okay to leave results on home vm.  Please f/u with pt

## 2016-05-23 ENCOUNTER — Other Ambulatory Visit: Payer: Self-pay | Admitting: Gastroenterology

## 2016-05-23 DIAGNOSIS — R198 Other specified symptoms and signs involving the digestive system and abdomen: Secondary | ICD-10-CM

## 2016-06-07 ENCOUNTER — Other Ambulatory Visit: Payer: No Typology Code available for payment source

## 2016-07-17 DIAGNOSIS — L281 Prurigo nodularis: Secondary | ICD-10-CM | POA: Diagnosis not present

## 2016-07-17 DIAGNOSIS — D485 Neoplasm of uncertain behavior of skin: Secondary | ICD-10-CM | POA: Diagnosis not present

## 2016-07-17 DIAGNOSIS — D235 Other benign neoplasm of skin of trunk: Secondary | ICD-10-CM | POA: Diagnosis not present

## 2016-07-17 DIAGNOSIS — L814 Other melanin hyperpigmentation: Secondary | ICD-10-CM | POA: Diagnosis not present

## 2016-07-17 DIAGNOSIS — L821 Other seborrheic keratosis: Secondary | ICD-10-CM | POA: Diagnosis not present

## 2016-07-17 DIAGNOSIS — D225 Melanocytic nevi of trunk: Secondary | ICD-10-CM | POA: Diagnosis not present

## 2016-08-06 ENCOUNTER — Ambulatory Visit
Admission: RE | Admit: 2016-08-06 | Discharge: 2016-08-06 | Disposition: A | Discharge status: Home / Self Care | Payer: Medicare Other | Source: Ambulatory Visit | Attending: Family Medicine | Admitting: Family Medicine

## 2016-08-06 DIAGNOSIS — Z1231 Encounter for screening mammogram for malignant neoplasm of breast: Secondary | ICD-10-CM | POA: Diagnosis not present

## 2016-08-06 DIAGNOSIS — Z139 Encounter for screening, unspecified: Secondary | ICD-10-CM

## 2016-08-11 DIAGNOSIS — Z6841 Body Mass Index (BMI) 40.0 and over, adult: Secondary | ICD-10-CM | POA: Diagnosis not present

## 2016-08-11 DIAGNOSIS — E1142 Type 2 diabetes mellitus with diabetic polyneuropathy: Secondary | ICD-10-CM | POA: Diagnosis not present

## 2016-08-11 DIAGNOSIS — Z794 Long term (current) use of insulin: Secondary | ICD-10-CM | POA: Diagnosis not present

## 2016-08-18 DIAGNOSIS — E1142 Type 2 diabetes mellitus with diabetic polyneuropathy: Secondary | ICD-10-CM | POA: Diagnosis not present

## 2016-08-18 DIAGNOSIS — I129 Hypertensive chronic kidney disease with stage 1 through stage 4 chronic kidney disease, or unspecified chronic kidney disease: Secondary | ICD-10-CM | POA: Diagnosis not present

## 2016-08-18 DIAGNOSIS — Z0000 Encounter for general adult medical examination without abnormal findings: Secondary | ICD-10-CM | POA: Diagnosis not present

## 2016-08-18 DIAGNOSIS — N183 Chronic kidney disease, stage 3 (moderate): Secondary | ICD-10-CM | POA: Diagnosis not present

## 2016-08-18 DIAGNOSIS — E1121 Type 2 diabetes mellitus with diabetic nephropathy: Secondary | ICD-10-CM | POA: Diagnosis not present

## 2016-08-18 DIAGNOSIS — E669 Obesity, unspecified: Secondary | ICD-10-CM | POA: Diagnosis not present

## 2016-08-18 DIAGNOSIS — I110 Hypertensive heart disease with heart failure: Secondary | ICD-10-CM | POA: Diagnosis not present

## 2016-08-18 DIAGNOSIS — G4733 Obstructive sleep apnea (adult) (pediatric): Secondary | ICD-10-CM | POA: Diagnosis not present

## 2016-08-18 DIAGNOSIS — K210 Gastro-esophageal reflux disease with esophagitis: Secondary | ICD-10-CM | POA: Diagnosis not present

## 2016-08-18 DIAGNOSIS — K3184 Gastroparesis: Secondary | ICD-10-CM | POA: Diagnosis not present

## 2016-08-18 DIAGNOSIS — Z1159 Encounter for screening for other viral diseases: Secondary | ICD-10-CM | POA: Diagnosis not present

## 2016-08-18 DIAGNOSIS — E7800 Pure hypercholesterolemia, unspecified: Secondary | ICD-10-CM | POA: Diagnosis not present

## 2016-08-28 ENCOUNTER — Other Ambulatory Visit: Payer: Self-pay | Admitting: Cardiology

## 2016-10-14 DIAGNOSIS — L905 Scar conditions and fibrosis of skin: Secondary | ICD-10-CM | POA: Diagnosis not present

## 2016-11-13 DIAGNOSIS — E1142 Type 2 diabetes mellitus with diabetic polyneuropathy: Secondary | ICD-10-CM | POA: Diagnosis not present

## 2016-11-13 DIAGNOSIS — Z794 Long term (current) use of insulin: Secondary | ICD-10-CM | POA: Diagnosis not present

## 2016-12-18 ENCOUNTER — Other Ambulatory Visit: Payer: Self-pay | Admitting: Gastroenterology

## 2016-12-18 DIAGNOSIS — R935 Abnormal findings on diagnostic imaging of other abdominal regions, including retroperitoneum: Secondary | ICD-10-CM

## 2016-12-23 DIAGNOSIS — D3131 Benign neoplasm of right choroid: Secondary | ICD-10-CM | POA: Diagnosis not present

## 2016-12-23 DIAGNOSIS — D3132 Benign neoplasm of left choroid: Secondary | ICD-10-CM | POA: Diagnosis not present

## 2016-12-23 DIAGNOSIS — H04123 Dry eye syndrome of bilateral lacrimal glands: Secondary | ICD-10-CM | POA: Diagnosis not present

## 2016-12-23 DIAGNOSIS — Z961 Presence of intraocular lens: Secondary | ICD-10-CM | POA: Diagnosis not present

## 2016-12-23 DIAGNOSIS — E119 Type 2 diabetes mellitus without complications: Secondary | ICD-10-CM | POA: Diagnosis not present

## 2016-12-31 ENCOUNTER — Ambulatory Visit
Admission: RE | Admit: 2016-12-31 | Discharge: 2016-12-31 | Disposition: A | Discharge status: Home / Self Care | Payer: PRIVATE HEALTH INSURANCE | Source: Ambulatory Visit | Attending: Gastroenterology | Admitting: Gastroenterology

## 2016-12-31 DIAGNOSIS — K862 Cyst of pancreas: Secondary | ICD-10-CM | POA: Diagnosis not present

## 2016-12-31 DIAGNOSIS — R935 Abnormal findings on diagnostic imaging of other abdominal regions, including retroperitoneum: Secondary | ICD-10-CM

## 2016-12-31 MED ORDER — GADOBENATE DIMEGLUMINE 529 MG/ML IV SOLN
20.0000 mL | Freq: Once | INTRAVENOUS | Status: AC | PRN
  Administered 2016-12-31: 20 mL via INTRAVENOUS

## 2017-01-15 ENCOUNTER — Other Ambulatory Visit: Payer: Self-pay | Admitting: Family Medicine

## 2017-01-15 ENCOUNTER — Ambulatory Visit
Admission: RE | Admit: 2017-01-15 | Discharge: 2017-01-15 | Disposition: A | Discharge status: Home / Self Care | Payer: Medicare Other | Source: Ambulatory Visit | Attending: Family Medicine | Admitting: Family Medicine

## 2017-01-15 DIAGNOSIS — R6884 Jaw pain: Secondary | ICD-10-CM

## 2017-01-15 DIAGNOSIS — R42 Dizziness and giddiness: Secondary | ICD-10-CM | POA: Diagnosis not present

## 2017-02-22 IMAGING — CT CT ABD-PELV W/ CM
2 of 5 series · 15 of 46 positions shown, 17 images · IV contrast (APPLIED)
Comparison: CT 05/17/2015

CLINICAL DATA: Vomiting, nausea, and diarrhea. Recent diagnosis of
diverticulitis post 7 days of antibiotics. Diverticulitis versus
developed abscess.

EXAM:
CT ABDOMEN AND PELVIS WITH CONTRAST
TECHNIQUE: Multidetector CT imaging of the abdomen and pelvis was performed
using the standard protocol following bolus administration of
intravenous contrast.
CONTRAST:  100mL OMNIPAQUE IOHEXOL 300 MG/ML  SOLN

[Series 2: abd/ pelvis 5.0 i30f 1 · axial · 0.90mm/px · z∈[-817,-367]mm · 12 of 102 slices shown, 14 images]
[im 6/102  soft-tissue]
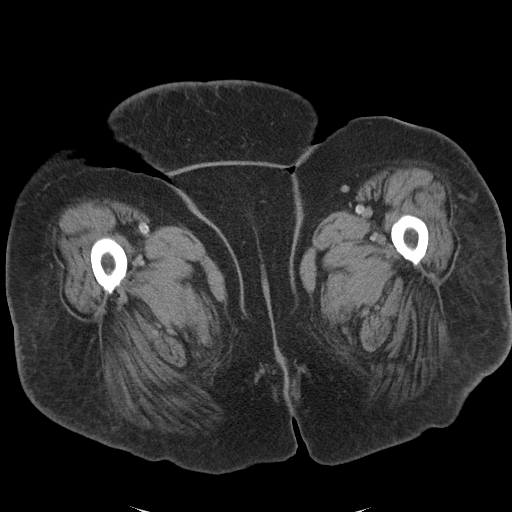
[im 6/102  bone]
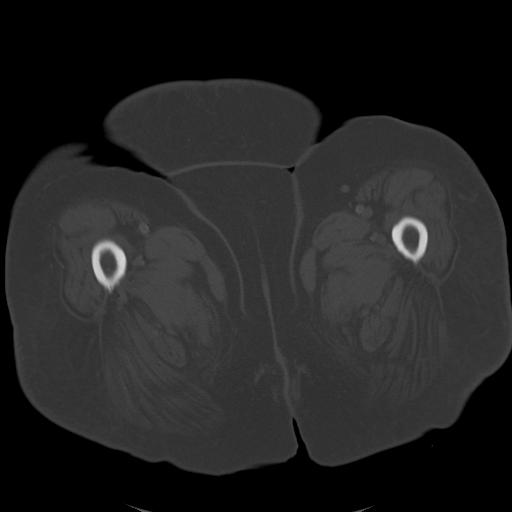
[im 16/102  soft-tissue]
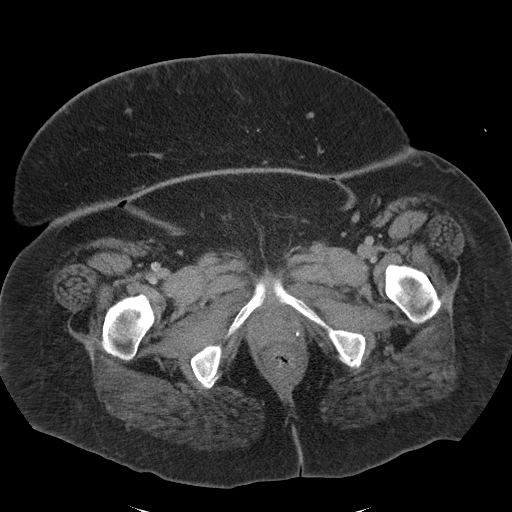
[im 21/102  soft-tissue]
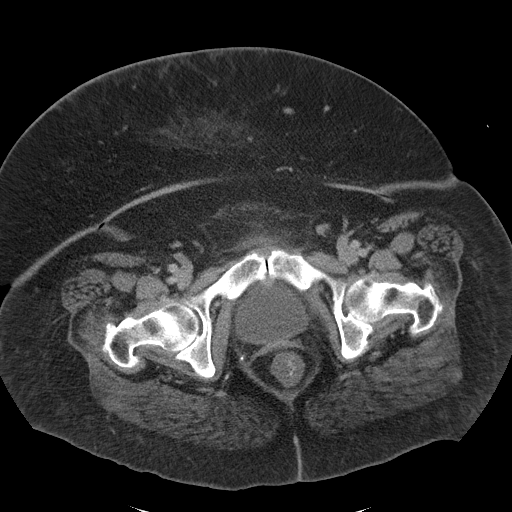
[im 31/102  soft-tissue]
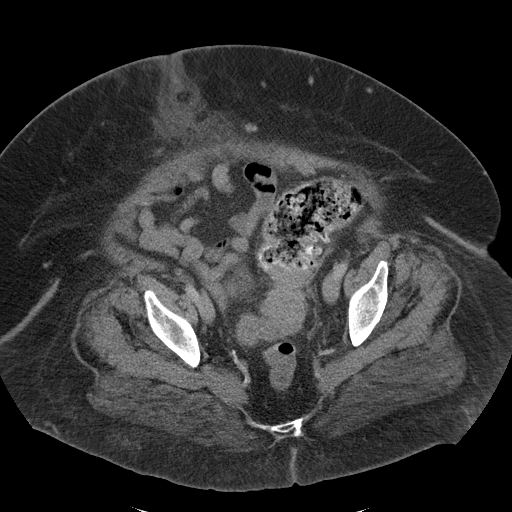
[im 41/102  soft-tissue]
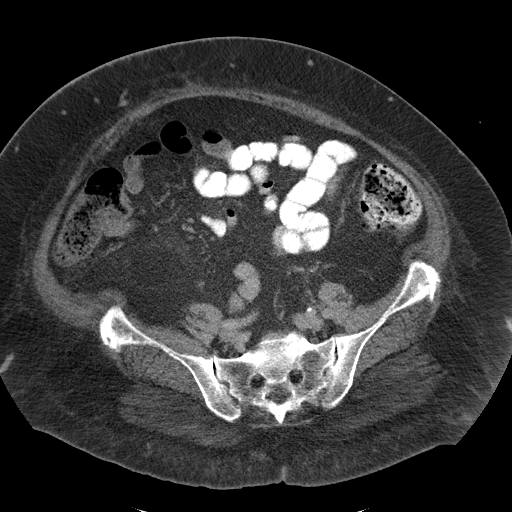
[im 46/102  soft-tissue]
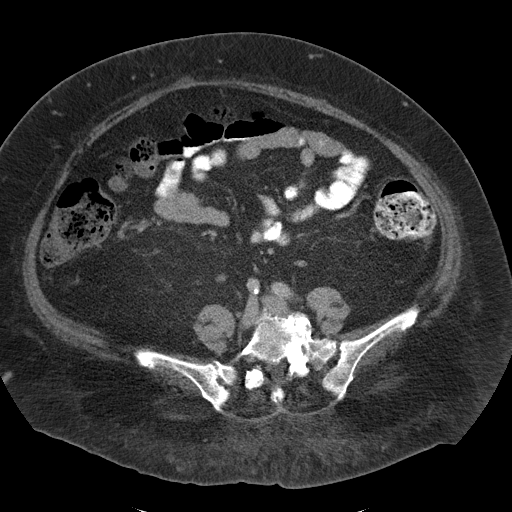
[im 56/102  soft-tissue]
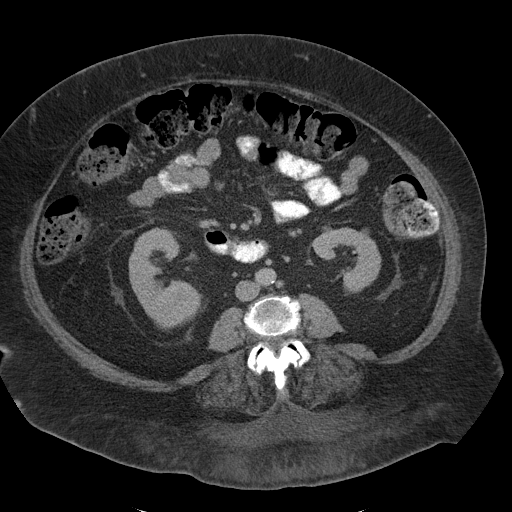
[im 61/102  soft-tissue]
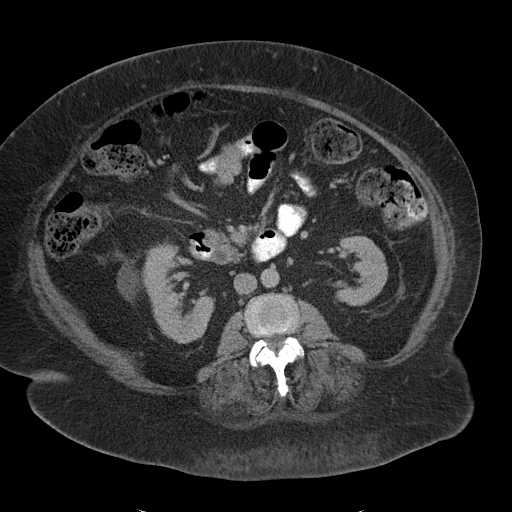
[im 71/102  soft-tissue]
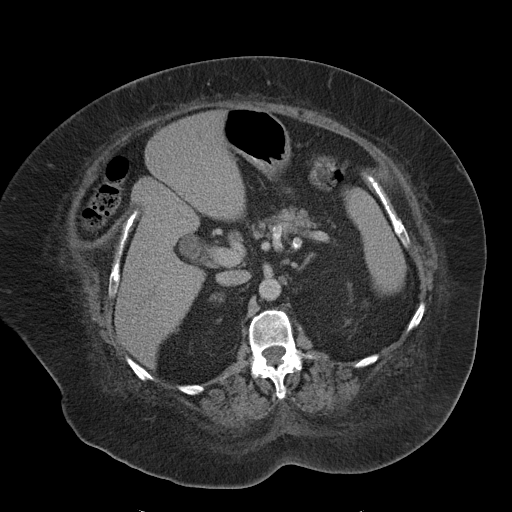
[im 71/102  bone]
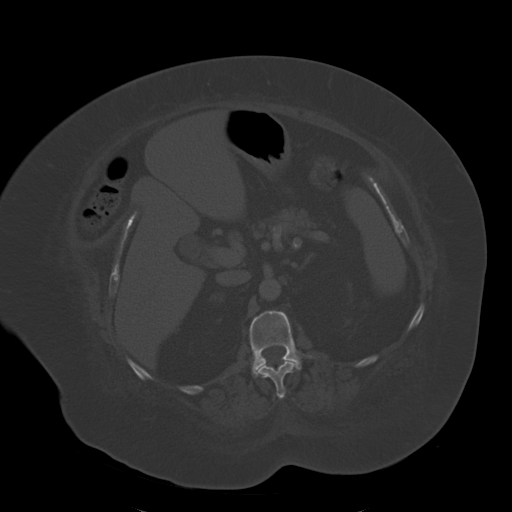
[im 81/102  soft-tissue]
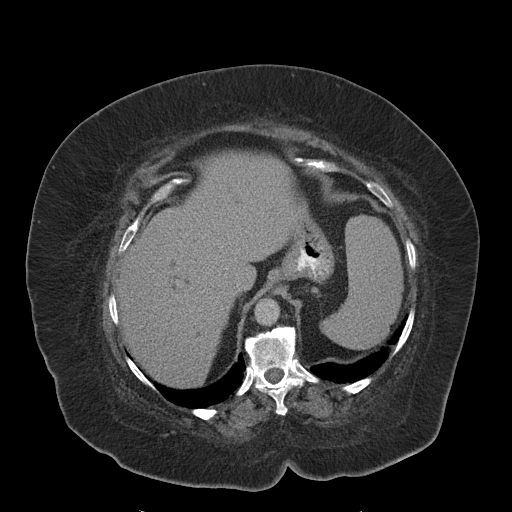
[im 86/102  soft-tissue]
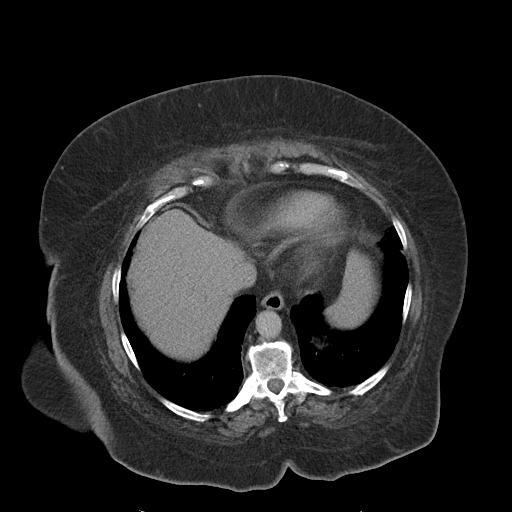
[im 96/102  soft-tissue]
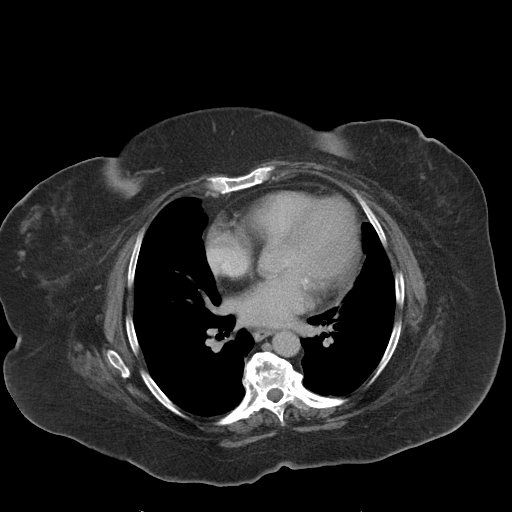

[Series 5: coronal soft tissue · coronal · 0.99mm/px · 3 of 113 slices shown]
[im 38/113  soft-tissue]
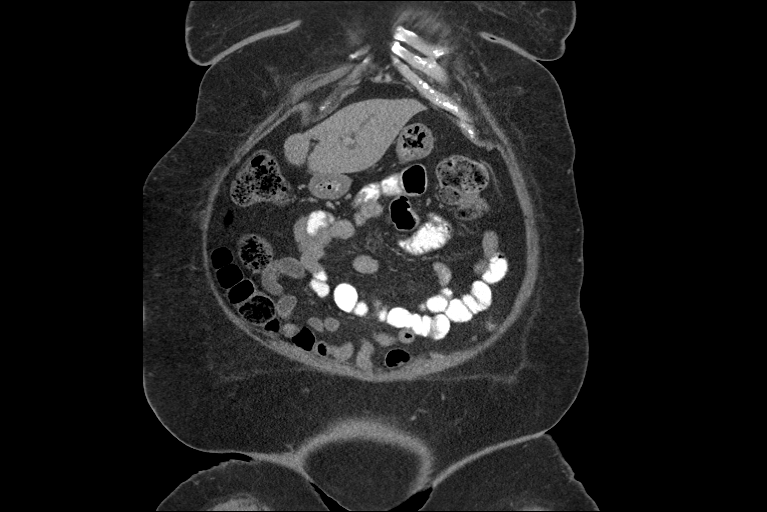
[im 50/113  soft-tissue]
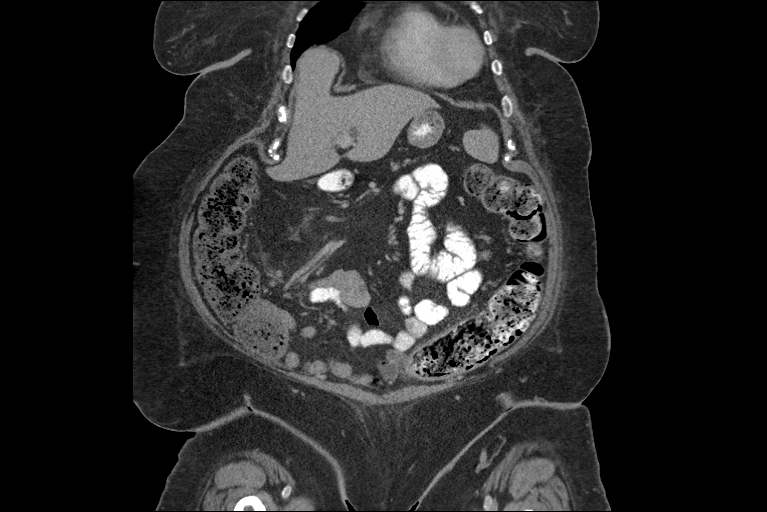
[im 63/113  soft-tissue]
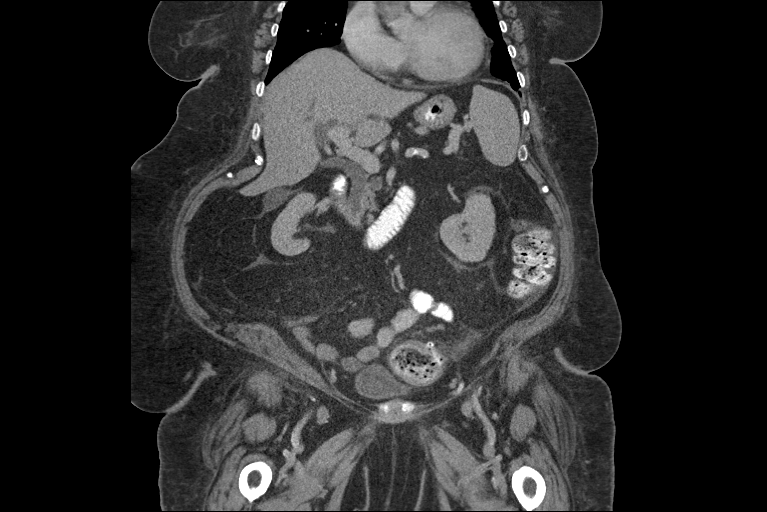

[15 of 46 positions shown; findings below may reference images not displayed]

FINDINGS: Lower chest: Motion artifact through the lung bases. No pleural
effusion or consolidation.

Liver: No focal hepatic lesion.

Hepatobiliary: Postcholecystectomy with stable intra and
extrahepatic biliary prominence.

Pancreas: Mild fatty atrophy of the head and body. No ductal
dilatation or surrounding inflammation.

Spleen: Normal.

Adrenal glands: 1.5 cm right adrenal adenoma unchanged. Left adrenal
gland is normal.

Kidneys: Symmetric renal enhancement. No hydronephrosis. Exophytic
cyst from the upper right kidney measures 4.9 cm. Additional smaller
cortical cysts are seen in both kidneys, unchanged.

Stomach/Bowel: Stomach physiologically distended. There are no
dilated or thickened small bowel loops. Persistent inflammatory
change in involving the sigmoid colon consistent with
diverticulitis, with slight progression in degree of pericolonic fat
stranding. There is no perforation or abscess. Moderate volume of
stool throughout the more proximal colon without proximal colonic
wall thickening. The appendix is surgically absent.

Vascular/Lymphatic: No retroperitoneal adenopathy. The borderline
celiac axis lymph node measuring 8 mm is unchanged. Abdominal aorta
is normal in caliber. Mild atherosclerosis without aneurysm.

Reproductive: Uterus surgically absent.  No adnexal mass.

Bladder: Physiologically distended, no wall thickening.

Other: No free air, free fluid, or intra-abdominal fluid collection.
Fat containing umbilical hernia with mild surrounding edema in the
anterior abdominal wall, appears chronic.

Musculoskeletal: There are no acute or suspicious osseous
abnormalities. Stable degenerative change in the spine. Hemi
transitional lumbosacral anatomy.
IMPRESSION: 1. Persistent or recurrent diverticulitis in the sigmoid colon,
slightly increased in degree of inflammation from prior exam. There
is however no perforation, abscess or complication.
2. Additional chronic findings are stable.

## 2017-02-24 DIAGNOSIS — E1142 Type 2 diabetes mellitus with diabetic polyneuropathy: Secondary | ICD-10-CM | POA: Diagnosis not present

## 2017-02-24 DIAGNOSIS — Z6841 Body Mass Index (BMI) 40.0 and over, adult: Secondary | ICD-10-CM | POA: Diagnosis not present

## 2017-02-24 DIAGNOSIS — Z794 Long term (current) use of insulin: Secondary | ICD-10-CM | POA: Diagnosis not present

## 2017-02-24 DIAGNOSIS — H8110 Benign paroxysmal vertigo, unspecified ear: Secondary | ICD-10-CM | POA: Diagnosis not present

## 2017-03-12 DIAGNOSIS — E669 Obesity, unspecified: Secondary | ICD-10-CM | POA: Diagnosis not present

## 2017-03-12 DIAGNOSIS — E1121 Type 2 diabetes mellitus with diabetic nephropathy: Secondary | ICD-10-CM | POA: Diagnosis not present

## 2017-03-12 DIAGNOSIS — N183 Chronic kidney disease, stage 3 (moderate): Secondary | ICD-10-CM | POA: Diagnosis not present

## 2017-03-12 DIAGNOSIS — Z1389 Encounter for screening for other disorder: Secondary | ICD-10-CM | POA: Diagnosis not present

## 2017-03-12 DIAGNOSIS — E7800 Pure hypercholesterolemia, unspecified: Secondary | ICD-10-CM | POA: Diagnosis not present

## 2017-03-12 DIAGNOSIS — G4733 Obstructive sleep apnea (adult) (pediatric): Secondary | ICD-10-CM | POA: Diagnosis not present

## 2017-03-12 DIAGNOSIS — I129 Hypertensive chronic kidney disease with stage 1 through stage 4 chronic kidney disease, or unspecified chronic kidney disease: Secondary | ICD-10-CM | POA: Diagnosis not present

## 2017-05-04 DIAGNOSIS — Z23 Encounter for immunization: Secondary | ICD-10-CM | POA: Diagnosis not present

## 2017-06-09 DIAGNOSIS — Z794 Long term (current) use of insulin: Secondary | ICD-10-CM | POA: Diagnosis not present

## 2017-06-09 DIAGNOSIS — H8110 Benign paroxysmal vertigo, unspecified ear: Secondary | ICD-10-CM | POA: Diagnosis not present

## 2017-06-09 DIAGNOSIS — E1142 Type 2 diabetes mellitus with diabetic polyneuropathy: Secondary | ICD-10-CM | POA: Diagnosis not present

## 2017-06-24 ENCOUNTER — Other Ambulatory Visit: Payer: Self-pay | Admitting: Cardiology

## 2017-08-08 ENCOUNTER — Other Ambulatory Visit: Payer: Self-pay | Admitting: Cardiology

## 2017-08-21 DIAGNOSIS — E669 Obesity, unspecified: Secondary | ICD-10-CM | POA: Diagnosis not present

## 2017-08-21 DIAGNOSIS — D509 Iron deficiency anemia, unspecified: Secondary | ICD-10-CM | POA: Diagnosis not present

## 2017-08-21 DIAGNOSIS — G4733 Obstructive sleep apnea (adult) (pediatric): Secondary | ICD-10-CM | POA: Diagnosis not present

## 2017-08-21 DIAGNOSIS — Z6841 Body Mass Index (BMI) 40.0 and over, adult: Secondary | ICD-10-CM | POA: Diagnosis not present

## 2017-08-21 DIAGNOSIS — E1165 Type 2 diabetes mellitus with hyperglycemia: Secondary | ICD-10-CM | POA: Diagnosis not present

## 2017-08-21 DIAGNOSIS — Z0000 Encounter for general adult medical examination without abnormal findings: Secondary | ICD-10-CM | POA: Diagnosis not present

## 2017-08-21 DIAGNOSIS — I129 Hypertensive chronic kidney disease with stage 1 through stage 4 chronic kidney disease, or unspecified chronic kidney disease: Secondary | ICD-10-CM | POA: Diagnosis not present

## 2017-08-21 DIAGNOSIS — E1142 Type 2 diabetes mellitus with diabetic polyneuropathy: Secondary | ICD-10-CM | POA: Diagnosis not present

## 2017-08-21 DIAGNOSIS — K210 Gastro-esophageal reflux disease with esophagitis: Secondary | ICD-10-CM | POA: Diagnosis not present

## 2017-08-21 DIAGNOSIS — I110 Hypertensive heart disease with heart failure: Secondary | ICD-10-CM | POA: Diagnosis not present

## 2017-08-21 DIAGNOSIS — E1121 Type 2 diabetes mellitus with diabetic nephropathy: Secondary | ICD-10-CM | POA: Diagnosis not present

## 2017-08-21 DIAGNOSIS — N183 Chronic kidney disease, stage 3 (moderate): Secondary | ICD-10-CM | POA: Diagnosis not present

## 2017-08-31 ENCOUNTER — Other Ambulatory Visit: Payer: Self-pay

## 2017-08-31 MED ORDER — METOPROLOL SUCCINATE ER 25 MG PO TB24: 25.0000 mg | ORAL_TABLET | Freq: Every day | ORAL | 0 refills | Status: DC

## 2017-09-11 DIAGNOSIS — J209 Acute bronchitis, unspecified: Secondary | ICD-10-CM | POA: Diagnosis not present

## 2017-09-22 ENCOUNTER — Other Ambulatory Visit: Payer: Self-pay | Admitting: Family Medicine

## 2017-09-22 DIAGNOSIS — E1142 Type 2 diabetes mellitus with diabetic polyneuropathy: Secondary | ICD-10-CM | POA: Diagnosis not present

## 2017-09-22 DIAGNOSIS — Z6841 Body Mass Index (BMI) 40.0 and over, adult: Secondary | ICD-10-CM | POA: Diagnosis not present

## 2017-09-22 DIAGNOSIS — Z139 Encounter for screening, unspecified: Secondary | ICD-10-CM

## 2017-09-22 DIAGNOSIS — Z794 Long term (current) use of insulin: Secondary | ICD-10-CM | POA: Diagnosis not present

## 2017-10-07 ENCOUNTER — Other Ambulatory Visit: Payer: Self-pay | Admitting: Cardiology

## 2017-10-07 MED ORDER — METOPROLOL SUCCINATE ER 25 MG PO TB24: 25.0000 mg | ORAL_TABLET | Freq: Every day | ORAL | 1 refills | Status: DC

## 2017-10-09 ENCOUNTER — Ambulatory Visit
Admission: RE | Admit: 2017-10-09 | Discharge: 2017-10-09 | Disposition: A | Discharge status: Home / Self Care | Payer: Medicare Other | Source: Ambulatory Visit | Attending: Family Medicine | Admitting: Family Medicine

## 2017-10-09 DIAGNOSIS — Z139 Encounter for screening, unspecified: Secondary | ICD-10-CM

## 2017-10-09 DIAGNOSIS — Z1231 Encounter for screening mammogram for malignant neoplasm of breast: Secondary | ICD-10-CM | POA: Diagnosis not present

## 2017-10-12 DIAGNOSIS — M8588 Other specified disorders of bone density and structure, other site: Secondary | ICD-10-CM | POA: Diagnosis not present

## 2017-10-15 DIAGNOSIS — L853 Xerosis cutis: Secondary | ICD-10-CM | POA: Diagnosis not present

## 2017-10-15 DIAGNOSIS — D1801 Hemangioma of skin and subcutaneous tissue: Secondary | ICD-10-CM | POA: Diagnosis not present

## 2017-10-15 DIAGNOSIS — L814 Other melanin hyperpigmentation: Secondary | ICD-10-CM | POA: Diagnosis not present

## 2017-10-15 DIAGNOSIS — L821 Other seborrheic keratosis: Secondary | ICD-10-CM | POA: Diagnosis not present

## 2017-10-15 DIAGNOSIS — D229 Melanocytic nevi, unspecified: Secondary | ICD-10-CM | POA: Diagnosis not present

## 2017-10-19 ENCOUNTER — Other Ambulatory Visit: Payer: Self-pay | Admitting: Gastroenterology

## 2017-10-29 ENCOUNTER — Other Ambulatory Visit: Payer: Self-pay | Admitting: Gastroenterology

## 2017-11-02 ENCOUNTER — Encounter (HOSPITAL_COMMUNITY): Payer: Self-pay | Admitting: *Deleted

## 2017-11-02 ENCOUNTER — Other Ambulatory Visit: Payer: Self-pay

## 2017-11-02 NOTE — Progress Notes (Signed)
Sharon [name] reports that CBG's run 140's.  Patient was instructed by Dr Watt Climes to take 1/2 of Lantus this am and no Lantus in am.  I instructed patient to not take any other injections for diabetes in am ,unless CBG > 220, then take 1/2 of sliding scale insulin. I instructed patient to check CBG after awaking, if CBG is less than 70 to drink 1/2 cup of a clear juice. Recheck CBG in 15 minutes then call pre- op desk at [phone] for further instructions. If scheduled to receive Insulin, do not take Insulin

## 2017-11-03 ENCOUNTER — Encounter (HOSPITAL_COMMUNITY): Payer: Self-pay | Admitting: *Deleted

## 2017-11-03 ENCOUNTER — Ambulatory Visit (HOSPITAL_COMMUNITY): Payer: Medicare Other | Admitting: Emergency Medicine

## 2017-11-03 ENCOUNTER — Encounter (HOSPITAL_COMMUNITY)
Admission: RE | Disposition: A | Discharge status: Home / Self Care | Payer: Self-pay | Source: Ambulatory Visit | Attending: Gastroenterology

## 2017-11-03 ENCOUNTER — Other Ambulatory Visit: Payer: Self-pay

## 2017-11-03 ENCOUNTER — Ambulatory Visit (HOSPITAL_COMMUNITY)
Admission: RE | Admit: 2017-11-03 | Discharge: 2017-11-03 | Disposition: A | Discharge status: Home / Self Care | Payer: Medicare Other | Source: Ambulatory Visit | Attending: Gastroenterology | Admitting: Gastroenterology

## 2017-11-03 DIAGNOSIS — K5790 Diverticulosis of intestine, part unspecified, without perforation or abscess without bleeding: Secondary | ICD-10-CM | POA: Diagnosis not present

## 2017-11-03 DIAGNOSIS — K210 Gastro-esophageal reflux disease with esophagitis: Secondary | ICD-10-CM | POA: Diagnosis not present

## 2017-11-03 DIAGNOSIS — F329 Major depressive disorder, single episode, unspecified: Secondary | ICD-10-CM | POA: Diagnosis not present

## 2017-11-03 DIAGNOSIS — Z6841 Body Mass Index (BMI) 40.0 and over, adult: Secondary | ICD-10-CM | POA: Insufficient documentation

## 2017-11-03 DIAGNOSIS — K449 Diaphragmatic hernia without obstruction or gangrene: Secondary | ICD-10-CM | POA: Insufficient documentation

## 2017-11-03 DIAGNOSIS — D124 Benign neoplasm of descending colon: Secondary | ICD-10-CM | POA: Diagnosis not present

## 2017-11-03 DIAGNOSIS — K5730 Diverticulosis of large intestine without perforation or abscess without bleeding: Secondary | ICD-10-CM | POA: Diagnosis not present

## 2017-11-03 DIAGNOSIS — K648 Other hemorrhoids: Secondary | ICD-10-CM | POA: Insufficient documentation

## 2017-11-03 DIAGNOSIS — K644 Residual hemorrhoidal skin tags: Secondary | ICD-10-CM | POA: Diagnosis not present

## 2017-11-03 DIAGNOSIS — F418 Other specified anxiety disorders: Secondary | ICD-10-CM | POA: Insufficient documentation

## 2017-11-03 DIAGNOSIS — I509 Heart failure, unspecified: Secondary | ICD-10-CM | POA: Insufficient documentation

## 2017-11-03 DIAGNOSIS — Z791 Long term (current) use of non-steroidal anti-inflammatories (NSAID): Secondary | ICD-10-CM | POA: Diagnosis not present

## 2017-11-03 DIAGNOSIS — Z86010 Personal history of colonic polyps: Secondary | ICD-10-CM | POA: Insufficient documentation

## 2017-11-03 DIAGNOSIS — Z794 Long term (current) use of insulin: Secondary | ICD-10-CM | POA: Insufficient documentation

## 2017-11-03 DIAGNOSIS — Z79899 Other long term (current) drug therapy: Secondary | ICD-10-CM | POA: Insufficient documentation

## 2017-11-03 DIAGNOSIS — K219 Gastro-esophageal reflux disease without esophagitis: Secondary | ICD-10-CM | POA: Insufficient documentation

## 2017-11-03 DIAGNOSIS — Z1211 Encounter for screening for malignant neoplasm of colon: Secondary | ICD-10-CM | POA: Diagnosis not present

## 2017-11-03 DIAGNOSIS — Z800 Family history of malignant neoplasm of digestive organs: Secondary | ICD-10-CM | POA: Diagnosis not present

## 2017-11-03 DIAGNOSIS — E119 Type 2 diabetes mellitus without complications: Secondary | ICD-10-CM | POA: Insufficient documentation

## 2017-11-03 DIAGNOSIS — I110 Hypertensive heart disease with heart failure: Secondary | ICD-10-CM | POA: Insufficient documentation

## 2017-11-03 HISTORY — DX: Major depressive disorder, single episode, unspecified: F32.9

## 2017-11-03 HISTORY — DX: Depression, unspecified: F32.A

## 2017-11-03 HISTORY — DX: Unspecified hearing loss, left ear: H91.92

## 2017-11-03 HISTORY — DX: Cardiac murmur, unspecified: R01.1

## 2017-11-03 HISTORY — PX: COLONOSCOPY WITH PROPOFOL: SHX5780

## 2017-11-03 HISTORY — PX: ESOPHAGOGASTRODUODENOSCOPY (EGD) WITH PROPOFOL: SHX5813

## 2017-11-03 HISTORY — DX: Unspecified convulsions: R56.9

## 2017-11-03 HISTORY — DX: Personal history of urinary calculi: Z87.442

## 2017-11-03 LAB — GLUCOSE, CAPILLARY: Glucose-Capillary: 139 mg/dL — ABNORMAL HIGH (ref 65–99)

## 2017-11-03 SURGERY — COLONOSCOPY WITH PROPOFOL
Anesthesia: Monitor Anesthesia Care

## 2017-11-03 MED ORDER — LACTATED RINGERS IV SOLN
INTRAVENOUS | Status: DC
  Administered 2017-11-03: 07:00:00 via INTRAVENOUS

## 2017-11-03 MED ORDER — SODIUM CHLORIDE 0.9 % IV SOLN: INTRAVENOUS | Status: DC

## 2017-11-03 MED ORDER — PROPOFOL 10 MG/ML IV BOLUS
INTRAVENOUS | Status: DC | PRN
  Administered 2017-11-03: 30 mg via INTRAVENOUS
  Administered 2017-11-03: 20 mg via INTRAVENOUS

## 2017-11-03 MED ORDER — PROPOFOL 500 MG/50ML IV EMUL
INTRAVENOUS | Status: DC | PRN
  Administered 2017-11-03: 100 ug/kg/min via INTRAVENOUS

## 2017-11-03 MED ORDER — LIDOCAINE 2% (20 MG/ML) 5 ML SYRINGE
INTRAMUSCULAR | Status: DC | PRN
  Administered 2017-11-03: 40 mg via INTRAVENOUS

## 2017-11-03 SURGICAL SUPPLY — 24 items

## 2017-11-03 NOTE — Progress Notes (Signed)
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

## 2017-11-03 NOTE — Anesthesia Postprocedure Evaluation (Signed)
Anesthesia Post Note  Patient: Sharon [name]  Procedure(s) Performed: COLONOSCOPY WITH PROPOFOL (N/A ) ESOPHAGOGASTRODUODENOSCOPY (EGD) WITH PROPOFOL (N/A )     Patient location during evaluation: PACU Anesthesia Type: MAC Level of consciousness: awake and alert Pain management: pain level controlled Vital Signs Assessment: post-procedure vital signs reviewed and stable Respiratory status: spontaneous breathing, nonlabored ventilation, respiratory function stable and patient connected to nasal cannula oxygen Cardiovascular status: stable and blood pressure returned to baseline Postop Assessment: no apparent nausea or vomiting Anesthetic complications: no    Last Vitals:  Vitals:   11/03/17 0911 11/03/17 0920  BP: (!) 173/59 (!) 166/55  Pulse: 64 63  Resp: 18 20  Temp: 36.6 C   SpO2: 96% 95%    Last Pain:  Vitals:   11/03/17 0920  TempSrc:   PainSc: 0-No pain                 Tiajuana Amass

## 2017-11-03 NOTE — Op Note (Signed)
[facility] Patient Name: Sharon [name] Procedure Date : 11/03/2017 MRN: [id number] Attending MD: Clarene Essex , MD Date of Birth: 09-10-1945 CSN: [id number] Age: 72 Admit Type: Outpatient Procedure:                Colonoscopy Indications:              Screening for colorectal malignant neoplasm, High                            risk colon cancer surveillance: Personal history of                            colonic polyps, Family history of colon cancer in a                            distant relative Providers:                Clarene Essex, MD, Cleda Daub, RN, Charolette Child,                            Technician Referring MD:              Medicines:                Propofol total dose 867 mg IV Complications:            No immediate complications. Estimated Blood Loss:     Estimated blood loss: none. Procedure:                Pre-Anesthesia Assessment:                           - Prior to the procedure, a History and Physical                            was performed, and patient medications and                            allergies were reviewed. The patient's tolerance of                            previous anesthesia was also reviewed. The risks                            and benefits of the procedure and the sedation                            options and risks were discussed with the patient.                            All questions were answered, and informed consent                            was obtained. Prior Anticoagulants: The patient has  taken no previous anticoagulant or antiplatelet                            agents. ASA Grade Assessment: III - A patient with                            severe systemic disease. After reviewing the risks                            and benefits, the patient was deemed in                            satisfactory condition to undergo the procedure.                           After obtaining informed consent,  the colonoscope                            was passed under direct vision. Throughout the                            procedure, the patient's blood pressure, pulse, and                            oxygen saturations were monitored continuously. The                            EC-[id number] ([id number]) scope was introduced through                            the anus and advanced to the the cecum, identified                            by appendiceal orifice and ileocecal valve. The                            colonoscopy was somewhat difficult due to                            inadequate bowel prep. Successful completion of the                            procedure was aided by applying abdominal pressure.                            The patient tolerated the procedure well. The                            quality of the bowel preparation was inadequate. Scope In: 8:44:19 AM Scope Out: 9:05:21 AM Scope Withdrawal Time: 0 hours 6 minutes 6 seconds  Total Procedure Duration: 0 hours 21 minutes 2 seconds  Findings:      External and internal hemorrhoids were found during retroflexion, during       perianal exam  and during digital exam. The hemorrhoids were small.      A few small-mouthed diverticula were found in the sigmoid colon.      A large amount of semi-solid stool was found in the entire colon,       interfering with visualization. Lavage of the area was performed using a       moderate amount of sterile water, resulting in incomplete clearance with       continued poor visualization.      The exam was otherwise without abnormality.      A tattoo was seen in the distal sigmoid colon. A post-polypectomy scar       was found at the tattoo site. There was no evidence of residual polyp       tissue. Impression:               - Preparation of the colon was inadequate.                           - External and internal hemorrhoids.                           - Diverticulosis in the sigmoid colon.                            - Stool in the entire examined colon.                           - The examination was otherwise normal.                           - A tattoo was seen in the distal sigmoid colon. A                            post-polypectomy scar was found at the tattoo site.                            There was no evidence of residual polyp tissue.                           - No specimens collected. Recommendation:           - Patient has a contact number available for                            emergencies. The signs and symptoms of potential                            delayed complications were discussed with the                            patient. Return to normal activities tomorrow.                            Written discharge instructions were provided to the  patient.                           - Soft diet today.                           - Continue present medications.                           - Repeat colonoscopy at appointment to be scheduled                            for screening purposes.                           - Return to GI office PRN.                           - Telephone GI clinic if symptomatic PRN. Procedure Code(s):        --- Professional ---                           [id number], Colonoscopy, flexible; diagnostic, including                            collection of specimen(s) by brushing or washing,                            when performed (separate procedure) Diagnosis Code(s):        --- Professional ---                           Z12.11, Encounter for screening for malignant                            neoplasm of colon                           Z86.010, Personal history of colonic polyps                           Z80.0, Family history of malignant neoplasm of                            digestive organs                           K57.30, Diverticulosis of large intestine without                            perforation or abscess without  bleeding CPT copyright 2017 [employer]. All rights reserved. The codes documented in this report are preliminary and upon coder review may  be revised to meet current compliance requirements. Clarene Essex, MD 11/03/2017 9:18:05 AM This report has been signed electronically. Number of Addenda: 0

## 2017-11-03 NOTE — Progress Notes (Signed)
Pt stated that everything in her history remains the same since provided with pre-op instructions yesterday. Pt verbalized understanding of all pre-op instructions.

## 2017-11-03 NOTE — Op Note (Signed)
[facility] Patient Name: Sharon [name] Procedure Date : 11/03/2017 MRN: [id number] Attending MD: Clarene Essex , MD Date of Birth: 16-Jul-1945 CSN: [id number] Age: 72 Admit Type: Outpatient Procedure:                Upper GI endoscopy Indications:              For therapy of esophageal reflux Providers:                Clarene Essex, MD, Cleda Daub, RN, Charolette Child,                            Technician Referring MD:              Medicines:                Propofol total dose 338 mg IV Complications:            No immediate complications. Estimated Blood Loss:     Estimated blood loss: none. Procedure:                Pre-Anesthesia Assessment:                           - Prior to the procedure, a History and Physical                            was performed, and patient medications and                            allergies were reviewed. The patient's tolerance of                            previous anesthesia was also reviewed. The risks                            and benefits of the procedure and the sedation                            options and risks were discussed with the patient.                            All questions were answered, and informed consent                            was obtained. Prior Anticoagulants: The patient has                            taken no previous anticoagulant or antiplatelet                            agents. ASA Grade Assessment: III - A patient with                            severe systemic disease. After reviewing the risks  and benefits, the patient was deemed in                            satisfactory condition to undergo the procedure.                           After obtaining informed consent, the endoscope was                            passed under direct vision. Throughout the                            procedure, the patient's blood pressure, pulse, and                            oxygen  saturations were monitored continuously. The                            [id number] ([id number]) scope was introduced through the                            mouth, and advanced to the third part of duodenum.                            The upper GI endoscopy was accomplished without                            difficulty. The patient tolerated the procedure                            well. Scope In: Scope Out: Findings:      The larynx was normal.      A small hiatal hernia was present.      The entire examined stomach was normal.      The duodenal bulb, first portion of the duodenum, second portion of the       duodenum and third portion of the duodenum were normal.      The exam was otherwise without abnormality. Impression:               - Normal larynx.                           - Small hiatal hernia.                           - Normal stomach.                           - Normal duodenal bulb, first portion of the                            duodenum, second portion of the duodenum and third                            portion of the duodenum.                           -  The examination was otherwise normal.                           - No specimens collected. Recommendation:           - Patient has a contact number available for                            emergencies. The signs and symptoms of potential                            delayed complications were discussed with the                            patient. Return to normal activities tomorrow.                            Written discharge instructions were provided to the                            patient.                           - Soft diet today.                           - Continue present medications.                           - Return to GI clinic PRN.                           - Telephone GI clinic if symptomatic PRN.                           - Perform a colonoscopy today. Procedure Code(s):        --- Professional ---                            [phone], Esophagogastroduodenoscopy, flexible,                            transoral; diagnostic, including collection of                            specimen(s) by brushing or washing, when performed                            (separate procedure) Diagnosis Code(s):        --- Professional ---                           K44.9, Diaphragmatic hernia without obstruction or                            gangrene  K21.9, Gastro-esophageal reflux disease without                            esophagitis CPT copyright 2017 American Medical [employer]. All rights reserved. The codes documented in this report are preliminary and upon coder review may  be revised to meet current compliance requirements. Clarene Essex, MD 11/03/2017 9:12:51 AM This report has been signed electronically. Number of Addenda: 0

## 2017-11-03 NOTE — Anesthesia Preprocedure Evaluation (Signed)
Anesthesia Evaluation  Patient identified by MRN, date of birth, ID band Patient awake    Reviewed: Allergy & Precautions, NPO status , Patient's Chart, lab work & pertinent test results, reviewed documented beta blocker date and time   Airway Mallampati: II  TM Distance: >3 FB Neck ROM: Full    Dental   Pulmonary asthma ,    breath sounds clear to auscultation       Cardiovascular hypertension, Pt. on medications and Pt. on home beta blockers +CHF   Rhythm:Regular Rate:Normal     Neuro/Psych Seizures -,  Anxiety Depression    GI/Hepatic Neg liver ROS, GERD  ,  Endo/Other  diabetes, Type 2, Insulin DependentMorbid obesity  Renal/GU Renal disease     Musculoskeletal  (+) Arthritis ,   Abdominal   Peds  Hematology  (+) anemia ,   Anesthesia Other Findings   Reproductive/Obstetrics                             Anesthesia Physical Anesthesia Plan  ASA: III  Anesthesia Plan: MAC   Post-op Pain Management:    Induction: Intravenous  PONV Risk Score and Plan: 2 and Ondansetron, Propofol infusion and Treatment may vary due to age or medical condition  Airway Management Planned: Natural Airway and Nasal Cannula  Additional Equipment:   Intra-op Plan:   Post-operative Plan:   Informed Consent: I have reviewed the patients History and Physical, chart, labs and discussed the procedure including the risks, benefits and alternatives for the proposed anesthesia with the patient or authorized representative who has indicated his/her understanding and acceptance.     Plan Discussed with:   Anesthesia Plan Comments:         Anesthesia Quick Evaluation

## 2017-11-03 NOTE — Transfer of Care (Signed)
Immediate Anesthesia Transfer of Care Note  Patient: Sharon [name]  Procedure(s) Performed: COLONOSCOPY WITH PROPOFOL (N/A ) ESOPHAGOGASTRODUODENOSCOPY (EGD) WITH PROPOFOL (N/A )  Patient Location: PACU and Endoscopy Unit  Anesthesia Type:MAC  Level of Consciousness: awake, alert  and oriented  Airway & Oxygen Therapy: Patient Spontanous Breathing and Patient connected to face mask oxygen  Post-op Assessment: Report given to RN and Post -op Vital signs reviewed and stable  Post vital signs: Reviewed and stable  Last Vitals:  Vitals Value Taken Time  BP 173/59 11/03/2017  9:11 AM  Temp 36.6 C 11/03/2017  9:11 AM  Pulse 64 11/03/2017  9:12 AM  Resp 20 11/03/2017  9:12 AM  SpO2 98 % 11/03/2017  9:12 AM  Vitals shown include unvalidated device data.  Last Pain:  Vitals:   11/03/17 0911  TempSrc: Oral  PainSc: 0-No pain         Complications: No apparent anesthesia complications

## 2017-11-03 NOTE — Discharge Instructions (Signed)
Esophagogastroduodenoscopy, Care After °Refer to this sheet in the next few weeks. These instructions provide you with information about caring for yourself after your procedure. Your health care provider may also give you more specific instructions. Your treatment has been planned according to current medical practices, but problems sometimes occur. Call your health care provider if you have any problems or questions after your procedure. °What can I expect after the procedure? °After the procedure, it is common to have: °· A sore throat. °· Nausea. °· Bloating. °· Dizziness. °· Fatigue. ° °Follow these instructions at home: °· Do not eat or drink anything until the numbing medicine (local anesthetic) has worn off and your gag reflex has returned. You will know that the local anesthetic has worn off when you can swallow comfortably. °· Do not drive for 24 hours if you received a medicine to help you relax (sedative). °· If your health care provider took a tissue sample for testing during the procedure, make sure to get your test results. This is your responsibility. Ask your health care provider or the department performing the test when your results will be ready. °· Keep all follow-up visits as told by your health care provider. This is important. °Contact a health care provider if: °· You cannot stop coughing. °· You are not urinating. °· You are urinating less than usual. °Get help right away if: °· You have trouble swallowing. °· You cannot eat or drink. °· You have throat or chest pain that gets worse. °· You are dizzy or light-headed. °· You faint. °· You have nausea or vomiting. °· You have chills. °· You have a fever. °· You have severe abdominal pain. °· You have black, tarry, or bloody stools. °This information is not intended to replace advice given to you by your health care provider. Make sure you discuss any questions you have with your health care provider. °Document Released: 06/16/2012 Document  Revised: 12/06/2015 Document Reviewed: 05/24/2015 °Elsevier Interactive Patient Education © 2018 [employer] °Colonoscopy, Adult, Care After °This sheet gives you information about how to care for yourself after your procedure. Your doctor may also give you more specific instructions. If you have problems or questions, call your doctor. °Follow these instructions at home: °General instructions ° °· For the first 24 hours after the procedure: °? Do not drive or use machinery. °? Do not sign important documents. °? Do not drink alcohol. °? Do your daily activities more slowly than normal. °? Eat foods that are soft and easy to digest. °? Rest often. °· Take over-the-counter or prescription medicines only as told by your doctor. °· It is up to you to get the results of your procedure. Ask your doctor, or the department performing the procedure, when your results will be ready. °To help cramping and bloating: °· Try walking around. °· Put heat on your belly (abdomen) as told by your doctor. Use a heat source that your doctor recommends, such as a moist heat pack or a heating pad. °? Put a towel between your skin and the heat source. °? Leave the heat on for 20-30 minutes. °? Remove the heat if your skin turns bright red. This is especially important if you cannot feel pain, heat, or cold. You can get burned. °Eating and drinking °· Drink enough fluid to keep your pee (urine) clear or pale yellow. °· Return to your normal diet as told by your doctor. Avoid heavy or fried foods that are hard to digest. °· Avoid   drinking alcohol for as long as told by your doctor. Contact a doctor if:  You have blood in your poop (stool) 2-3 days after the procedure. Get help right away if:  You have more than a small amount of blood in your poop.  You see large clumps of tissue (blood clots) in your poop.  Your belly is swollen.  You feel sick to your stomach (nauseous).  You throw up (vomit).  You have a fever.  You  have belly pain that gets worse, and medicine does not help your pain. This information is not intended to replace advice given to you by your health care provider. Make sure you discuss any questions you have with your health care provider. Document Released: 08/02/2010 Document Revised: 03/24/2016 Document Reviewed: 03/24/2016 Elsevier Interactive Patient Education  2017 [employer]. Call if question or problem otherwise follow-up as needed and we will decide when to repeat colonoscopy with better prep

## 2017-11-04 ENCOUNTER — Other Ambulatory Visit: Payer: Self-pay

## 2017-11-04 ENCOUNTER — Encounter (HOSPITAL_COMMUNITY)
Admission: RE | Disposition: A | Discharge status: Home / Self Care | Payer: Self-pay | Source: Ambulatory Visit | Attending: Gastroenterology

## 2017-11-04 ENCOUNTER — Ambulatory Visit (HOSPITAL_COMMUNITY): Payer: Medicare Other | Admitting: Anesthesiology

## 2017-11-04 ENCOUNTER — Ambulatory Visit (HOSPITAL_COMMUNITY)
Admission: RE | Admit: 2017-11-04 | Discharge: 2017-11-04 | Disposition: A | Discharge status: Home / Self Care | Payer: Medicare Other | Source: Ambulatory Visit | Attending: Gastroenterology | Admitting: Gastroenterology

## 2017-11-04 ENCOUNTER — Encounter (HOSPITAL_COMMUNITY): Payer: Self-pay | Admitting: Emergency Medicine

## 2017-11-04 DIAGNOSIS — N189 Chronic kidney disease, unspecified: Secondary | ICD-10-CM | POA: Insufficient documentation

## 2017-11-04 DIAGNOSIS — K644 Residual hemorrhoidal skin tags: Secondary | ICD-10-CM | POA: Diagnosis not present

## 2017-11-04 DIAGNOSIS — Z86711 Personal history of pulmonary embolism: Secondary | ICD-10-CM | POA: Insufficient documentation

## 2017-11-04 DIAGNOSIS — K5730 Diverticulosis of large intestine without perforation or abscess without bleeding: Secondary | ICD-10-CM | POA: Insufficient documentation

## 2017-11-04 DIAGNOSIS — Z794 Long term (current) use of insulin: Secondary | ICD-10-CM | POA: Insufficient documentation

## 2017-11-04 DIAGNOSIS — F418 Other specified anxiety disorders: Secondary | ICD-10-CM | POA: Diagnosis not present

## 2017-11-04 DIAGNOSIS — I5032 Chronic diastolic (congestive) heart failure: Secondary | ICD-10-CM | POA: Diagnosis not present

## 2017-11-04 DIAGNOSIS — Z1211 Encounter for screening for malignant neoplasm of colon: Secondary | ICD-10-CM | POA: Insufficient documentation

## 2017-11-04 DIAGNOSIS — E669 Obesity, unspecified: Secondary | ICD-10-CM | POA: Diagnosis not present

## 2017-11-04 DIAGNOSIS — Z79899 Other long term (current) drug therapy: Secondary | ICD-10-CM | POA: Diagnosis not present

## 2017-11-04 DIAGNOSIS — E1142 Type 2 diabetes mellitus with diabetic polyneuropathy: Secondary | ICD-10-CM | POA: Diagnosis not present

## 2017-11-04 DIAGNOSIS — D124 Benign neoplasm of descending colon: Secondary | ICD-10-CM | POA: Diagnosis not present

## 2017-11-04 DIAGNOSIS — Z6841 Body Mass Index (BMI) 40.0 and over, adult: Secondary | ICD-10-CM | POA: Diagnosis not present

## 2017-11-04 DIAGNOSIS — I110 Hypertensive heart disease with heart failure: Secondary | ICD-10-CM | POA: Diagnosis not present

## 2017-11-04 DIAGNOSIS — E785 Hyperlipidemia, unspecified: Secondary | ICD-10-CM | POA: Diagnosis not present

## 2017-11-04 DIAGNOSIS — Z800 Family history of malignant neoplasm of digestive organs: Secondary | ICD-10-CM | POA: Insufficient documentation

## 2017-11-04 DIAGNOSIS — I509 Heart failure, unspecified: Secondary | ICD-10-CM | POA: Insufficient documentation

## 2017-11-04 DIAGNOSIS — E1122 Type 2 diabetes mellitus with diabetic chronic kidney disease: Secondary | ICD-10-CM | POA: Diagnosis not present

## 2017-11-04 DIAGNOSIS — K648 Other hemorrhoids: Secondary | ICD-10-CM | POA: Diagnosis not present

## 2017-11-04 DIAGNOSIS — I130 Hypertensive heart and chronic kidney disease with heart failure and stage 1 through stage 4 chronic kidney disease, or unspecified chronic kidney disease: Secondary | ICD-10-CM | POA: Insufficient documentation

## 2017-11-04 DIAGNOSIS — M1990 Unspecified osteoarthritis, unspecified site: Secondary | ICD-10-CM | POA: Diagnosis not present

## 2017-11-04 HISTORY — PX: COLONOSCOPY WITH PROPOFOL: SHX5780

## 2017-11-04 LAB — GLUCOSE, CAPILLARY: Glucose-Capillary: 110 mg/dL — ABNORMAL HIGH (ref 65–99)

## 2017-11-04 SURGERY — COLONOSCOPY WITH PROPOFOL
Anesthesia: Monitor Anesthesia Care

## 2017-11-04 MED ORDER — ONDANSETRON HCL 4 MG/2ML IJ SOLN
INTRAMUSCULAR | Status: DC | PRN
  Administered 2017-11-04: 4 mg via INTRAVENOUS

## 2017-11-04 MED ORDER — LACTATED RINGERS IV SOLN: INTRAVENOUS | Status: DC

## 2017-11-04 MED ORDER — FENTANYL CITRATE (PF) 100 MCG/2ML IJ SOLN
INTRAMUSCULAR | Status: DC | PRN
  Administered 2017-11-04: 50 ug via INTRAVENOUS

## 2017-11-04 MED ORDER — EPHEDRINE SULFATE-NACL 50-0.9 MG/10ML-% IV SOSY
PREFILLED_SYRINGE | INTRAVENOUS | Status: DC | PRN
  Administered 2017-11-04 (×2): 5 mg via INTRAVENOUS

## 2017-11-04 MED ORDER — PROPOFOL 10 MG/ML IV BOLUS
INTRAVENOUS | Status: DC | PRN
  Administered 2017-11-04: 20 mg via INTRAVENOUS
  Administered 2017-11-04: 50 mg via INTRAVENOUS
  Administered 2017-11-04: 30 mg via INTRAVENOUS
  Administered 2017-11-04: 70 mg via INTRAVENOUS
  Administered 2017-11-04: 40 mg via INTRAVENOUS
  Administered 2017-11-04: 30 mg via INTRAVENOUS
  Administered 2017-11-04: 20 mg via INTRAVENOUS

## 2017-11-04 MED ORDER — LACTATED RINGERS IV SOLN
INTRAVENOUS | Status: DC | PRN
  Administered 2017-11-04: 10:00:00 via INTRAVENOUS

## 2017-11-04 MED ORDER — LIDOCAINE 2% (20 MG/ML) 5 ML SYRINGE
INTRAMUSCULAR | Status: DC | PRN
  Administered 2017-11-04: 70 mg via INTRAVENOUS

## 2017-11-04 SURGICAL SUPPLY — 21 items

## 2017-11-04 NOTE — Op Note (Signed)
[facility] Patient Name: Sharon [name] Procedure Date : 11/04/2017 MRN: [id number] Attending MD: Clarene Essex , MD Date of Birth: [birth year] CSN: [id number] Age: 72 Admit Type: Outpatient Procedure:                Colonoscopy Indications:              Screening for colorectal malignant neoplasm, Family                            history of colon cancer in a distant relative Providers:                Clarene Essex, MD, Cleda Daub, RN, Baird Cancer,                            RN, Alan Mulder, Technician Referring MD:              Medicines:                Propofol total dose 200 mg IV, Fentanyl 50                            micrograms IV, Ondansetron 4 mg IV,70 mg IV                            lidocaine Complications:            No immediate complications. Estimated Blood Loss:     Estimated blood loss: none. Procedure:                Pre-Anesthesia Assessment:                           - Prior to the procedure, a History and Physical                            was performed, and patient medications and                            allergies were reviewed. The patient's tolerance of                            previous anesthesia was also reviewed. The risks                            and benefits of the procedure and the sedation                            options and risks were discussed with the patient.                            All questions were answered, and informed consent                            was obtained. Prior Anticoagulants: The patient has  taken no previous anticoagulant or antiplatelet                            agents. ASA Grade Assessment: III - A patient with                            severe systemic disease. After reviewing the risks                            and benefits, the patient was deemed in                            satisfactory condition to undergo the procedure.                           After obtaining  informed consent, the colonoscope                            was passed under direct vision. Throughout the                            procedure, the patient's blood pressure, pulse, and                            oxygen saturations were monitored continuously. The                            EC-[id number] ([id number]) scope was introduced through                            the anus and advanced to the the cecum, identified                            by appendiceal orifice and ileocecal valve. The                            ileocecal valve, appendiceal orifice, and rectum                            were photographed. The colonoscopy was performed                            without difficulty. The patient tolerated the                            procedure well. The quality of the bowel                            preparation was adequate. abdominal pressure was                            applied Scope In: 9:52:24 AM Scope Out: 10:20:07 AM Scope Withdrawal Time: 0 hours 20 minutes 30 seconds  Total Procedure Duration: 0 hours 27 minutes 43  seconds  Findings:      External and internal hemorrhoids were found during retroflexion, during       perianal exam and during digital exam. The hemorrhoids were small.      A few small-mouthed diverticula were found in the sigmoid colon.      A small polyp was found in the mid descending colon. The polyp was       semi-sessile. The polyp was removed with a cold snare. Resection and       retrieval were complete.      The exam was otherwise without abnormality. Impression:               - External and internal hemorrhoids.                           - Diverticulosis in the sigmoid colon.                           - One small polyp in the mid descending colon,                            removed with a cold snare. Resected and retrieved.                           - The examination was otherwise normal. Recommendation:           - Patient has a contact number  available for                            emergencies. The signs and symptoms of potential                            delayed complications were discussed with the                            patient. Return to normal activities tomorrow.                            Written discharge instructions were provided to the                            patient.                           - Soft diet today.                           - Continue present medications.                           - Await pathology results.                           - Repeat colonoscopy in 5 years for surveillance                            based on pathology results.                           -  Return to GI office PRN.                           - Telephone GI clinic for pathology results in 1                            week.                           - Telephone GI clinic if symptomatic PRN. Procedure Code(s):        --- Professional ---                           [id number], Colonoscopy, flexible; with removal of                            tumor(s), polyp(s), or other lesion(s) by snare                            technique Diagnosis Code(s):        --- Professional ---                           Z12.11, Encounter for screening for malignant                            neoplasm of colon                           K64.8, Other hemorrhoids                           D12.4, Benign neoplasm of descending colon                           Z80.0, Family history of malignant neoplasm of                            digestive organs                           K57.30, Diverticulosis of large intestine without                            perforation or abscess without bleeding CPT copyright 2017 American Medical Association. All rights reserved. The codes documented in this report are preliminary and upon coder review may  be revised to meet current compliance requirements. Clarene Essex, MD 11/04/2017 10:32:00 AM This report has been signed  electronically. Number of Addenda: 0

## 2017-11-04 NOTE — Anesthesia Postprocedure Evaluation (Signed)
Anesthesia Post Note  Patient: Ames Coupe  Procedure(s) Performed: COLONOSCOPY WITH PROPOFOL (N/A )     Patient location during evaluation: PACU Anesthesia Type: MAC Level of consciousness: awake and alert Pain management: pain level controlled Vital Signs Assessment: post-procedure vital signs reviewed and stable Respiratory status: spontaneous breathing, nonlabored ventilation and respiratory function stable Cardiovascular status: stable and blood pressure returned to baseline Anesthetic complications: no    Last Vitals:  Vitals:   11/04/17 1025 11/04/17 1035  BP: (!) 148/55 (!) 144/56  Pulse: 62 (!) 59  Resp: 15 17  Temp:    SpO2: 99% 94%    Last Pain:  Vitals:   11/04/17 0926  TempSrc: Oral                 Audry Pili

## 2017-11-04 NOTE — Progress Notes (Signed)
Ames Coupe 9:45 AM  Subjective: Patient without any new complaints believe she's cleaner than yesterday did drink a lot of MiraLAX and no new complaints  Objective: Vital signs stable afebrile no acute distress exam please see preassessment evaluation  Assessment: Due for colonic screening  Plan: Will proceed with colonoscopy today with anesthesia assistance  [facility]  Pager [phone] After 5PM or if no answer call [phone]

## 2017-11-04 NOTE — Discharge Instructions (Signed)
Colonoscopy, Adult, Care After This sheet gives you information about how to care for yourself after your procedure. Your doctor may also give you more specific instructions. If you have problems or questions, call your doctor. Follow these instructions at home: General instructions   For the first 24 hours after the procedure: ? Do not drive or use machinery. ? Do not sign important documents. ? Do not drink alcohol. ? Do your daily activities more slowly than normal. ? Eat foods that are soft and easy to digest. ? Rest often.  Take over-the-counter or prescription medicines only as told by your doctor.  It is up to you to get the results of your procedure. Ask your doctor, or the department performing the procedure, when your results will be ready. To help cramping and bloating:  Try walking around.  Put heat on your belly (abdomen) as told by your doctor. Use a heat source that your doctor recommends, such as a moist heat pack or a heating pad. ? Put a towel between your skin and the heat source. ? Leave the heat on for 20-30 minutes. ? Remove the heat if your skin turns bright red. This is especially important if you cannot feel pain, heat, or cold. You can get burned. Eating and drinking  Drink enough fluid to keep your pee (urine) clear or pale yellow.  Return to your normal diet as told by your doctor. Avoid heavy or fried foods that are hard to digest.  Avoid drinking alcohol for as long as told by your doctor. Contact a doctor if:  You have blood in your poop (stool) 2-3 days after the procedure. Get help right away if:  You have more than a small amount of blood in your poop.  You see large clumps of tissue (blood clots) in your poop.  Your belly is swollen.  You feel sick to your stomach (nauseous).  You throw up (vomit).  You have a fever.  You have belly pain that gets worse, and medicine does not help your pain. This information is not intended to  replace advice given to you by your health care provider. Make sure you discuss any questions you have with your health care provider. Document Released: 08/02/2010 Document Revised: 03/24/2016 Document Reviewed: 03/24/2016 Elsevier Interactive Patient Education  2017 [employer]. Call if question or problem otherwise call for biopsy report in 1 week

## 2017-11-04 NOTE — Anesthesia Procedure Notes (Signed)
Procedure Name: MAC Date/Time: 11/04/2017 9:53 AM Performed by: Orlie Dakin, CRNA Pre-anesthesia Checklist: Patient identified, Emergency Drugs available, Suction available, Patient being monitored and Timeout performed Patient Re-evaluated:Patient Re-evaluated prior to induction Oxygen Delivery Method: Nasal cannula Preoxygenation: Pre-oxygenation with 100% oxygen Induction Type: IV induction

## 2017-11-04 NOTE — Anesthesia Preprocedure Evaluation (Addendum)
Anesthesia Evaluation  Patient identified by MRN, date of birth, ID band Patient awake    Reviewed: Allergy & Precautions, NPO status , Patient's Chart, lab work & pertinent test results, reviewed documented beta blocker date and time   Airway Mallampati: I  TM Distance: >3 FB Neck ROM: Full    Dental  (+) Edentulous Upper, Edentulous Lower   Pulmonary asthma ,    breath sounds clear to auscultation       Cardiovascular hypertension, Pt. on medications and Pt. on home beta blockers +CHF   Rhythm:Regular Rate:Normal  '17 TTE - Mild LVH. EF 65% to 70%. Grade 2 diastolic   Dysfunction. Left atrium was mildly dilated.   Neuro/Psych Seizures -, Well Controlled,  Anxiety Depression    GI/Hepatic Neg liver ROS, GERD  ,  Endo/Other  diabetes, Type 2, Insulin DependentMorbid obesity  Renal/GU Renal disease     Musculoskeletal  (+) Arthritis ,   Abdominal (+) + obese,   Peds  Hematology  (+) anemia ,   Anesthesia Other Findings   Reproductive/Obstetrics                            Anesthesia Physical  Anesthesia Plan  ASA: III  Anesthesia Plan: MAC   Post-op Pain Management:    Induction: Intravenous  PONV Risk Score and Plan: 2 and Propofol infusion and Treatment may vary due to age or medical condition  Airway Management Planned: Simple Face Mask and Natural Airway  Additional Equipment: None  Intra-op Plan:   Post-operative Plan:   Informed Consent: I have reviewed the patients History and Physical, chart, labs and discussed the procedure including the risks, benefits and alternatives for the proposed anesthesia with the patient or authorized representative who has indicated his/her understanding and acceptance.     Plan Discussed with: CRNA and Anesthesiologist  Anesthesia Plan Comments:         Anesthesia Quick Evaluation

## 2017-11-04 NOTE — Transfer of Care (Signed)
Immediate Anesthesia Transfer of Care Note  Patient: Sharon [name]  Procedure(s) Performed: COLONOSCOPY WITH PROPOFOL (N/A )  Patient Location: Endoscopy Unit  Anesthesia Type:MAC  Level of Consciousness: awake and patient cooperative  Airway & Oxygen Therapy: Patient Spontanous Breathing and Patient connected to nasal cannula oxygen  Post-op Assessment: Report given to RN and Post -op Vital signs reviewed and stable  Post vital signs: Reviewed and stable  Last Vitals:  Vitals Value Taken Time  BP 144/56 11/04/2017 10:35 AM  Temp    Pulse 59 11/04/2017 10:35 AM  Resp 10 11/04/2017 10:36 AM  SpO2 93 % 11/04/2017 10:35 AM  Vitals shown include unvalidated device data.  Last Pain:  Vitals:   11/04/17 0926  TempSrc: Oral         Complications: No apparent anesthesia complications

## 2017-11-18 ENCOUNTER — Encounter: Payer: Self-pay | Admitting: Cardiology

## 2017-11-18 ENCOUNTER — Ambulatory Visit (INDEPENDENT_AMBULATORY_CARE_PROVIDER_SITE_OTHER): Payer: Medicare Other | Admitting: Cardiology

## 2017-11-18 VITALS — BP 158/80 | HR 81 | Ht 59.0 in | Wt 272.0 lb

## 2017-11-18 DIAGNOSIS — I491 Atrial premature depolarization: Secondary | ICD-10-CM | POA: Diagnosis not present

## 2017-11-18 DIAGNOSIS — I350 Nonrheumatic aortic (valve) stenosis: Secondary | ICD-10-CM | POA: Diagnosis not present

## 2017-11-18 DIAGNOSIS — R002 Palpitations: Secondary | ICD-10-CM

## 2017-11-18 MED ORDER — METOPROLOL SUCCINATE ER 25 MG PO TB24: 25.0000 mg | ORAL_TABLET | Freq: Every day | ORAL | 3 refills | Status: DC

## 2017-11-18 NOTE — Patient Instructions (Signed)
Medication Instructions: Your physician recommends that you continue on your current medications as directed. Please refer to the Current Medication list given to you today.  Labwork: None Ordered  Procedures/Testing: None Ordered  Follow-Up: Your physician wants you to follow-up in: 1 YEAR with Dr. Marlou Porch. You will receive a reminder letter in the mail two months in advance. If you don't receive a letter, please call our office to schedule the follow-up appointment.   If you need a refill on your cardiac medications before your next appointment, please call your pharmacy.

## 2017-11-18 NOTE — Progress Notes (Signed)
[facility] [address], [address], [place]  [address] Phone: [phone] Fax:  [phone]  Date:  11/18/2017   ID:  Sharon [name], DOB Apr 03, 1946, MRN [id number]  PCP:  [name], MD   History of Present Illness: Sharon [name] is a 72 y.o. female here for evaluation of chest pain. She was in the emergency department on 06/28/14 with nausea, palpitations which were present for approximally 2 weeks. She checked her pulse and thought she was in atrial fibrillation, heart was irregular. She felt chest pressure with no radiation. No shortness of breath. No fevers or chills. No fainting. Since she works at [employer], she was able to monitor her heart with telemetry. I have personally reviewed some of the strips that she brought with her and they are sinus rhythm with PACs.  In emergency department, first-degree AV block was noted on EKG but no T-wave abnormalities. Cardiac markers were negative. She does have diabetes with a hemoglobin A1c of 7.2. Hypertension as well.  Since that experience, she has not had any further episodes of chest discomfort. Nuclear stress test was reassuring. She has generalized fatigue she states.  Since taking the Toprol, she has been doing quite well with her PACs, palpitations.  Thankfully, she continues to lose weight. 30 pounds in the past 3 years. Keep up the good work. She was a critical care nurse for 25 years.  11/18/17 - 63yr ago glucose 1000, hospital. Seizure.  This was in setting of depression.  She is doing better.  Still working in [facility] [employer].  No chest pain fevers chills nausea vomiting syncope.  Continues to struggle with weight gain.  Wt Readings from Last 3 Encounters:  11/18/17 272 lb (123.4 kg)  11/04/17 250 lb (113.4 kg)  11/03/17 250 lb (113.4 kg)     Past Medical History:  Diagnosis Date  . Anxiety   . Asthma   . CHF (congestive heart failure) ([place])   . Depression   . Diabetes mellitus   . GERD  (gastroesophageal reflux disease)   . Heart murmur    echo- 04/2016- mild aortic stenosis  . History of kidney stones    passed  . Hypercholesteremia   . Hypertension   . Kidney stones    "last kidney numbers normal"  . Left ear hearing loss   . Osteoarthritis   . Seizures ([place]) 04/2016   due to Hyperosmolar Nonketotic hyperglycemia- is what it was thought to be cause by. No further seizure activity.  . Thrombophlebitis     Past Surgical History:  Procedure Laterality Date  . ABDOMINAL HYSTERECTOMY    . APPENDECTOMY    . BREAST BIOPSY Right    many yrs ago per pt; neg bx  . CARPAL TUNNEL RELEASE Bilateral   . CHOLECYSTECTOMY    . COLONOSCOPY    . COLONOSCOPY WITH PROPOFOL N/A 11/03/2017   Procedure: COLONOSCOPY WITH PROPOFOL;  Surgeon: Clarene Essex, MD;  Location: [facility];  Service: Endoscopy;  Laterality: N/A;  . COLONOSCOPY WITH PROPOFOL N/A 11/04/2017   Procedure: COLONOSCOPY WITH PROPOFOL;  Surgeon: Clarene Essex, MD;  Location: [facility];  Service: Endoscopy;  Laterality: N/A;  . ESOPHAGOGASTRODUODENOSCOPY (EGD) WITH PROPOFOL N/A 11/03/2017   Procedure: ESOPHAGOGASTRODUODENOSCOPY (EGD) WITH PROPOFOL;  Surgeon: Clarene Essex, MD;  Location: [facility];  Service: Endoscopy;  Laterality: N/A;  . ESOPHAGOSCOPY    . EYE SURGERY Bilateral    Cataract with lens  . INCISION AND DRAINAGE PERIRECTAL ABSCESS    .  pyelogram      Current Outpatient Medications  Medication Sig Dispense Refill  . beta carotene w/minerals (OCUVITE) tablet Take 1 tablet by mouth daily.    . Calcium Carbonate-Vitamin D (CALTRATE 600+D) 600-400 MG-UNIT per tablet Take 2 tablets by mouth daily.     . Carboxymethylcellul-Glycerin (LUBRICATING EYE DROPS OP) Place 1-2 drops into both eyes 4 (four) times daily as needed (for dry eyes).    Marland Kitchen dexlansoprazole (DEXILANT) 60 MG capsule Take 60 mg by mouth daily.      Marland Kitchen dextromethorphan-guaiFENesin (MUCINEX DM) 30-600 MG 12hr tablet Take 1 tablet by mouth 2  (two) times daily as needed for cough.    . ferrous sulfate 325 (65 FE) MG tablet Take 325 mg by mouth daily with breakfast.    . furosemide (LASIX) 40 MG tablet Take 40 mg by mouth 2 (two) times daily.    Marland Kitchen ibuprofen (ADVIL,MOTRIN) 200 MG tablet Take 800-1,200 mg by mouth every 6 (six) hours as needed for headache or moderate pain.     Marland Kitchen insulin aspart (NOVOLOG FLEXPEN) 100 UNIT/ML FlexPen Inject 10-25 Units into the skin 3 (three) times daily with meals. Per sliding scale    . Lidocaine-Hydrocortisone Ace 3-0.5 % CREA Apply 1 application topically daily as needed (for itching).    . liraglutide (VICTOZA) 18 MG/3ML SOPN Inject 1.8 mg into the skin daily.    Marland Kitchen lisinopril (PRINIVIL,ZESTRIL) 10 MG tablet Take 10 mg by mouth daily.    . meloxicam (MOBIC) 15 MG tablet Take 15 mg by mouth daily.    . metoprolol succinate (TOPROL-XL) 25 MG 24 hr tablet Take 1 tablet (25 mg total) by mouth daily. 90 tablet 3  . ondansetron (ZOFRAN) 4 MG tablet Take 4 mg by mouth every 8 (eight) hours as needed for nausea or vomiting.   0  . PARoxetine (PAXIL) 40 MG tablet Take 80 mg by mouth every morning.     . polyethylene glycol (MIRALAX / GLYCOLAX) packet Take 17 g by mouth daily as needed for mild constipation.    . Probiotic Product (ALIGN) 4 MG CAPS Take 8 mg by mouth daily.    . TRESIBA FLEXTOUCH 200 UNIT/ML SOPN Inject 80 Units into the skin daily. Will need further dose adjustment at Follow up 5 pen 2   No current facility-administered medications for this visit.     Allergies:    Allergies  Allergen Reactions  . Crestor [Rosuvastatin] Other (See Comments)    Myalgias  . Indomethacin Other (See Comments)    dizziness  . Invanz [Ertapenem Sodium] Hives  . Lipitor [Atorvastatin Calcium] Other (See Comments)    Myalgias   . Reglan [Metoclopramide] Other (See Comments)    TREMORS    Social History:  The patient  reports that she has never smoked. She has never used smokeless tobacco. She reports that  she drank alcohol. She reports that she does not use drugs.   Family History  Problem Relation Age of Onset  . Heart attack Mother        8 HEART ATTACKS  . Stroke Mother        3 STROKES  . Heart attack Father   . Stroke Father     ROS:  Please see the history of present illness.   Denies any fevers, chills, orthopnea, PND, bleeding, syncope, strokelike symptoms. Positive generalized fatigue, palpitations.   All other systems reviewed and negative.   PHYSICAL EXAM: VS:  BP (!) 158/80   Pulse 81  Ht 4\' 11"  (1.499 m)   Wt 272 lb (123.4 kg)   LMP  (LMP Unknown)   SpO2 94%   BMI 54.94 kg/m  GEN: Well nourished, well developed, in no acute distress Overweight HEENT: normal  Neck: no JVD, carotid bruits, or masses Cardiac: RRR; 2/6 systolic murmur right upper sternal border/left upper sternal border, no rubs, or gallops, no edema  Respiratory:  clear to auscultation bilaterally, normal work of breathing GI: soft, nontender, nondistended, + BS MS: no deformity or atrophy  Skin: warm and dry, no rash, mild excoriation right pretibial region Neuro:  Alert and Oriented x 3, Strength and sensation are intact Psych: euthymic mood, full affect   EKG:  EKG performed today 11/18/2017-sinus rhythm first-degree AV block 238 ms, heart rate 70 bpm, old septal infarct pattern.  Personally viewed.  04/11/16-sinus rhythm, first-degree AV block, 228 ms, old septal infarct pattern personally viewed-prior 07/19/14-sinus rhythm, first-degree AV block, heart rate 69, otherwise normal.  ECHO:   05/14/2016: Normal EF, mild AS-mean 12 mmHg NUC: 07/24/14 - Low risk, normal EF. No ischemia.   ASSESSMENT AND PLAN:  1. Chest pain-nuclear stress test was reassuring in 2016.episode occurred during anxiety/stress, no radiation. Was concerning enough however to go to the emergency room, concerned her partner, Dr. Oletta Lamas.  She has not had any further episodes.  She is overall doing quite well. 2. Palpitations-she  had the fortune to check her telemetry strips at work and they are demonstrating PACs only. No evidence of atrial fibrillation. I do not feel strongly that she needs a Holter monitor at this time. We will continue to monitor clinically. Toprol 25 mg once a day doing well.  This seems to be suppressing her palpitations.  If she is having a rough episodes she may take an extra metoprolol. The Toprol seems to doing a very good job of helping keep her heart recall. I did explain to her that with the mild first-degree AV block of 220 ms this could worsen with beta blocker however it is very low-dose and it seems to be helping out symptomatically with her. We will continue to utilize.  No changes made. 3. Morbid Obesity- continue to encourage weight loss.  Discussed at length. 4. Mild aortic stenosis-should be of no clinical consequence at this time.  Last echo 2017 continue to show mild aortic stenosis, mean gradient 12 mmHg.  We can forego echocardiogram this year.  We will consider next year after 3 years. 5. Diabetes with hypertension-Dr. Buddy Duty.  Hemoglobin A1c 7.6.  Improved.  Had one episode of depression last year, diabetic,. 6. 78-month follow-up. She knows to call if any worrisome symptoms develop.  Signed, Candee Furbish, MD [facility]  11/18/2017 9:06 AM

## 2017-12-23 DIAGNOSIS — E119 Type 2 diabetes mellitus without complications: Secondary | ICD-10-CM | POA: Diagnosis not present

## 2017-12-23 DIAGNOSIS — D3132 Benign neoplasm of left choroid: Secondary | ICD-10-CM | POA: Diagnosis not present

## 2017-12-23 DIAGNOSIS — Z961 Presence of intraocular lens: Secondary | ICD-10-CM | POA: Diagnosis not present

## 2017-12-23 DIAGNOSIS — H04123 Dry eye syndrome of bilateral lacrimal glands: Secondary | ICD-10-CM | POA: Diagnosis not present

## 2017-12-23 DIAGNOSIS — H26493 Other secondary cataract, bilateral: Secondary | ICD-10-CM | POA: Diagnosis not present

## 2017-12-30 DIAGNOSIS — E1142 Type 2 diabetes mellitus with diabetic polyneuropathy: Secondary | ICD-10-CM | POA: Diagnosis not present

## 2017-12-30 DIAGNOSIS — Z794 Long term (current) use of insulin: Secondary | ICD-10-CM | POA: Diagnosis not present

## 2018-01-05 ENCOUNTER — Other Ambulatory Visit: Payer: Self-pay | Admitting: Gastroenterology

## 2018-01-05 DIAGNOSIS — K862 Cyst of pancreas: Secondary | ICD-10-CM

## 2018-02-24 ENCOUNTER — Ambulatory Visit
Admission: RE | Admit: 2018-02-24 | Discharge: 2018-02-24 | Disposition: A | Discharge status: Home / Self Care | Payer: Medicare Other | Source: Ambulatory Visit | Attending: Gastroenterology | Admitting: Gastroenterology

## 2018-02-24 DIAGNOSIS — K862 Cyst of pancreas: Secondary | ICD-10-CM

## 2018-02-24 MED ORDER — GADOBENATE DIMEGLUMINE 529 MG/ML IV SOLN
20.0000 mL | Freq: Once | INTRAVENOUS | Status: AC | PRN
  Administered 2018-02-24: 20 mL via INTRAVENOUS

## 2018-03-11 DIAGNOSIS — N183 Chronic kidney disease, stage 3 (moderate): Secondary | ICD-10-CM | POA: Diagnosis not present

## 2018-03-11 DIAGNOSIS — I129 Hypertensive chronic kidney disease with stage 1 through stage 4 chronic kidney disease, or unspecified chronic kidney disease: Secondary | ICD-10-CM | POA: Diagnosis not present

## 2018-03-11 DIAGNOSIS — D509 Iron deficiency anemia, unspecified: Secondary | ICD-10-CM | POA: Diagnosis not present

## 2018-03-11 DIAGNOSIS — I110 Hypertensive heart disease with heart failure: Secondary | ICD-10-CM | POA: Diagnosis not present

## 2018-03-11 DIAGNOSIS — E7800 Pure hypercholesterolemia, unspecified: Secondary | ICD-10-CM | POA: Diagnosis not present

## 2018-05-10 ENCOUNTER — Encounter (HOSPITAL_BASED_OUTPATIENT_CLINIC_OR_DEPARTMENT_OTHER): Payer: Self-pay | Admitting: Emergency Medicine

## 2018-05-10 ENCOUNTER — Other Ambulatory Visit: Payer: Self-pay

## 2018-05-10 ENCOUNTER — Emergency Department (HOSPITAL_BASED_OUTPATIENT_CLINIC_OR_DEPARTMENT_OTHER): Payer: Medicare Other

## 2018-05-10 ENCOUNTER — Inpatient Hospital Stay (HOSPITAL_BASED_OUTPATIENT_CLINIC_OR_DEPARTMENT_OTHER)
Admission: EM | Admit: 2018-05-10 | Discharge: 2018-05-12 | DRG: 041 | Disposition: A | Discharge status: Home / Self Care | Payer: Medicare Other | Attending: Internal Medicine | Admitting: Internal Medicine

## 2018-05-10 DIAGNOSIS — Z791 Long term (current) use of non-steroidal anti-inflammatories (NSAID): Secondary | ICD-10-CM | POA: Diagnosis not present

## 2018-05-10 DIAGNOSIS — I63432 Cerebral infarction due to embolism of left posterior cerebral artery: Principal | ICD-10-CM | POA: Diagnosis present

## 2018-05-10 DIAGNOSIS — E1136 Type 2 diabetes mellitus with diabetic cataract: Secondary | ICD-10-CM | POA: Diagnosis not present

## 2018-05-10 DIAGNOSIS — K219 Gastro-esophageal reflux disease without esophagitis: Secondary | ICD-10-CM | POA: Diagnosis present

## 2018-05-10 DIAGNOSIS — R531 Weakness: Secondary | ICD-10-CM | POA: Diagnosis not present

## 2018-05-10 DIAGNOSIS — Z6841 Body Mass Index (BMI) 40.0 and over, adult: Secondary | ICD-10-CM | POA: Diagnosis not present

## 2018-05-10 DIAGNOSIS — M1990 Unspecified osteoarthritis, unspecified site: Secondary | ICD-10-CM | POA: Diagnosis not present

## 2018-05-10 DIAGNOSIS — I639 Cerebral infarction, unspecified: Secondary | ICD-10-CM | POA: Diagnosis not present

## 2018-05-10 DIAGNOSIS — F419 Anxiety disorder, unspecified: Secondary | ICD-10-CM | POA: Diagnosis present

## 2018-05-10 DIAGNOSIS — F329 Major depressive disorder, single episode, unspecified: Secondary | ICD-10-CM | POA: Diagnosis present

## 2018-05-10 DIAGNOSIS — I110 Hypertensive heart disease with heart failure: Secondary | ICD-10-CM | POA: Diagnosis not present

## 2018-05-10 DIAGNOSIS — R011 Cardiac murmur, unspecified: Secondary | ICD-10-CM | POA: Diagnosis present

## 2018-05-10 DIAGNOSIS — Z8249 Family history of ischemic heart disease and other diseases of the circulatory system: Secondary | ICD-10-CM | POA: Diagnosis not present

## 2018-05-10 DIAGNOSIS — R29701 NIHSS score 1: Secondary | ICD-10-CM | POA: Diagnosis not present

## 2018-05-10 DIAGNOSIS — Z823 Family history of stroke: Secondary | ICD-10-CM | POA: Diagnosis not present

## 2018-05-10 DIAGNOSIS — E1151 Type 2 diabetes mellitus with diabetic peripheral angiopathy without gangrene: Secondary | ICD-10-CM | POA: Diagnosis not present

## 2018-05-10 DIAGNOSIS — Z90710 Acquired absence of both cervix and uterus: Secondary | ICD-10-CM

## 2018-05-10 DIAGNOSIS — E119 Type 2 diabetes mellitus without complications: Secondary | ICD-10-CM

## 2018-05-10 DIAGNOSIS — I517 Cardiomegaly: Secondary | ICD-10-CM | POA: Diagnosis not present

## 2018-05-10 DIAGNOSIS — Z87442 Personal history of urinary calculi: Secondary | ICD-10-CM

## 2018-05-10 DIAGNOSIS — Z888 Allergy status to other drugs, medicaments and biological substances status: Secondary | ICD-10-CM | POA: Diagnosis not present

## 2018-05-10 DIAGNOSIS — H9192 Unspecified hearing loss, left ear: Secondary | ICD-10-CM | POA: Diagnosis not present

## 2018-05-10 DIAGNOSIS — R2990 Unspecified symptoms and signs involving the nervous system: Secondary | ICD-10-CM | POA: Diagnosis present

## 2018-05-10 DIAGNOSIS — R930 Abnormal findings on diagnostic imaging of skull and head, not elsewhere classified: Secondary | ICD-10-CM | POA: Diagnosis not present

## 2018-05-10 DIAGNOSIS — R410 Disorientation, unspecified: Secondary | ICD-10-CM | POA: Diagnosis not present

## 2018-05-10 DIAGNOSIS — E785 Hyperlipidemia, unspecified: Secondary | ICD-10-CM | POA: Diagnosis not present

## 2018-05-10 DIAGNOSIS — I700 Atherosclerosis of aorta: Secondary | ICD-10-CM | POA: Diagnosis not present

## 2018-05-10 DIAGNOSIS — I10 Essential (primary) hypertension: Secondary | ICD-10-CM | POA: Diagnosis present

## 2018-05-10 DIAGNOSIS — Z794 Long term (current) use of insulin: Secondary | ICD-10-CM

## 2018-05-10 DIAGNOSIS — E7800 Pure hypercholesterolemia, unspecified: Secondary | ICD-10-CM | POA: Diagnosis present

## 2018-05-10 DIAGNOSIS — Z9049 Acquired absence of other specified parts of digestive tract: Secondary | ICD-10-CM

## 2018-05-10 DIAGNOSIS — I6521 Occlusion and stenosis of right carotid artery: Secondary | ICD-10-CM | POA: Diagnosis not present

## 2018-05-10 DIAGNOSIS — I5032 Chronic diastolic (congestive) heart failure: Secondary | ICD-10-CM | POA: Diagnosis not present

## 2018-05-10 DIAGNOSIS — I6330 Cerebral infarction due to thrombosis of unspecified cerebral artery: Secondary | ICD-10-CM

## 2018-05-10 LAB — CBC WITH DIFFERENTIAL/PLATELET
Abs Immature Granulocytes: 0.06 10*3/uL (ref 0.00–0.07)
BASOS PCT: 0 %
Basophils Absolute: 0 10*3/uL (ref 0.0–0.1)
EOS ABS: 0.2 10*3/uL (ref 0.0–0.5)
EOS PCT: 1 %
HCT: 32.9 % — ABNORMAL LOW (ref 36.0–46.0)
Hemoglobin: 10.6 g/dL — ABNORMAL LOW (ref 12.0–15.0)
IMMATURE GRANULOCYTES: 0 %
Lymphocytes Relative: 15 %
Lymphs Abs: 2 10*3/uL (ref 0.7–4.0)
MCH: 29.4 pg (ref 26.0–34.0)
MCHC: 32.2 g/dL (ref 30.0–36.0)
MCV: 91.4 fL (ref 80.0–100.0)
MONOS PCT: 6 %
Monocytes Absolute: 0.9 10*3/uL (ref 0.1–1.0)
NEUTROS PCT: 78 %
NRBC: 0 % (ref 0.0–0.2)
Neutro Abs: 10.7 10*3/uL — ABNORMAL HIGH (ref 1.7–7.7)
PLATELETS: 288 10*3/uL (ref 150–400)
RBC: 3.6 MIL/uL — ABNORMAL LOW (ref 3.87–5.11)
RDW: 12.7 % (ref 11.5–15.5)
WBC: 13.9 10*3/uL — ABNORMAL HIGH (ref 4.0–10.5)

## 2018-05-10 LAB — RAPID URINE DRUG SCREEN, HOSP PERFORMED
Amphetamines: NOT DETECTED
BARBITURATES: NOT DETECTED
Benzodiazepines: POSITIVE — AB
Cocaine: NOT DETECTED
Opiates: POSITIVE — AB
Tetrahydrocannabinol: NOT DETECTED

## 2018-05-10 LAB — CBG MONITORING, ED: GLUCOSE-CAPILLARY: 222 mg/dL — AB (ref 70–99)

## 2018-05-10 LAB — URINALYSIS, ROUTINE W REFLEX MICROSCOPIC
Bilirubin Urine: NEGATIVE
Glucose, UA: NEGATIVE mg/dL
Hgb urine dipstick: NEGATIVE
Ketones, ur: NEGATIVE mg/dL
LEUKOCYTES UA: NEGATIVE
NITRITE: NEGATIVE
PROTEIN: NEGATIVE mg/dL
Specific Gravity, Urine: 1.015 (ref 1.005–1.030)
pH: 7 (ref 5.0–8.0)

## 2018-05-10 LAB — PROTIME-INR
INR: 1.1
Prothrombin Time: 14.1 seconds (ref 11.4–15.2)

## 2018-05-10 LAB — COMPREHENSIVE METABOLIC PANEL
ALBUMIN: 3.4 g/dL — AB (ref 3.5–5.0)
ALT: 13 U/L (ref 0–44)
ANION GAP: 9 (ref 5–15)
AST: 16 U/L (ref 15–41)
Alkaline Phosphatase: 89 U/L (ref 38–126)
BUN: 22 mg/dL (ref 8–23)
CHLORIDE: 103 mmol/L (ref 98–111)
CO2: 24 mmol/L (ref 22–32)
Calcium: 9.1 mg/dL (ref 8.9–10.3)
Creatinine, Ser: 1.35 mg/dL — ABNORMAL HIGH (ref 0.44–1.00)
GFR calc Af Amer: 45 mL/min — ABNORMAL LOW (ref >60)
GFR calc non Af Amer: 39 mL/min — ABNORMAL LOW (ref >60)
GLUCOSE: 186 mg/dL — AB (ref 70–99)
POTASSIUM: 3.6 mmol/L (ref 3.5–5.1)
Sodium: 136 mmol/L (ref 135–145)
TOTAL PROTEIN: 6.6 g/dL (ref 6.5–8.1)
Total Bilirubin: 0.5 mg/dL (ref 0.3–1.2)

## 2018-05-10 LAB — ETHANOL: Alcohol, Ethyl (B): <10 mg/dL (ref <10)

## 2018-05-10 LAB — TROPONIN I: Troponin I: <0.03 ng/mL (ref <0.03)

## 2018-05-10 LAB — APTT: APTT: 26 s (ref 24–36)

## 2018-05-10 MED ORDER — IOPAMIDOL (ISOVUE-370) INJECTION 76%
75.0000 mL | Freq: Once | INTRAVENOUS | Status: AC | PRN
  Administered 2018-05-10: 75 mL via INTRAVENOUS

## 2018-05-10 MED ORDER — ONDANSETRON HCL 4 MG/2ML IJ SOLN
INTRAMUSCULAR | Status: AC
  Filled 2018-05-10: qty 2

## 2018-05-10 MED ORDER — ONDANSETRON HCL 4 MG/2ML IJ SOLN
4.0000 mg | Freq: Once | INTRAMUSCULAR | Status: AC
  Administered 2018-05-10: 4 mg via INTRAVENOUS

## 2018-05-10 MED ORDER — SODIUM CHLORIDE 0.9 % IV SOLN
100.0000 mL/h | INTRAVENOUS | Status: DC
  Administered 2018-05-10: 100 mL/h via INTRAVENOUS

## 2018-05-10 MED ORDER — SODIUM CHLORIDE 0.9 % IV BOLUS
500.0000 mL | Freq: Once | INTRAVENOUS | Status: AC
Start: 2018-05-10 — End: 2018-05-10
  Administered 2018-05-10: 500 mL via INTRAVENOUS

## 2018-05-10 NOTE — Consult Note (Signed)
[facility] Consult Services   Date of Service:   05/10/2018 18:26:12  Impression:     .  RO Acute Ischemic Stroke  Comments: 72 year old female who presented with altered mental status in the setting of acute headache. Presentation may be due to TIA vs stroke vs headache.  Mechanism of Stroke: Not Clear  Metrics: Last Known Well: 05/10/2018 12:00:00 [facility] Notification Time: 05/10/2018 18:25:05 Arrival Time: 05/10/2018 17:52:00 Stamp Time: 05/10/2018 18:26:12 Time First Login Attempt: 05/10/2018 18:33:00 Video Start Time: 05/10/2018 18:33:00  Symptoms: Confusion NIHSS Start Assessment Time: 05/10/2018 18:50:00 Patient is not a candidate for tPA. Patient was not deemed candidate for tPA thrombolytics because of Last Well Known Above 4.5 Hours. Video End Time: 05/10/2018 19:03:15  CT head was reviewed.  Advanced imaging was reviewed.  ER physician notified of the decision on thrombolytics management.  Our recommendations are outlined below.  Recommendations:     .  Antiplatelet Therapy Recommended  Routine Consultation with [facility] for Follow up Care  Sign Out:     .  Discussed with Emergency Department Provider    ------------------------------------------------------------------------------  History of Present Illness: Patient is a 72 years old Female.  Patient was brought by EMS for symptoms of Confusion  72 year old female with a history of HTN, HLD, and DM II who presents to the hospital because of altered mental status. Patient reported having a headache and word finding difficulty around noon and she also had trouble putting information into the computer. After work she was driving home and got lost on her way home. She called her sister-in-law who noted slurred speech and word finding difficulty.  CT head was reviewed.  Last seen normal was beyond 4.5 hours of presentation.  Examination: 1A: Level  of Consciousness - Alert; keenly responsive + 0 1B: Ask Month and Age - 1 Question Right + 1 1C: Blink Eyes & Squeeze Hands - Performs Both Tasks + 0 2: Test Horizontal Extraocular Movements - Normal + 0 3: Test Visual Fields - No Visual Loss + 0 4: Test Facial Palsy (Use Grimace if Obtunded) - Normal symmetry + 0 5A: Test Left Arm Motor Drift - No Drift for 10 Seconds + 0 5B: Test Right Arm Motor Drift - No Drift for 10 Seconds + 0 6A: Test Left Leg Motor Drift - No Drift for 5 Seconds + 0 6B: Test Right Leg Motor Drift - No Drift for 5 Seconds + 0 7: Test Limb Ataxia (FNF/Heel-Shin) - No Ataxia + 0 8: Test Sensation - Normal; No sensory loss + 0 9: Test Language/Aphasia - Normal; No aphasia + 0 10: Test Dysarthria - Normal + 0 11: Test Extinction/Inattention - No abnormality + 0  NIHSS Score: 1  Patient was informed the Neurology Consult would happen via TeleHealth consult by way of interactive audio and video telecommunications and consented to receiving care in this manner.  Due to the immediate potential for life-threatening deterioration due to underlying acute neurologic illness, I spent 35 minutes providing critical care. This time includes time for face to face visit via telemedicine, review of medical records, imaging studies and discussion of findings with providers, the patient and/or family.   Dr Tsosie Billing   [facility] [phone]

## 2018-05-10 NOTE — ED Notes (Signed)
Pt states that at noon today she started having a headache with trouble concentrating and speaking. She continued to work her shift, then was driving home and talking on the phone when she forgot where she was and got lost. Pt denies headache currently, had some nausea in CT and was given Zofran. Her NIH is 0, but pt is a little slow to answer questions. Pt is very vague about her symptoms, and when asked if she has a hx of dysphagia, pt confirmed that she does, and that at times, she chokes at home. Nurse feels more comfortable having speech therapy evaluated her ability to swallow due to pt's vagueness and potential need for thickened fluids.

## 2018-05-10 NOTE — ED Provider Notes (Signed)
[facility] EMERGENCY DEPARTMENT Provider Note   CSN: [id number] Arrival date & time: 05/10/18  1752     History   Chief Complaint Chief Complaint  Patient presents with  . Altered Mental Status    HPI Sharon [name] is a 72 y.o. female.  Pt works at [employer] and is normally very alert and active.  She started feeling strangely at the end of work around 1630.  She got into her car and felt completely lost trying to get home.  The pt finally pulled over and called a friend.  The friend came to get her and brought her here.  LSN 1630.  The pt feels like it is very difficult for her to speak.  She has to concentrate very hard to get the appropriate words out.  She said her left leg feels "heavy."     Past Medical History:  Diagnosis Date  . Anxiety   . Asthma   . CHF (congestive heart failure) ([place])   . Depression   . Diabetes mellitus   . GERD (gastroesophageal reflux disease)   . Heart murmur    echo- 04/2016- mild aortic stenosis  . History of kidney stones    passed  . Hypercholesteremia   . Hypertension   . Kidney stones    "last kidney numbers normal"  . Left ear hearing loss   . Osteoarthritis   . Seizures ([place]) 04/2016   due to Hyperosmolar Nonketotic hyperglycemia- is what it was thought to be cause by. No further seizure activity.  . Thrombophlebitis     Patient Active Problem List   Diagnosis Date Noted  . Cerebral thrombosis with cerebral infarction 05/11/2018  . Stroke-like symptoms 05/10/2018  . Hyperosmolar (nonketotic) coma ([place]) 04/15/2016  . Seizure ([place]) 04/15/2016  . AKI (acute kidney injury) ([place]) 04/15/2016  . Chronic diastolic heart failure ([place]) 04/15/2016  . Acute on chronic diastolic (congestive) heart failure ([place]) 06/04/2015  . Diverticulitis 05/28/2015  . Sigmoid diverticulitis 05/28/2015  . Hypokalemia 05/28/2015  . Diabetes mellitus type 2, controlled ([place]) 05/28/2015  . Chronic anemia 05/28/2015  . Diverticulitis of  large intestine without perforation or abscess without bleeding   . Palpitation 04/12/2015  . Morbid obesity ([place]) 04/12/2015  . Mild aortic stenosis 04/12/2015  . Type 2 diabetes mellitus without complication ([place]) 34/19/6222  . Diabetes mellitus ([place]) 06/27/2014  . Hypertension 06/27/2014  . Chest pain 06/27/2014  . Asthma 06/27/2014    Past Surgical History:  Procedure Laterality Date  . ABDOMINAL HYSTERECTOMY    . APPENDECTOMY    . BREAST BIOPSY Right    many yrs ago per pt; neg bx  . CARPAL TUNNEL RELEASE Bilateral   . CHOLECYSTECTOMY    . COLONOSCOPY    . COLONOSCOPY WITH PROPOFOL N/A 11/03/2017   Procedure: COLONOSCOPY WITH PROPOFOL;  Surgeon: Clarene Essex, MD;  Location: [facility];  Service: Endoscopy;  Laterality: N/A;  . COLONOSCOPY WITH PROPOFOL N/A 11/04/2017   Procedure: COLONOSCOPY WITH PROPOFOL;  Surgeon: Clarene Essex, MD;  Location: [facility];  Service: Endoscopy;  Laterality: N/A;  . ESOPHAGOGASTRODUODENOSCOPY (EGD) WITH PROPOFOL N/A 11/03/2017   Procedure: ESOPHAGOGASTRODUODENOSCOPY (EGD) WITH PROPOFOL;  Surgeon: Clarene Essex, MD;  Location: [facility];  Service: Endoscopy;  Laterality: N/A;  . ESOPHAGOSCOPY    . EYE SURGERY Bilateral    Cataract with lens  . INCISION AND DRAINAGE PERIRECTAL ABSCESS    . pyelogram       OB History   None  Home Medications    Prior to Admission medications   Medication Sig Start Date End Date Taking? Authorizing Provider  beta carotene w/minerals (OCUVITE) tablet Take 1 tablet by mouth daily.   Yes [provider]  Calcium Carbonate-Vitamin D (CALTRATE 600+D) 600-400 MG-UNIT per tablet Take 2 tablets by mouth daily.    Yes [provider]  Carboxymethylcellul-Glycerin (LUBRICATING EYE DROPS OP) Place 1-2 drops into both eyes 4 (four) times daily as needed (for dry eyes).   Yes [provider]  dexlansoprazole (DEXILANT) 60 MG capsule Take 60 mg by mouth daily.     Yes [provider]  dextromethorphan-guaiFENesin (MUCINEX DM) 30-600 MG 12hr tablet Take 1 tablet by mouth 2 (two) times daily as needed for cough.   Yes [provider]  ferrous sulfate 325 (65 FE) MG tablet Take 325 mg by mouth daily with breakfast.   Yes [provider]  furosemide (LASIX) 40 MG tablet Take 40 mg by mouth 2 (two) times daily.   Yes [provider]  hydrochlorothiazide (HYDRODIURIL) 25 MG tablet Take 25 mg by mouth daily.   Yes [provider]  ibuprofen (ADVIL,MOTRIN) 200 MG tablet Take 800-1,200 mg by mouth every 6 (six) hours as needed for headache or moderate pain.    Yes [provider]  insulin aspart (NOVOLOG FLEXPEN) 100 UNIT/ML FlexPen Inject 10-30 Units into the skin 3 (three) times daily with meals. Per sliding scale    Yes [provider]  Lidocaine-Hydrocortisone Ace 3-0.5 % CREA Apply 1 application topically daily as needed (for itching).   Yes [provider]  liraglutide (VICTOZA) 18 MG/3ML SOPN Inject 1.8 mg into the skin daily.   Yes [provider]  lisinopril (PRINIVIL,ZESTRIL) 10 MG tablet Take 10 mg by mouth daily.   Yes [provider]  meloxicam (MOBIC) 15 MG tablet Take 15 mg by mouth daily.   Yes [provider]  metoprolol succinate (TOPROL-XL) 25 MG 24 hr tablet Take 1 tablet (25 mg total) by mouth daily. 11/18/17  Yes Jerline Pain, MD  ondansetron (ZOFRAN) 4 MG tablet Take 4 mg by mouth every 8 (eight) hours as needed for nausea or vomiting.    Yes [provider]  PARoxetine (PAXIL) 40 MG tablet Take 80 mg by mouth every morning.    Yes [provider]  polyethylene glycol (MIRALAX / GLYCOLAX) packet Take 17 g by mouth daily as needed for mild constipation.   Yes [provider]  Probiotic Product (ALIGN) 4 MG CAPS Take 8 mg by mouth daily.   Yes [provider]  simvastatin (ZOCOR) 40 MG tablet Take 40 mg by mouth every evening.  05/01/18  Yes [provider]  TRESIBA FLEXTOUCH 200 UNIT/ML SOPN Inject 80 Units into the skin daily. Will need further dose adjustment at Follow up 04/18/16  Yes Domenic Polite, MD    Family History Family History  Problem Relation Age of Onset  . Heart attack Mother        8 HEART ATTACKS  . Stroke Mother        3 STROKES  . Heart attack Father   . Stroke Father     Social History Social History   Tobacco Use  . Smoking status: Never Smoker  . Smokeless tobacco: Never Used  Substance Use Topics  . Alcohol use: Not Currently    Frequency: Never    Comment: maybe 1 drink every few months  . Drug use: No  Allergies   Crestor [rosuvastatin]; Indomethacin; Invanz [ertapenem sodium]; Lipitor [atorvastatin calcium]; and Reglan [metoclopramide]   Review of Systems Review of Systems  Neurological: Positive for speech difficulty and weakness.  All other systems reviewed and are negative.    Physical Exam Updated Vital Signs BP (!) 146/72 (BP Location: Left Arm)   Pulse 72   Temp 98.3 F (36.8 C) (Oral)   Resp 17   Ht 4\' 11"  (1.499 m)   Wt 119.2 kg   LMP  (LMP Unknown)   SpO2 98%   BMI 53.08 kg/m   Physical Exam  Constitutional: She is oriented to person, place, and time. She appears well-developed and well-nourished.  HENT:  Head: Normocephalic and atraumatic.  Right Ear: External ear normal.  Left Ear: External ear normal.  Nose: Nose normal.  Mouth/Throat: Oropharynx is clear and moist.  Eyes: Pupils are equal, round, and reactive to light. Conjunctivae and EOM are normal.  Neck: Normal range of motion. Neck supple.  Cardiovascular: Normal rate, regular rhythm, normal heart sounds and intact distal pulses.  Pulmonary/Chest: Effort normal and breath sounds normal.  Abdominal: Soft. Bowel sounds are normal.  Musculoskeletal: Normal range of motion.  Neurological: She is alert and oriented to person, place, and time.  Pt able to speak, but it  is very slow.  She has weakness in her LLE.  Nursing note and vitals reviewed.    ED Treatments / Results  Labs (all labs ordered are listed, but only abnormal results are displayed) Labs Reviewed  RAPID URINE DRUG SCREEN, HOSP PERFORMED - Abnormal; Notable for the following components:      Result Value   Opiates POSITIVE (*)    Benzodiazepines POSITIVE (*)    All other components within normal limits  CBC WITH DIFFERENTIAL/PLATELET - Abnormal; Notable for the following components:   WBC 13.9 (*)    RBC 3.60 (*)    Hemoglobin 10.6 (*)    HCT 32.9 (*)    Neutro Abs 10.7 (*)    All other components within normal limits  COMPREHENSIVE METABOLIC PANEL - Abnormal; Notable for the following components:   Glucose, Bld 186 (*)    Creatinine, Ser 1.35 (*)    Albumin 3.4 (*)    GFR calc non Af Amer 39 (*)    GFR calc Af Amer 45 (*)    All other components within normal limits  HEMOGLOBIN A1C - Abnormal; Notable for the following components:   Hgb A1c MFr Bld 8.7 (*)    All other components within normal limits  GLUCOSE, CAPILLARY - Abnormal; Notable for the following components:   Glucose-Capillary 130 (*)    All other components within normal limits  GLUCOSE, CAPILLARY - Abnormal; Notable for the following components:   Glucose-Capillary 101 (*)    All other components within normal limits  CREATININE, SERUM - Abnormal; Notable for the following components:   Creatinine, Ser 1.13 (*)    GFR calc non Af Amer 48 (*)    GFR calc Af Amer 56 (*)    All other components within normal limits  CBC - Abnormal; Notable for the following components:   RBC 3.47 (*)    Hemoglobin 9.8 (*)    HCT 32.7 (*)    All other components within normal limits  GLUCOSE, CAPILLARY - Abnormal; Notable for the following components:   Glucose-Capillary 212 (*)    All other components within normal limits  CBG MONITORING, ED - Abnormal; Notable for the following components:  Glucose-Capillary 222 (*)      All other components within normal limits  TROPONIN I  URINALYSIS, ROUTINE W REFLEX MICROSCOPIC  PROTIME-INR  APTT  ETHANOL  HIV ANTIBODY (ROUTINE TESTING W REFLEX)  LIPID PANEL  CK    EKG EKG Interpretation  Date/Time:  Monday May 10 2018 18:07:11 EDT Ventricular Rate:  81 PR Interval:    QRS Duration: 81 QT Interval:  384 QTC Calculation: 446 R Axis:   16 Text Interpretation:  Sinus rhythm Prolonged PR interval Probable anteroseptal infarct, old No significant change since last tracing Confirmed by Isla Pence [phone]) on 05/10/2018 6:22:58 PM Also confirmed by Isla Pence [phone]), editor Hattie Perch ([id number])  on 05/11/2018 8:25:55 AM   Radiology Ct Angio Head W Or [name] Contrast  Result Date: 05/10/2018 CLINICAL DATA:  Disoriented, confusion leaving work today. History of seizures, hypertension, hypercholesterolemia and diabetes. EXAM: CT ANGIOGRAPHY HEAD AND NECK TECHNIQUE: Multidetector CT imaging of the head and neck was performed using the standard protocol during bolus administration of intravenous contrast. Multiplanar CT image reconstructions and MIPs were obtained to evaluate the vascular anatomy. Carotid stenosis measurements (when applicable) are obtained utilizing NASCET criteria, using the distal internal carotid diameter as the denominator. CONTRAST:  7mL ISOVUE-370 IOPAMIDOL (ISOVUE-370) INJECTION 76% COMPARISON:  CT HEAD May 10, 2018. FINDINGS: CTA NECK FINDINGS: AORTIC ARCH: Normal appearance of the thoracic arch, normal branch pattern. Mild calcific atherosclerosis aortic arch. The origins of the innominate, left Common carotid artery and subclavian artery are widely patent. RIGHT CAROTID SYSTEM: Common carotid artery is patent. Mild calcific atherosclerosis of the carotid bifurcation without hemodynamically significant stenosis by NASCET criteria. Normal appearance of the internal carotid artery. LEFT CAROTID SYSTEM: Common carotid artery is  patent. Mild calcific atherosclerosis of the carotid bifurcation without hemodynamically significant stenosis by NASCET criteria. Normal appearance of the internal carotid artery. VERTEBRAL ARTERIES:Left vertebral artery is dominant. Normal appearance of the vertebral arteries, widely patent. SKELETON: No acute osseous process though bone windows have not been submitted. Moderate degenerative change of the cervical spine. OTHER NECK: Soft tissues of the neck are nonacute though, not tailored for evaluation. UPPER CHEST: Included lung apices are clear. No superior mediastinal lymphadenopathy. CTA HEAD FINDINGS: ANTERIOR CIRCULATION: Patent cervical internal carotid arteries, petrous, cavernous and supra clinoid internal carotid arteries. Patent anterior communicating artery. Patent anterior and middle cerebral arteries. No large vessel occlusion, significant stenosis, contrast extravasation or aneurysm. POSTERIOR CIRCULATION: Patent vertebral arteries, vertebrobasilar junction and basilar artery, as well as main branch vessels. Patent posterior cerebral arteries. Bilateral posterior communicating arteries present, RIGHT PCOM infundibulum. Fetal origin RIGHT PCA. Severe stenosis RIGHT P2 segment. Moderate luminal irregularity posterior cerebral arteries. No large vessel occlusion, contrast extravasation or aneurysm. VENOUS SINUSES: Major dural venous sinuses are patent though not tailored for evaluation on this angiographic examination. ANATOMIC VARIANTS: None. DELAYED PHASE: No abnormal intracranial enhancement. MIP images reviewed. IMPRESSION: CTA NECK: 1. No hemodynamically significant stenosis ICA's. Patent vertebral arteries. CTA HEAD: 1. No emergent large vessel occlusion. 2. Atherosclerosis disproportionately affecting posterior cerebral arteries; severe stenosis RIGHT P2 segment. Acute findings discussed with and reconfirmed by Dr.Nechuma Boven on 05/10/2018 at 7:04 pm. Aortic Atherosclerosis (ICD10-I70.0).  Electronically Signed   By: Elon Alas M.D.   On: 05/10/2018 19:05   Ct Angio Neck W And/or [name] Contrast  Result Date: 05/10/2018 CLINICAL DATA:  Disoriented, confusion leaving work today. History of seizures, hypertension, hypercholesterolemia and diabetes. EXAM: CT ANGIOGRAPHY HEAD AND NECK TECHNIQUE: Multidetector CT imaging of the head  and neck was performed using the standard protocol during bolus administration of intravenous contrast. Multiplanar CT image reconstructions and MIPs were obtained to evaluate the vascular anatomy. Carotid stenosis measurements (when applicable) are obtained utilizing NASCET criteria, using the distal internal carotid diameter as the denominator. CONTRAST:  13mL ISOVUE-370 IOPAMIDOL (ISOVUE-370) INJECTION 76% COMPARISON:  CT HEAD May 10, 2018. FINDINGS: CTA NECK FINDINGS: AORTIC ARCH: Normal appearance of the thoracic arch, normal branch pattern. Mild calcific atherosclerosis aortic arch. The origins of the innominate, left Common carotid artery and subclavian artery are widely patent. RIGHT CAROTID SYSTEM: Common carotid artery is patent. Mild calcific atherosclerosis of the carotid bifurcation without hemodynamically significant stenosis by NASCET criteria. Normal appearance of the internal carotid artery. LEFT CAROTID SYSTEM: Common carotid artery is patent. Mild calcific atherosclerosis of the carotid bifurcation without hemodynamically significant stenosis by NASCET criteria. Normal appearance of the internal carotid artery. VERTEBRAL ARTERIES:Left vertebral artery is dominant. Normal appearance of the vertebral arteries, widely patent. SKELETON: No acute osseous process though bone windows have not been submitted. Moderate degenerative change of the cervical spine. OTHER NECK: Soft tissues of the neck are nonacute though, not tailored for evaluation. UPPER CHEST: Included lung apices are clear. No superior mediastinal lymphadenopathy. CTA HEAD FINDINGS:  ANTERIOR CIRCULATION: Patent cervical internal carotid arteries, petrous, cavernous and supra clinoid internal carotid arteries. Patent anterior communicating artery. Patent anterior and middle cerebral arteries. No large vessel occlusion, significant stenosis, contrast extravasation or aneurysm. POSTERIOR CIRCULATION: Patent vertebral arteries, vertebrobasilar junction and basilar artery, as well as main branch vessels. Patent posterior cerebral arteries. Bilateral posterior communicating arteries present, RIGHT PCOM infundibulum. Fetal origin RIGHT PCA. Severe stenosis RIGHT P2 segment. Moderate luminal irregularity posterior cerebral arteries. No large vessel occlusion, contrast extravasation or aneurysm. VENOUS SINUSES: Major dural venous sinuses are patent though not tailored for evaluation on this angiographic examination. ANATOMIC VARIANTS: None. DELAYED PHASE: No abnormal intracranial enhancement. MIP images reviewed. IMPRESSION: CTA NECK: 1. No hemodynamically significant stenosis ICA's. Patent vertebral arteries. CTA HEAD: 1. No emergent large vessel occlusion. 2. Atherosclerosis disproportionately affecting posterior cerebral arteries; severe stenosis RIGHT P2 segment. Acute findings discussed with and reconfirmed by Dr.Yahmir Sokolov on 05/10/2018 at 7:04 pm. Aortic Atherosclerosis (ICD10-I70.0). Electronically Signed   By: Elon Alas M.D.   On: 05/10/2018 19:05   Sharon [name] Contrast  Result Date: 05/11/2018 CLINICAL DATA:  72 y/o F; disorientation, confusion, possible TIA, initial exam. EXAM: MRI HEAD WITHOUT CONTRAST MRA HEAD WITHOUT CONTRAST TECHNIQUE: Multiplanar, multiecho pulse sequences of the brain and surrounding structures were obtained without intravenous contrast. Angiographic images of the head were obtained using MRA technique without contrast. COMPARISON:  05/10/2018 CT head and CTA head.  04/17/2016 MRI head. FINDINGS: MRI HEAD FINDINGS Brain: Small foci of reduced diffusion  are present within the left splenium of corpus callosum and left posterior hippocampus as well as a small focus within the left occipital lobe (series 5, image 49-54). No associated hemorrhage or mass effect. Scattered nonspecific T2 FLAIR hyperintensities in subcortical and periventricular white matter as well as the pons are compatible with mild chronic microvascular ischemic changes for age. Mild volume loss of the brain. No extra-axial collection, hydrocephalus, or herniation. Stable bilobed left paramedian inferior frontal region mass with fat signal measuring 23 x 16 mm (AP by ML series 17, image 11). Vascular: As below. Skull and upper cervical spine: Normal marrow signal. Sinuses/Orbits: Left mastoid opacification. Mild paranasal sinus mucosal thickening. Bilateral intra-ocular lens replacement. Other: None. MRA HEAD FINDINGS  Internal carotid arteries: Patent. Mild non stenotic irregularity of carotid siphons compatible with atherosclerotic changes. Anterior cerebral arteries:  Patent. Middle cerebral arteries: Patent. Anterior communicating artery: Patent. Posterior communicating arteries: Fetal right PCA. Patent left posterior communicating artery. Posterior cerebral arteries: Patent right. No flow related signal within the left P4 parieto-occipital branch. Mild left P2 stenosis. Moderate to severe right P2 stenosis. Basilar artery: Patent. Short segment of mild upper basilar stenosis. Vertebral arteries: Patent. Short segment of distal left vertebral artery stenosis. No evidence of high-grade stenosis or aneurysm. IMPRESSION: MRI head: 1. Small foci of reduced diffusion present within splenium, left posterior hippocampus, and left occipital lobe compatible with acute/early subacute infarction. No associated hemorrhage or mass effect. 2. Mild chronic microvascular ischemic changes and volume loss of the brain. 3. 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 MRA head: 1. No  flow related signal within left P4 parieto-occipital branch which may represent occlusion or high-grade stenosis. 2. Moderate to severe right P2 stenosis. 3. No additional vessel occlusion, high-grade stenosis, or aneurysm. These results will be called to the ordering clinician or representative by the Radiologist Assistant, and communication documented in the PACS or zVision Dashboard. Electronically Signed   By: Kristine Garbe M.D.   On: 05/11/2018 04:54   Dg Chest Portable 1 View  Result Date: 05/10/2018 CLINICAL DATA:  Headache and confusion since noon. EXAM: PORTABLE CHEST 1 VIEW COMPARISON:  06/15/2016 FINDINGS: Top-normal heart size with aortic atherosclerosis. Slightly low lung volumes without acute pulmonary consolidation nor overt pulmonary edema. No effusion or pneumothorax. Osteoarthritis of the AC joints bilaterally. IMPRESSION: Borderline cardiomegaly with aortic atherosclerosis. No active pulmonary disease. Electronically Signed   By: Ashley Royalty M.D.   On: 05/10/2018 18:54   Sharon Sharon [name] Head [name] Contrast  Result Date: 05/11/2018 CLINICAL DATA:  72 y/o F; disorientation, confusion, possible TIA, initial exam. EXAM: MRI HEAD WITHOUT CONTRAST MRA HEAD WITHOUT CONTRAST TECHNIQUE: Multiplanar, multiecho pulse sequences of the brain and surrounding structures were obtained without intravenous contrast. Angiographic images of the head were obtained using MRA technique without contrast. COMPARISON:  05/10/2018 CT head and CTA head.  04/17/2016 MRI head. FINDINGS: MRI HEAD FINDINGS Brain: Small foci of reduced diffusion are present within the left splenium of corpus callosum and left posterior hippocampus as well as a small focus within the left occipital lobe (series 5, image 49-54). No associated hemorrhage or mass effect. Scattered nonspecific T2 FLAIR hyperintensities in subcortical and periventricular white matter as well as the pons are compatible with mild chronic microvascular ischemic  changes for age. Mild volume loss of the brain. No extra-axial collection, hydrocephalus, or herniation. Stable bilobed left paramedian inferior frontal region mass with fat signal measuring 23 x 16 mm (AP by ML series 17, image 11). Vascular: As below. Skull and upper cervical spine: Normal marrow signal. Sinuses/Orbits: Left mastoid opacification. Mild paranasal sinus mucosal thickening. Bilateral intra-ocular lens replacement. Other: None. MRA HEAD FINDINGS Internal carotid arteries: Patent. Mild non stenotic irregularity of carotid siphons compatible with atherosclerotic changes. Anterior cerebral arteries:  Patent. Middle cerebral arteries: Patent. Anterior communicating artery: Patent. Posterior communicating arteries: Fetal right PCA. Patent left posterior communicating artery. Posterior cerebral arteries: Patent right. No flow related signal within the left P4 parieto-occipital branch. Mild left P2 stenosis. Moderate to severe right P2 stenosis. Basilar artery: Patent. Short segment of mild upper basilar stenosis. Vertebral arteries: Patent. Short segment of distal left vertebral artery stenosis. No evidence of high-grade stenosis or aneurysm.  IMPRESSION: MRI head: 1. Small foci of reduced diffusion present within splenium, left posterior hippocampus, and left occipital lobe compatible with acute/early subacute infarction. No associated hemorrhage or mass effect. 2. Mild chronic microvascular ischemic changes and volume loss of the brain. 3. 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 MRA head: 1. No flow related signal within left P4 parieto-occipital branch which may represent occlusion or high-grade stenosis. 2. Moderate to severe right P2 stenosis. 3. No additional vessel occlusion, high-grade stenosis, or aneurysm. These results will be called to the ordering clinician or representative by the Radiologist Assistant, and communication documented in the PACS or zVision  Dashboard. Electronically Signed   By: Kristine Garbe M.D.   On: 05/11/2018 04:54   Ct Head Code Stroke [name] Contrast  Result Date: 05/10/2018 CLINICAL DATA:  Code stroke. Confusion while leaving work this afternoon. History of seizures, hypertension, hypercholesterolemia and diabetes. EXAM: CT HEAD WITHOUT CONTRAST TECHNIQUE: Contiguous axial images were obtained from the base of the skull through the vertex without intravenous contrast. COMPARISON:  MRI head April 17, 2016 and CT HEAD April 15, 2016. FINDINGS: BRAIN: No intraparenchymal hemorrhage nor midline shift. The ventricles and sulci are normal for age. Patchy supratentorial white matter hypodensities less than expected for patient's age, though non-specific are most compatible with chronic small vessel ischemic disease. Lobulated LEFT inferior frontal lobe fat calcified mass is unchanged, no significant mass effect. No acute large vascular territory infarcts. No abnormal extra-axial fluid collections. Basal cisterns are patent. VASCULAR: Moderate calcific atherosclerosis of the carotid siphons. SKULL: No skull fracture. No significant scalp soft tissue swelling. SINUSES/ORBITS: Trace paranasal sinus mucosal thickening. LEFT mastoid effusion. Included ocular globes and orbital contents are non-suspicious. Status post bilateral ocular lens implants. OTHER: None. ASPECTS [facility] Stroke Program Early CT Score) - Ganglionic level infarction (caudate, lentiform nuclei, internal capsule, insula, M1-M3 cortex): 7 - Supraganglionic infarction (M4-M6 cortex): 3 Total score (0-10 with 10 being normal): 10 IMPRESSION: 1. No acute intracranial process. 2. ASPECTS is 10. 3. 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 4. Critical Value/emergent results were called by telephone at the time of interpretation on 05/10/2018 at 6:33 pm to Dr. Isla Pence , who verbally  acknowledged these results. Electronically Signed   By: Elon Alas M.D.   On: 05/10/2018 18:34   Vas US Carotid (at [facility] Only)  Result Date: 05/11/2018 Carotid Arterial Duplex Study Indications:  TIA. Risk Factors: Hypertension, hyperlipidemia, Diabetes. Performing Technologist: Maudry Mayhew MHA, RDMS, RVT, RDCS  Examination Guidelines: A complete evaluation includes B-mode imaging, spectral Doppler, color Doppler, and power Doppler as needed of all accessible portions of each vessel. Bilateral testing is considered an integral part of a complete examination. Limited examinations for reoccurring indications may be performed as noted.  Right Carotid Findings: +----------+--------+-------+--------+--------------------------------+--------+           PSV cm/sEDV    StenosisDescribe                        Comments                   cm/s                                                    +----------+--------+-------+--------+--------------------------------+--------+  CCA Prox  101     21                                                      +----------+--------+-------+--------+--------------------------------+--------+ CCA Distal78      17             smooth, heterogenous and                                                  calcific                                 +----------+--------+-------+--------+--------------------------------+--------+ ICA Prox  82      20             smooth, heterogenous and                                                  calcific                                 +----------+--------+-------+--------+--------------------------------+--------+ ICA Distal52      14                                                      +----------+--------+-------+--------+--------------------------------+--------+ ECA       99      6                                                        +----------+--------+-------+--------+--------------------------------+--------+ +----------+--------+-------+----------------+-------------------+           PSV cm/sEDV cmsDescribe        Arm Pressure (mmHG) +----------+--------+-------+----------------+-------------------+ [id number]            Multiphasic, WNL                    +----------+--------+-------+----------------+-------------------+ +---------+--------+--+--------+--+---------+ VertebralPSV cm/s49EDV cm/s11Antegrade +---------+--------+--+--------+--+---------+  Left Carotid Findings: +----------+--------+--------+--------+---------------------+------------------+           PSV cm/sEDV cm/sStenosisDescribe             Comments           +----------+--------+--------+--------+---------------------+------------------+ CCA Prox  91      13                                                      +----------+--------+--------+--------+---------------------+------------------+ CCA Distal96      21  intimal thickening +----------+--------+--------+--------+---------------------+------------------+ ICA Prox  66      17              smooth, heterogenous                                                      and calcific                            +----------+--------+--------+--------+---------------------+------------------+ ICA Distal104     32                                                      +----------+--------+--------+--------+---------------------+------------------+ ECA       103     11                                                      +----------+--------+--------+--------+---------------------+------------------+ +----------+--------+--------+----------------+-------------------+ SubclavianPSV cm/sEDV cm/sDescribe        Arm Pressure (mmHG) +----------+--------+--------+----------------+-------------------+           113              Multiphasic, WNL                    +----------+--------+--------+----------------+-------------------+ +---------+--------+---+--------+--+---------+ VertebralPSV cm/s114EDV cm/s11Antegrade +---------+--------+---+--------+--+---------+  Summary: Right Carotid: Velocities in the right ICA are consistent with a 1-39% stenosis. Left Carotid: Velocities in the left ICA are consistent with a 1-39% stenosis. Vertebrals:  Bilateral vertebral arteries demonstrate antegrade flow. Subclavians: Normal flow hemodynamics were seen in bilateral subclavian              arteries. *See table(s) above for measurements and observations.  Electronically signed by Antony Contras MD on 05/11/2018 at 2:52:19 PM.    Final     Procedures Procedures (including critical care time)  Medications Ordered in ED Medications  sodium chloride 0.9 % bolus 500 mL ( Intravenous Stopped 05/10/18 2014)    Followed by  0.9 %  sodium chloride infusion (100 mL/hr Intravenous New Bag/Given 05/10/18 2017)  acetaminophen (TYLENOL) tablet 650 mg (650 mg Oral Given 05/11/18 1102)    Or  acetaminophen (TYLENOL) solution 650 mg ( Per Tube See Alternative 05/11/18 1102)    Or  acetaminophen (TYLENOL) suppository 650 mg ( Rectal See Alternative 05/11/18 1102)  senna-docusate (Senokot-S) tablet 1 tablet (has no administration in time range)  enoxaparin (LOVENOX) injection 40 mg (40 mg Subcutaneous Given 05/11/18 1101)  aspirin suppository 300 mg ( Rectal See Alternative 05/11/18 1102)    Or  aspirin tablet 325 mg (325 mg Oral Given 05/11/18 1102)  insulin aspart (novoLOG) injection 0-9 Units (3 Units Subcutaneous Given 05/11/18 1256)  ondansetron (ZOFRAN) injection 4 mg (4 mg Intravenous Given 05/11/18 0542)  metoprolol succinate (TOPROL-XL) 24 hr tablet 25 mg (25 mg Oral Given 05/11/18 1514)  PARoxetine (PAXIL) tablet 80 mg (80 mg Oral Given 05/11/18 1514)  pantoprazole (PROTONIX) EC tablet 40 mg (40 mg Oral Given 05/11/18 1514)    polyethylene glycol (  MIRALAX / GLYCOLAX) packet 17 g (has no administration in time range)  ferrous sulfate tablet 325 mg (has no administration in time range)  polyvinyl alcohol (LIQUIFILM TEARS) 1.4 % ophthalmic solution 1 drop (has no administration in time range)  insulin glargine (LANTUS) injection 80 Units (has no administration in time range)  iopamidol (ISOVUE-370) 76 % injection 75 mL (75 mLs Intravenous Contrast Given 05/10/18 1834)  ondansetron (ZOFRAN) injection 4 mg (4 mg Intravenous Given 05/10/18 1855)   stroke: mapping our early stages of recovery book ( Does not apply Given 05/11/18 0543)     Initial Impression / Assessment and Plan / ED Course  I have reviewed the triage vital signs and the nursing notes.  Pertinent labs & imaging results that were available during my care of the patient were reviewed by me and considered in my medical decision making (see chart for details).  CRITICAL CARE Performed by: Isla Pence   Total critical care time: 30 minutes  Critical care time was exclusive of separately billable procedures and treating other patients.  Critical care was necessary to treat or prevent imminent or life-threatening deterioration.  Critical care was time spent personally by me on the following activities: development of treatment plan with patient and/or surrogate as well as nursing, discussions with consultants, evaluation of patient's response to treatment, examination of patient, obtaining history from patient or surrogate, ordering and performing treatments and interventions, ordering and review of laboratory studies, ordering and review of radiographic studies, pulse oximetry and re-evaluation of patient's condition.   Code Stroke called after I saw and examined pt.  I did speak with Dr. Leonel Ramsay (neurology at [facility]) who recommended also getting CT angio head and neck.  The pt was seen by teleneurology (Dr. Ramon Dredge) who got the hx that sx really  started at noon.  Pt's neurologic sx improved by the time she saw her.  The pt had a NIHSS score of 1 for Dr. Ramon Dredge.  The pt is not a candidate for tpa as her sx are improving and time window.  She recommended admission for stroke work up   Pt is back to her normal right now.  The ED nurse said she failed her swallow test, but she has baseline occasional dysphagia.    Pt d/w Dr. Alcario Drought (triad) for admission.  Final Clinical Impressions(s) / ED Diagnoses   Final diagnoses:  Cerebrovascular accident (CVA), unspecified mechanism [facility])    ED Discharge Orders    None       Isla Pence, MD 05/11/18 1530

## 2018-05-10 NOTE — ED Notes (Signed)
[facility] notified ([facility]) - patient ready for transport 

## 2018-05-10 NOTE — ED Notes (Signed)
Patient transported to CT 

## 2018-05-10 NOTE — ED Notes (Signed)
Checked with [facility] @ Bed Control - there are no stroke beds available at the moment, but they are working on it.  Relayed this information to Dr. Gilford Raid

## 2018-05-10 NOTE — Progress Notes (Signed)
Arrived from [place]. Alert and oriented. Denies any pain. Cal light within reach.

## 2018-05-10 NOTE — ED Triage Notes (Signed)
Reports confusion which began when leaving work at 430 pm today.  Patient alert and oriented at present and responding appropriately.  States she was driving home and got lost.

## 2018-05-10 NOTE — Plan of Care (Signed)
72 yo F with difficulty with word finding and L leg heaviness onset after work today.  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  Call neuro on arrival.  Sending to neuro-tele obs.

## 2018-05-11 ENCOUNTER — Observation Stay (HOSPITAL_BASED_OUTPATIENT_CLINIC_OR_DEPARTMENT_OTHER): Payer: Medicare Other

## 2018-05-11 ENCOUNTER — Observation Stay (HOSPITAL_COMMUNITY): Payer: Medicare Other

## 2018-05-11 DIAGNOSIS — Z888 Allergy status to other drugs, medicaments and biological substances status: Secondary | ICD-10-CM | POA: Diagnosis not present

## 2018-05-11 DIAGNOSIS — H9192 Unspecified hearing loss, left ear: Secondary | ICD-10-CM | POA: Diagnosis present

## 2018-05-11 DIAGNOSIS — I639 Cerebral infarction, unspecified: Secondary | ICD-10-CM

## 2018-05-11 DIAGNOSIS — Z90710 Acquired absence of both cervix and uterus: Secondary | ICD-10-CM | POA: Diagnosis not present

## 2018-05-11 DIAGNOSIS — R29701 NIHSS score 1: Secondary | ICD-10-CM | POA: Diagnosis present

## 2018-05-11 DIAGNOSIS — F419 Anxiety disorder, unspecified: Secondary | ICD-10-CM | POA: Diagnosis present

## 2018-05-11 DIAGNOSIS — I6330 Cerebral infarction due to thrombosis of unspecified cerebral artery: Secondary | ICD-10-CM

## 2018-05-11 DIAGNOSIS — E1136 Type 2 diabetes mellitus with diabetic cataract: Secondary | ICD-10-CM | POA: Diagnosis present

## 2018-05-11 DIAGNOSIS — Z8249 Family history of ischemic heart disease and other diseases of the circulatory system: Secondary | ICD-10-CM | POA: Diagnosis not present

## 2018-05-11 DIAGNOSIS — R2990 Unspecified symptoms and signs involving the nervous system: Secondary | ICD-10-CM | POA: Diagnosis not present

## 2018-05-11 DIAGNOSIS — I083 Combined rheumatic disorders of mitral, aortic and tricuspid valves: Secondary | ICD-10-CM | POA: Diagnosis not present

## 2018-05-11 DIAGNOSIS — M1990 Unspecified osteoarthritis, unspecified site: Secondary | ICD-10-CM | POA: Diagnosis present

## 2018-05-11 DIAGNOSIS — I6389 Other cerebral infarction: Secondary | ICD-10-CM | POA: Diagnosis not present

## 2018-05-11 DIAGNOSIS — I371 Nonrheumatic pulmonary valve insufficiency: Secondary | ICD-10-CM | POA: Diagnosis not present

## 2018-05-11 DIAGNOSIS — K219 Gastro-esophageal reflux disease without esophagitis: Secondary | ICD-10-CM | POA: Diagnosis not present

## 2018-05-11 DIAGNOSIS — I63432 Cerebral infarction due to embolism of left posterior cerebral artery: Secondary | ICD-10-CM | POA: Diagnosis not present

## 2018-05-11 DIAGNOSIS — I10 Essential (primary) hypertension: Secondary | ICD-10-CM | POA: Diagnosis not present

## 2018-05-11 DIAGNOSIS — I110 Hypertensive heart disease with heart failure: Secondary | ICD-10-CM | POA: Diagnosis not present

## 2018-05-11 DIAGNOSIS — E118 Type 2 diabetes mellitus with unspecified complications: Secondary | ICD-10-CM | POA: Diagnosis not present

## 2018-05-11 DIAGNOSIS — R011 Cardiac murmur, unspecified: Secondary | ICD-10-CM | POA: Diagnosis not present

## 2018-05-11 DIAGNOSIS — I5030 Unspecified diastolic (congestive) heart failure: Secondary | ICD-10-CM

## 2018-05-11 DIAGNOSIS — Z823 Family history of stroke: Secondary | ICD-10-CM | POA: Diagnosis not present

## 2018-05-11 DIAGNOSIS — Z6841 Body Mass Index (BMI) 40.0 and over, adult: Secondary | ICD-10-CM | POA: Diagnosis not present

## 2018-05-11 DIAGNOSIS — F329 Major depressive disorder, single episode, unspecified: Secondary | ICD-10-CM | POA: Diagnosis present

## 2018-05-11 DIAGNOSIS — Z9049 Acquired absence of other specified parts of digestive tract: Secondary | ICD-10-CM | POA: Diagnosis not present

## 2018-05-11 DIAGNOSIS — Z794 Long term (current) use of insulin: Secondary | ICD-10-CM | POA: Diagnosis not present

## 2018-05-11 DIAGNOSIS — E7800 Pure hypercholesterolemia, unspecified: Secondary | ICD-10-CM | POA: Diagnosis not present

## 2018-05-11 DIAGNOSIS — Z791 Long term (current) use of non-steroidal anti-inflammatories (NSAID): Secondary | ICD-10-CM | POA: Diagnosis not present

## 2018-05-11 DIAGNOSIS — R410 Disorientation, unspecified: Secondary | ICD-10-CM | POA: Diagnosis not present

## 2018-05-11 DIAGNOSIS — E1151 Type 2 diabetes mellitus with diabetic peripheral angiopathy without gangrene: Secondary | ICD-10-CM | POA: Diagnosis not present

## 2018-05-11 DIAGNOSIS — E785 Hyperlipidemia, unspecified: Secondary | ICD-10-CM | POA: Diagnosis present

## 2018-05-11 DIAGNOSIS — I5032 Chronic diastolic (congestive) heart failure: Secondary | ICD-10-CM | POA: Diagnosis present

## 2018-05-11 DIAGNOSIS — Z87442 Personal history of urinary calculi: Secondary | ICD-10-CM | POA: Diagnosis not present

## 2018-05-11 LAB — CBC
HEMATOCRIT: 32.7 % — AB (ref 36.0–46.0)
HEMOGLOBIN: 9.8 g/dL — AB (ref 12.0–15.0)
MCH: 28.2 pg (ref 26.0–34.0)
MCHC: 30 g/dL (ref 30.0–36.0)
MCV: 94.2 fL (ref 80.0–100.0)
Platelets: 288 10*3/uL (ref 150–400)
RBC: 3.47 MIL/uL — AB (ref 3.87–5.11)
RDW: 12.9 % (ref 11.5–15.5)
WBC: 10.4 10*3/uL (ref 4.0–10.5)
nRBC: 0 % (ref 0.0–0.2)

## 2018-05-11 LAB — LIPID PANEL
CHOL/HDL RATIO: 3.8 ratio
Cholesterol: 165 mg/dL (ref 0–200)
HDL: 44 mg/dL (ref >40)
LDL CALC: 95 mg/dL (ref 0–99)
Triglycerides: 130 mg/dL (ref <150)
VLDL: 26 mg/dL (ref 0–40)

## 2018-05-11 LAB — GLUCOSE, CAPILLARY
GLUCOSE-CAPILLARY: 172 mg/dL — AB (ref 70–99)
Glucose-Capillary: 101 mg/dL — ABNORMAL HIGH (ref 70–99)
Glucose-Capillary: 130 mg/dL — ABNORMAL HIGH (ref 70–99)
Glucose-Capillary: 212 mg/dL — ABNORMAL HIGH (ref 70–99)
Glucose-Capillary: 158 mg/dL — ABNORMAL HIGH (ref 70–99)
Glucose-Capillary: 222 mg/dL — ABNORMAL HIGH (ref 70–99)

## 2018-05-11 LAB — CK: CK TOTAL: 43 U/L (ref 38–234)

## 2018-05-11 LAB — HIV ANTIBODY (ROUTINE TESTING W REFLEX): HIV Screen 4th Generation wRfx: NONREACTIVE

## 2018-05-11 LAB — HEMOGLOBIN A1C
Hgb A1c MFr Bld: 8.7 % — ABNORMAL HIGH (ref 4.8–5.6)
Mean Plasma Glucose: 202.99 mg/dL

## 2018-05-11 LAB — ECHOCARDIOGRAM COMPLETE
Height: 59 in
WEIGHTICAEL: 4204.61 [oz_av]

## 2018-05-11 LAB — CREATININE, SERUM
Creatinine, Ser: 1.13 mg/dL — ABNORMAL HIGH (ref 0.44–1.00)
GFR calc Af Amer: 56 mL/min — ABNORMAL LOW (ref >60)
GFR calc non Af Amer: 48 mL/min — ABNORMAL LOW (ref >60)

## 2018-05-11 MED ORDER — ASPIRIN 325 MG PO TABS
325.0000 mg | ORAL_TABLET | Freq: Every day | ORAL | Status: DC
  Administered 2018-05-11: 325 mg via ORAL
  Filled 2018-05-11: qty 1

## 2018-05-11 MED ORDER — POLYVINYL ALCOHOL 1.4 % OP SOLN: 1.0000 [drp] | Freq: Four times a day (QID) | OPHTHALMIC | Status: DC | PRN

## 2018-05-11 MED ORDER — INSULIN ASPART 100 UNIT/ML ~~LOC~~ SOLN
0.0000 [IU] | SUBCUTANEOUS | Status: DC
  Administered 2018-05-11: 1 [IU] via SUBCUTANEOUS
  Administered 2018-05-11 (×2): 3 [IU] via SUBCUTANEOUS
  Administered 2018-05-11 – 2018-05-12 (×3): 2 [IU] via SUBCUTANEOUS

## 2018-05-11 MED ORDER — INSULIN GLARGINE 100 UNIT/ML ~~LOC~~ SOLN
80.0000 [IU] | Freq: Every day | SUBCUTANEOUS | Status: DC
  Administered 2018-05-11: 80 [IU] via SUBCUTANEOUS
  Filled 2018-05-11 (×3): qty 0.8

## 2018-05-11 MED ORDER — INSULIN ASPART 100 UNIT/ML ~~LOC~~ SOLN: 0.0000 [IU] | Freq: Three times a day (TID) | SUBCUTANEOUS | Status: DC

## 2018-05-11 MED ORDER — STROKE: EARLY STAGES OF RECOVERY BOOK
Freq: Once | Status: AC
  Administered 2018-05-11: 06:00:00

## 2018-05-11 MED ORDER — ENOXAPARIN SODIUM 40 MG/0.4ML ~~LOC~~ SOLN
40.0000 mg | Freq: Every day | SUBCUTANEOUS | Status: DC
  Administered 2018-05-11 – 2018-05-12 (×2): 40 mg via SUBCUTANEOUS
  Filled 2018-05-11 (×2): qty 0.4

## 2018-05-11 MED ORDER — FERROUS SULFATE 325 (65 FE) MG PO TABS: 325.0000 mg | ORAL_TABLET | Freq: Every day | ORAL | Status: DC

## 2018-05-11 MED ORDER — ACETAMINOPHEN 160 MG/5ML PO SOLN: 650.0000 mg | ORAL | Status: DC | PRN

## 2018-05-11 MED ORDER — PANTOPRAZOLE SODIUM 40 MG PO TBEC
40.0000 mg | DELAYED_RELEASE_TABLET | Freq: Every day | ORAL | Status: DC
  Administered 2018-05-11 – 2018-05-12 (×2): 40 mg via ORAL
  Filled 2018-05-11 (×2): qty 1

## 2018-05-11 MED ORDER — ASPIRIN 300 MG RE SUPP: 300.0000 mg | Freq: Every day | RECTAL | Status: DC

## 2018-05-11 MED ORDER — CLOPIDOGREL BISULFATE 75 MG PO TABS
75.0000 mg | ORAL_TABLET | Freq: Every day | ORAL | Status: DC
  Administered 2018-05-11 – 2018-05-12 (×2): 75 mg via ORAL
  Filled 2018-05-11 (×2): qty 1

## 2018-05-11 MED ORDER — SENNOSIDES-DOCUSATE SODIUM 8.6-50 MG PO TABS: 1.0000 | ORAL_TABLET | Freq: Every evening | ORAL | Status: DC | PRN

## 2018-05-11 MED ORDER — INSULIN GLARGINE 100 UNIT/ML ~~LOC~~ SOLN
60.0000 [IU] | Freq: Every day | SUBCUTANEOUS | Status: DC
  Filled 2018-05-11: qty 0.6

## 2018-05-11 MED ORDER — POLYETHYLENE GLYCOL 3350 17 G PO PACK: 17.0000 g | PACK | Freq: Every day | ORAL | Status: DC | PRN

## 2018-05-11 MED ORDER — INSULIN DEGLUDEC 200 UNIT/ML ~~LOC~~ SOPN: 80.0000 [IU] | PEN_INJECTOR | Freq: Every day | SUBCUTANEOUS | Status: DC

## 2018-05-11 MED ORDER — INSULIN ASPART 100 UNIT/ML ~~LOC~~ SOLN: 6.0000 [IU] | Freq: Three times a day (TID) | SUBCUTANEOUS | Status: DC

## 2018-05-11 MED ORDER — PAROXETINE HCL 20 MG PO TABS
80.0000 mg | ORAL_TABLET | Freq: Every morning | ORAL | Status: DC
  Administered 2018-05-11 – 2018-05-12 (×2): 80 mg via ORAL
  Filled 2018-05-11 (×2): qty 4

## 2018-05-11 MED ORDER — ACETAMINOPHEN 650 MG RE SUPP: 650.0000 mg | RECTAL | Status: DC | PRN

## 2018-05-11 MED ORDER — ACETAMINOPHEN 325 MG PO TABS
650.0000 mg | ORAL_TABLET | ORAL | Status: DC | PRN
  Administered 2018-05-11 – 2018-05-12 (×2): 650 mg via ORAL
  Filled 2018-05-11 (×2): qty 2

## 2018-05-11 MED ORDER — ONDANSETRON HCL 4 MG/2ML IJ SOLN
4.0000 mg | Freq: Four times a day (QID) | INTRAMUSCULAR | Status: DC | PRN
  Administered 2018-05-11 – 2018-05-12 (×2): 4 mg via INTRAVENOUS
  Filled 2018-05-11 (×2): qty 2

## 2018-05-11 MED ORDER — METOPROLOL SUCCINATE ER 25 MG PO TB24
25.0000 mg | ORAL_TABLET | Freq: Every day | ORAL | Status: DC
  Administered 2018-05-11 – 2018-05-12 (×2): 25 mg via ORAL
  Filled 2018-05-11 (×2): qty 1

## 2018-05-11 MED ORDER — ASPIRIN EC 81 MG PO TBEC
81.0000 mg | DELAYED_RELEASE_TABLET | Freq: Every day | ORAL | Status: DC
  Administered 2018-05-12: 81 mg via ORAL
  Filled 2018-05-11: qty 1

## 2018-05-11 MED ORDER — SIMVASTATIN 40 MG PO TABS
40.0000 mg | ORAL_TABLET | Freq: Every evening | ORAL | Status: DC
  Administered 2018-05-11 – 2018-05-12 (×2): 40 mg via ORAL
  Filled 2018-05-11 (×2): qty 1

## 2018-05-11 NOTE — Progress Notes (Signed)
*  PRELIMINARY RESULTS* Vascular Ultrasound Carotid Duplex (Doppler) has been completed.  Findings suggest 1-39% internal carotid artery stenosis bilaterally. Vertebral arteries are patent with antegrade flow.  05/11/2018 9:19 AM Maudry Mayhew, MHA, RVT, RDCS, RDMS

## 2018-05-11 NOTE — Progress Notes (Signed)
Sharon [name] is a 72 y.o. female with medical history significant of DM2, HTN, diastolic CHF, anxiety.  Patient presents to the ED at [facility] with c/o difficulty with speech and L leg weakness.  Neurology requested CTA head and neck which showed no emergent large vessel occlusion, though it did show severe R PCA stenosis P2 segment.  Patient sent to [facility] for TIA / Stroke work up.   Stroke work up pending.    Hosie Poisson, MD [phone]

## 2018-05-11 NOTE — H&P (Signed)
History and Physical    Sharon [name] RWE:[id number] DOB: 1945/07/23 DOA: 05/10/2018  PCP: [name], MD  Patient coming from: Home  I have personally briefly reviewed patient's old medical records in [place]  Chief Complaint: AMS  HPI: Sharon [name] is a 73 y.o. female with medical history significant of DM2, HTN, diastolic CHF, anxiety.  Patient presents to the ED at [facility] with c/o difficulty with speech and L leg weakness.  Patient normally works at [employer]  She started feeling strangely at the end of work around 1630.  She got into her car and felt completely lost trying to get home.  The pt finally pulled over and called a friend.  The friend came to get her and brought her here.  LSN 1630.  The pt feels like it is very difficult for her to speak.  She has to concentrate very hard to get the appropriate words out.  She said her left leg feels "heavy."   ED Course: Symptoms somewhat improved, but still persistent to some degree with speech hesitancy.  Tele neuro consult gave NIHSS of 1.  Neurology requested CTA head and neck which showed no emergent large vessel occlusion, though it did show severe R PCA stenosis P2 segment.  Patient sent to [facility] for TIA / Stroke work up.   Review of Systems: As per HPI otherwise 10 point review of systems negative.   Past Medical History:  Diagnosis Date  . Anxiety   . Asthma   . CHF (congestive heart failure) ([place])   . Depression   . Diabetes mellitus   . GERD (gastroesophageal reflux disease)   . Heart murmur    echo- 04/2016- mild aortic stenosis  . History of kidney stones    passed  . Hypercholesteremia   . Hypertension   . Kidney stones    "last kidney numbers normal"  . Left ear hearing loss   . Osteoarthritis   . Seizures ([place]) 04/2016   due to Hyperosmolar Nonketotic hyperglycemia- is what it was thought to be cause by. No further seizure activity.  . Thrombophlebitis     Past Surgical History:  Procedure  Laterality Date  . ABDOMINAL HYSTERECTOMY    . APPENDECTOMY    . BREAST BIOPSY Right    many yrs ago per pt; neg bx  . CARPAL TUNNEL RELEASE Bilateral   . CHOLECYSTECTOMY    . COLONOSCOPY    . COLONOSCOPY WITH PROPOFOL N/A 11/03/2017   Procedure: COLONOSCOPY WITH PROPOFOL;  Surgeon: Clarene Essex, MD;  Location: [facility];  Service: Endoscopy;  Laterality: N/A;  . COLONOSCOPY WITH PROPOFOL N/A 11/04/2017   Procedure: COLONOSCOPY WITH PROPOFOL;  Surgeon: Clarene Essex, MD;  Location: [facility];  Service: Endoscopy;  Laterality: N/A;  . ESOPHAGOGASTRODUODENOSCOPY (EGD) WITH PROPOFOL N/A 11/03/2017   Procedure: ESOPHAGOGASTRODUODENOSCOPY (EGD) WITH PROPOFOL;  Surgeon: Clarene Essex, MD;  Location: [facility];  Service: Endoscopy;  Laterality: N/A;  . ESOPHAGOSCOPY    . EYE SURGERY Bilateral    Cataract with lens  . INCISION AND DRAINAGE PERIRECTAL ABSCESS    . pyelogram       reports that she has never smoked. She has never used smokeless tobacco. She reports that she drank alcohol. She reports that she does not use drugs.  Allergies  Allergen Reactions  . Crestor [Rosuvastatin] Other (See Comments)    Myalgias  . Indomethacin Other (See Comments)    dizziness  . Invanz [Ertapenem Sodium] Hives  . Lipitor [Atorvastatin Calcium]  Other (See Comments)    Myalgias   . Reglan [Metoclopramide] Other (See Comments)    TREMORS    Family History  Problem Relation Age of Onset  . Heart attack Mother        8 HEART ATTACKS  . Stroke Mother        3 STROKES  . Heart attack Father   . Stroke Father      Prior to Admission medications   Medication Sig Start Date End Date Taking? Authorizing Provider  beta carotene w/minerals (OCUVITE) tablet Take 1 tablet by mouth daily.    [provider]  Calcium Carbonate-Vitamin D (CALTRATE 600+D) 600-400 MG-UNIT per tablet Take 2 tablets by mouth daily.     [provider]  Carboxymethylcellul-Glycerin (LUBRICATING EYE DROPS  OP) Place 1-2 drops into both eyes 4 (four) times daily as needed (for dry eyes).    [provider]  dexlansoprazole (DEXILANT) 60 MG capsule Take 60 mg by mouth daily.      [provider]  dextromethorphan-guaiFENesin (MUCINEX DM) 30-600 MG 12hr tablet Take 1 tablet by mouth 2 (two) times daily as needed for cough.    [provider]  ferrous sulfate 325 (65 FE) MG tablet Take 325 mg by mouth daily with breakfast.    [provider]  furosemide (LASIX) 40 MG tablet Take 40 mg by mouth 2 (two) times daily.    [provider]  ibuprofen (ADVIL,MOTRIN) 200 MG tablet Take 800-1,200 mg by mouth every 6 (six) hours as needed for headache or moderate pain.     [provider]  insulin aspart (NOVOLOG FLEXPEN) 100 UNIT/ML FlexPen Inject 10-25 Units into the skin 3 (three) times daily with meals. Per sliding scale    [provider]  Lidocaine-Hydrocortisone Ace 3-0.5 % CREA Apply 1 application topically daily as needed (for itching).    [provider]  liraglutide (VICTOZA) 18 MG/3ML SOPN Inject 1.8 mg into the skin daily.    [provider]  lisinopril (PRINIVIL,ZESTRIL) 10 MG tablet Take 10 mg by mouth daily.    [provider]  meloxicam (MOBIC) 15 MG tablet Take 15 mg by mouth daily.    [provider]  metoprolol succinate (TOPROL-XL) 25 MG 24 hr tablet Take 1 tablet (25 mg total) by mouth daily. 11/18/17   Jerline Pain, MD  ondansetron (ZOFRAN) 4 MG tablet Take 4 mg by mouth every 8 (eight) hours as needed for nausea or vomiting.     [provider]  PARoxetine (PAXIL) 40 MG tablet Take 80 mg by mouth every morning.     [provider]  polyethylene glycol (MIRALAX / GLYCOLAX) packet Take 17 g by mouth daily as needed for mild constipation.    [provider]  Probiotic Product (ALIGN) 4 MG CAPS Take 8 mg by mouth daily.    [provider]  TRESIBA FLEXTOUCH 200  UNIT/ML SOPN Inject 80 Units into the skin daily. Will need further dose adjustment at Follow up 04/18/16   Domenic Polite, MD    Physical Exam: Vitals:   05/10/18 2130 05/10/18 2220 05/10/18 2241 05/11/18 0034  BP: 127/73 (!) 140/55 (!) 140/55   Pulse: 65 72    Resp: 18 18 18    Temp:  98.5 F (36.9 C) 98.5 F (36.9 C)   TempSrc:  Oral Oral   SpO2: 93% 92% 95% 95%  Weight:  119.2 kg    Height:  4\' 11"  (1.499 m)  Constitutional: NAD, calm, comfortable Eyes: PERRL, lids and conjunctivae normal ENMT: Mucous membranes are moist. Posterior pharynx clear of any exudate or lesions.Normal dentition.  Neck: normal, supple, no masses, no thyromegaly Respiratory: clear to auscultation bilaterally, no wheezing, no crackles. Normal respiratory effort. No accessory muscle use.  Cardiovascular: Regular rate and rhythm, no murmurs / rubs / gallops. No extremity edema. 2+ pedal pulses. No carotid bruits.  Abdomen: no tenderness, no masses palpated. No hepatosplenomegaly. Bowel sounds positive.  Musculoskeletal: no clubbing / cyanosis. No joint deformity upper and lower extremities. Good ROM, no contractures. Normal muscle tone.  Skin: no rashes, lesions, ulcers. No induration Neurologic: LLE weakness, definitely has difficulty still with speech.  Speech is slowed. Psychiatric: Normal judgment and insight. Alert and oriented x 3. Normal mood.    Labs on Admission: I have personally reviewed following labs and imaging studies  CBC: Recent Labs  Lab 05/10/18 1908  WBC 13.9*  NEUTROABS 10.7*  HGB 10.6*  HCT 32.9*  MCV 91.4  PLT 427   Basic Metabolic Panel: Recent Labs  Lab 05/10/18 1908  NA 136  K 3.6  CL 103  CO2 24  GLUCOSE 186*  BUN 22  CREATININE 1.35*  CALCIUM 9.1   GFR: Estimated Creatinine Clearance: 45.1 mL/min (A) (by C-G formula based on SCr of 1.35 mg/dL (H)). Liver Function Tests: Recent Labs  Lab 05/10/18 1908  AST 16  ALT 13  ALKPHOS 89  BILITOT 0.5    PROT 6.6  ALBUMIN 3.4*   No results for input(s): LIPASE, AMYLASE in the last 168 hours. No results for input(s): AMMONIA in the last 168 hours. Coagulation Profile: Recent Labs  Lab 05/10/18 1908  INR 1.10   Cardiac Enzymes: Recent Labs  Lab 05/10/18 1815  TROPONINI <0.03   BNP (last 3 results) No results for input(s): PROBNP in the last 8760 hours. HbA1C: No results for input(s): HGBA1C in the last 72 hours. CBG: Recent Labs  Lab 05/10/18 1807  GLUCAP 222*   Lipid Profile: No results for input(s): CHOL, HDL, LDLCALC, TRIG, CHOLHDL, LDLDIRECT in the last 72 hours. Thyroid Function Tests: No results for input(s): TSH, T4TOTAL, FREET4, T3FREE, THYROIDAB in the last 72 hours. Anemia Panel: No results for input(s): VITAMINB12, FOLATE, FERRITIN, TIBC, IRON, RETICCTPCT in the last 72 hours. Urine analysis:    Component Value Date/Time   COLORURINE YELLOW 05/10/2018 1815   APPEARANCEUR CLEAR 05/10/2018 1815   LABSPEC 1.015 05/10/2018 1815   PHURINE 7.0 05/10/2018 1815   GLUCOSEU NEGATIVE 05/10/2018 1815   HGBUR NEGATIVE 05/10/2018 1815   BILIRUBINUR NEGATIVE 05/10/2018 1815   KETONESUR NEGATIVE 05/10/2018 1815   PROTEINUR NEGATIVE 05/10/2018 1815   UROBILINOGEN 1.0 05/28/2015 0122   NITRITE NEGATIVE 05/10/2018 1815   LEUKOCYTESUR NEGATIVE 05/10/2018 1815    Radiological Exams on Admission: Ct Angio Head W Or Wo Contrast  Result Date: 05/10/2018 CLINICAL DATA:  Disoriented, confusion leaving work today. History of seizures, hypertension, hypercholesterolemia and diabetes. EXAM: CT ANGIOGRAPHY HEAD AND NECK TECHNIQUE: Multidetector CT imaging of the head and neck was performed using the standard protocol during bolus administration of intravenous contrast. Multiplanar CT image reconstructions and MIPs were obtained to evaluate the vascular anatomy. Carotid stenosis measurements (when applicable) are obtained utilizing NASCET criteria, using the distal internal carotid  diameter as the denominator. CONTRAST:  61mL ISOVUE-370 IOPAMIDOL (ISOVUE-370) INJECTION 76% COMPARISON:  CT HEAD May 10, 2018. FINDINGS: CTA NECK FINDINGS: AORTIC ARCH: Normal appearance of the thoracic arch, normal branch pattern. Mild calcific  atherosclerosis aortic arch. The origins of the innominate, left Common carotid artery and subclavian artery are widely patent. RIGHT CAROTID SYSTEM: Common carotid artery is patent. Mild calcific atherosclerosis of the carotid bifurcation without hemodynamically significant stenosis by NASCET criteria. Normal appearance of the internal carotid artery. LEFT CAROTID SYSTEM: Common carotid artery is patent. Mild calcific atherosclerosis of the carotid bifurcation without hemodynamically significant stenosis by NASCET criteria. Normal appearance of the internal carotid artery. VERTEBRAL ARTERIES:Left vertebral artery is dominant. Normal appearance of the vertebral arteries, widely patent. SKELETON: No acute osseous process though bone windows have not been submitted. Moderate degenerative change of the cervical spine. OTHER NECK: Soft tissues of the neck are nonacute though, not tailored for evaluation. UPPER CHEST: Included lung apices are clear. No superior mediastinal lymphadenopathy. CTA HEAD FINDINGS: ANTERIOR CIRCULATION: Patent cervical internal carotid arteries, petrous, cavernous and supra clinoid internal carotid arteries. Patent anterior communicating artery. Patent anterior and middle cerebral arteries. No large vessel occlusion, significant stenosis, contrast extravasation or aneurysm. POSTERIOR CIRCULATION: Patent vertebral arteries, vertebrobasilar junction and basilar artery, as well as main branch vessels. Patent posterior cerebral arteries. Bilateral posterior communicating arteries present, RIGHT PCOM infundibulum. Fetal origin RIGHT PCA. Severe stenosis RIGHT P2 segment. Moderate luminal irregularity posterior cerebral arteries. No large vessel  occlusion, contrast extravasation or aneurysm. VENOUS SINUSES: Major dural venous sinuses are patent though not tailored for evaluation on this angiographic examination. ANATOMIC VARIANTS: None. DELAYED PHASE: No abnormal intracranial enhancement. MIP images reviewed. IMPRESSION: CTA NECK: 1. No hemodynamically significant stenosis ICA's. Patent vertebral arteries. CTA HEAD: 1. No emergent large vessel occlusion. 2. Atherosclerosis disproportionately affecting posterior cerebral arteries; severe stenosis RIGHT P2 segment. Acute findings discussed with and reconfirmed by Dr.JULIE HAVILAND on 05/10/2018 at 7:04 pm. Aortic Atherosclerosis (ICD10-I70.0). Electronically Signed   By: Elon Alas M.D.   On: 05/10/2018 19:05   Ct Angio Neck W And/or Wo Contrast  Result Date: 05/10/2018 CLINICAL DATA:  Disoriented, confusion leaving work today. History of seizures, hypertension, hypercholesterolemia and diabetes. EXAM: CT ANGIOGRAPHY HEAD AND NECK TECHNIQUE: Multidetector CT imaging of the head and neck was performed using the standard protocol during bolus administration of intravenous contrast. Multiplanar CT image reconstructions and MIPs were obtained to evaluate the vascular anatomy. Carotid stenosis measurements (when applicable) are obtained utilizing NASCET criteria, using the distal internal carotid diameter as the denominator. CONTRAST:  28mL ISOVUE-370 IOPAMIDOL (ISOVUE-370) INJECTION 76% COMPARISON:  CT HEAD May 10, 2018. FINDINGS: CTA NECK FINDINGS: AORTIC ARCH: Normal appearance of the thoracic arch, normal branch pattern. Mild calcific atherosclerosis aortic arch. The origins of the innominate, left Common carotid artery and subclavian artery are widely patent. RIGHT CAROTID SYSTEM: Common carotid artery is patent. Mild calcific atherosclerosis of the carotid bifurcation without hemodynamically significant stenosis by NASCET criteria. Normal appearance of the internal carotid artery. LEFT  CAROTID SYSTEM: Common carotid artery is patent. Mild calcific atherosclerosis of the carotid bifurcation without hemodynamically significant stenosis by NASCET criteria. Normal appearance of the internal carotid artery. VERTEBRAL ARTERIES:Left vertebral artery is dominant. Normal appearance of the vertebral arteries, widely patent. SKELETON: No acute osseous process though bone windows have not been submitted. Moderate degenerative change of the cervical spine. OTHER NECK: Soft tissues of the neck are nonacute though, not tailored for evaluation. UPPER CHEST: Included lung apices are clear. No superior mediastinal lymphadenopathy. CTA HEAD FINDINGS: ANTERIOR CIRCULATION: Patent cervical internal carotid arteries, petrous, cavernous and supra clinoid internal carotid arteries. Patent anterior communicating artery. Patent anterior and middle cerebral arteries. No large  vessel occlusion, significant stenosis, contrast extravasation or aneurysm. POSTERIOR CIRCULATION: Patent vertebral arteries, vertebrobasilar junction and basilar artery, as well as main branch vessels. Patent posterior cerebral arteries. Bilateral posterior communicating arteries present, RIGHT PCOM infundibulum. Fetal origin RIGHT PCA. Severe stenosis RIGHT P2 segment. Moderate luminal irregularity posterior cerebral arteries. No large vessel occlusion, contrast extravasation or aneurysm. VENOUS SINUSES: Major dural venous sinuses are patent though not tailored for evaluation on this angiographic examination. ANATOMIC VARIANTS: None. DELAYED PHASE: No abnormal intracranial enhancement. MIP images reviewed. IMPRESSION: CTA NECK: 1. No hemodynamically significant stenosis ICA's. Patent vertebral arteries. CTA HEAD: 1. No emergent large vessel occlusion. 2. Atherosclerosis disproportionately affecting posterior cerebral arteries; severe stenosis RIGHT P2 segment. Acute findings discussed with and reconfirmed by Dr.JULIE HAVILAND on 05/10/2018 at 7:04  pm. Aortic Atherosclerosis (ICD10-I70.0). Electronically Signed   By: Elon Alas M.D.   On: 05/10/2018 19:05   Dg Chest Portable 1 View  Result Date: 05/10/2018 CLINICAL DATA:  Headache and confusion since noon. EXAM: PORTABLE CHEST 1 VIEW COMPARISON:  06/15/2016 FINDINGS: Top-normal heart size with aortic atherosclerosis. Slightly low lung volumes without acute pulmonary consolidation nor overt pulmonary edema. No effusion or pneumothorax. Osteoarthritis of the AC joints bilaterally. IMPRESSION: Borderline cardiomegaly with aortic atherosclerosis. No active pulmonary disease. Electronically Signed   By: Ashley Royalty M.D.   On: 05/10/2018 18:54   Ct Head Code Stroke Wo Contrast  Result Date: 05/10/2018 CLINICAL DATA:  Code stroke. Confusion while leaving work this afternoon. History of seizures, hypertension, hypercholesterolemia and diabetes. EXAM: CT HEAD WITHOUT CONTRAST TECHNIQUE: Contiguous axial images were obtained from the base of the skull through the vertex without intravenous contrast. COMPARISON:  MRI head April 17, 2016 and CT HEAD April 15, 2016. FINDINGS: BRAIN: No intraparenchymal hemorrhage nor midline shift. The ventricles and sulci are normal for age. Patchy supratentorial white matter hypodensities less than expected for patient's age, though non-specific are most compatible with chronic small vessel ischemic disease. Lobulated LEFT inferior frontal lobe fat calcified mass is unchanged, no significant mass effect. No acute large vascular territory infarcts. No abnormal extra-axial fluid collections. Basal cisterns are patent. VASCULAR: Moderate calcific atherosclerosis of the carotid siphons. SKULL: No skull fracture. No significant scalp soft tissue swelling. SINUSES/ORBITS: Trace paranasal sinus mucosal thickening. LEFT mastoid effusion. Included ocular globes and orbital contents are non-suspicious. Status post bilateral ocular lens implants. OTHER: None. ASPECTS [facility]  Stroke Program Early CT Score) - Ganglionic level infarction (caudate, lentiform nuclei, internal capsule, insula, M1-M3 cortex): 7 - Supraganglionic infarction (M4-M6 cortex): 3 Total score (0-10 with 10 being normal): 10 IMPRESSION: 1. No acute intracranial process. 2. ASPECTS is 10. 3. 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 4. Critical Value/emergent results were called by telephone at the time of interpretation on 05/10/2018 at 6:33 pm to Dr. Isla Pence , who verbally acknowledged these results. Electronically Signed   By: Elon Alas M.D.   On: 05/10/2018 18:34    EKG: Independently reviewed.  Assessment/Plan Principal Problem:   Stroke-like symptoms Active Problems:   Hypertension   Diabetes mellitus type 2, controlled ([place])    1. Stroke like symptoms - 1. Suspicious for ischemic stroke 2. Stroke pathway 3. MRI ordered 4. Neuro consulted by EDP, re-page them after MRI results (esp if stroke confirmed). 5. ASA 325 6. Remainder of stroke work up ordered 7. UDS noted to be positive for opiates and benzos, med rec pending still.  But has  focal neurologic exam which wouldn't be expected as a benzo/opiate side effect. 2. DM2 -  1. Takes 80u degludic QAM daily and mealtime SSI 2. While in hospital: 1. Will put on Lantus 60u Daily 2. 6u mealtime 3. And resistant dose SSI 4. Holding PO hypoglycemics 3. HTN - 1. Hold BP meds and allow permissive HTN for the moment pending MRI results. 2. BP currently 140/55 1. Pretty wide pulse pressure noted, as well as the h/o mild AS back in 2016, keep an eye out for 2d echo results to see if this has progressed any.  DVT prophylaxis: Lovenox Code Status: Full Family Communication: No family in room Disposition Plan: Home after admit Consults called: Neuro consulted by EDP, plan to re-page them when MRI results available. Admission status: Place in obs,  convert to IP if MRI positive for stroke.   Etta Quill DO [facility] Pager [phone] Only works nights!  If 7AM-7PM, please contact the primary day team physician taking care of patient  [email or web address] Password [email or web address]  05/11/2018, 12:52 AM

## 2018-05-11 NOTE — Progress Notes (Addendum)
STROKE TEAM PROGRESS NOTE  HPI:( Dr Cheral Marker ) Sharon [name] is an 72 y.o. female with morbid obesity, DM2, diastolic CHF, HTN and prior seizure due to [facility] who presented initially to [facility] with new onset confusion, speech deficit and LLE weakness. Symptoms began on Monday after work (she works at [employer]) at about 4:30 PM. When attempting to drive home, she got lost, requiring her to call a friend for help. Her friend then brought her to the ED at [facility]. A Teleneurology consult was initiated. She had an NIHSS of 1. CTA head and neck showed no LVO but with some regions of moderate to severe atherosclerotic narrowing were noted in the posterior circulation. She also had difficulty with speech, but this symptom has since resolved. She has had some LLE weakness which still had not resolved at the time of transfer from [facility] to [facility].  At the time of Neurological evaluation, she denies headache, vision changes, current speech deficit, CP, limb numbness or limb weakness.  LSN: [id number] on Monday tPA Given: No: NIHSS at OSH was 1.   INTERVAL HISTORY Her pastor is at the bedside.  She shared HPI with Dr. Leonie Man. No hx atrial fibrillation, only PACs.   Vitals:   05/11/18 0500 05/11/18 0640 05/11/18 0748 05/11/18 1100  BP: 136/65 (!) 147/64 129/62 (!) 146/72  Pulse:   65 72  Resp: 18 18 16 17   Temp: 98.6 F (37 C)   98.3 F (36.8 C)  TempSrc: Oral   Oral  SpO2: 97% 94% 93% 98%  Weight:      Height:        CBC:  Recent Labs  Lab 05/10/18 1908 05/11/18 0614  WBC 13.9* 10.4  NEUTROABS 10.7*  --   HGB 10.6* 9.8*  HCT 32.9* 32.7*  MCV 91.4 94.2  PLT 288 989    Basic Metabolic Panel:  Recent Labs  Lab 05/10/18 1908 05/11/18 0614  NA 136  --   K 3.6  --   CL 103  --   CO2 24  --   GLUCOSE 186*  --   BUN 22  --   CREATININE 1.35* 1.13*  CALCIUM 9.1  --    Lipid Panel:     Component Value Date/Time   CHOL 165 05/11/2018 0614   TRIG 130 05/11/2018 0614   HDL 44 05/11/2018 0614   CHOLHDL  3.8 05/11/2018 0614   VLDL 26 05/11/2018 0614   LDLCALC 95 05/11/2018 0614   HgbA1c:  Lab Results  Component Value Date   HGBA1C 8.7 (H) 05/11/2018   Urine Drug Screen:     Component Value Date/Time   LABOPIA POSITIVE (A) 05/10/2018 1815   COCAINSCRNUR NONE DETECTED 05/10/2018 1815   LABBENZ POSITIVE (A) 05/10/2018 1815   AMPHETMU NONE DETECTED 05/10/2018 1815   THCU NONE DETECTED 05/10/2018 1815   LABBARB NONE DETECTED 05/10/2018 1815    Alcohol Level     Component Value Date/Time   ETH <10 05/10/2018 1908    IMAGING Ct Angio Head W Or Wo Contrast  Result Date: 05/10/2018 CLINICAL DATA:  Disoriented, confusion leaving work today. History of seizures, hypertension, hypercholesterolemia and diabetes. EXAM: CT ANGIOGRAPHY HEAD AND NECK TECHNIQUE: Multidetector CT imaging of the head and neck was performed using the standard protocol during bolus administration of intravenous contrast. Multiplanar CT image reconstructions and MIPs were obtained to evaluate the vascular anatomy. Carotid stenosis measurements (when applicable) are obtained utilizing NASCET criteria, using the distal internal carotid diameter as the  denominator. CONTRAST:  7mL ISOVUE-370 IOPAMIDOL (ISOVUE-370) INJECTION 76% COMPARISON:  CT HEAD May 10, 2018. FINDINGS: CTA NECK FINDINGS: AORTIC ARCH: Normal appearance of the thoracic arch, normal branch pattern. Mild calcific atherosclerosis aortic arch. The origins of the innominate, left Common carotid artery and subclavian artery are widely patent. RIGHT CAROTID SYSTEM: Common carotid artery is patent. Mild calcific atherosclerosis of the carotid bifurcation without hemodynamically significant stenosis by NASCET criteria. Normal appearance of the internal carotid artery. LEFT CAROTID SYSTEM: Common carotid artery is patent. Mild calcific atherosclerosis of the carotid bifurcation without hemodynamically significant stenosis by NASCET criteria. Normal appearance of the  internal carotid artery. VERTEBRAL ARTERIES:Left vertebral artery is dominant. Normal appearance of the vertebral arteries, widely patent. SKELETON: No acute osseous process though bone windows have not been submitted. Moderate degenerative change of the cervical spine. OTHER NECK: Soft tissues of the neck are nonacute though, not tailored for evaluation. UPPER CHEST: Included lung apices are clear. No superior mediastinal lymphadenopathy. CTA HEAD FINDINGS: ANTERIOR CIRCULATION: Patent cervical internal carotid arteries, petrous, cavernous and supra clinoid internal carotid arteries. Patent anterior communicating artery. Patent anterior and middle cerebral arteries. No large vessel occlusion, significant stenosis, contrast extravasation or aneurysm. POSTERIOR CIRCULATION: Patent vertebral arteries, vertebrobasilar junction and basilar artery, as well as main branch vessels. Patent posterior cerebral arteries. Bilateral posterior communicating arteries present, RIGHT PCOM infundibulum. Fetal origin RIGHT PCA. Severe stenosis RIGHT P2 segment. Moderate luminal irregularity posterior cerebral arteries. No large vessel occlusion, contrast extravasation or aneurysm. VENOUS SINUSES: Major dural venous sinuses are patent though not tailored for evaluation on this angiographic examination. ANATOMIC VARIANTS: None. DELAYED PHASE: No abnormal intracranial enhancement. MIP images reviewed. IMPRESSION: CTA NECK: 1. No hemodynamically significant stenosis ICA's. Patent vertebral arteries. CTA HEAD: 1. No emergent large vessel occlusion. 2. Atherosclerosis disproportionately affecting posterior cerebral arteries; severe stenosis RIGHT P2 segment. Acute findings discussed with and reconfirmed by Dr.JULIE HAVILAND on 05/10/2018 at 7:04 pm. Aortic Atherosclerosis (ICD10-I70.0). Electronically Signed   By: Elon Alas M.D.   On: 05/10/2018 19:05   Ct Angio Neck W And/or Wo Contrast  Result Date: 05/10/2018 CLINICAL DATA:   Disoriented, confusion leaving work today. History of seizures, hypertension, hypercholesterolemia and diabetes. EXAM: CT ANGIOGRAPHY HEAD AND NECK TECHNIQUE: Multidetector CT imaging of the head and neck was performed using the standard protocol during bolus administration of intravenous contrast. Multiplanar CT image reconstructions and MIPs were obtained to evaluate the vascular anatomy. Carotid stenosis measurements (when applicable) are obtained utilizing NASCET criteria, using the distal internal carotid diameter as the denominator. CONTRAST:  23mL ISOVUE-370 IOPAMIDOL (ISOVUE-370) INJECTION 76% COMPARISON:  CT HEAD May 10, 2018. FINDINGS: CTA NECK FINDINGS: AORTIC ARCH: Normal appearance of the thoracic arch, normal branch pattern. Mild calcific atherosclerosis aortic arch. The origins of the innominate, left Common carotid artery and subclavian artery are widely patent. RIGHT CAROTID SYSTEM: Common carotid artery is patent. Mild calcific atherosclerosis of the carotid bifurcation without hemodynamically significant stenosis by NASCET criteria. Normal appearance of the internal carotid artery. LEFT CAROTID SYSTEM: Common carotid artery is patent. Mild calcific atherosclerosis of the carotid bifurcation without hemodynamically significant stenosis by NASCET criteria. Normal appearance of the internal carotid artery. VERTEBRAL ARTERIES:Left vertebral artery is dominant. Normal appearance of the vertebral arteries, widely patent. SKELETON: No acute osseous process though bone windows have not been submitted. Moderate degenerative change of the cervical spine. OTHER NECK: Soft tissues of the neck are nonacute though, not tailored for evaluation. UPPER CHEST: Included lung apices are clear. No  superior mediastinal lymphadenopathy. CTA HEAD FINDINGS: ANTERIOR CIRCULATION: Patent cervical internal carotid arteries, petrous, cavernous and supra clinoid internal carotid arteries. Patent anterior communicating  artery. Patent anterior and middle cerebral arteries. No large vessel occlusion, significant stenosis, contrast extravasation or aneurysm. POSTERIOR CIRCULATION: Patent vertebral arteries, vertebrobasilar junction and basilar artery, as well as main branch vessels. Patent posterior cerebral arteries. Bilateral posterior communicating arteries present, RIGHT PCOM infundibulum. Fetal origin RIGHT PCA. Severe stenosis RIGHT P2 segment. Moderate luminal irregularity posterior cerebral arteries. No large vessel occlusion, contrast extravasation or aneurysm. VENOUS SINUSES: Major dural venous sinuses are patent though not tailored for evaluation on this angiographic examination. ANATOMIC VARIANTS: None. DELAYED PHASE: No abnormal intracranial enhancement. MIP images reviewed. IMPRESSION: CTA NECK: 1. No hemodynamically significant stenosis ICA's. Patent vertebral arteries. CTA HEAD: 1. No emergent large vessel occlusion. 2. Atherosclerosis disproportionately affecting posterior cerebral arteries; severe stenosis RIGHT P2 segment. Acute findings discussed with and reconfirmed by Dr.JULIE HAVILAND on 05/10/2018 at 7:04 pm. Aortic Atherosclerosis (ICD10-I70.0). Electronically Signed   By: Elon Alas M.D.   On: 05/10/2018 19:05   Mr Brain Wo Contrast  Result Date: 05/11/2018 CLINICAL DATA:  72 y/o F; disorientation, confusion, possible TIA, initial exam. EXAM: MRI HEAD WITHOUT CONTRAST MRA HEAD WITHOUT CONTRAST TECHNIQUE: Multiplanar, multiecho pulse sequences of the brain and surrounding structures were obtained without intravenous contrast. Angiographic images of the head were obtained using MRA technique without contrast. COMPARISON:  05/10/2018 CT head and CTA head.  04/17/2016 MRI head. FINDINGS: MRI HEAD FINDINGS Brain: Small foci of reduced diffusion are present within the left splenium of corpus callosum and left posterior hippocampus as well as a small focus within the left occipital lobe (series 5, image  49-54). No associated hemorrhage or mass effect. Scattered nonspecific T2 FLAIR hyperintensities in subcortical and periventricular white matter as well as the pons are compatible with mild chronic microvascular ischemic changes for age. Mild volume loss of the brain. No extra-axial collection, hydrocephalus, or herniation. Stable bilobed left paramedian inferior frontal region mass with fat signal measuring 23 x 16 mm (AP by ML series 17, image 11). Vascular: As below. Skull and upper cervical spine: Normal marrow signal. Sinuses/Orbits: Left mastoid opacification. Mild paranasal sinus mucosal thickening. Bilateral intra-ocular lens replacement. Other: None. MRA HEAD FINDINGS Internal carotid arteries: Patent. Mild non stenotic irregularity of carotid siphons compatible with atherosclerotic changes. Anterior cerebral arteries:  Patent. Middle cerebral arteries: Patent. Anterior communicating artery: Patent. Posterior communicating arteries: Fetal right PCA. Patent left posterior communicating artery. Posterior cerebral arteries: Patent right. No flow related signal within the left P4 parieto-occipital branch. Mild left P2 stenosis. Moderate to severe right P2 stenosis. Basilar artery: Patent. Short segment of mild upper basilar stenosis. Vertebral arteries: Patent. Short segment of distal left vertebral artery stenosis. No evidence of high-grade stenosis or aneurysm. IMPRESSION: MRI head: 1. Small foci of reduced diffusion present within splenium, left posterior hippocampus, and left occipital lobe compatible with acute/early subacute infarction. No associated hemorrhage or mass effect. 2. Mild chronic microvascular ischemic changes and volume loss of the brain. 3. 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 MRA head: 1. No flow related signal within left P4 parieto-occipital branch which may represent occlusion or high-grade stenosis. 2. Moderate to severe right P2 stenosis. 3. No  additional vessel occlusion, high-grade stenosis, or aneurysm. These results will be called to the ordering clinician or representative by the Radiologist Assistant, and communication documented in the PACS or zVision Dashboard. Electronically Signed  By: Kristine Garbe M.D.   On: 05/11/2018 04:54   Dg Chest Portable 1 View  Result Date: 05/10/2018 CLINICAL DATA:  Headache and confusion since noon. EXAM: PORTABLE CHEST 1 VIEW COMPARISON:  06/15/2016 FINDINGS: Top-normal heart size with aortic atherosclerosis. Slightly low lung volumes without acute pulmonary consolidation nor overt pulmonary edema. No effusion or pneumothorax. Osteoarthritis of the AC joints bilaterally. IMPRESSION: Borderline cardiomegaly with aortic atherosclerosis. No active pulmonary disease. Electronically Signed   By: Ashley Royalty M.D.   On: 05/10/2018 18:54   Mr Jodene Nam Head Wo Contrast  Result Date: 05/11/2018 CLINICAL DATA:  72 y/o F; disorientation, confusion, possible TIA, initial exam. EXAM: MRI HEAD WITHOUT CONTRAST MRA HEAD WITHOUT CONTRAST TECHNIQUE: Multiplanar, multiecho pulse sequences of the brain and surrounding structures were obtained without intravenous contrast. Angiographic images of the head were obtained using MRA technique without contrast. COMPARISON:  05/10/2018 CT head and CTA head.  04/17/2016 MRI head. FINDINGS: MRI HEAD FINDINGS Brain: Small foci of reduced diffusion are present within the left splenium of corpus callosum and left posterior hippocampus as well as a small focus within the left occipital lobe (series 5, image 49-54). No associated hemorrhage or mass effect. Scattered nonspecific T2 FLAIR hyperintensities in subcortical and periventricular white matter as well as the pons are compatible with mild chronic microvascular ischemic changes for age. Mild volume loss of the brain. No extra-axial collection, hydrocephalus, or herniation. Stable bilobed left paramedian inferior frontal region  mass with fat signal measuring 23 x 16 mm (AP by ML series 17, image 11). Vascular: As below. Skull and upper cervical spine: Normal marrow signal. Sinuses/Orbits: Left mastoid opacification. Mild paranasal sinus mucosal thickening. Bilateral intra-ocular lens replacement. Other: None. MRA HEAD FINDINGS Internal carotid arteries: Patent. Mild non stenotic irregularity of carotid siphons compatible with atherosclerotic changes. Anterior cerebral arteries:  Patent. Middle cerebral arteries: Patent. Anterior communicating artery: Patent. Posterior communicating arteries: Fetal right PCA. Patent left posterior communicating artery. Posterior cerebral arteries: Patent right. No flow related signal within the left P4 parieto-occipital branch. Mild left P2 stenosis. Moderate to severe right P2 stenosis. Basilar artery: Patent. Short segment of mild upper basilar stenosis. Vertebral arteries: Patent. Short segment of distal left vertebral artery stenosis. No evidence of high-grade stenosis or aneurysm. IMPRESSION: MRI head: 1. Small foci of reduced diffusion present within splenium, left posterior hippocampus, and left occipital lobe compatible with acute/early subacute infarction. No associated hemorrhage or mass effect. 2. Mild chronic microvascular ischemic changes and volume loss of the brain. 3. 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 MRA head: 1. No flow related signal within left P4 parieto-occipital branch which may represent occlusion or high-grade stenosis. 2. Moderate to severe right P2 stenosis. 3. No additional vessel occlusion, high-grade stenosis, or aneurysm. These results will be called to the ordering clinician or representative by the Radiologist Assistant, and communication documented in the PACS or zVision Dashboard. Electronically Signed   By: Kristine Garbe M.D.   On: 05/11/2018 04:54   Ct Head Code Stroke Wo Contrast  Result Date:  05/10/2018 CLINICAL DATA:  Code stroke. Confusion while leaving work this afternoon. History of seizures, hypertension, hypercholesterolemia and diabetes. EXAM: CT HEAD WITHOUT CONTRAST TECHNIQUE: Contiguous axial images were obtained from the base of the skull through the vertex without intravenous contrast. COMPARISON:  MRI head April 17, 2016 and CT HEAD April 15, 2016. FINDINGS: BRAIN: No intraparenchymal hemorrhage nor midline shift. The ventricles and sulci are normal for age.  Patchy supratentorial white matter hypodensities less than expected for patient's age, though non-specific are most compatible with chronic small vessel ischemic disease. Lobulated LEFT inferior frontal lobe fat calcified mass is unchanged, no significant mass effect. No acute large vascular territory infarcts. No abnormal extra-axial fluid collections. Basal cisterns are patent. VASCULAR: Moderate calcific atherosclerosis of the carotid siphons. SKULL: No skull fracture. No significant scalp soft tissue swelling. SINUSES/ORBITS: Trace paranasal sinus mucosal thickening. LEFT mastoid effusion. Included ocular globes and orbital contents are non-suspicious. Status post bilateral ocular lens implants. OTHER: None. ASPECTS [facility] Stroke Program Early CT Score) - Ganglionic level infarction (caudate, lentiform nuclei, internal capsule, insula, M1-M3 cortex): 7 - Supraganglionic infarction (M4-M6 cortex): 3 Total score (0-10 with 10 being normal): 10 IMPRESSION: 1. No acute intracranial process. 2. ASPECTS is 10. 3. 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 4. Critical Value/emergent results were called by telephone at the time of interpretation on 05/10/2018 at 6:33 pm to Dr. Isla Pence , who verbally acknowledged these results. Electronically Signed   By: Elon Alas M.D.   On: 05/10/2018 18:34   2D echocardiogram - Left ventricle: The cavity  size was normal. There was mild focal basal hypertrophy of the septum. Systolic function was normal. The estimated ejection fraction was in the range of 60% to 65%. Wall motion was normal; there were no regional wall motion abnormalities. There was an increased relative contribution of atrial contraction to ventricular filling. Doppler parameters are consistent with abnormal left ventricular relaxation (grade 1 diastolic dysfunction). Doppler parameters are consistent with high ventricular filling pressure. - Aortic valve: There was mild stenosis. Mean gradient (S): 15 mm Hg. Valve area (VTI): 1.27 cm^2. Valve area (Vmax): 1.22 cm^2. Valve area (Vmean): 1.38 cm^2. - Mitral valve: Calcified annulus. Mildly calcified leaflets . - Pulmonary arteries: Systolic pressure could not be accurately estimated. - Inferior vena cava: The vessel was mildly dilated.  Carotid Doppler   There is 1-39% bilateral ICA stenosis. Vertebral artery flow is antegrade.    PHYSICAL EXAM  pleasant elderly lady currently not in distress. . Afebrile. Head is nontraumatic. Neck is supple without bruit.    Cardiac exam no murmur or gallop. Lungs are clear to auscultation. Distal pulses are well felt. Neurological Exam :  Awake alert oriented 3 with normal speech and language function.diminished recall 1/3. Pupils equal reactive. Fundi not visualized. Extraocular moments are full range without nystagmus. Visual field testing shows partial right inferior quadrantanopsia. Face is symmetric. Tongue is midline. Motor system exam reveals symmetric upper and lower eczema distended without focal weakness. Deep tendon for this is symmetric. Sensation is intact. Gait not tested. ASSESSMENT/PLAN Ms. Sharon [name] is a 72 y.o. female with history of morbid obesity, diabetes, diastolic CHF, hypertension, seizure due to [facility], presenting with confusion, speech difficulties and LLE weakness.   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 ASPECTS 10.     CTA head no ELVO.  Atherosclerosis posterior cerebral arteries with severe stenosis right P2  CTA neck no significant stenosis  MRI small left splenium infarct (left posterior hippocampus, left occipital).  Small vessel disease.  Atrophy.  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  MRA no flow related signal L P4.  Moderate to severe R P2 stenosis.  Carotid Doppler  B ICA 1-39% stenosis, VAs antegrade   2D Echo EF 60 to 65% with no source of embolus  TEE to look for embolic source. Arranged with [place] for tomorrow.  If positive for PFO (patent foramen ovale), check bilateral lower extremity venous dopplers to rule out DVT as possible source of stroke. (I have made patient NPO after midnight tonight).  If TEE negative, a [facility] electrophysiologist will consult and consider placement of an implantable loop recorder to evaluate for atrial fibrillation as etiology of stroke. This has been explained to patient/family by Dr. Leonie Man and they are agreeable.  LDL 95  HgbA1c 8.7  HIV pending   Lovenox 40 mg sq daily for VTE prophylaxis  No antithrombotic prior to admission, now on aspirin 325 mg daily. Given mild stroke, recommend aspirin 81 mg and plavix 75 mg daily x 3 weeks, then aspirin alone. Orders adjusted.   Therapy recommendations: No therapy needs  Disposition: Return home  Patient advised to not drive due to field cut identified during Dr. Clydene Fake exam  Needs field cut eval, recommend OP opthalmology followup to check optomteric visual fieldsassess further for driving  Palpitations  Past eval shows PACs  On metoprolol  ? AF as source os stroke  Hypertension  Stable . Permissive hypertension (OK if < 220/120) but gradually  normalize in 5-7 days . Long-term BP goal normotensive  Hyperlipidemia  Home meds: Zocor 40  Resumed home Zocor 40   intolerant to statin (crestor and Lipitor) in the past  LDL 95, goal < 70  Continue statin at discharge  Diabetes type II  HgbA1c 8.7, goal < 7.0  Uncontrolled  Other Stroke Risk Factors  Advanced age  ETOH use, advised to drink no more than 1 drink(s) a day  UDS positive for opiates and benzodiazepines   Morbid Obesity, Body mass index is 53.08 kg/m., recommend weight loss, diet and exercise as appropriate   Family hx stroke (mother and father)  Diastolic Congestive heart failure  Mild aortic stenosis  Other Active Problems  Hx seizures d/t [facility] day # 0  Burnetta Sabin, MSN, APRN, ANVP-BC, AGPCNP-BC Advanced Practice Stroke Nurse [place] for Schedule & Pager information 05/11/2018 5:21 PM  I have personally examined this patient, reviewed notes, independently viewed imaging studies, participated in medical decision making and plan of care.ROS completed by me personally and pertinent positives fully documented  I have made any additions or clarifications directly to the above note. Agree with note above.she presented with disorientation and memory difficulties along with some speech difficulties due to left PCA patchy infarct etiology to be determined. She also had incidental lipoma intracranially. Recommend ongoing stroke workup and antiplatelet therapy.patient was not to drive till her visual fields improve. Check TEE and loop recorder for paroxysmal A. fib Greater than 50% time during this 35 minute visit was spent on counseling and coordination of care about his stroke and discussion about plan of care and answering questions.  Antony Contras, MD Medical Director [facility] Stroke [facility] Pager: [phone] 05/11/2018 6:20 PM  To contact Stroke Continuity provider, please refer to [email or web address] After hours,  contact General Neurology

## 2018-05-11 NOTE — Consult Note (Signed)
Referring Physician: Dr. Alcario Drought    Chief Complaint: Confusion  HPI: Sharon [name] is an 72 y.o. female with morbid obesity, DM2, diastolic CHF, HTN and prior seizure due to [facility] who presented initially to [facility] with new onset confusion, speech deficit and LLE weakness. Symptoms began on Monday after work (she works at [employer]) at about 4:30 PM. When attempting to drive home, she got lost, requiring her to call a friend for help. Her friend then brought her to the ED at [facility]. A Teleneurology consult was initiated. She had an NIHSS of 1. CTA head and neck showed no LVO but with some regions of moderate to severe atherosclerotic narrowing were noted in the posterior circulation. She also had difficulty with speech, but this symptom has since resolved. She has had some LLE weakness which still had not resolved at the time of transfer from [facility] to [facility].   At the time of Neurological evaluation, she denies headache, vision changes, current speech deficit, CP, limb numbness or limb weakness.   LSN: [id number] on Monday tPA Given: No: NIHSS at OSH was 1.   Past Medical History:  Diagnosis Date  . Anxiety   . Asthma   . CHF (congestive heart failure) ([place])   . Depression   . Diabetes mellitus   . GERD (gastroesophageal reflux disease)   . Heart murmur    echo- 04/2016- mild aortic stenosis  . History of kidney stones    passed  . Hypercholesteremia   . Hypertension   . Kidney stones    "last kidney numbers normal"  . Left ear hearing loss   . Osteoarthritis   . Seizures ([place]) 04/2016   due to Hyperosmolar Nonketotic hyperglycemia- is what it was thought to be cause by. No further seizure activity.  . Thrombophlebitis     Past Surgical History:  Procedure Laterality Date  . ABDOMINAL HYSTERECTOMY    . APPENDECTOMY    . BREAST BIOPSY Right    many yrs ago per pt; neg bx  . CARPAL TUNNEL RELEASE Bilateral   . CHOLECYSTECTOMY    . COLONOSCOPY    . COLONOSCOPY WITH PROPOFOL N/A 11/03/2017    Procedure: COLONOSCOPY WITH PROPOFOL;  Surgeon: Clarene Essex, MD;  Location: [place];  Service: Endoscopy;  Laterality: N/A;  . COLONOSCOPY WITH PROPOFOL N/A 11/04/2017   Procedure: COLONOSCOPY WITH PROPOFOL;  Surgeon: Clarene Essex, MD;  Location: [place];  Service: Endoscopy;  Laterality: N/A;  . ESOPHAGOGASTRODUODENOSCOPY (EGD) WITH PROPOFOL N/A 11/03/2017   Procedure: ESOPHAGOGASTRODUODENOSCOPY (EGD) WITH PROPOFOL;  Surgeon: Clarene Essex, MD;  Location: [place];  Service: Endoscopy;  Laterality: N/A;  . ESOPHAGOSCOPY    . EYE SURGERY Bilateral    Cataract with lens  . INCISION AND DRAINAGE PERIRECTAL ABSCESS    . pyelogram      Family History  Problem Relation Age of Onset  . Heart attack Mother        8 HEART ATTACKS  . Stroke Mother        3 STROKES  . Heart attack Father   . Stroke Father    Social History:  reports that she has never smoked. She has never used smokeless tobacco. She reports that she drank alcohol. She reports that she does not use drugs.  Allergies:  Allergies  Allergen Reactions  . Crestor [Rosuvastatin] Other (See Comments)    Myalgias  . Indomethacin Other (See Comments)    dizziness  . Invanz [Ertapenem Sodium] Hives  . Lipitor [Atorvastatin Calcium]  Other (See Comments)    Myalgias   . Reglan [Metoclopramide] Other (See Comments)    TREMORS    Medications:  Prior to Admission:  Medications Prior to Admission  Medication Sig Dispense Refill Last Dose  . beta carotene w/minerals (OCUVITE) tablet Take 1 tablet by mouth daily.   Taking  . Calcium Carbonate-Vitamin D (CALTRATE 600+D) 600-400 MG-UNIT per tablet Take 2 tablets by mouth daily.    Taking  . Carboxymethylcellul-Glycerin (LUBRICATING EYE DROPS OP) Place 1-2 drops into both eyes 4 (four) times daily as needed (for dry eyes).   Taking  . dexlansoprazole (DEXILANT) 60 MG capsule Take 60 mg by mouth daily.     Taking  . dextromethorphan-guaiFENesin (MUCINEX DM) 30-600 MG 12hr  tablet Take 1 tablet by mouth 2 (two) times daily as needed for cough.   Taking  . ferrous sulfate 325 (65 FE) MG tablet Take 325 mg by mouth daily with breakfast.   Taking  . furosemide (LASIX) 40 MG tablet Take 40 mg by mouth 2 (two) times daily.   Taking  . ibuprofen (ADVIL,MOTRIN) 200 MG tablet Take 800-1,200 mg by mouth every 6 (six) hours as needed for headache or moderate pain.    Taking  . insulin aspart (NOVOLOG FLEXPEN) 100 UNIT/ML FlexPen Inject 10-25 Units into the skin 3 (three) times daily with meals. Per sliding scale   Taking  . Lidocaine-Hydrocortisone Ace 3-0.5 % CREA Apply 1 application topically daily as needed (for itching).   Taking  . liraglutide (VICTOZA) 18 MG/3ML SOPN Inject 1.8 mg into the skin daily.   Taking  . lisinopril (PRINIVIL,ZESTRIL) 10 MG tablet Take 10 mg by mouth daily.   Taking  . meloxicam (MOBIC) 15 MG tablet Take 15 mg by mouth daily.   Taking  . metoprolol succinate (TOPROL-XL) 25 MG 24 hr tablet Take 1 tablet (25 mg total) by mouth daily. 90 tablet 3   . ondansetron (ZOFRAN) 4 MG tablet Take 4 mg by mouth every 8 (eight) hours as needed for nausea or vomiting.   0 Taking  . PARoxetine (PAXIL) 40 MG tablet Take 80 mg by mouth every morning.    Taking  . polyethylene glycol (MIRALAX / GLYCOLAX) packet Take 17 g by mouth daily as needed for mild constipation.   Taking  . Probiotic Product (ALIGN) 4 MG CAPS Take 8 mg by mouth daily.   Taking  . TRESIBA FLEXTOUCH 200 UNIT/ML SOPN Inject 80 Units into the skin daily. Will need further dose adjustment at Follow up 5 pen 2 Taking   Scheduled: . aspirin  300 mg Rectal Daily   Or  . aspirin  325 mg Oral Daily  . enoxaparin (LOVENOX) injection  40 mg Subcutaneous Daily  . insulin aspart  0-9 Units Subcutaneous Q4H   Continuous: . sodium chloride 100 mL/hr (05/10/18 2017)    ROS: As per HPI.   Physical Examination: Blood pressure 136/65, pulse 66, temperature 98.6 F (37 C), temperature source Oral,  resp. rate 18, height '4\' 11"'  (1.499 m), weight 119.2 kg, SpO2 97 %.  General: Morbid obesity. NAD.  HEENT: [place]/AT Lungs: Respirations unlabored Ext: No edema  Neurologic Examination: Mental Status: Alert, oriented, thought content appropriate.  Speech fluent without evidence of aphasia.  Able to follow all commands without difficulty. Cranial Nerves: II:  Visual fields intact to confrontation individually, with right hemifield extinction on testing of DSS. PERRL.  III,IV, VI: No ptosis. EOMI without nystagmus.  V,VII: No facial droop. Temp sensation  equal.  VIII: hearing intact to voice IX,X: uvula midline XI: symmetric XII: midline tongue extension  Motor: RUE and RLE 5/5 proximal and distal.  LUE 5/5 LLE: 4/5  Normal tone throughout; no atrophy noted Sensory: Temp and FT intact x 3 Deep Tendon Reflexes:  1+ bilateral upper and lower extremities Patellae and achilles 0 bilaterally Cerebellar: Mild dysmetria RUE worse than LUE with FNF Gait: Deferred   Results for orders placed or performed during the hospital encounter of 05/10/18 (from the past 48 hour(s))  CBG monitoring, ED     Status: Abnormal   Collection Time: 05/10/18  6:07 PM  Result Value Ref Range   Glucose-Capillary 222 (H) 70 - 99 mg/dL  Troponin I     Status: None   Collection Time: 05/10/18  6:15 PM  Result Value Ref Range   Troponin I <0.03 <0.03 ng/mL    Comment: Performed at [facility], [address] [place], [place] [address]  Urine rapid drug screen (hosp performed)     Status: Abnormal   Collection Time: 05/10/18  6:15 PM  Result Value Ref Range   Opiates POSITIVE (A) NONE DETECTED   Cocaine NONE DETECTED NONE DETECTED   Benzodiazepines POSITIVE (A) NONE DETECTED   Amphetamines NONE DETECTED NONE DETECTED   Tetrahydrocannabinol NONE DETECTED NONE DETECTED   Barbiturates NONE DETECTED NONE DETECTED    Comment: (NOTE) DRUG SCREEN FOR MEDICAL PURPOSES ONLY.  IF CONFIRMATION IS  NEEDED FOR ANY PURPOSE, NOTIFY LAB WITHIN 5 DAYS. LOWEST DETECTABLE LIMITS FOR URINE DRUG SCREEN Drug Class                     Cutoff (ng/mL) Amphetamine and metabolites    1000 Barbiturate and metabolites    200 Benzodiazepine                 254 Tricyclics and metabolites     300 Opiates and metabolites        300 Cocaine and metabolites        300 THC                            50 Performed at [facility], [address] [place], [place] [address]   Urinalysis, Routine w reflex microscopic     Status: None   Collection Time: 05/10/18  6:15 PM  Result Value Ref Range   Color, Urine YELLOW YELLOW   APPearance CLEAR CLEAR   Specific Gravity, Urine 1.015 1.005 - 1.030   pH 7.0 5.0 - 8.0   Glucose, UA NEGATIVE NEGATIVE mg/dL   Hgb urine dipstick NEGATIVE NEGATIVE   Bilirubin Urine NEGATIVE NEGATIVE   Ketones, ur NEGATIVE NEGATIVE mg/dL   Protein, ur NEGATIVE NEGATIVE mg/dL   Nitrite NEGATIVE NEGATIVE   Leukocytes, UA NEGATIVE NEGATIVE    Comment: Microscopic not done on urines with negative protein, blood, leukocytes, nitrite, or glucose < 500 mg/dL. Performed at [facility], [address] [place], [place] [address]   CBC with Differential     Status: Abnormal   Collection Time: 05/10/18  7:08 PM  Result Value Ref Range   WBC 13.9 (H) 4.0 - 10.5 K/uL   RBC 3.60 (L) 3.87 - 5.11 MIL/uL   Hemoglobin 10.6 (L) 12.0 - 15.0 g/dL   HCT 32.9 (L) 36.0 - 46.0 %   MCV 91.4 80.0 - 100.0 fL   MCH  29.4 26.0 - 34.0 pg   MCHC 32.2 30.0 - 36.0 g/dL   RDW 12.7 11.5 - 15.5 %   Platelets 288 150 - 400 K/uL   nRBC 0.0 0.0 - 0.2 %   Neutrophils Relative % 78 %   Neutro Abs 10.7 (H) 1.7 - 7.7 K/uL   Lymphocytes Relative 15 %   Lymphs Abs 2.0 0.7 - 4.0 K/uL   Monocytes Relative 6 %   Monocytes Absolute 0.9 0.1 - 1.0 K/uL   Eosinophils Relative 1 %   Eosinophils Absolute 0.2 0.0 - 0.5 K/uL   Basophils Relative 0 %   Basophils Absolute 0.0 0.0 - 0.1 K/uL    Immature Granulocytes 0 %   Abs Immature Granulocytes 0.06 0.00 - 0.07 K/uL    Comment: Performed at [facility], [address] [place], [place] [address]  Comprehensive metabolic panel     Status: Abnormal   Collection Time: 05/10/18  7:08 PM  Result Value Ref Range   Sodium 136 135 - 145 mmol/L   Potassium 3.6 3.5 - 5.1 mmol/L   Chloride 103 98 - 111 mmol/L   CO2 24 22 - 32 mmol/L   Glucose, Bld 186 (H) 70 - 99 mg/dL   BUN 22 8 - 23 mg/dL   Creatinine, Ser 1.35 (H) 0.44 - 1.00 mg/dL   Calcium 9.1 8.9 - 10.3 mg/dL   Total Protein 6.6 6.5 - 8.1 g/dL   Albumin 3.4 (L) 3.5 - 5.0 g/dL   AST 16 15 - 41 U/L   ALT 13 0 - 44 U/L   Alkaline Phosphatase 89 38 - 126 U/L   Total Bilirubin 0.5 0.3 - 1.2 mg/dL   GFR calc non Af Amer 39 (L) >60 mL/min   GFR calc Af Amer 45 (L) >60 mL/min    Comment: (NOTE) The eGFR has been calculated using the CKD EPI equation. This calculation has not been validated in all clinical situations. eGFR's persistently <60 mL/min signify possible Chronic Kidney Disease.    Anion gap 9 5 - 15    Comment: Performed at [facility], [address] [place], [place] [address]  Protime-INR     Status: None   Collection Time: 05/10/18  7:08 PM  Result Value Ref Range   Prothrombin Time 14.1 11.4 - 15.2 seconds   INR 1.10     Comment: Performed at [facility], [address] [place], [place] [address]  APTT     Status: None   Collection Time: 05/10/18  7:08 PM  Result Value Ref Range   aPTT 26 24 - 36 seconds    Comment: Performed at [facility], [address] [place], [place] [address]  Ethanol     Status: None   Collection Time: 05/10/18  7:08 PM  Result Value Ref Range   Alcohol, Ethyl (B) <10 <10 mg/dL    Comment: (NOTE) Lowest detectable limit for serum alcohol is 10 mg/dL. For medical purposes only. Performed at [facility], [address] [place], [place] [address]   Glucose, capillary      Status: Abnormal   Collection Time: 05/11/18  5:36 AM  Result Value Ref Range   Glucose-Capillary 130 (H) 70 - 99 mg/dL   Comment 1 Notify RN    Comment 2 Document in Chart   Hemoglobin A1c     Status: Abnormal   Collection Time: 05/11/18  6:14 AM  Result Value Ref Range   Hgb A1c MFr Bld 8.7 (H) 4.8 - 5.6 %    Comment: (NOTE) Pre diabetes:          5.7%-6.4% Diabetes:              >6.4% Glycemic control for   <7.0% adults with diabetes    Mean Plasma Glucose 202.99 mg/dL    Comment: Performed at [facility] [address] [place], [place] [address]   Ct Angio Head W Or Wo Contrast  Result Date: 05/10/2018 CLINICAL DATA:  Disoriented, confusion leaving work today. History of seizures, hypertension, hypercholesterolemia and diabetes. EXAM: CT ANGIOGRAPHY HEAD AND NECK TECHNIQUE: Multidetector CT imaging of the head and neck was performed using the standard protocol during bolus administration of intravenous contrast. Multiplanar CT image reconstructions and MIPs were obtained to evaluate the vascular anatomy. Carotid stenosis measurements (when applicable) are obtained utilizing NASCET criteria, using the distal internal carotid diameter as the denominator. CONTRAST:  37m ISOVUE-370 IOPAMIDOL (ISOVUE-370) INJECTION 76% COMPARISON:  CT HEAD May 10, 2018. FINDINGS: CTA NECK FINDINGS: AORTIC ARCH: Normal appearance of the thoracic arch, normal branch pattern. Mild calcific atherosclerosis aortic arch. The origins of the innominate, left Common carotid artery and subclavian artery are widely patent. RIGHT CAROTID SYSTEM: Common carotid artery is patent. Mild calcific atherosclerosis of the carotid bifurcation without hemodynamically significant stenosis by NASCET criteria. Normal appearance of the internal carotid artery. LEFT CAROTID SYSTEM: Common carotid artery is patent. Mild calcific atherosclerosis of the carotid bifurcation without hemodynamically significant stenosis by  NASCET criteria. Normal appearance of the internal carotid artery. VERTEBRAL ARTERIES:Left vertebral artery is dominant. Normal appearance of the vertebral arteries, widely patent. SKELETON: No acute osseous process though bone windows have not been submitted. Moderate degenerative change of the cervical spine. OTHER NECK: Soft tissues of the neck are nonacute though, not tailored for evaluation. UPPER CHEST: Included lung apices are clear. No superior mediastinal lymphadenopathy. CTA HEAD FINDINGS: ANTERIOR CIRCULATION: Patent cervical internal carotid arteries, petrous, cavernous and supra clinoid internal carotid arteries. Patent anterior communicating artery. Patent anterior and middle cerebral arteries. No large vessel occlusion, significant stenosis, contrast extravasation or aneurysm. POSTERIOR CIRCULATION: Patent vertebral arteries, vertebrobasilar junction and basilar artery, as well as main branch vessels. Patent posterior cerebral arteries. Bilateral posterior communicating arteries present, RIGHT PCOM infundibulum. Fetal origin RIGHT PCA. Severe stenosis RIGHT P2 segment. Moderate luminal irregularity posterior cerebral arteries. No large vessel occlusion, contrast extravasation or aneurysm. VENOUS SINUSES: Major dural venous sinuses are patent though not tailored for evaluation on this angiographic examination. ANATOMIC VARIANTS: None. DELAYED PHASE: No abnormal intracranial enhancement. MIP images reviewed. IMPRESSION: CTA NECK: 1. No hemodynamically significant stenosis ICA's. Patent vertebral arteries. CTA HEAD: 1. No emergent large vessel occlusion. 2. Atherosclerosis disproportionately affecting posterior cerebral arteries; severe stenosis RIGHT P2 segment. Acute findings discussed with and reconfirmed by Dr.JULIE HAVILAND on 05/10/2018 at 7:04 pm. Aortic Atherosclerosis (ICD10-I70.0). Electronically Signed   By: CElon AlasM.D.   On: 05/10/2018 19:05   Ct Angio Neck W And/or Wo  Contrast  Result Date: 05/10/2018 CLINICAL DATA:  Disoriented, confusion leaving work today. History of seizures, hypertension, hypercholesterolemia and diabetes. EXAM: CT ANGIOGRAPHY HEAD AND NECK TECHNIQUE: Multidetector CT imaging of the head and neck was performed using the standard protocol during bolus administration of intravenous contrast. Multiplanar CT image reconstructions and MIPs were obtained to evaluate the vascular anatomy. Carotid stenosis measurements (when applicable) are obtained utilizing NASCET criteria, using the distal internal  carotid diameter as the denominator. CONTRAST:  52m ISOVUE-370 IOPAMIDOL (ISOVUE-370) INJECTION 76% COMPARISON:  CT HEAD May 10, 2018. FINDINGS: CTA NECK FINDINGS: AORTIC ARCH: Normal appearance of the thoracic arch, normal branch pattern. Mild calcific atherosclerosis aortic arch. The origins of the innominate, left Common carotid artery and subclavian artery are widely patent. RIGHT CAROTID SYSTEM: Common carotid artery is patent. Mild calcific atherosclerosis of the carotid bifurcation without hemodynamically significant stenosis by NASCET criteria. Normal appearance of the internal carotid artery. LEFT CAROTID SYSTEM: Common carotid artery is patent. Mild calcific atherosclerosis of the carotid bifurcation without hemodynamically significant stenosis by NASCET criteria. Normal appearance of the internal carotid artery. VERTEBRAL ARTERIES:Left vertebral artery is dominant. Normal appearance of the vertebral arteries, widely patent. SKELETON: No acute osseous process though bone windows have not been submitted. Moderate degenerative change of the cervical spine. OTHER NECK: Soft tissues of the neck are nonacute though, not tailored for evaluation. UPPER CHEST: Included lung apices are clear. No superior mediastinal lymphadenopathy. CTA HEAD FINDINGS: ANTERIOR CIRCULATION: Patent cervical internal carotid arteries, petrous, cavernous and supra clinoid internal  carotid arteries. Patent anterior communicating artery. Patent anterior and middle cerebral arteries. No large vessel occlusion, significant stenosis, contrast extravasation or aneurysm. POSTERIOR CIRCULATION: Patent vertebral arteries, vertebrobasilar junction and basilar artery, as well as main branch vessels. Patent posterior cerebral arteries. Bilateral posterior communicating arteries present, RIGHT PCOM infundibulum. Fetal origin RIGHT PCA. Severe stenosis RIGHT P2 segment. Moderate luminal irregularity posterior cerebral arteries. No large vessel occlusion, contrast extravasation or aneurysm. VENOUS SINUSES: Major dural venous sinuses are patent though not tailored for evaluation on this angiographic examination. ANATOMIC VARIANTS: None. DELAYED PHASE: No abnormal intracranial enhancement. MIP images reviewed. IMPRESSION: CTA NECK: 1. No hemodynamically significant stenosis ICA's. Patent vertebral arteries. CTA HEAD: 1. No emergent large vessel occlusion. 2. Atherosclerosis disproportionately affecting posterior cerebral arteries; severe stenosis RIGHT P2 segment. Acute findings discussed with and reconfirmed by Dr.JULIE HAVILAND on 05/10/2018 at 7:04 pm. Aortic Atherosclerosis (ICD10-I70.0). Electronically Signed   By: CElon AlasM.D.   On: 05/10/2018 19:05   Mr Brain Wo Contrast  Result Date: 05/11/2018 CLINICAL DATA:  72y/o F; disorientation, confusion, possible TIA, initial exam. EXAM: MRI HEAD WITHOUT CONTRAST MRA HEAD WITHOUT CONTRAST TECHNIQUE: Multiplanar, multiecho pulse sequences of the brain and surrounding structures were obtained without intravenous contrast. Angiographic images of the head were obtained using MRA technique without contrast. COMPARISON:  05/10/2018 CT head and CTA head.  04/17/2016 MRI head. FINDINGS: MRI HEAD FINDINGS Brain: Small foci of reduced diffusion are present within the left splenium of corpus callosum and left posterior hippocampus as well as a small focus  within the left occipital lobe (series 5, image [id number]). No associated hemorrhage or mass effect. Scattered nonspecific T2 FLAIR hyperintensities in subcortical and periventricular white matter as well as the pons are compatible with mild chronic microvascular ischemic changes for age. Mild volume loss of the brain. No extra-axial collection, hydrocephalus, or herniation. Stable bilobed left paramedian inferior frontal region mass with fat signal measuring 23 x 16 mm (AP by ML series 17, image 11). Vascular: As below. Skull and upper cervical spine: Normal marrow signal. Sinuses/Orbits: Left mastoid opacification. Mild paranasal sinus mucosal thickening. Bilateral intra-ocular lens replacement. Other: None. MRA HEAD FINDINGS Internal carotid arteries: Patent. Mild non stenotic irregularity of carotid siphons compatible with atherosclerotic changes. Anterior cerebral arteries:  Patent. Middle cerebral arteries: Patent. Anterior communicating artery: Patent. Posterior communicating arteries: Fetal right PCA. Patent left posterior communicating artery. Posterior cerebral  arteries: Patent right. No flow related signal within the left P4 parieto-occipital branch. Mild left P2 stenosis. Moderate to severe right P2 stenosis. Basilar artery: Patent. Short segment of mild upper basilar stenosis. Vertebral arteries: Patent. Short segment of distal left vertebral artery stenosis. No evidence of high-grade stenosis or aneurysm. IMPRESSION: MRI head: 1. Small foci of reduced diffusion present within splenium, left posterior hippocampus, and left occipital lobe compatible with acute/early subacute infarction. No associated hemorrhage or mass effect. 2. Mild chronic microvascular ischemic changes and volume loss of the brain. 3. 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 MRA head: 1. No flow related signal within left P4 parieto-occipital branch which may represent occlusion or high-grade  stenosis. 2. Moderate to severe right P2 stenosis. 3. No additional vessel occlusion, high-grade stenosis, or aneurysm. These results will be called to the ordering clinician or representative by the Radiologist Assistant, and communication documented in the PACS or zVision Dashboard. Electronically Signed   By: Kristine Garbe M.D.   On: 05/11/2018 04:54   Dg Chest Portable 1 View  Result Date: 05/10/2018 CLINICAL DATA:  Headache and confusion since noon. EXAM: PORTABLE CHEST 1 VIEW COMPARISON:  06/15/2016 FINDINGS: Top-normal heart size with aortic atherosclerosis. Slightly low lung volumes without acute pulmonary consolidation nor overt pulmonary edema. No effusion or pneumothorax. Osteoarthritis of the AC joints bilaterally. IMPRESSION: Borderline cardiomegaly with aortic atherosclerosis. No active pulmonary disease. Electronically Signed   By: Ashley Royalty M.D.   On: 05/10/2018 18:54   Mr Jodene Nam Head Wo Contrast  Result Date: 05/11/2018 CLINICAL DATA:  72 y/o F; disorientation, confusion, possible TIA, initial exam. EXAM: MRI HEAD WITHOUT CONTRAST MRA HEAD WITHOUT CONTRAST TECHNIQUE: Multiplanar, multiecho pulse sequences of the brain and surrounding structures were obtained without intravenous contrast. Angiographic images of the head were obtained using MRA technique without contrast. COMPARISON:  05/10/2018 CT head and CTA head.  04/17/2016 MRI head. FINDINGS: MRI HEAD FINDINGS Brain: Small foci of reduced diffusion are present within the left splenium of corpus callosum and left posterior hippocampus as well as a small focus within the left occipital lobe (series 5, image [id number]). No associated hemorrhage or mass effect. Scattered nonspecific T2 FLAIR hyperintensities in subcortical and periventricular white matter as well as the pons are compatible with mild chronic microvascular ischemic changes for age. Mild volume loss of the brain. No extra-axial collection, hydrocephalus, or herniation.  Stable bilobed left paramedian inferior frontal region mass with fat signal measuring 23 x 16 mm (AP by ML series 17, image 11). Vascular: As below. Skull and upper cervical spine: Normal marrow signal. Sinuses/Orbits: Left mastoid opacification. Mild paranasal sinus mucosal thickening. Bilateral intra-ocular lens replacement. Other: None. MRA HEAD FINDINGS Internal carotid arteries: Patent. Mild non stenotic irregularity of carotid siphons compatible with atherosclerotic changes. Anterior cerebral arteries:  Patent. Middle cerebral arteries: Patent. Anterior communicating artery: Patent. Posterior communicating arteries: Fetal right PCA. Patent left posterior communicating artery. Posterior cerebral arteries: Patent right. No flow related signal within the left P4 parieto-occipital branch. Mild left P2 stenosis. Moderate to severe right P2 stenosis. Basilar artery: Patent. Short segment of mild upper basilar stenosis. Vertebral arteries: Patent. Short segment of distal left vertebral artery stenosis. No evidence of high-grade stenosis or aneurysm. IMPRESSION: MRI head: 1. Small foci of reduced diffusion present within splenium, left posterior hippocampus, and left occipital lobe compatible with acute/early subacute infarction. No associated hemorrhage or mass effect. 2. Mild chronic microvascular ischemic changes and volume loss of the  brain. 3. 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 MRA head: 1. No flow related signal within left P4 parieto-occipital branch which may represent occlusion or high-grade stenosis. 2. Moderate to severe right P2 stenosis. 3. No additional vessel occlusion, high-grade stenosis, or aneurysm. These results will be called to the ordering clinician or representative by the Radiologist Assistant, and communication documented in the PACS or zVision Dashboard. Electronically Signed   By: Kristine Garbe M.D.   On: 05/11/2018 04:54   Ct Head  Code Stroke Wo Contrast  Result Date: 05/10/2018 CLINICAL DATA:  Code stroke. Confusion while leaving work this afternoon. History of seizures, hypertension, hypercholesterolemia and diabetes. EXAM: CT HEAD WITHOUT CONTRAST TECHNIQUE: Contiguous axial images were obtained from the base of the skull through the vertex without intravenous contrast. COMPARISON:  MRI head April 17, 2016 and CT HEAD April 15, 2016. FINDINGS: BRAIN: No intraparenchymal hemorrhage nor midline shift. The ventricles and sulci are normal for age. Patchy supratentorial white matter hypodensities less than expected for patient's age, though non-specific are most compatible with chronic small vessel ischemic disease. Lobulated LEFT inferior frontal lobe fat calcified mass is unchanged, no significant mass effect. No acute large vascular territory infarcts. No abnormal extra-axial fluid collections. Basal cisterns are patent. VASCULAR: Moderate calcific atherosclerosis of the carotid siphons. SKULL: No skull fracture. No significant scalp soft tissue swelling. SINUSES/ORBITS: Trace paranasal sinus mucosal thickening. LEFT mastoid effusion. Included ocular globes and orbital contents are non-suspicious. Status post bilateral ocular lens implants. OTHER: None. ASPECTS [facility] Stroke Program Early CT Score) - Ganglionic level infarction (caudate, lentiform nuclei, internal capsule, insula, M1-M3 cortex): 7 - Supraganglionic infarction (M4-M6 cortex): 3 Total score (0-10 with 10 being normal): 10 IMPRESSION: 1. No acute intracranial process. 2. ASPECTS is 10. 3. 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 4. Critical Value/emergent results were called by telephone at the time of interpretation on 05/10/2018 at 6:33 pm to Dr. Isla Pence , who verbally acknowledged these results. Electronically Signed   By: Elon Alas M.D.   On: 05/10/2018 18:34     Assessment: 72 y.o. female with acute left PCA territory ischemic infarction 1. Exam reveals mild LLE weakness, mild R > L upper extremity dysmetria and extinction within right visual field to double simultaneous stimulation. 2. CTA neck: No hemodynamically significant stenosis ICA's. Patent vertebral arteries.  3. CTA head: No emergent large vessel occlusion. Atherosclerosis disproportionately affecting posterior cerebral arteries; severe stenosis RIGHT P2 segment. 4. MRI brain reveals small foci of reduced diffusion present within splenium, left posterior hippocampus, and left occipital lobe compatible with acute/early subacute infarction in the left PCA territory. No associated hemorrhage or mass effect. Mild chronic microvascular ischemic changes and volume loss of the brain are also noted.  5. MRA head: No flow related signal within left P4 parieto-occipital branch which may represent occlusion or high-grade stenosis. There is moderate to severe right P2 stenosis.  6. 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  7. Stroke Risk Factors - DM, CHF, hypercholesterolemia, HTN, morbid obesity  Plan: 1. PT consult, OT consult, Speech consult 2. Echocardiogram 3. Prophylactic therapy- Agree with starting ASA 4. Has an allergy to rosuvastatin. A trial of atorvastatin should be considered but will need close monitoring for possible side effects/allergy. Obtain CK level and LFTs before starting.  5. Telemetry monitoring 6. Frequent neuro  checks 7. HgbA1c, fasting lipid panel 8. Risk factor modification 9. BP management  '@Electronically'  signed: Dr. Kerney Elbe  05/11/2018, 6:56 AM

## 2018-05-11 NOTE — Evaluation (Signed)
Occupational Therapy Evaluation Patient Details Name: Sharon [name] MRN: [id number] DOB: [birth year] Today's Date: 05/11/2018    History of Present Illness  Pt is a 72 y.o. female with morbid obesity, DM2, diastolic CHF, HTN and prior seizure due to [facility] who presented initially to [facility] with new onset confusion, speech deficit and LLE weakness. MRI confirms left PCA CVA   Clinical Impression   PTA patient independent and working.  Currently patient admitted for above and limited by R peripheral vision loss/R visual field, decreased STM, and dysmetric R UE.  Patient demonstrates ability to complete self care tasks and mobility with supervision for safety, intermittent cueing for visual scanning towards R side to compensate.  Short blessed test completed:  Scoring 8/10 (questionable impairment) with errors on orientation of year and immediate recall.  Patient will benefit from continued OT services while admitted in order to address visual and cognitive impairments, but anticipate no further needs after dc. Will continue to follow.     Follow Up Recommendations  No OT follow up    Equipment Recommendations  None recommended by OT    Recommendations for Other Services       Precautions / Restrictions Precautions Precautions: Fall Restrictions Weight Bearing Restrictions: No      Mobility Bed Mobility Overal bed mobility: Modified Independent             General bed mobility comments: pt OOB in recliner   Transfers Overall transfer level: Needs assistance Equipment used: None Transfers: Sit to/from Stand Sit to Stand: Modified independent (Device/Increase time)         General transfer comment: increased time, no assist required    Balance Overall balance assessment: Independent                                  ADL either performed or assessed with clinical judgement   ADL Overall ADL's : Needs assistance/impaired     Grooming:  Supervision/safety;Standing Grooming Details (indicate cue type and reason): supervision for cueing and visual scanning to find trashcan Upper Body Bathing: Set up;Sitting   Lower Body Bathing: Set up;Sit to/from stand   Upper Body Dressing : Set up;Sitting Upper Body Dressing Details (indicate cue type and reason): to don gown Lower Body Dressing: Set up;Sit to/from stand   Toilet Transfer: Ambulation;Modified Independent           Functional mobility during ADLs: Supervision/safety       Vision Baseline Vision/History: Wears glasses Wears Glasses: At all times Patient Visual Report: Peripheral vision impairment Vision Assessment?: Yes Eye Alignment: Within Functional Limits Ocular Range of Motion: Within Functional Limits Alignment/Gaze Preference: Within Defined Limits Tracking/Visual Pursuits: Able to track stimulus in all quads without difficulty Saccades: Within functional limits Convergence: Within functional limits Visual Fields: Right visual field deficit     Perception     Praxis      Pertinent Vitals/Pain Pain Assessment: No/denies pain     Hand Dominance Right   Extremity/Trunk Assessment Upper Extremity Assessment Upper Extremity Assessment: RUE deficits/detail;LUE deficits/detail RUE Deficits / Details: grossly 4/5 MMT, slight dysmetric GMC but functional   RUE Sensation: WNL RUE Coordination: WNL LUE Deficits / Details: grossly 4/5 MMT  LUE Sensation: WNL LUE Coordination: WNL   Lower Extremity Assessment Lower Extremity Assessment: Defer to PT evaluation   Cervical / Trunk Assessment Cervical / Trunk Assessment: Normal   Communication Communication Communication: No difficulties   Cognition  Arousal/Alertness: Awake/alert Behavior During Therapy: WFL for tasks assessed/performed Overall Cognitive Status: Impaired/Different from baseline Area of Impairment: Orientation;Memory                 Orientation Level: Disoriented  to;Time   Memory: Decreased short-term memory                Exercises     Shoulder Instructions      Home Living Family/patient expects to be discharged to:: Private residence Living Arrangements: Other relatives(sister in law ) Available Help at Discharge: Friend(s);Family;Available PRN/intermittently Type of Home: Apartment Home Access: Level entry     Home Layout: One level     Bathroom Shower/Tub: Teacher, early years/pre: Handicapped height Bathroom Accessibility: Yes   Home Equipment: Walker - 2 wheels;Grab bars - tub/shower;Grab bars - toilet;Cane - quad;Shower seat   Additional Comments: Pt states she was not using DME prior to admission      Prior Functioning/Environment Level of Independence: Independent        Comments: pt independent and working         OT Problem List: Impaired vision/perception;Decreased cognition      OT Treatment/Interventions: Therapeutic activities;Cognitive remediation/compensation;Visual/perceptual remediation/compensation;Patient/family education    OT Goals(Current goals can be found in the care plan section) Acute Rehab OT Goals Patient Stated Goal: to get back to work OT Goal Formulation: With patient Time For Goal Achievement: 05/18/18 Potential to Achieve Goals: Good  OT Frequency: Min 2X/week   Barriers to D/C:            Co-evaluation              AM-PAC PT "6 Clicks" Daily Activity     Outcome Measure Help from another person eating meals?: None Help from another person taking care of personal grooming?: None Help from another person toileting, which includes using toliet, bedpan, or urinal?: None Help from another person bathing (including washing, rinsing, drying)?: None Help from another person to put on and taking off regular upper body clothing?: None Help from another person to put on and taking off regular lower body clothing?: None 6 Click Score: 24   End of Session Nurse  Communication: Mobility status  Activity Tolerance: Patient tolerated treatment well Patient left: in chair;with call bell/phone within reach;with family/visitor present  OT Visit Diagnosis: Other (comment);Other symptoms and signs involving cognitive function(visual impairment )                Time: 8032-1224 OT Time Calculation (min): 21 min Charges:  OT General Charges $OT Visit: 1 Visit OT Evaluation $OT Eval Moderate Complexity: [place], OT Acute Rehabilitation Services Pager [phone] Office [phone]   Delight Stare 05/11/2018, 1:42 PM

## 2018-05-11 NOTE — Progress Notes (Signed)
  Echocardiogram 2D Echocardiogram has been performed.  Sharon [name] 05/11/2018, 9:07 AM

## 2018-05-11 NOTE — Progress Notes (Signed)
Inpatient Diabetes Program Recommendations  AACE/ADA: New Consensus Statement on Inpatient Glycemic Control (2015)  Target Ranges:  Prepandial:   less than 140 mg/dL      Peak postprandial:   less than 180 mg/dL (1-2 hours)      Critically ill patients:  140 - 180 mg/dL   Results for Sharon [name], Sharon [name] (MRN [id number]) as of 05/11/2018 12:40  Ref. Range 05/10/2018 18:07 05/10/2018 22:24 05/11/2018 05:36 05/11/2018 12:23  Glucose-Capillary Latest Ref Range: 70 - 99 mg/dL 222 (H) 101 (H) 130 (H) Novolog 1 units given 212 (H)   Review of Glycemic Control  Diabetes history: DM 2 Outpatient Diabetes medications: Tresiba 80 units Daily, Novolog 10-30 units tid, Victoza 1.8 mg Daily Current orders for Inpatient glycemic control: Novolog 0-9 units Q4 hours  A1c 8.7% this admission, however Hgb is 9.8, A1c not entirely accurate.  Inpatient Diabetes Program Recommendations:    Based on glucose trends and renal function, consider increasing Novolog Correction scale to 0-15 units tid and Novolog 0-5 units qhs.  Patient takes long acting insulin at home in addition to meal coverage. Will watch trends for now. Noted NPO for TEE in the am.   Thanks,  Tama Headings RN, MSN, BC-ADM Inpatient Diabetes Coordinator Team Pager [phone] (8a-5p)

## 2018-05-11 NOTE — Progress Notes (Signed)
SLP Cancellation Note  Patient Details Name: Sharon [name] MRN: [id number] DOB: [birth year]   Cancelled treatment:       Reason Eval/Treat Not Completed: Patient at procedure or test/unavailable. Will continue attempts later today   Aveion Nguyen, Katherene Ponto 05/11/2018, 8:41 AM

## 2018-05-11 NOTE — Progress Notes (Signed)
Going for MRI.

## 2018-05-11 NOTE — Progress Notes (Signed)
Called by radiology, MRI confirms acute to early subacute stroke.  Will call neurology to let them know.
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
## 2018-05-11 NOTE — Progress Notes (Signed)
Physical Therapy Evaluation Patient Details Name: Sharon [name] MRN: [id number] DOB: 1946-07-08 Today's Date: 05/11/2018   History of Present Illness   Pt is a 72 y.o. female with morbid obesity, DM2, diastolic CHF, HTN and prior seizure due to [facility] who presented initially to [facility] with new onset confusion, speech deficit and LLE weakness. MRI confirms left PCA CVA  Clinical Impression  Pt tolerated and agreeable to evaluation. Pt required no physical assist for transfers, mobility or ambulation. At this time, anticipate pt is appropriate for d/c home with family and friend support. No follow up PT recommended.     Follow Up Recommendations No PT follow up    Equipment Recommendations  None recommended by PT    Recommendations for Other Services       Precautions / Restrictions Precautions Precautions: Fall Restrictions Weight Bearing Restrictions: No      Mobility  Bed Mobility Overal bed mobility: Modified Independent             General bed mobility comments: pt OOB in recliner   Transfers Overall transfer level: Needs assistance Equipment used: None Transfers: Sit to/from Stand Sit to Stand: Modified independent (Device/Increase time)         General transfer comment: increased time, no assist required  Ambulation/Gait Ambulation/Gait assistance: Supervision Gait Distance (Feet): 150 Feet Assistive device: None Gait Pattern/deviations: Step-through pattern;Decreased stride length;Wide base of support Gait velocity: decreased Gait velocity interpretation: 1.31 - 2.62 ft/sec, indicative of limited community ambulator General Gait Details: Pt required no physical assist or cueing for ambulation. Supervision for increased gait distance.  Stairs            Wheelchair Mobility    Modified Rankin (Stroke Patients Only)       Balance Overall balance assessment: Independent                               Standardized Balance  Assessment Standardized Balance Assessment : Dynamic Gait Index   Dynamic Gait Index Level Surface: Mild Impairment Change in Gait Speed: Normal Gait with Horizontal Head Turns: Mild Impairment Gait with Vertical Head Turns: Moderate Impairment(slight loss of balance with looking up; able to self correct) Gait and Pivot Turn: Mild Impairment Step Over Obstacle: Mild Impairment Step Around Obstacles: Mild Impairment Steps: (did not assess)       Pertinent Vitals/Pain Pain Assessment: No/denies pain    Home Living Family/patient expects to be discharged to:: Private residence Living Arrangements: Other relatives(sister in law ) Available Help at Discharge: Friend(s);Family;Available PRN/intermittently Type of Home: Apartment Home Access: Level entry     Home Layout: One level Home Equipment: Walker - 2 wheels;Grab bars - tub/shower;Grab bars - toilet;Cane - quad;Shower seat Additional Comments: Pt states she was not using DME prior to admission    Prior Function Level of Independence: Independent         Comments: pt independent and working      Hand Dominance   Dominant Hand: Right    Extremity/Trunk Assessment   Upper Extremity Assessment Upper Extremity Assessment: RUE deficits/detail;LUE deficits/detail RUE Deficits / Details: grossly 4/5 MMT, slight dysmetric GMC but functional   RUE Sensation: WNL RUE Coordination: WNL LUE Deficits / Details: grossly 4/5 MMT  LUE Sensation: WNL LUE Coordination: WNL    Lower Extremity Assessment Lower Extremity Assessment: Defer to PT evaluation    Cervical / Trunk Assessment Cervical / Trunk Assessment: Normal  Communication   Communication:  No difficulties  Cognition Arousal/Alertness: Awake/alert Behavior During Therapy: WFL for tasks assessed/performed Overall Cognitive Status: Impaired/Different from baseline Area of Impairment: Orientation;Memory                 Orientation Level: Disoriented  to;Time   Memory: Decreased short-term memory                General Comments General comments (skin integrity, edema, etc.): Pt states she is doing much better than yesterday. Upon standing pt requested to use the bathroom. Able to use the bathroom unassisted and then ambulated to the sink to wash her hands    Exercises     Assessment/Plan    PT Assessment Patent does not need any further PT services  PT Problem List         PT Treatment Interventions      PT Goals (Current goals can be found in the Care Plan section)  Acute Rehab PT Goals Patient Stated Goal: to get back to work PT Goal Formulation: With patient Potential to Achieve Goals: Good    Frequency     Barriers to discharge        Co-evaluation               AM-PAC PT "6 Clicks" Daily Activity  Outcome Measure Difficulty turning over in bed (including adjusting bedclothes, sheets and blankets)?: None Difficulty moving from lying on back to sitting on the side of the bed? : None Difficulty sitting down on and standing up from a chair with arms (e.g., wheelchair, bedside commode, etc,.)?: None Help needed moving to and from a bed to chair (including a wheelchair)?: None Help needed walking in hospital room?: A Little Help needed climbing 3-5 steps with a railing? : A Little 6 Click Score: 22    End of Session Equipment Utilized During Treatment: Gait belt Activity Tolerance: Patient tolerated treatment well Patient left: in chair;with call bell/phone within reach(RN in room) Nurse Communication: Mobility status(toileting during treatment) PT Visit Diagnosis: Other abnormalities of gait and mobility (R26.89);Other symptoms and signs involving the nervous system (R29.898)    Time: 8099-8338 PT Time Calculation (min) (ACUTE ONLY): 16 min   Charges:   PT Evaluation $PT Eval Low Complexity: 1 Low          Selinda Michaels, SPT  Selinda Michaels 05/11/2018, 1:45 PM

## 2018-05-11 NOTE — Progress Notes (Signed)
    [facility] has been requested to perform a transesophageal echocardiogram on 05/11/2018 for CVA.  After careful review of history and examination, the risks and benefits of transesophageal echocardiogram have been explained including risks of esophageal damage, perforation (1:10,000 risk), bleeding, pharyngeal hematoma as well as other potential complications associated with conscious sedation including aspiration, arrhythmia, respiratory failure and death. Alternatives to treatment were discussed, questions were answered. Patient is willing to proceed.   72 yo female presented with HTN, HLD, DM II and GERD presented with left spleenium infarcts embolic secondary to unknown source. Mild disease in the carotid. Vitals stable, mildly anemic, platelet normal.   Almyra Deforest, PA-C 05/11/2018 8:47 PM

## 2018-05-12 ENCOUNTER — Encounter (HOSPITAL_COMMUNITY)
Admission: EM | Disposition: A | Discharge status: Home / Self Care | Payer: Self-pay | Source: Home / Self Care | Attending: Internal Medicine

## 2018-05-12 ENCOUNTER — Inpatient Hospital Stay (HOSPITAL_COMMUNITY): Payer: Medicare Other

## 2018-05-12 ENCOUNTER — Encounter (HOSPITAL_COMMUNITY): Payer: Self-pay | Admitting: Anesthesiology

## 2018-05-12 ENCOUNTER — Inpatient Hospital Stay (HOSPITAL_COMMUNITY): Payer: Medicare Other | Admitting: Anesthesiology

## 2018-05-12 DIAGNOSIS — I6389 Other cerebral infarction: Secondary | ICD-10-CM

## 2018-05-12 DIAGNOSIS — Z794 Long term (current) use of insulin: Secondary | ICD-10-CM

## 2018-05-12 DIAGNOSIS — I6330 Cerebral infarction due to thrombosis of unspecified cerebral artery: Secondary | ICD-10-CM

## 2018-05-12 DIAGNOSIS — I10 Essential (primary) hypertension: Secondary | ICD-10-CM

## 2018-05-12 DIAGNOSIS — E118 Type 2 diabetes mellitus with unspecified complications: Secondary | ICD-10-CM

## 2018-05-12 HISTORY — PX: LOOP RECORDER INSERTION: EP1214

## 2018-05-12 HISTORY — PX: TEE WITHOUT CARDIOVERSION: SHX5443

## 2018-05-12 LAB — GLUCOSE, CAPILLARY
GLUCOSE-CAPILLARY: 159 mg/dL — AB (ref 70–99)
GLUCOSE-CAPILLARY: 111 mg/dL — AB (ref 70–99)
GLUCOSE-CAPILLARY: 93 mg/dL (ref 70–99)
GLUCOSE-CAPILLARY: 104 mg/dL — AB (ref 70–99)

## 2018-05-12 SURGERY — ECHOCARDIOGRAM, TRANSESOPHAGEAL
Anesthesia: General

## 2018-05-12 SURGERY — LOOP RECORDER INSERTION

## 2018-05-12 MED ORDER — ONDANSETRON HCL 4 MG/2ML IJ SOLN
4.0000 mg | Freq: Once | INTRAMUSCULAR | Status: AC
  Administered 2018-05-12: 4 mg via INTRAVENOUS

## 2018-05-12 MED ORDER — LIDOCAINE-EPINEPHRINE 1 %-1:100000 IJ SOLN
INTRAMUSCULAR | Status: DC | PRN
  Administered 2018-05-12: 20 mL

## 2018-05-12 MED ORDER — ONDANSETRON HCL 4 MG PO TABS: 4.0000 mg | ORAL_TABLET | Freq: Three times a day (TID) | ORAL | 0 refills | Status: DC | PRN

## 2018-05-12 MED ORDER — LIDOCAINE-EPINEPHRINE 1 %-1:100000 IJ SOLN
INTRAMUSCULAR | Status: AC
  Filled 2018-05-12: qty 1

## 2018-05-12 MED ORDER — CLOPIDOGREL BISULFATE 75 MG PO TABS: 75.0000 mg | ORAL_TABLET | Freq: Every day | ORAL | 0 refills | Status: DC

## 2018-05-12 MED ORDER — BUTAMBEN-TETRACAINE-BENZOCAINE 2-2-14 % EX AERO
INHALATION_SPRAY | CUTANEOUS | Status: DC | PRN
  Administered 2018-05-12: 2 via TOPICAL

## 2018-05-12 MED ORDER — LIDOCAINE 2% (20 MG/ML) 5 ML SYRINGE
INTRAMUSCULAR | Status: DC | PRN
  Administered 2018-05-12: 75 mg via INTRAVENOUS

## 2018-05-12 MED ORDER — SODIUM CHLORIDE 0.9 % IV SOLN
INTRAVENOUS | Status: DC
  Administered 2018-05-12: 07:00:00 via INTRAVENOUS

## 2018-05-12 MED ORDER — ASPIRIN 81 MG PO TBEC: 81.0000 mg | DELAYED_RELEASE_TABLET | Freq: Every day | ORAL | 0 refills | Status: DC

## 2018-05-12 MED ORDER — PROPOFOL 500 MG/50ML IV EMUL
INTRAVENOUS | Status: DC | PRN
  Administered 2018-05-12: 75 ug/kg/min via INTRAVENOUS

## 2018-05-12 MED ORDER — ONDANSETRON HCL 4 MG/2ML IJ SOLN
INTRAMUSCULAR | Status: AC
  Filled 2018-05-12: qty 2

## 2018-05-12 SURGICAL SUPPLY — 2 items
LOOP REVEAL LINQSYS (Prosthesis & Implant Heart) ×3 IMPLANT
PACK LOOP INSERTION (CUSTOM PROCEDURE TRAY) ×3 IMPLANT

## 2018-05-12 NOTE — Progress Notes (Signed)
Pt discharged at this time.  Verbalizes all discharge instructions, including follow up appointments, and picking up prescriptions at her pharmacy.  She has all belongings with her, including her cell phone, upper and lower dentures, glasses, jewelry, and clothing.  Loop recorder dressing remains CDI. Verbalizes understanding of how to use Loop Recorder.  Denies any pain or complaints.  Transported home via car with family member.

## 2018-05-12 NOTE — Progress Notes (Signed)
Pt to cath lab for loop recorder.

## 2018-05-12 NOTE — Progress Notes (Signed)
SLP Cancellation Note  Patient Details Name: Sharon [name] MRN: [id number] DOB: [birth year]   Cancelled treatment:       Reason Eval/Treat Not Completed: Patient at procedure or test/unavailable   Floraine Buechler, Katherene Ponto 05/12/2018, 12:10 PM

## 2018-05-12 NOTE — Progress Notes (Signed)
Pt returned from having loop recorder placed at this time.  Dressing CDI. Pt states she has slight headache, but no other complaints.

## 2018-05-12 NOTE — H&P (View-Only) (Signed)
ELECTROPHYSIOLOGY CONSULT NOTE  Patient ID: Sharon [name] MRN: [id number], DOB/AGE: 11/26/1945   Admit date: 05/10/2018 Date of Consult: 05/12/2018  Primary Physician: [name], MD Primary Cardiologist: Dr. Marlou Porch Reason for Consultation: Cryptogenic stroke ; recommendations regarding Implantable Loop Recorder, requested by Dr. Leonie Man  History of Present Illness Sharon [name] was admitted on 05/10/2018 with stroke.  PMHx  Noted for DM, anxiety/depression, GERD, HTN, HLD, seizure (reported secondary to hyperglycemia), hx of palpitations 2015 noted with PACs, obesity.  She first developed symptoms while/after work with speech difficulties, confusion, LLE weakness.  Imaging demonstrated left spleenium infarcts embolic secondary to unknown source .  she has undergone workup for stroke including echocardiogram and carotid dopplers and angio.  The patient has been monitored on telemetry which has demonstrated sinus rhythm with no arrhythmias.  Inpatient stroke work-up is to be completed with a TEE.   Echocardiogram this admission demonstrated   Study Conclusions - Left ventricle: The cavity size was normal. There was mild focal   basal hypertrophy of the septum. Systolic function was normal.   The estimated ejection fraction was in the range of 60% to 65%.   Wall motion was normal; there were no regional wall motion   abnormalities. There was an increased relative contribution of   atrial contraction to ventricular filling. Doppler parameters are   consistent with abnormal left ventricular relaxation (grade 1   diastolic dysfunction). Doppler parameters are consistent with   high ventricular filling pressure. - Aortic valve: There was mild stenosis. Mean gradient (S): 15 mm   Hg. Valve area (VTI): 1.27 cm^2. Valve area (Vmax): 1.22 cm^2.   Valve area (Vmean): 1.38 cm^2. - Mitral valve: Calcified annulus. Mildly calcified leaflets . - Pulmonary arteries: Systolic pressure could not be  accurately   estimated. - Inferior vena cava: The vessel was mildly dilated.    Lab work is reviewed.  Prior to admission, the patient denies chest pain, shortness of breath, dizziness, or syncope.  She has hx of remote palpitations noted to be PACs and well controlled over the years with betablocker.  No ongoing/troublesome palpitations since on BB   She reports back to baseline from the stroke with some residual visual deficit only  with plans to home at discharge.   Past Medical History:  Diagnosis Date  . Anxiety   . Asthma   . CHF (congestive heart failure) ([place])   . Depression   . Diabetes mellitus   . GERD (gastroesophageal reflux disease)   . Heart murmur    echo- 04/2016- mild aortic stenosis  . History of kidney stones    passed  . Hypercholesteremia   . Hypertension   . Kidney stones    "last kidney numbers normal"  . Left ear hearing loss   . Osteoarthritis   . Seizures ([place]) 04/2016   due to Hyperosmolar Nonketotic hyperglycemia- is what it was thought to be cause by. No further seizure activity.  . Thrombophlebitis      Surgical History:  Past Surgical History:  Procedure Laterality Date  . ABDOMINAL HYSTERECTOMY    . APPENDECTOMY    . BREAST BIOPSY Right    many yrs ago per pt; neg bx  . CARPAL TUNNEL RELEASE Bilateral   . CHOLECYSTECTOMY    . COLONOSCOPY    . COLONOSCOPY WITH PROPOFOL N/A 11/03/2017   Procedure: COLONOSCOPY WITH PROPOFOL;  Surgeon: Clarene Essex, MD;  Location: [place];  Service: Endoscopy;  Laterality: N/A;  . COLONOSCOPY WITH PROPOFOL N/A  11/04/2017   Procedure: COLONOSCOPY WITH PROPOFOL;  Surgeon: Clarene Essex, MD;  Location: [place];  Service: Endoscopy;  Laterality: N/A;  . ESOPHAGOGASTRODUODENOSCOPY (EGD) WITH PROPOFOL N/A 11/03/2017   Procedure: ESOPHAGOGASTRODUODENOSCOPY (EGD) WITH PROPOFOL;  Surgeon: Clarene Essex, MD;  Location: [place];  Service: Endoscopy;  Laterality: N/A;  . ESOPHAGOSCOPY    . EYE SURGERY  Bilateral    Cataract with lens  . INCISION AND DRAINAGE PERIRECTAL ABSCESS    . pyelogram       Medications Prior to Admission  Medication Sig Dispense Refill Last Dose  . beta carotene w/minerals (OCUVITE) tablet Take 1 tablet by mouth daily.   05/10/2018 at Unknown time  . Calcium Carbonate-Vitamin D (CALTRATE 600+D) 600-400 MG-UNIT per tablet Take 2 tablets by mouth daily.    05/10/2018 at Unknown time  . Carboxymethylcellul-Glycerin (LUBRICATING EYE DROPS OP) Place 1-2 drops into both eyes 4 (four) times daily as needed (for dry eyes).   unk at prn  . dexlansoprazole (DEXILANT) 60 MG capsule Take 60 mg by mouth daily.     05/10/2018 at Unknown time  . dextromethorphan-guaiFENesin (MUCINEX DM) 30-600 MG 12hr tablet Take 1 tablet by mouth 2 (two) times daily as needed for cough.   unk at prn  . ferrous sulfate 325 (65 FE) MG tablet Take 325 mg by mouth daily with breakfast.   05/10/2018 at Unknown time  . furosemide (LASIX) 40 MG tablet Take 40 mg by mouth 2 (two) times daily.   05/10/2018 at Unknown time  . hydrochlorothiazide (HYDRODIURIL) 25 MG tablet Take 25 mg by mouth daily.   05/10/2018 at Unknown time  . ibuprofen (ADVIL,MOTRIN) 200 MG tablet Take 800-1,200 mg by mouth every 6 (six) hours as needed for headache or moderate pain.    unk at prn  . insulin aspart (NOVOLOG FLEXPEN) 100 UNIT/ML FlexPen Inject 10-30 Units into the skin 3 (three) times daily with meals. Per sliding scale    05/10/2018 at Unknown time  . Lidocaine-Hydrocortisone Ace 3-0.5 % CREA Apply 1 application topically daily as needed (for itching).   unk at prn  . liraglutide (VICTOZA) 18 MG/3ML SOPN Inject 1.8 mg into the skin daily.   05/10/2018 at Unknown time  . lisinopril (PRINIVIL,ZESTRIL) 10 MG tablet Take 10 mg by mouth daily.   05/10/2018 at Unknown time  . meloxicam (MOBIC) 15 MG tablet Take 15 mg by mouth daily.   05/10/2018 at Unknown time  . metoprolol succinate (TOPROL-XL) 25 MG 24 hr tablet Take 1  tablet (25 mg total) by mouth daily. 90 tablet 3 05/10/2018 at Unknown time  . ondansetron (ZOFRAN) 4 MG tablet Take 4 mg by mouth every 8 (eight) hours as needed for nausea or vomiting.   0 unk at prn  . PARoxetine (PAXIL) 40 MG tablet Take 80 mg by mouth every morning.    05/10/2018 at Unknown time  . polyethylene glycol (MIRALAX / GLYCOLAX) packet Take 17 g by mouth daily as needed for mild constipation.   unk at prn  . Probiotic Product (ALIGN) 4 MG CAPS Take 8 mg by mouth daily.   05/10/2018 at Unknown time  . simvastatin (ZOCOR) 40 MG tablet Take 40 mg by mouth every evening.  2 05/09/2018  . TRESIBA FLEXTOUCH 200 UNIT/ML SOPN Inject 80 Units into the skin daily. Will need further dose adjustment at Follow up 5 pen 2 05/10/2018 at Unknown time    Inpatient Medications:  . aspirin EC  81 mg Oral Daily  .  clopidogrel  75 mg Oral Daily  . enoxaparin (LOVENOX) injection  40 mg Subcutaneous Daily  . ferrous sulfate  325 mg Oral Q breakfast  . insulin aspart  0-9 Units Subcutaneous Q4H  . insulin glargine  80 Units Subcutaneous Daily  . metoprolol succinate  25 mg Oral Daily  . pantoprazole  40 mg Oral Daily  . PARoxetine  80 mg Oral q morning - 10a  . simvastatin  40 mg Oral QPM    Allergies:  Allergies  Allergen Reactions  . Crestor [Rosuvastatin] Other (See Comments)    Myalgias  . Indomethacin Other (See Comments)    dizziness  . Invanz [Ertapenem Sodium] Hives  . Lipitor [Atorvastatin Calcium] Other (See Comments)    Myalgias   . Reglan [Metoclopramide] Other (See Comments)    TREMORS    Social History   Socioeconomic History  . Marital status: Single    Spouse name: Not on file  . Number of children: Not on file  . Years of education: Not on file  . Highest education level: Not on file  Occupational History  . Not on file  Social Needs  . Financial resource strain: Not on file  . Food insecurity:    Worry: Not on file    Inability: Not on file  .  Transportation needs:    Medical: Not on file    Non-medical: Not on file  Tobacco Use  . Smoking status: Never Smoker  . Smokeless tobacco: Never Used  Substance and Sexual Activity  . Alcohol use: Not Currently    Frequency: Never    Comment: maybe 1 drink every few months  . Drug use: No  . Sexual activity: Not on file  Lifestyle  . Physical activity:    Days per week: Not on file    Minutes per session: Not on file  . Stress: Not on file  Relationships  . Social connections:    Talks on phone: Not on file    Gets together: Not on file    Attends religious service: Not on file    Active member of club or organization: Not on file    Attends meetings of clubs or organizations: Not on file    Relationship status: Not on file  . Intimate partner violence:    Fear of current or ex partner: Not on file    Emotionally abused: Not on file    Physically abused: Not on file    Forced sexual activity: Not on file  Other Topics Concern  . Not on file  Social History Narrative  . Not on file     Family History  Problem Relation Age of Onset  . Heart attack Mother        8 HEART ATTACKS  . Stroke Mother        3 STROKES  . Heart attack Father   . Stroke Father       Review of Systems: All other systems reviewed and are otherwise negative except as noted above.  Physical Exam: Vitals:   05/11/18 2049 05/12/18 0003 05/12/18 0349 05/12/18 0743  BP: (!) 155/69 (!) 137/47 (!) 167/66 (!) 172/71  Pulse: 63 68 69 62  Resp: 18 18 18 16   Temp: 97.8 F (36.6 C) 98.3 F (36.8 C) 98 F (36.7 C) 98.9 F (37.2 C)  TempSrc: Oral Oral Oral Oral  SpO2: 95% 95% 100% 95%  Weight:      Height:  GEN- The patient is well appearing, alert and oriented x 3 today.   Head- normocephalic, atraumatic Eyes-  Sclera clear, conjunctiva pink Ears- hearing intact Oropharynx- clear Neck- supple Lungs- CTA b/l, normal work of breathing Heart- RRR, no murmurs, rubs or gallops  GI-  soft, NT, ND, obese Extremities- no clubbing, cyanosis, or edema MS- no significant deformity or atrophy Skin- no rash or lesion Psych- euthymic mood, full affect   Labs:   Lab Results  Component Value Date   WBC 10.4 05/11/2018   HGB 9.8 (L) 05/11/2018   HCT 32.7 (L) 05/11/2018   MCV 94.2 05/11/2018   PLT 288 05/11/2018    Recent Labs  Lab 05/10/18 1908 05/11/18 0614  NA 136  --   K 3.6  --   CL 103  --   CO2 24  --   BUN 22  --   CREATININE 1.35* 1.13*  CALCIUM 9.1  --   PROT 6.6  --   BILITOT 0.5  --   ALKPHOS 89  --   ALT 13  --   AST 16  --   GLUCOSE 186*  --    Lab Results  Component Value Date   CKTOTAL 43 05/11/2018   TROPONINI <0.03 05/10/2018   Lab Results  Component Value Date   CHOL 165 05/11/2018   Lab Results  Component Value Date   HDL 44 05/11/2018   Lab Results  Component Value Date   LDLCALC 95 05/11/2018   Lab Results  Component Value Date   TRIG 130 05/11/2018   Lab Results  Component Value Date   CHOLHDL 3.8 05/11/2018   No results found for: LDLDIRECT  No results found for: DDIMER   Radiology/Studies:   Ct Angio Head W Or Wo Contrast Result Date: 05/10/2018 CLINICAL DATA:  Disoriented, confusion leaving work today. History of seizures, hypertension, hypercholesterolemia and diabetes. EXAM: CT ANGIOGRAPHY HEAD AND NECK TECHNIQUE: Multidetector CT imaging of the head and neck was performed using the standard protocol during bolus administration of intravenous contrast. Multiplanar CT image reconstructions and MIPs were obtained to evaluate the vascular anatomy. Carotid stenosis measurements (when applicable) are obtained utilizing NASCET criteria, using the distal internal carotid diameter as the denominator. CONTRAST:  58mL ISOVUE-370 IOPAMIDOL (ISOVUE-370) INJECTION 76% COMPARISON:  CT HEAD May 10, 2018. FINDINGS: CTA NECK FINDINGS: AORTIC ARCH: Normal appearance of the thoracic arch, normal branch pattern. Mild calcific  atherosclerosis aortic arch. The origins of the innominate, left Common carotid artery and subclavian artery are widely patent. RIGHT CAROTID SYSTEM: Common carotid artery is patent. Mild calcific atherosclerosis of the carotid bifurcation without hemodynamically significant stenosis by NASCET criteria. Normal appearance of the internal carotid artery. LEFT CAROTID SYSTEM: Common carotid artery is patent. Mild calcific atherosclerosis of the carotid bifurcation without hemodynamically significant stenosis by NASCET criteria. Normal appearance of the internal carotid artery. VERTEBRAL ARTERIES:Left vertebral artery is dominant. Normal appearance of the vertebral arteries, widely patent. SKELETON: No acute osseous process though bone windows have not been submitted. Moderate degenerative change of the cervical spine. OTHER NECK: Soft tissues of the neck are nonacute though, not tailored for evaluation. UPPER CHEST: Included lung apices are clear. No superior mediastinal lymphadenopathy. CTA HEAD FINDINGS: ANTERIOR CIRCULATION: Patent cervical internal carotid arteries, petrous, cavernous and supra clinoid internal carotid arteries. Patent anterior communicating artery. Patent anterior and middle cerebral arteries. No large vessel occlusion, significant stenosis, contrast extravasation or aneurysm. POSTERIOR CIRCULATION: Patent vertebral arteries, vertebrobasilar junction and basilar artery, as well  as main branch vessels. Patent posterior cerebral arteries. Bilateral posterior communicating arteries present, RIGHT PCOM infundibulum. Fetal origin RIGHT PCA. Severe stenosis RIGHT P2 segment. Moderate luminal irregularity posterior cerebral arteries. No large vessel occlusion, contrast extravasation or aneurysm. VENOUS SINUSES: Major dural venous sinuses are patent though not tailored for evaluation on this angiographic examination. ANATOMIC VARIANTS: None. DELAYED PHASE: No abnormal intracranial enhancement. MIP images  reviewed. IMPRESSION: CTA NECK: 1. No hemodynamically significant stenosis ICA's. Patent vertebral arteries. CTA HEAD: 1. No emergent large vessel occlusion. 2. Atherosclerosis disproportionately affecting posterior cerebral arteries; severe stenosis RIGHT P2 segment. Acute findings discussed with and reconfirmed by Dr.JULIE HAVILAND on 05/10/2018 at 7:04 pm. Aortic Atherosclerosis (ICD10-I70.0). Electronically Signed   By: Elon Alas M.D.   On: 05/10/2018 19:05    Mr Brain Wo Contrast Result Date: 05/11/2018 CLINICAL DATA:  72 y/o F; disorientation, confusion, possible TIA, initial exam. EXAM: MRI HEAD WITHOUT CONTRAST MRA HEAD WITHOUT CONTRAST TECHNIQUE: Multiplanar, multiecho pulse sequences of the brain and surrounding structures were obtained without intravenous contrast. Angiographic images of the head were obtained using MRA technique without contrast. COMPARISON:  05/10/2018 CT head and CTA head.  04/17/2016 MRI head. FINDINGS: MRI HEAD FINDINGS Brain: Small foci of reduced diffusion are present within the left splenium of corpus callosum and left posterior hippocampus as well as a small focus within the left occipital lobe (series 5, image [id number]). No associated hemorrhage or mass effect. Scattered nonspecific T2 FLAIR hyperintensities in subcortical and periventricular white matter as well as the pons are compatible with mild chronic microvascular ischemic changes for age. Mild volume loss of the brain. No extra-axial collection, hydrocephalus, or herniation. Stable bilobed left paramedian inferior frontal region mass with fat signal measuring 23 x 16 mm (AP by ML series 17, image 11). Vascular: As below. Skull and upper cervical spine: Normal marrow signal. Sinuses/Orbits: Left mastoid opacification. Mild paranasal sinus mucosal thickening. Bilateral intra-ocular lens replacement. Other: None. MRA HEAD FINDINGS Internal carotid arteries: Patent. Mild non stenotic irregularity of carotid siphons  compatible with atherosclerotic changes. Anterior cerebral arteries:  Patent. Middle cerebral arteries: Patent. Anterior communicating artery: Patent. Posterior communicating arteries: Fetal right PCA. Patent left posterior communicating artery. Posterior cerebral arteries: Patent right. No flow related signal within the left P4 parieto-occipital branch. Mild left P2 stenosis. Moderate to severe right P2 stenosis. Basilar artery: Patent. Short segment of mild upper basilar stenosis. Vertebral arteries: Patent. Short segment of distal left vertebral artery stenosis. No evidence of high-grade stenosis or aneurysm. IMPRESSION: MRI head: 1. Small foci of reduced diffusion present within splenium, left posterior hippocampus, and left occipital lobe compatible with acute/early subacute infarction. No associated hemorrhage or mass effect. 2. Mild chronic microvascular ischemic changes and volume loss of the brain. 3. 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 MRA head: 1. No flow related signal within left P4 parieto-occipital branch which may represent occlusion or high-grade stenosis. 2. Moderate to severe right P2 stenosis. 3. No additional vessel occlusion, high-grade stenosis, or aneurysm. These results will be called to the ordering clinician or representative by the Radiologist Assistant, and communication documented in the PACS or zVision Dashboard. Electronically Signed   By: Kristine Garbe M.D.   On: 05/11/2018 04:54    Dg Chest Portable 1 View Result Date: 05/10/2018 CLINICAL DATA:  Headache and confusion since noon. EXAM: PORTABLE CHEST 1 VIEW COMPARISON:  06/15/2016 FINDINGS: Top-normal heart size with aortic atherosclerosis. Slightly low lung volumes without acute pulmonary consolidation  nor overt pulmonary edema. No effusion or pneumothorax. Osteoarthritis of the AC joints bilaterally. IMPRESSION: Borderline cardiomegaly with aortic atherosclerosis. No active  pulmonary disease. Electronically Signed   By: Ashley Royalty M.D.   On: 05/10/2018 18:54    Ct Head Code Stroke Wo Contrast Result Date: 05/10/2018 CLINICAL DATA:  Code stroke. Confusion while leaving work this afternoon. History of seizures, hypertension, hypercholesterolemia and diabetes. EXAM: CT HEAD WITHOUT CONTRAST TECHNIQUE: Contiguous axial images were obtained from the base of the skull through the vertex without intravenous contrast. COMPARISON:  MRI head April 17, 2016 and CT HEAD April 15, 2016. FINDINGS: BRAIN: No intraparenchymal hemorrhage nor midline shift. The ventricles and sulci are normal for age. Patchy supratentorial white matter hypodensities less than expected for patient's age, though non-specific are most compatible with chronic small vessel ischemic disease. Lobulated LEFT inferior frontal lobe fat calcified mass is unchanged, no significant mass effect. No acute large vascular territory infarcts. No abnormal extra-axial fluid collections. Basal cisterns are patent. VASCULAR: Moderate calcific atherosclerosis of the carotid siphons. SKULL: No skull fracture. No significant scalp soft tissue swelling. SINUSES/ORBITS: Trace paranasal sinus mucosal thickening. LEFT mastoid effusion. Included ocular globes and orbital contents are non-suspicious. Status post bilateral ocular lens implants. OTHER: None. ASPECTS [facility] Stroke Program Early CT Score) - Ganglionic level infarction (caudate, lentiform nuclei, internal capsule, insula, M1-M3 cortex): 7 - Supraganglionic infarction (M4-M6 cortex): 3 Total score (0-10 with 10 being normal): 10 IMPRESSION: 1. No acute intracranial process. 2. ASPECTS is 10. 3. 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 4. Critical Value/emergent results were called by telephone at the time of interpretation on 05/10/2018 at 6:33 pm to Dr. Isla Pence , who verbally acknowledged  these results. Electronically Signed   By: Elon Alas M.D.   On: 05/10/2018 18:34    Vas US Carotid (at [place] Only) Result Date: 05/11/2018 Carotid Arterial Duplex Study Indications:  TIA. Risk Factors: Hypertension, hyperlipidemia, Diabetes. Performing Technologist: Maudry Mayhew MHA, RDMS, RVT, RDCS  Examination Guidelines: A complete evaluation includes B-mode imaging, spectral Doppler, color Doppler, and power Doppler as needed of all accessible portions of each vessel. Bilateral testing is considered an integral part of a complete examination. Limited examinations for reoccurring indications may be performed as noted.  Right Carotid Findings: +----------+--------+-------+--------+--------------------------------+--------+           PSV cm/sEDV    StenosisDescribe                        Comments                   cm/s                                                    +----------+--------+-------+--------+--------------------------------+--------+ CCA Prox  101     21                                                      +----------+--------+-------+--------+--------------------------------+--------+ CCA Distal78      17             smooth,  heterogenous and                                                  calcific                                 +----------+--------+-------+--------+--------------------------------+--------+ ICA Prox  82      20             smooth, heterogenous and                                                  calcific                                 +----------+--------+-------+--------+--------------------------------+--------+ ICA Distal52      14                                                      +----------+--------+-------+--------+--------------------------------+--------+ ECA       99      6                                                        +----------+--------+-------+--------+--------------------------------+--------+ +----------+--------+-------+----------------+-------------------+           PSV cm/sEDV cmsDescribe        Arm Pressure (mmHG) +----------+--------+-------+----------------+-------------------+ [id number]            Multiphasic, WNL                    +----------+--------+-------+----------------+-------------------+ +---------+--------+--+--------+--+---------+ VertebralPSV cm/s49EDV cm/s11Antegrade +---------+--------+--+--------+--+---------+  Left Carotid Findings: +----------+--------+--------+--------+---------------------+------------------+           PSV cm/sEDV cm/sStenosisDescribe             Comments           +----------+--------+--------+--------+---------------------+------------------+ CCA Prox  91      13                                                      +----------+--------+--------+--------+---------------------+------------------+ CCA Distal96      21                                   intimal thickening +----------+--------+--------+--------+---------------------+------------------+ ICA Prox  66      17              smooth, heterogenous  and calcific                            +----------+--------+--------+--------+---------------------+------------------+ ICA Distal104     32                                                      +----------+--------+--------+--------+---------------------+------------------+ ECA       103     11                                                      +----------+--------+--------+--------+---------------------+------------------+ +----------+--------+--------+----------------+-------------------+ SubclavianPSV cm/sEDV cm/sDescribe        Arm Pressure (mmHG) +----------+--------+--------+----------------+-------------------+           113              Multiphasic, WNL                    +----------+--------+--------+----------------+-------------------+ +---------+--------+---+--------+--+---------+ VertebralPSV cm/s114EDV cm/s11Antegrade +---------+--------+---+--------+--+---------+  Summary: Right Carotid: Velocities in the right ICA are consistent with a 1-39% stenosis. Left Carotid: Velocities in the left ICA are consistent with a 1-39% stenosis. Vertebrals:  Bilateral vertebral arteries demonstrate antegrade flow. Subclavians: Normal flow hemodynamics were seen in bilateral subclavian              arteries. *See table(s) above for measurements and observations.  Electronically signed by Antony Contras MD on 05/11/2018 at 2:52:19 PM.    Final     12-lead ECG SR All prior EKG's in EPIC reviewed with no documented atrial fibrillation  Telemetry SR, infrequent PACs  Assessment and Plan:  1. Cryptogenic stroke The patient presents with cryptogenic stroke.  The patient has a TEE planned for this PM.  I spoke at length with the patient about monitoring for afib with either a 30 day event monitor or an implantable loop recorder.  Risks, benefits, and alteratives to implantable loop recorder were discussed with the patient today. 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  At this time, she is very clear in their decision to proceed with implantable loop recorder.   Wound care was reviewed with the patient (keep incision clean and dry for 3 days).  Wound check will be scheduled for the patient  Please call with questions.   Renee Dyane Dustman, PA-C 05/12/2018   I have seen, examined the patient, and reviewed the above assessment and plan.  Changes to above are made where necessary.  On exam, RRR.  The patient presents with cryptogenic stroke.  TEE is unremarkable.  I spoke at length with the patient about monitoring for afib with either a 30 day event monitor or an implantable loop recorder.  Risks, benefits, and  alteratives to implantable loop recorder were discussed with the patient today.   At this time, the patient is very clear in their decision to proceed with implantable loop recorder.   Please call with questions.   Co Sign: Thompson Grayer, MD 05/12/2018 2:02 PM

## 2018-05-12 NOTE — Care Management Note (Signed)
Case Management Note  Patient Details  Name: Sharon [name] MRN: [id number] Date of Birth: 1945-10-24  Subjective/Objective:   Pt admitted with stroke. She is from home with her sister in law. She has: walker, cane at home.  Denies issues with medications or transportation.                  Action/Plan: Pt discharging home with self care. No f/u per PT/OT and no DME needs.  Pt has transportation home.   Expected Discharge Date:  05/12/18               Expected Discharge Plan:  Home/Self Care  In-House Referral:     Discharge planning Services     Post Acute Care Choice:    Choice offered to:     DME Arranged:    DME Agency:     HH Arranged:    HH Agency:     Status of Service:  Completed, signed off  If discussed at [employer] of Stay Meetings, dates discussed:    Additional Comments:  Pollie Friar, RN 05/12/2018, 3:58 PM

## 2018-05-12 NOTE — CV Procedure (Signed)
    TRANSESOPHAGEAL ECHOCARDIOGRAM   NAME:  Sharon [name]   MRN: [id number] DOB:  03/11/1946   ADMIT DATE: 05/10/2018  INDICATIONS: CVA  PROCEDURE:   Informed consent was obtained prior to the procedure. The risks, benefits and alternatives for the procedure were discussed and the patient comprehended these risks.  Risks include, but are not limited to, cough, sore throat, vomiting, nausea, somnolence, esophageal and stomach trauma or perforation, bleeding, low blood pressure, aspiration, pneumonia, infection, trauma to the teeth and death.    Procedural time out performed. Anesthesia was present and managed sedation with propofol throughout the procedure.  The transesophageal probe was inserted in the esophagus and stomach without difficulty and multiple views were obtained.    COMPLICATIONS:    There were no immediate complications.  FINDINGS:  LEFT VENTRICLE: EF = 60-65%. No regional wall motion abnormalities.  RIGHT VENTRICLE: Normal size and function.   LEFT ATRIUM: No thrombus/mass.  LEFT ATRIAL APPENDAGE: No thrombus/mass.   RIGHT ATRIUM: No thrombus/mass.  AORTIC VALVE:  Trileaflet. Mildly thickened/calcified. Trivial regurgitation. No vegetation.  MITRAL VALVE:    Normal structure with calcified annulus. Trivial regurgitation. No vegetation.  TRICUSPID VALVE: Normal structure. Trivial regurgitation. No vegetation.  PULMONIC VALVE: Grossly normal structure. Trivial regurgitation. No apparent vegetation.  INTERATRIAL SEPTUM: No PFO or ASD seen by color Doppler. Lipomatous septum.  PERICARDIUM: No effusion noted.  DESCENDING AORTA: Mild-Moderate diffuse plaque seen  Agitated saline study showed rare late bubbles, consistent with non-cardiac shunt.   CONCLUSION: No cardiac source of embolism.  Buford Dresser, MD, PhD [facility]  [address], [place], [place] [address] [phone]   12:36 PM

## 2018-05-12 NOTE — Progress Notes (Signed)
Occupational Therapy Treatment Patient Details Name: Sharon [name] MRN: [id number] DOB: 02-04-1946 Today's Date: 05/12/2018    History of present illness  Pt is a 72 y.o. female with morbid obesity, DM2, diastolic CHF, HTN and prior seizure due to [facility] who presented initially to [facility] with new onset confusion, speech deficit and LLE weakness. MRI confirms left PCA CVA   OT comments  Patient supine in bed and agreeable to OT session.  Patient completes bed mobility and transfers with modified independence.  Completed medi-cog assessment, scoring 5/10, difficulty noted with 3 word recall and attending to/following through with medication mgmt direction.  Patient educated on use of pill box at home (which she already uses) and having assistance with task initially, pt agreeable.  Educated on memory and visual compensatory strategies in order to incorporate into her daily routine, verbalize understanding.  Pt aware of deficits. Will continue to follow while admitted.    Follow Up Recommendations  No OT follow up    Equipment Recommendations  None recommended by OT    Recommendations for Other Services      Precautions / Restrictions Precautions Precautions: Fall       Mobility Bed Mobility Overal bed mobility: Modified Independent                Transfers     Transfers: Sit to/from Stand Sit to Stand: Modified independent (Device/Increase time)              Balance                                           ADL either performed or assessed with clinical judgement   ADL                                         General ADL Comments: session focused on cognition and vision     Vision   Additional Comments: continues to present with R sided field cut, reviewed compensatory stratgeies with scanning rooms and bringing into visual field   Perception     Praxis      Cognition Arousal/Alertness: Awake/alert Behavior During  Therapy: WFL for tasks assessed/performed Overall Cognitive Status: Impaired/Different from baseline Area of Impairment: Memory;Problem solving                     Memory: Decreased short-term memory       Problem Solving: Difficulty sequencing General Comments: Medi cog completed: scoring 5/10; reviewed use of pill box with assist, importance to writing down notes, and using a calender         Exercises     Shoulder Instructions       General Comments pt reports feeling as her blood sugar is low (shaky), RN notified and assesed BS 104; sister in law present during session    Pertinent Vitals/ Pain       Pain Assessment: No/denies pain  Home Living                                          Prior Functioning/Environment              Frequency  Min 2X/week  Progress Toward Goals  OT Goals(current goals can now be found in the care plan section)  Progress towards OT goals: Progressing toward goals  Acute Rehab OT Goals Patient Stated Goal: to get back to work OT Goal Formulation: With patient Time For Goal Achievement: 05/18/18 Potential to Achieve Goals: Good  Plan Discharge plan remains appropriate;Frequency remains appropriate    Co-evaluation                 AM-PAC PT "6 Clicks" Daily Activity     Outcome Measure   Help from another person eating meals?: None Help from another person taking care of personal grooming?: None Help from another person toileting, which includes using toliet, bedpan, or urinal?: None Help from another person bathing (including washing, rinsing, drying)?: None Help from another person to put on and taking off regular upper body clothing?: None Help from another person to put on and taking off regular lower body clothing?: None 6 Click Score: 24    End of Session    OT Visit Diagnosis: Other (comment);Other symptoms and signs involving cognitive function   Activity Tolerance  Patient tolerated treatment well   Patient Left with call bell/phone within reach;with family/visitor present;Other (comment)(seated EOB )   Nurse Communication Mobility status        Time: 3734-2876 OT Time Calculation (min): 23 min  Charges: OT General Charges $OT Visit: 1 Visit OT Treatments $Self Care/Home Management : 8-22 mins $Cognitive Skills Development: 8-22 mins  Delight Stare, OT Acute Rehabilitation Services Pager [phone] Office [phone]    Delight Stare 05/12/2018, 3:51 PM

## 2018-05-12 NOTE — Anesthesia Procedure Notes (Signed)
Procedure Name: MAC Date/Time: 05/12/2018 12:12 PM Performed by: Jenne Campus, CRNA Pre-anesthesia Checklist: Patient identified, Emergency Drugs available, Suction available, Timeout performed and Patient being monitored Patient Re-evaluated:Patient Re-evaluated prior to induction Oxygen Delivery Method: Nasal cannula

## 2018-05-12 NOTE — Transfer of Care (Signed)
Immediate Anesthesia Transfer of Care Note  Patient: Sharon [name]  Procedure(s) Performed: TRANSESOPHAGEAL ECHOCARDIOGRAM (TEE) (N/A ) BUBBLE STUDY  Patient Location: Endoscopy Unit  Anesthesia Type:MAC  Level of Consciousness: awake alert oriented   Airway & Oxygen Therapy: Patient Spontanous Breathing and Patient connected to nasal cannula oxygen  Post-op Assessment: Report given to RN and Post -op Vital signs reviewed and stable  Post vital signs: Reviewed and stable  Last Vitals:  Vitals Value Taken Time  BP    Temp    Pulse 74 05/12/2018 12:43 PM  Resp 20 05/12/2018 12:43 PM  SpO2 93 % 05/12/2018 12:43 PM  Vitals shown include unvalidated device data.  Last Pain:  Vitals:   05/12/18 1139  TempSrc: Oral  PainSc: 0-No pain         Complications: No apparent anesthesia complications

## 2018-05-12 NOTE — Anesthesia Postprocedure Evaluation (Signed)
Anesthesia Post Note  Patient: Ames Coupe  Procedure(s) Performed: TRANSESOPHAGEAL ECHOCARDIOGRAM (TEE) (N/A ) BUBBLE STUDY     Patient location during evaluation: PACU Anesthesia Type: General Level of consciousness: awake and alert Pain management: pain level controlled Vital Signs Assessment: post-procedure vital signs reviewed and stable Respiratory status: spontaneous breathing, nonlabored ventilation and respiratory function stable Cardiovascular status: blood pressure returned to baseline and stable Postop Assessment: no apparent nausea or vomiting Anesthetic complications: no    Last Vitals:  Vitals:   05/12/18 1305 05/12/18 1307  BP: (!) 220/108 (!) 215/73  Pulse: 64 65  Resp: (!) 23 (!) 23  Temp:    SpO2: 96% 95%    Last Pain:  Vitals:   05/12/18 1244  TempSrc: Oral  PainSc: 0-No pain                 Audry Pili

## 2018-05-12 NOTE — Interval H&P Note (Signed)
History and Physical Interval Note:  05/12/2018 11:57 AM  Sharon [name]  has presented today for surgery, with the diagnosis of STROKE  The various methods of treatment have been discussed with the patient and family. After consideration of risks, benefits and other options for treatment, the patient has consented to  Procedure(s): TRANSESOPHAGEAL ECHOCARDIOGRAM (TEE) (N/A) as a surgical intervention .  The patient's history has been reviewed, patient examined, no change in status, stable for surgery.  I have reviewed the patient's chart and labs.  Questions were answered to the patient's satisfaction.     Livan Hires Harrell Gave

## 2018-05-12 NOTE — Anesthesia Preprocedure Evaluation (Addendum)
Anesthesia Evaluation  Patient identified by MRN, date of birth, ID band Patient awake    Reviewed: Allergy & Precautions, NPO status , Patient's Chart, lab work & pertinent test results  History of Anesthesia Complications Negative for: history of anesthetic complications  Airway Mallampati: I  TM Distance: >3 FB Neck ROM: Full    Dental  (+) Edentulous Upper, Edentulous Lower   Pulmonary asthma ,    breath sounds clear to auscultation       Cardiovascular hypertension, Pt. on home beta blockers and Pt. on medications + Peripheral Vascular Disease and +CHF  + Valvular Problems/Murmurs  Rhythm:Regular Rate:Normal   '19 Carotid Duplex - 1-39% b/l ICAS  '19 TTE - Mild focal basal hypertrophy of the septum. EF 60% to 65%. Grade 1 diastolic dysfunction.  Mild AS. IVC was mildly dilated.    Neuro/Psych Seizures -, Well Controlled,  PSYCHIATRIC DISORDERS Anxiety Depression CVA, No Residual Symptoms    GI/Hepatic Neg liver ROS, GERD  Medicated,  Endo/Other  diabetes, Type 2, Insulin DependentMorbid obesity  Renal/GU Renal InsufficiencyRenal disease (kidney stones)     Musculoskeletal  (+) Arthritis ,   Abdominal (+) + obese,   Peds  Hematology  (+) anemia ,   Anesthesia Other Findings   Reproductive/Obstetrics                            Anesthesia Physical Anesthesia Plan  ASA: IV  Anesthesia Plan: MAC   Post-op Pain Management:    Induction: Intravenous  PONV Risk Score and Plan: 2 and Treatment may vary due to age or medical condition and Propofol infusion  Airway Management Planned: Mask and Natural Airway  Additional Equipment: None  Intra-op Plan:   Post-operative Plan:   Informed Consent: I have reviewed the patients History and Physical, chart, labs and discussed the procedure including the risks, benefits and alternatives for the proposed anesthesia with the patient or  authorized representative who has indicated his/her understanding and acceptance.     Plan Discussed with: CRNA and Anesthesiologist  Anesthesia Plan Comments:        Anesthesia Quick Evaluation

## 2018-05-12 NOTE — Consult Note (Addendum)
ELECTROPHYSIOLOGY CONSULT NOTE  Patient ID: Sharon [name] MRN: [id number], DOB/AGE: [birth year]   Admit date: 05/10/2018 Date of Consult: 05/12/2018  Primary Physician: [name], MD Primary Cardiologist: Dr. Marlou Porch Reason for Consultation: Cryptogenic stroke ; recommendations regarding Implantable Loop Recorder, requested by Dr. Leonie Man  History of Present Illness Sharon [name] was admitted on 05/10/2018 with stroke.  PMHx  Noted for DM, anxiety/depression, GERD, HTN, HLD, seizure (reported secondary to hyperglycemia), hx of palpitations 2015 noted with PACs, obesity.  She first developed symptoms while/after work with speech difficulties, confusion, LLE weakness.  Imaging demonstrated left spleenium infarcts embolic secondary to unknown source .  she has undergone workup for stroke including echocardiogram and carotid dopplers and angio.  The patient has been monitored on telemetry which has demonstrated sinus rhythm with no arrhythmias.  Inpatient stroke work-up is to be completed with a TEE.   Echocardiogram this admission demonstrated   Study Conclusions - Left ventricle: The cavity size was normal. There was mild focal   basal hypertrophy of the septum. Systolic function was normal.   The estimated ejection fraction was in the range of 60% to 65%.   Wall motion was normal; there were no regional wall motion   abnormalities. There was an increased relative contribution of   atrial contraction to ventricular filling. Doppler parameters are   consistent with abnormal left ventricular relaxation (grade 1   diastolic dysfunction). Doppler parameters are consistent with   high ventricular filling pressure. - Aortic valve: There was mild stenosis. Mean gradient (S): 15 mm   Hg. Valve area (VTI): 1.27 cm^2. Valve area (Vmax): 1.22 cm^2.   Valve area (Vmean): 1.38 cm^2. - Mitral valve: Calcified annulus. Mildly calcified leaflets . - Pulmonary arteries: Systolic pressure could not be  accurately   estimated. - Inferior vena cava: The vessel was mildly dilated.    Lab work is reviewed.  Prior to admission, the patient denies chest pain, shortness of breath, dizziness, or syncope.  She has hx of remote palpitations noted to be PACs and well controlled over the years with betablocker.  No ongoing/troublesome palpitations since on BB   She reports back to baseline from the stroke with some residual visual deficit only  with plans to home at discharge.   Past Medical History:  Diagnosis Date  . Anxiety   . Asthma   . CHF (congestive heart failure) ([place])   . Depression   . Diabetes mellitus   . GERD (gastroesophageal reflux disease)   . Heart murmur    echo- 04/2016- mild aortic stenosis  . History of kidney stones    passed  . Hypercholesteremia   . Hypertension   . Kidney stones    "last kidney numbers normal"  . Left ear hearing loss   . Osteoarthritis   . Seizures ([place]) 04/2016   due to Hyperosmolar Nonketotic hyperglycemia- is what it was thought to be cause by. No further seizure activity.  . Thrombophlebitis      Surgical History:  Past Surgical History:  Procedure Laterality Date  . ABDOMINAL HYSTERECTOMY    . APPENDECTOMY    . BREAST BIOPSY Right    many yrs ago per pt; neg bx  . CARPAL TUNNEL RELEASE Bilateral   . CHOLECYSTECTOMY    . COLONOSCOPY    . COLONOSCOPY WITH PROPOFOL N/A 11/03/2017   Procedure: COLONOSCOPY WITH PROPOFOL;  Surgeon: Clarene Essex, MD;  Location: [place];  Service: Endoscopy;  Laterality: N/A;  . COLONOSCOPY WITH PROPOFOL N/A  11/04/2017   Procedure: COLONOSCOPY WITH PROPOFOL;  Surgeon: Clarene Essex, MD;  Location: [place];  Service: Endoscopy;  Laterality: N/A;  . ESOPHAGOGASTRODUODENOSCOPY (EGD) WITH PROPOFOL N/A 11/03/2017   Procedure: ESOPHAGOGASTRODUODENOSCOPY (EGD) WITH PROPOFOL;  Surgeon: Clarene Essex, MD;  Location: [place];  Service: Endoscopy;  Laterality: N/A;  . ESOPHAGOSCOPY    . EYE SURGERY  Bilateral    Cataract with lens  . INCISION AND DRAINAGE PERIRECTAL ABSCESS    . pyelogram       Medications Prior to Admission  Medication Sig Dispense Refill Last Dose  . beta carotene w/minerals (OCUVITE) tablet Take 1 tablet by mouth daily.   05/10/2018 at Unknown time  . Calcium Carbonate-Vitamin D (CALTRATE 600+D) 600-400 MG-UNIT per tablet Take 2 tablets by mouth daily.    05/10/2018 at Unknown time  . Carboxymethylcellul-Glycerin (LUBRICATING EYE DROPS OP) Place 1-2 drops into both eyes 4 (four) times daily as needed (for dry eyes).   unk at prn  . dexlansoprazole (DEXILANT) 60 MG capsule Take 60 mg by mouth daily.     05/10/2018 at Unknown time  . dextromethorphan-guaiFENesin (MUCINEX DM) 30-600 MG 12hr tablet Take 1 tablet by mouth 2 (two) times daily as needed for cough.   unk at prn  . ferrous sulfate 325 (65 FE) MG tablet Take 325 mg by mouth daily with breakfast.   05/10/2018 at Unknown time  . furosemide (LASIX) 40 MG tablet Take 40 mg by mouth 2 (two) times daily.   05/10/2018 at Unknown time  . hydrochlorothiazide (HYDRODIURIL) 25 MG tablet Take 25 mg by mouth daily.   05/10/2018 at Unknown time  . ibuprofen (ADVIL,MOTRIN) 200 MG tablet Take 800-1,200 mg by mouth every 6 (six) hours as needed for headache or moderate pain.    unk at prn  . insulin aspart (NOVOLOG FLEXPEN) 100 UNIT/ML FlexPen Inject 10-30 Units into the skin 3 (three) times daily with meals. Per sliding scale    05/10/2018 at Unknown time  . Lidocaine-Hydrocortisone Ace 3-0.5 % CREA Apply 1 application topically daily as needed (for itching).   unk at prn  . liraglutide (VICTOZA) 18 MG/3ML SOPN Inject 1.8 mg into the skin daily.   05/10/2018 at Unknown time  . lisinopril (PRINIVIL,ZESTRIL) 10 MG tablet Take 10 mg by mouth daily.   05/10/2018 at Unknown time  . meloxicam (MOBIC) 15 MG tablet Take 15 mg by mouth daily.   05/10/2018 at Unknown time  . metoprolol succinate (TOPROL-XL) 25 MG 24 hr tablet Take 1  tablet (25 mg total) by mouth daily. 90 tablet 3 05/10/2018 at Unknown time  . ondansetron (ZOFRAN) 4 MG tablet Take 4 mg by mouth every 8 (eight) hours as needed for nausea or vomiting.   0 unk at prn  . PARoxetine (PAXIL) 40 MG tablet Take 80 mg by mouth every morning.    05/10/2018 at Unknown time  . polyethylene glycol (MIRALAX / GLYCOLAX) packet Take 17 g by mouth daily as needed for mild constipation.   unk at prn  . Probiotic Product (ALIGN) 4 MG CAPS Take 8 mg by mouth daily.   05/10/2018 at Unknown time  . simvastatin (ZOCOR) 40 MG tablet Take 40 mg by mouth every evening.  2 05/09/2018  . TRESIBA FLEXTOUCH 200 UNIT/ML SOPN Inject 80 Units into the skin daily. Will need further dose adjustment at Follow up 5 pen 2 05/10/2018 at Unknown time    Inpatient Medications:  . aspirin EC  81 mg Oral Daily  .  clopidogrel  75 mg Oral Daily  . enoxaparin (LOVENOX) injection  40 mg Subcutaneous Daily  . ferrous sulfate  325 mg Oral Q breakfast  . insulin aspart  0-9 Units Subcutaneous Q4H  . insulin glargine  80 Units Subcutaneous Daily  . metoprolol succinate  25 mg Oral Daily  . pantoprazole  40 mg Oral Daily  . PARoxetine  80 mg Oral q morning - 10a  . simvastatin  40 mg Oral QPM    Allergies:  Allergies  Allergen Reactions  . Crestor [Rosuvastatin] Other (See Comments)    Myalgias  . Indomethacin Other (See Comments)    dizziness  . Invanz [Ertapenem Sodium] Hives  . Lipitor [Atorvastatin Calcium] Other (See Comments)    Myalgias   . Reglan [Metoclopramide] Other (See Comments)    TREMORS    Social History   Socioeconomic History  . Marital status: Single    Spouse name: Not on file  . Number of children: Not on file  . Years of education: Not on file  . Highest education level: Not on file  Occupational History  . Not on file  Social Needs  . Financial resource strain: Not on file  . Food insecurity:    Worry: Not on file    Inability: Not on file  .  Transportation needs:    Medical: Not on file    Non-medical: Not on file  Tobacco Use  . Smoking status: Never Smoker  . Smokeless tobacco: Never Used  Substance and Sexual Activity  . Alcohol use: Not Currently    Frequency: Never    Comment: maybe 1 drink every few months  . Drug use: No  . Sexual activity: Not on file  Lifestyle  . Physical activity:    Days per week: Not on file    Minutes per session: Not on file  . Stress: Not on file  Relationships  . Social connections:    Talks on phone: Not on file    Gets together: Not on file    Attends religious service: Not on file    Active member of club or organization: Not on file    Attends meetings of clubs or organizations: Not on file    Relationship status: Not on file  . Intimate partner violence:    Fear of current or ex partner: Not on file    Emotionally abused: Not on file    Physically abused: Not on file    Forced sexual activity: Not on file  Other Topics Concern  . Not on file  Social History Narrative  . Not on file     Family History  Problem Relation Age of Onset  . Heart attack Mother        8 HEART ATTACKS  . Stroke Mother        3 STROKES  . Heart attack Father   . Stroke Father       Review of Systems: All other systems reviewed and are otherwise negative except as noted above.  Physical Exam: Vitals:   05/11/18 2049 05/12/18 0003 05/12/18 0349 05/12/18 0743  BP: (!) 155/69 (!) 137/47 (!) 167/66 (!) 172/71  Pulse: 63 68 69 62  Resp: 18 18 18 16   Temp: 97.8 F (36.6 C) 98.3 F (36.8 C) 98 F (36.7 C) 98.9 F (37.2 C)  TempSrc: Oral Oral Oral Oral  SpO2: 95% 95% 100% 95%  Weight:      Height:  GEN- The patient is well appearing, alert and oriented x 3 today.   Head- normocephalic, atraumatic Eyes-  Sclera clear, conjunctiva pink Ears- hearing intact Oropharynx- clear Neck- supple Lungs- CTA b/l, normal work of breathing Heart- RRR, no murmurs, rubs or gallops  GI-  soft, NT, ND, obese Extremities- no clubbing, cyanosis, or edema MS- no significant deformity or atrophy Skin- no rash or lesion Psych- euthymic mood, full affect   Labs:   Lab Results  Component Value Date   WBC 10.4 05/11/2018   HGB 9.8 (L) 05/11/2018   HCT 32.7 (L) 05/11/2018   MCV 94.2 05/11/2018   PLT 288 05/11/2018    Recent Labs  Lab 05/10/18 1908 05/11/18 0614  NA 136  --   K 3.6  --   CL 103  --   CO2 24  --   BUN 22  --   CREATININE 1.35* 1.13*  CALCIUM 9.1  --   PROT 6.6  --   BILITOT 0.5  --   ALKPHOS 89  --   ALT 13  --   AST 16  --   GLUCOSE 186*  --    Lab Results  Component Value Date   CKTOTAL 43 05/11/2018   TROPONINI <0.03 05/10/2018   Lab Results  Component Value Date   CHOL 165 05/11/2018   Lab Results  Component Value Date   HDL 44 05/11/2018   Lab Results  Component Value Date   LDLCALC 95 05/11/2018   Lab Results  Component Value Date   TRIG 130 05/11/2018   Lab Results  Component Value Date   CHOLHDL 3.8 05/11/2018   No results found for: LDLDIRECT  No results found for: DDIMER   Radiology/Studies:   Ct Angio Head W Or Wo Contrast Result Date: 05/10/2018 CLINICAL DATA:  Disoriented, confusion leaving work today. History of seizures, hypertension, hypercholesterolemia and diabetes. EXAM: CT ANGIOGRAPHY HEAD AND NECK TECHNIQUE: Multidetector CT imaging of the head and neck was performed using the standard protocol during bolus administration of intravenous contrast. Multiplanar CT image reconstructions and MIPs were obtained to evaluate the vascular anatomy. Carotid stenosis measurements (when applicable) are obtained utilizing NASCET criteria, using the distal internal carotid diameter as the denominator. CONTRAST:  1mL ISOVUE-370 IOPAMIDOL (ISOVUE-370) INJECTION 76% COMPARISON:  CT HEAD May 10, 2018. FINDINGS: CTA NECK FINDINGS: AORTIC ARCH: Normal appearance of the thoracic arch, normal branch pattern. Mild calcific  atherosclerosis aortic arch. The origins of the innominate, left Common carotid artery and subclavian artery are widely patent. RIGHT CAROTID SYSTEM: Common carotid artery is patent. Mild calcific atherosclerosis of the carotid bifurcation without hemodynamically significant stenosis by NASCET criteria. Normal appearance of the internal carotid artery. LEFT CAROTID SYSTEM: Common carotid artery is patent. Mild calcific atherosclerosis of the carotid bifurcation without hemodynamically significant stenosis by NASCET criteria. Normal appearance of the internal carotid artery. VERTEBRAL ARTERIES:Left vertebral artery is dominant. Normal appearance of the vertebral arteries, widely patent. SKELETON: No acute osseous process though bone windows have not been submitted. Moderate degenerative change of the cervical spine. OTHER NECK: Soft tissues of the neck are nonacute though, not tailored for evaluation. UPPER CHEST: Included lung apices are clear. No superior mediastinal lymphadenopathy. CTA HEAD FINDINGS: ANTERIOR CIRCULATION: Patent cervical internal carotid arteries, petrous, cavernous and supra clinoid internal carotid arteries. Patent anterior communicating artery. Patent anterior and middle cerebral arteries. No large vessel occlusion, significant stenosis, contrast extravasation or aneurysm. POSTERIOR CIRCULATION: Patent vertebral arteries, vertebrobasilar junction and basilar artery, as well  as main branch vessels. Patent posterior cerebral arteries. Bilateral posterior communicating arteries present, RIGHT PCOM infundibulum. Fetal origin RIGHT PCA. Severe stenosis RIGHT P2 segment. Moderate luminal irregularity posterior cerebral arteries. No large vessel occlusion, contrast extravasation or aneurysm. VENOUS SINUSES: Major dural venous sinuses are patent though not tailored for evaluation on this angiographic examination. ANATOMIC VARIANTS: None. DELAYED PHASE: No abnormal intracranial enhancement. MIP images  reviewed. IMPRESSION: CTA NECK: 1. No hemodynamically significant stenosis ICA's. Patent vertebral arteries. CTA HEAD: 1. No emergent large vessel occlusion. 2. Atherosclerosis disproportionately affecting posterior cerebral arteries; severe stenosis RIGHT P2 segment. Acute findings discussed with and reconfirmed by Dr.JULIE HAVILAND on 05/10/2018 at 7:04 pm. Aortic Atherosclerosis (ICD10-I70.0). Electronically Signed   By: Elon Alas M.D.   On: 05/10/2018 19:05    Mr Brain Wo Contrast Result Date: 05/11/2018 CLINICAL DATA:  72 y/o F; disorientation, confusion, possible TIA, initial exam. EXAM: MRI HEAD WITHOUT CONTRAST MRA HEAD WITHOUT CONTRAST TECHNIQUE: Multiplanar, multiecho pulse sequences of the brain and surrounding structures were obtained without intravenous contrast. Angiographic images of the head were obtained using MRA technique without contrast. COMPARISON:  05/10/2018 CT head and CTA head.  04/17/2016 MRI head. FINDINGS: MRI HEAD FINDINGS Brain: Small foci of reduced diffusion are present within the left splenium of corpus callosum and left posterior hippocampus as well as a small focus within the left occipital lobe (series 5, image [id number]). No associated hemorrhage or mass effect. Scattered nonspecific T2 FLAIR hyperintensities in subcortical and periventricular white matter as well as the pons are compatible with mild chronic microvascular ischemic changes for age. Mild volume loss of the brain. No extra-axial collection, hydrocephalus, or herniation. Stable bilobed left paramedian inferior frontal region mass with fat signal measuring 23 x 16 mm (AP by ML series 17, image 11). Vascular: As below. Skull and upper cervical spine: Normal marrow signal. Sinuses/Orbits: Left mastoid opacification. Mild paranasal sinus mucosal thickening. Bilateral intra-ocular lens replacement. Other: None. MRA HEAD FINDINGS Internal carotid arteries: Patent. Mild non stenotic irregularity of carotid siphons  compatible with atherosclerotic changes. Anterior cerebral arteries:  Patent. Middle cerebral arteries: Patent. Anterior communicating artery: Patent. Posterior communicating arteries: Fetal right PCA. Patent left posterior communicating artery. Posterior cerebral arteries: Patent right. No flow related signal within the left P4 parieto-occipital branch. Mild left P2 stenosis. Moderate to severe right P2 stenosis. Basilar artery: Patent. Short segment of mild upper basilar stenosis. Vertebral arteries: Patent. Short segment of distal left vertebral artery stenosis. No evidence of high-grade stenosis or aneurysm. IMPRESSION: MRI head: 1. Small foci of reduced diffusion present within splenium, left posterior hippocampus, and left occipital lobe compatible with acute/early subacute infarction. No associated hemorrhage or mass effect. 2. Mild chronic microvascular ischemic changes and volume loss of the brain. 3. 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 MRA head: 1. No flow related signal within left P4 parieto-occipital branch which may represent occlusion or high-grade stenosis. 2. Moderate to severe right P2 stenosis. 3. No additional vessel occlusion, high-grade stenosis, or aneurysm. These results will be called to the ordering clinician or representative by the Radiologist Assistant, and communication documented in the PACS or zVision Dashboard. Electronically Signed   By: Kristine Garbe M.D.   On: 05/11/2018 04:54    Dg Chest Portable 1 View Result Date: 05/10/2018 CLINICAL DATA:  Headache and confusion since noon. EXAM: PORTABLE CHEST 1 VIEW COMPARISON:  06/15/2016 FINDINGS: Top-normal heart size with aortic atherosclerosis. Slightly low lung volumes without acute pulmonary consolidation  nor overt pulmonary edema. No effusion or pneumothorax. Osteoarthritis of the AC joints bilaterally. IMPRESSION: Borderline cardiomegaly with aortic atherosclerosis. No active  pulmonary disease. Electronically Signed   By: Ashley Royalty M.D.   On: 05/10/2018 18:54    Ct Head Code Stroke Wo Contrast Result Date: 05/10/2018 CLINICAL DATA:  Code stroke. Confusion while leaving work this afternoon. History of seizures, hypertension, hypercholesterolemia and diabetes. EXAM: CT HEAD WITHOUT CONTRAST TECHNIQUE: Contiguous axial images were obtained from the base of the skull through the vertex without intravenous contrast. COMPARISON:  MRI head April 17, 2016 and CT HEAD April 15, 2016. FINDINGS: BRAIN: No intraparenchymal hemorrhage nor midline shift. The ventricles and sulci are normal for age. Patchy supratentorial white matter hypodensities less than expected for patient's age, though non-specific are most compatible with chronic small vessel ischemic disease. Lobulated LEFT inferior frontal lobe fat calcified mass is unchanged, no significant mass effect. No acute large vascular territory infarcts. No abnormal extra-axial fluid collections. Basal cisterns are patent. VASCULAR: Moderate calcific atherosclerosis of the carotid siphons. SKULL: No skull fracture. No significant scalp soft tissue swelling. SINUSES/ORBITS: Trace paranasal sinus mucosal thickening. LEFT mastoid effusion. Included ocular globes and orbital contents are non-suspicious. Status post bilateral ocular lens implants. OTHER: None. ASPECTS [facility] Stroke Program Early CT Score) - Ganglionic level infarction (caudate, lentiform nuclei, internal capsule, insula, M1-M3 cortex): 7 - Supraganglionic infarction (M4-M6 cortex): 3 Total score (0-10 with 10 being normal): 10 IMPRESSION: 1. No acute intracranial process. 2. ASPECTS is 10. 3. 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 4. Critical Value/emergent results were called by telephone at the time of interpretation on 05/10/2018 at 6:33 pm to Dr. Isla Pence , who verbally acknowledged  these results. Electronically Signed   By: Elon Alas M.D.   On: 05/10/2018 18:34    Vas US Carotid (at [facility] Only) Result Date: 05/11/2018 Carotid Arterial Duplex Study Indications:  TIA. Risk Factors: Hypertension, hyperlipidemia, Diabetes. Performing Technologist: Maudry Mayhew MHA, RDMS, RVT, RDCS  Examination Guidelines: A complete evaluation includes B-mode imaging, spectral Doppler, color Doppler, and power Doppler as needed of all accessible portions of each vessel. Bilateral testing is considered an integral part of a complete examination. Limited examinations for reoccurring indications may be performed as noted.  Right Carotid Findings: +----------+--------+-------+--------+--------------------------------+--------+           PSV cm/sEDV    StenosisDescribe                        Comments                   cm/s                                                    +----------+--------+-------+--------+--------------------------------+--------+ CCA Prox  101     21                                                      +----------+--------+-------+--------+--------------------------------+--------+ CCA Distal78      17             smooth,  heterogenous and                                                  calcific                                 +----------+--------+-------+--------+--------------------------------+--------+ ICA Prox  82      20             smooth, heterogenous and                                                  calcific                                 +----------+--------+-------+--------+--------------------------------+--------+ ICA Distal52      14                                                      +----------+--------+-------+--------+--------------------------------+--------+ ECA       99      6                                                        +----------+--------+-------+--------+--------------------------------+--------+ +----------+--------+-------+----------------+-------------------+           PSV cm/sEDV cmsDescribe        Arm Pressure (mmHG) +----------+--------+-------+----------------+-------------------+ [id number]            Multiphasic, WNL                    +----------+--------+-------+----------------+-------------------+ +---------+--------+--+--------+--+---------+ VertebralPSV cm/s49EDV cm/s11Antegrade +---------+--------+--+--------+--+---------+  Left Carotid Findings: +----------+--------+--------+--------+---------------------+------------------+           PSV cm/sEDV cm/sStenosisDescribe             Comments           +----------+--------+--------+--------+---------------------+------------------+ CCA Prox  91      13                                                      +----------+--------+--------+--------+---------------------+------------------+ CCA Distal96      21                                   intimal thickening +----------+--------+--------+--------+---------------------+------------------+ ICA Prox  66      17              smooth, heterogenous  and calcific                            +----------+--------+--------+--------+---------------------+------------------+ ICA Distal104     32                                                      +----------+--------+--------+--------+---------------------+------------------+ ECA       103     11                                                      +----------+--------+--------+--------+---------------------+------------------+ +----------+--------+--------+----------------+-------------------+ SubclavianPSV cm/sEDV cm/sDescribe        Arm Pressure (mmHG) +----------+--------+--------+----------------+-------------------+           113              Multiphasic, WNL                    +----------+--------+--------+----------------+-------------------+ +---------+--------+---+--------+--+---------+ VertebralPSV cm/s114EDV cm/s11Antegrade +---------+--------+---+--------+--+---------+  Summary: Right Carotid: Velocities in the right ICA are consistent with a 1-39% stenosis. Left Carotid: Velocities in the left ICA are consistent with a 1-39% stenosis. Vertebrals:  Bilateral vertebral arteries demonstrate antegrade flow. Subclavians: Normal flow hemodynamics were seen in bilateral subclavian              arteries. *See table(s) above for measurements and observations.  Electronically signed by Antony Contras MD on 05/11/2018 at 2:52:19 PM.    Final     12-lead ECG SR All prior EKG's in EPIC reviewed with no documented atrial fibrillation  Telemetry SR, infrequent PACs  Assessment and Plan:  1. Cryptogenic stroke The patient presents with cryptogenic stroke.  The patient has a TEE planned for this PM.  I spoke at length with the patient about monitoring for afib with either a 30 day event monitor or an implantable loop recorder.  Risks, benefits, and alteratives to implantable loop recorder were discussed with the patient today. 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  At this time, she is very clear in their decision to proceed with implantable loop recorder.   Wound care was reviewed with the patient (keep incision clean and dry for 3 days).  Wound check will be scheduled for the patient  Please call with questions.   Renee Dyane Dustman, PA-C 05/12/2018   I have seen, examined the patient, and reviewed the above assessment and plan.  Changes to above are made where necessary.  On exam, RRR.  The patient presents with cryptogenic stroke.  TEE is unremarkable.  I spoke at length with the patient about monitoring for afib with either a 30 day event monitor or an implantable loop recorder.  Risks, benefits, and  alteratives to implantable loop recorder were discussed with the patient today.   At this time, the patient is very clear in their decision to proceed with implantable loop recorder.   Please call with questions.   Co Sign: Thompson Grayer, MD 05/12/2018 2:02 PM

## 2018-05-12 NOTE — Evaluation (Signed)
Speech Language Pathology Evaluation Patient Details Name: Sharon [name] MRN: [id number] DOB: April 22, 1946 Today's Date: 05/12/2018 Time: 1448-1856 SLP Time Calculation (min) (ACUTE ONLY): 16 min  Problem List:  Patient Active Problem List   Diagnosis Date Noted  . Cerebral thrombosis with cerebral infarction 05/11/2018  . CVA (cerebral vascular accident) ([place]) 05/11/2018  . Stroke-like symptoms 05/10/2018  . Hyperosmolar (nonketotic) coma ([place]) 04/15/2016  . Seizure ([place]) 04/15/2016  . AKI (acute kidney injury) ([place]) 04/15/2016  . Chronic diastolic heart failure ([place]) 04/15/2016  . Acute on chronic diastolic (congestive) heart failure ([place]) 06/04/2015  . Diverticulitis 05/28/2015  . Sigmoid diverticulitis 05/28/2015  . Hypokalemia 05/28/2015  . Diabetes mellitus type 2, controlled ([place]) 05/28/2015  . Chronic anemia 05/28/2015  . Diverticulitis of large intestine without perforation or abscess without bleeding   . Palpitation 04/12/2015  . Morbid obesity ([place]) 04/12/2015  . Mild aortic stenosis 04/12/2015  . Type 2 diabetes mellitus without complication ([place]) 31/49/7026  . Diabetes mellitus ([place]) 06/27/2014  . Hypertension 06/27/2014  . Chest pain 06/27/2014  . Asthma 06/27/2014   Past Medical History:  Past Medical History:  Diagnosis Date  . Anxiety   . Asthma   . CHF (congestive heart failure) ([place])   . Depression   . Diabetes mellitus   . GERD (gastroesophageal reflux disease)   . Heart murmur    echo- 04/2016- mild aortic stenosis  . History of kidney stones    passed  . Hypercholesteremia   . Hypertension   . Kidney stones    "last kidney numbers normal"  . Left ear hearing loss   . Osteoarthritis   . Seizures ([place]) 04/2016   due to Hyperosmolar Nonketotic hyperglycemia- is what it was thought to be cause by. No further seizure activity.  . Thrombophlebitis    Past Surgical History:  Past Surgical History:  Procedure Laterality Date  . ABDOMINAL  HYSTERECTOMY    . APPENDECTOMY    . BREAST BIOPSY Right    many yrs ago per pt; neg bx  . CARPAL TUNNEL RELEASE Bilateral   . CHOLECYSTECTOMY    . COLONOSCOPY    . COLONOSCOPY WITH PROPOFOL N/A 11/03/2017   Procedure: COLONOSCOPY WITH PROPOFOL;  Surgeon: Clarene Essex, MD;  Location: [facility];  Service: Endoscopy;  Laterality: N/A;  . COLONOSCOPY WITH PROPOFOL N/A 11/04/2017   Procedure: COLONOSCOPY WITH PROPOFOL;  Surgeon: Clarene Essex, MD;  Location: [place];  Service: Endoscopy;  Laterality: N/A;  . ESOPHAGOGASTRODUODENOSCOPY (EGD) WITH PROPOFOL N/A 11/03/2017   Procedure: ESOPHAGOGASTRODUODENOSCOPY (EGD) WITH PROPOFOL;  Surgeon: Clarene Essex, MD;  Location: [place];  Service: Endoscopy;  Laterality: N/A;  . ESOPHAGOSCOPY    . EYE SURGERY Bilateral    Cataract with lens  . INCISION AND DRAINAGE PERIRECTAL ABSCESS    . pyelogram    . TEE WITHOUT CARDIOVERSION N/A 05/12/2018   Procedure: TRANSESOPHAGEAL ECHOCARDIOGRAM (TEE);  Surgeon: Buford Dresser, MD;  Location: [facility] ENDOSCOPY;  Service: Cardiovascular;  Laterality: N/A;   HPI:  Sharon [name] is an 72 y.o. female with morbid obesity, DM2, diastolic CHF, HTN and prior seizure due to [facility] who presented initially to [facility] with new onset confusion, speech deficit and LLE weakness. Symptoms began on Monday after work (she works at [employer]) at about 4:30 PM. When attempting to drive home, she got lost, requiring her to call a friend for help. Her friend then brought her to the ED at [facility]. A Teleneurology consult was initiated. She had an NIHSS of 1.  CTA head and neck showed no LVO but with some regions of moderate to severe atherosclerotic narrowing were noted in the posterior circulation. She also had difficulty with speech, but this symptom has since resolved. She has had some LLE weakness which still had not resolved at the time of transfer from [facility] to [facility]. MRI confirms left PCA CVA. Small foci of reduced diffusion present within  splenium, left posterior hippocampus, and left occipital lobe compatible with acute/early subacute infarction. No associated hemorrhage or mass effect. Pt reproted a history of dysphagia, esophagram in 2017 normal.    Assessment / Plan / Recommendation Clinical Impression  Pt presents with very mild cognitive impairment that impacts delayed recall of new (nonfunctional) information such as recalling 3 random words after delay. However pt is able to recall new information as it pertains to her and her plan of care. Pt also aware of vision deficits and able to problem solve how to compensate for deficits. Pt's sister-in-law present and able to return demonstrate compensatory strategies for vision and delayed recall of information. All education completed and pt states that she is discharging this evening. ST to sign off.     SLP Assessment  SLP Recommendation/Assessment: Patient does not need any further Speech Lanaguage Pathology Services SLP Visit Diagnosis: Cognitive communication deficit (R41.841)    Follow Up Recommendations  None          SLP Evaluation Cognition  Overall Cognitive Status: Impaired/Different from baseline Arousal/Alertness: Awake/alert Orientation Level: Oriented X4 Attention: Selective Selective Attention: Appears intact Memory: Impaired Memory Impairment: Decreased recall of new information;Decreased short term memory Decreased Short Term Memory: Verbal basic;Functional basic Awareness: Appears intact Problem Solving: Appears intact Safety/Judgment: Appears intact       Comprehension  Auditory Comprehension Overall Auditory Comprehension: Appears within functional limits for tasks assessed Visual Recognition/Discrimination Discrimination: Within Function Limits Reading Comprehension Reading Status: Not tested    Expression Expression Primary Mode of Expression: Verbal Verbal Expression Overall Verbal Expression: Appears within functional limits for  tasks assessed Written Expression Dominant Hand: Right Written Expression: Not tested   Oral / Motor  Oral Motor/Sensory Function Overall Oral Motor/Sensory Function: Mild impairment Facial ROM: Within Functional Limits Facial Symmetry: Within Functional Limits Facial Strength: Within Functional Limits Motor Speech Overall Motor Speech: Appears within functional limits for tasks assessed Intelligibility: Intelligible Motor Planning: Witnin functional limits Motor Speech Errors: Not applicable   GO                    Toddy Boyd 05/12/2018, 5:02 PM

## 2018-05-12 NOTE — Discharge Summary (Signed)
Physician Discharge Summary  Sharon [name] ACZ:[id number] DOB: 09-Jun-1946 DOA: 05/10/2018  PCP: [name], MD  Admit date: 05/10/2018 Discharge date: 05/12/2018  Admitted From: Home  Disposition:  Home.   Recommendations for Outpatient Follow-up:  1. Follow up with PCP in 1-2 weeks 2. Please obtain BMP/CBC in one week Please follow up with neurology and cardiology as recommended.  Please follow up with opthalmology for visual field defects.  You are not advised to drive until you get cleared from ophthalmology.   Discharge Condition:stable.  CODE STATUS: full code.  Diet recommendation: Heart Healthy    Brief/Interim Summary: Sharon [name] a 72 y.o.femalewith medical history significant ofDM2, HTN, diastolic CHF, anxiety. Patient presents to the ED at [facility] with c/o difficulty with speech and L leg weakness.  Neurology requested CTA head and neck which showed no emergent large vessel occlusion, though it did show severe R PCA stenosis P2 segment. Patient sent to [facility] for TIA / Stroke work up.  Discharge Diagnoses:  Principal Problem:   Stroke-like symptoms Active Problems:   Hypertension   Diabetes mellitus type 2, controlled ([place])   Cerebral thrombosis with cerebral infarction   CVA (cerebral vascular accident) ([place])  Left splenium infarcts Of unclear etiology.  CT angio of the head and neck no ELVO.  MRA mod to severe RP2 stenosis.  Carotid duplex B [place]  1 to 39% stenosis. 2 D echo does not show any embolus.  TEE is negative.  LOOP recorder placed. On aspirin 81 mg daily and plavix 75 mg daily for 3 weeks.  Pt advised not to drive. Due field defects.  LDL is 95 on statin.   hgba1c is 8.7, uncontrolled.    Hypertension:  Sub optimally controlled.  Resume home meds slowly.    Diabetes Mellitus: CBG (last 3)  Recent Labs    05/12/18 1011 05/12/18 1157 05/12/18 1458  GLUCAP 111* 93 104*   Better controlled in the hospital. A1c is 8.7.     Hyperlipidemia:  On zocor , LDL is 95.     Discharge Instructions  Discharge Instructions    Diet - low sodium heart healthy   Complete by:  As directed    Discharge instructions   Complete by:  As directed    Please follow up with PCP in one week.  Please follow u with Neurology as recommended, in 4 weeks.  Please follow up cardiology as scheduled.   Patient advised to not drive due to field cut identified Needs field cut eval, recommend OP opthalmology followup to check optomteric visual fieldsassess further for driving     Allergies as of 05/12/2018      Reactions   Crestor [rosuvastatin] Other (See Comments)   Myalgias   Indomethacin Other (See Comments)   dizziness   Invanz [ertapenem Sodium] Hives   Lipitor [atorvastatin Calcium] Other (See Comments)   Myalgias    Reglan [metoclopramide] Other (See Comments)   TREMORS      Medication List    STOP taking these medications   hydrochlorothiazide 25 MG tablet Commonly known as:  HYDRODIURIL   ibuprofen 200 MG tablet Commonly known as:  ADVIL,MOTRIN     TAKE these medications   ALIGN 4 MG Caps Take 8 mg by mouth daily.   aspirin 81 MG EC tablet Take 1 tablet (81 mg total) by mouth daily.   beta carotene w/minerals tablet Take 1 tablet by mouth daily.   CALTRATE 600+D 600-400 MG-UNIT tablet Generic drug:  Calcium Carbonate-Vitamin D Take  2 tablets by mouth daily.   clopidogrel 75 MG tablet Commonly known as:  PLAVIX Take 1 tablet (75 mg total) by mouth daily.   DEXILANT 60 MG capsule Generic drug:  dexlansoprazole Take 60 mg by mouth daily.   dextromethorphan-guaiFENesin 30-600 MG 12hr tablet Commonly known as:  MUCINEX DM Take 1 tablet by mouth 2 (two) times daily as needed for cough.   ferrous sulfate 325 (65 FE) MG tablet Take 325 mg by mouth daily with breakfast.   furosemide 40 MG tablet Commonly known as:  LASIX Take 40 mg by mouth 2 (two) times daily.   Lidocaine-Hydrocortisone  Ace 3-0.5 % Crea Apply 1 application topically daily as needed (for itching).   liraglutide 18 MG/3ML Sopn Commonly known as:  VICTOZA Inject 1.8 mg into the skin daily.   lisinopril 10 MG tablet Commonly known as:  PRINIVIL,ZESTRIL Take 10 mg by mouth daily.   LUBRICATING EYE DROPS OP Place 1-2 drops into both eyes 4 (four) times daily as needed (for dry eyes).   meloxicam 15 MG tablet Commonly known as:  MOBIC Take 15 mg by mouth daily.   metoprolol succinate 25 MG 24 hr tablet Commonly known as:  TOPROL-XL Take 1 tablet (25 mg total) by mouth daily.   NOVOLOG FLEXPEN 100 UNIT/ML FlexPen Generic drug:  insulin aspart Inject 10-30 Units into the skin 3 (three) times daily with meals. Per sliding scale   ondansetron 4 MG tablet Commonly known as:  ZOFRAN Take 1 tablet (4 mg total) by mouth every 8 (eight) hours as needed for nausea or vomiting.   PARoxetine 40 MG tablet Commonly known as:  PAXIL Take 80 mg by mouth every morning.   polyethylene glycol packet Commonly known as:  MIRALAX / GLYCOLAX Take 17 g by mouth daily as needed for mild constipation.   simvastatin 40 MG tablet Commonly known as:  ZOCOR Take 40 mg by mouth every evening.   TRESIBA FLEXTOUCH 200 UNIT/ML Sopn Generic drug:  Insulin Degludec Inject 80 Units into the skin daily. Will need further dose adjustment at Follow up      Follow-up Information    [name], MD. Schedule an appointment as soon as possible for a visit in 1 week(s).   Specialty:  Family Medicine Contact information: [address] [place] [address] [phone]        Thompson Grayer, MD. Schedule an appointment as soon as possible for a visit in 3 week(s).   Specialty:  Cardiology Contact information: [place] [address] [phone]        Garvin Fila, MD. Schedule an appointment as soon as possible for a visit in 1 month(s).   Specialties:  Neurology,  Radiology Contact information: [address] [place] [address] [phone]          Allergies  Allergen Reactions  . Crestor [Rosuvastatin] Other (See Comments)    Myalgias  . Indomethacin Other (See Comments)    dizziness  . Invanz [Ertapenem Sodium] Hives  . Lipitor [Atorvastatin Calcium] Other (See Comments)    Myalgias   . Reglan [Metoclopramide] Other (See Comments)    TREMORS    Consultations:  Neurology  Cardiology.    Procedures/Studies: Ct Angio Head W Or Wo Contrast  Result Date: 05/10/2018 CLINICAL DATA:  Disoriented, confusion leaving work today. History of seizures, hypertension, hypercholesterolemia and diabetes. EXAM: CT ANGIOGRAPHY HEAD AND NECK TECHNIQUE: Multidetector CT imaging of the head  and neck was performed using the standard protocol during bolus administration of intravenous contrast. Multiplanar CT image reconstructions and MIPs were obtained to evaluate the vascular anatomy. Carotid stenosis measurements (when applicable) are obtained utilizing NASCET criteria, using the distal internal carotid diameter as the denominator. CONTRAST:  100mL ISOVUE-370 IOPAMIDOL (ISOVUE-370) INJECTION 76% COMPARISON:  CT HEAD May 10, 2018. FINDINGS: CTA NECK FINDINGS: AORTIC ARCH: Normal appearance of the thoracic arch, normal branch pattern. Mild calcific atherosclerosis aortic arch. The origins of the innominate, left Common carotid artery and subclavian artery are widely patent. RIGHT CAROTID SYSTEM: Common carotid artery is patent. Mild calcific atherosclerosis of the carotid bifurcation without hemodynamically significant stenosis by NASCET criteria. Normal appearance of the internal carotid artery. LEFT CAROTID SYSTEM: Common carotid artery is patent. Mild calcific atherosclerosis of the carotid bifurcation without hemodynamically significant stenosis by NASCET criteria. Normal appearance of the internal carotid artery. VERTEBRAL ARTERIES:Left  vertebral artery is dominant. Normal appearance of the vertebral arteries, widely patent. SKELETON: No acute osseous process though bone windows have not been submitted. Moderate degenerative change of the cervical spine. OTHER NECK: Soft tissues of the neck are nonacute though, not tailored for evaluation. UPPER CHEST: Included lung apices are clear. No superior mediastinal lymphadenopathy. CTA HEAD FINDINGS: ANTERIOR CIRCULATION: Patent cervical internal carotid arteries, petrous, cavernous and supra clinoid internal carotid arteries. Patent anterior communicating artery. Patent anterior and middle cerebral arteries. No large vessel occlusion, significant stenosis, contrast extravasation or aneurysm. POSTERIOR CIRCULATION: Patent vertebral arteries, vertebrobasilar junction and basilar artery, as well as main branch vessels. Patent posterior cerebral arteries. Bilateral posterior communicating arteries present, RIGHT PCOM infundibulum. Fetal origin RIGHT PCA. Severe stenosis RIGHT P2 segment. Moderate luminal irregularity posterior cerebral arteries. No large vessel occlusion, contrast extravasation or aneurysm. VENOUS SINUSES: Major dural venous sinuses are patent though not tailored for evaluation on this angiographic examination. ANATOMIC VARIANTS: None. DELAYED PHASE: No abnormal intracranial enhancement. MIP images reviewed. IMPRESSION: CTA NECK: 1. No hemodynamically significant stenosis ICA's. Patent vertebral arteries. CTA HEAD: 1. No emergent large vessel occlusion. 2. Atherosclerosis disproportionately affecting posterior cerebral arteries; severe stenosis RIGHT P2 segment. Acute findings discussed with and reconfirmed by Dr.JULIE HAVILAND on 05/10/2018 at 7:04 pm. Aortic Atherosclerosis (ICD10-I70.0). Electronically Signed   By: Elon Alas M.D.   On: 05/10/2018 19:05   Ct Angio Neck W And/or Wo Contrast  Result Date: 05/10/2018 CLINICAL DATA:  Disoriented, confusion leaving work today.  History of seizures, hypertension, hypercholesterolemia and diabetes. EXAM: CT ANGIOGRAPHY HEAD AND NECK TECHNIQUE: Multidetector CT imaging of the head and neck was performed using the standard protocol during bolus administration of intravenous contrast. Multiplanar CT image reconstructions and MIPs were obtained to evaluate the vascular anatomy. Carotid stenosis measurements (when applicable) are obtained utilizing NASCET criteria, using the distal internal carotid diameter as the denominator. CONTRAST:  32mL ISOVUE-370 IOPAMIDOL (ISOVUE-370) INJECTION 76% COMPARISON:  CT HEAD May 10, 2018. FINDINGS: CTA NECK FINDINGS: AORTIC ARCH: Normal appearance of the thoracic arch, normal branch pattern. Mild calcific atherosclerosis aortic arch. The origins of the innominate, left Common carotid artery and subclavian artery are widely patent. RIGHT CAROTID SYSTEM: Common carotid artery is patent. Mild calcific atherosclerosis of the carotid bifurcation without hemodynamically significant stenosis by NASCET criteria. Normal appearance of the internal carotid artery. LEFT CAROTID SYSTEM: Common carotid artery is patent. Mild calcific atherosclerosis of the carotid bifurcation without hemodynamically significant stenosis by NASCET criteria. Normal appearance of the internal carotid artery. VERTEBRAL ARTERIES:Left vertebral artery is dominant. Normal appearance of the  vertebral arteries, widely patent. SKELETON: No acute osseous process though bone windows have not been submitted. Moderate degenerative change of the cervical spine. OTHER NECK: Soft tissues of the neck are nonacute though, not tailored for evaluation. UPPER CHEST: Included lung apices are clear. No superior mediastinal lymphadenopathy. CTA HEAD FINDINGS: ANTERIOR CIRCULATION: Patent cervical internal carotid arteries, petrous, cavernous and supra clinoid internal carotid arteries. Patent anterior communicating artery. Patent anterior and middle cerebral  arteries. No large vessel occlusion, significant stenosis, contrast extravasation or aneurysm. POSTERIOR CIRCULATION: Patent vertebral arteries, vertebrobasilar junction and basilar artery, as well as main branch vessels. Patent posterior cerebral arteries. Bilateral posterior communicating arteries present, RIGHT PCOM infundibulum. Fetal origin RIGHT PCA. Severe stenosis RIGHT P2 segment. Moderate luminal irregularity posterior cerebral arteries. No large vessel occlusion, contrast extravasation or aneurysm. VENOUS SINUSES: Major dural venous sinuses are patent though not tailored for evaluation on this angiographic examination. ANATOMIC VARIANTS: None. DELAYED PHASE: No abnormal intracranial enhancement. MIP images reviewed. IMPRESSION: CTA NECK: 1. No hemodynamically significant stenosis ICA's. Patent vertebral arteries. CTA HEAD: 1. No emergent large vessel occlusion. 2. Atherosclerosis disproportionately affecting posterior cerebral arteries; severe stenosis RIGHT P2 segment. Acute findings discussed with and reconfirmed by Dr.JULIE HAVILAND on 05/10/2018 at 7:04 pm. Aortic Atherosclerosis (ICD10-I70.0). Electronically Signed   By: Elon Alas M.D.   On: 05/10/2018 19:05   Mr Brain Wo Contrast  Result Date: 05/11/2018 CLINICAL DATA:  72 y/o F; disorientation, confusion, possible TIA, initial exam. EXAM: MRI HEAD WITHOUT CONTRAST MRA HEAD WITHOUT CONTRAST TECHNIQUE: Multiplanar, multiecho pulse sequences of the brain and surrounding structures were obtained without intravenous contrast. Angiographic images of the head were obtained using MRA technique without contrast. COMPARISON:  05/10/2018 CT head and CTA head.  04/17/2016 MRI head. FINDINGS: MRI HEAD FINDINGS Brain: Small foci of reduced diffusion are present within the left splenium of corpus callosum and left posterior hippocampus as well as a small focus within the left occipital lobe (series 5, image 49-54). No associated hemorrhage or mass  effect. Scattered nonspecific T2 FLAIR hyperintensities in subcortical and periventricular white matter as well as the pons are compatible with mild chronic microvascular ischemic changes for age. Mild volume loss of the brain. No extra-axial collection, hydrocephalus, or herniation. Stable bilobed left paramedian inferior frontal region mass with fat signal measuring 23 x 16 mm (AP by ML series 17, image 11). Vascular: As below. Skull and upper cervical spine: Normal marrow signal. Sinuses/Orbits: Left mastoid opacification. Mild paranasal sinus mucosal thickening. Bilateral intra-ocular lens replacement. Other: None. MRA HEAD FINDINGS Internal carotid arteries: Patent. Mild non stenotic irregularity of carotid siphons compatible with atherosclerotic changes. Anterior cerebral arteries:  Patent. Middle cerebral arteries: Patent. Anterior communicating artery: Patent. Posterior communicating arteries: Fetal right PCA. Patent left posterior communicating artery. Posterior cerebral arteries: Patent right. No flow related signal within the left P4 parieto-occipital branch. Mild left P2 stenosis. Moderate to severe right P2 stenosis. Basilar artery: Patent. Short segment of mild upper basilar stenosis. Vertebral arteries: Patent. Short segment of distal left vertebral artery stenosis. No evidence of high-grade stenosis or aneurysm. IMPRESSION: MRI head: 1. Small foci of reduced diffusion present within splenium, left posterior hippocampus, and left occipital lobe compatible with acute/early subacute infarction. No associated hemorrhage or mass effect. 2. Mild chronic microvascular ischemic changes and volume loss of the brain. 3. 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 MRA head: 1. No flow related signal within left P4 parieto-occipital branch which may represent occlusion or  high-grade stenosis. 2. Moderate to severe right P2 stenosis. 3. No additional vessel occlusion, high-grade  stenosis, or aneurysm. These results will be called to the ordering clinician or representative by the Radiologist Assistant, and communication documented in the PACS or zVision Dashboard. Electronically Signed   By: Kristine Garbe M.D.   On: 05/11/2018 04:54   Dg Chest Portable 1 View  Result Date: 05/10/2018 CLINICAL DATA:  Headache and confusion since noon. EXAM: PORTABLE CHEST 1 VIEW COMPARISON:  06/15/2016 FINDINGS: Top-normal heart size with aortic atherosclerosis. Slightly low lung volumes without acute pulmonary consolidation nor overt pulmonary edema. No effusion or pneumothorax. Osteoarthritis of the AC joints bilaterally. IMPRESSION: Borderline cardiomegaly with aortic atherosclerosis. No active pulmonary disease. Electronically Signed   By: Ashley Royalty M.D.   On: 05/10/2018 18:54   Mr Jodene Nam Head Wo Contrast  Result Date: 05/11/2018 CLINICAL DATA:  72 y/o F; disorientation, confusion, possible TIA, initial exam. EXAM: MRI HEAD WITHOUT CONTRAST MRA HEAD WITHOUT CONTRAST TECHNIQUE: Multiplanar, multiecho pulse sequences of the brain and surrounding structures were obtained without intravenous contrast. Angiographic images of the head were obtained using MRA technique without contrast. COMPARISON:  05/10/2018 CT head and CTA head.  04/17/2016 MRI head. FINDINGS: MRI HEAD FINDINGS Brain: Small foci of reduced diffusion are present within the left splenium of corpus callosum and left posterior hippocampus as well as a small focus within the left occipital lobe (series 5, image 49-54). No associated hemorrhage or mass effect. Scattered nonspecific T2 FLAIR hyperintensities in subcortical and periventricular white matter as well as the pons are compatible with mild chronic microvascular ischemic changes for age. Mild volume loss of the brain. No extra-axial collection, hydrocephalus, or herniation. Stable bilobed left paramedian inferior frontal region mass with fat signal measuring 23 x 16 mm  (AP by ML series 17, image 11). Vascular: As below. Skull and upper cervical spine: Normal marrow signal. Sinuses/Orbits: Left mastoid opacification. Mild paranasal sinus mucosal thickening. Bilateral intra-ocular lens replacement. Other: None. MRA HEAD FINDINGS Internal carotid arteries: Patent. Mild non stenotic irregularity of carotid siphons compatible with atherosclerotic changes. Anterior cerebral arteries:  Patent. Middle cerebral arteries: Patent. Anterior communicating artery: Patent. Posterior communicating arteries: Fetal right PCA. Patent left posterior communicating artery. Posterior cerebral arteries: Patent right. No flow related signal within the left P4 parieto-occipital branch. Mild left P2 stenosis. Moderate to severe right P2 stenosis. Basilar artery: Patent. Short segment of mild upper basilar stenosis. Vertebral arteries: Patent. Short segment of distal left vertebral artery stenosis. No evidence of high-grade stenosis or aneurysm. IMPRESSION: MRI head: 1. Small foci of reduced diffusion present within splenium, left posterior hippocampus, and left occipital lobe compatible with acute/early subacute infarction. No associated hemorrhage or mass effect. 2. Mild chronic microvascular ischemic changes and volume loss of the brain. 3. 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 MRA head: 1. No flow related signal within left P4 parieto-occipital branch which may represent occlusion or high-grade stenosis. 2. Moderate to severe right P2 stenosis. 3. No additional vessel occlusion, high-grade stenosis, or aneurysm. These results will be called to the ordering clinician or representative by the Radiologist Assistant, and communication documented in the PACS or zVision Dashboard. Electronically Signed   By: Kristine Garbe M.D.   On: 05/11/2018 04:54   Ct Head Code Stroke Wo Contrast  Result Date: 05/10/2018 CLINICAL DATA:  Code stroke. Confusion while  leaving work this afternoon. History of seizures, hypertension, hypercholesterolemia and diabetes. EXAM: CT HEAD WITHOUT CONTRAST  TECHNIQUE: Contiguous axial images were obtained from the base of the skull through the vertex without intravenous contrast. COMPARISON:  MRI head April 17, 2016 and CT HEAD April 15, 2016. FINDINGS: BRAIN: No intraparenchymal hemorrhage nor midline shift. The ventricles and sulci are normal for age. Patchy supratentorial white matter hypodensities less than expected for patient's age, though non-specific are most compatible with chronic small vessel ischemic disease. Lobulated LEFT inferior frontal lobe fat calcified mass is unchanged, no significant mass effect. No acute large vascular territory infarcts. No abnormal extra-axial fluid collections. Basal cisterns are patent. VASCULAR: Moderate calcific atherosclerosis of the carotid siphons. SKULL: No skull fracture. No significant scalp soft tissue swelling. SINUSES/ORBITS: Trace paranasal sinus mucosal thickening. LEFT mastoid effusion. Included ocular globes and orbital contents are non-suspicious. Status post bilateral ocular lens implants. OTHER: None. ASPECTS [facility] Stroke Program Early CT Score) - Ganglionic level infarction (caudate, lentiform nuclei, internal capsule, insula, M1-M3 cortex): 7 - Supraganglionic infarction (M4-M6 cortex): 3 Total score (0-10 with 10 being normal): 10 IMPRESSION: 1. No acute intracranial process. 2. ASPECTS is 10. 3. 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 4. Critical Value/emergent results were called by telephone at the time of interpretation on 05/10/2018 at 6:33 pm to Dr. Isla Pence , who verbally acknowledged these results. Electronically Signed   By: Elon Alas M.D.   On: 05/10/2018 18:34   Vas US Carotid (at [place] Only)  Result Date: 05/11/2018 Carotid Arterial Duplex Study Indications:   TIA. Risk Factors: Hypertension, hyperlipidemia, Diabetes. Performing Technologist: Maudry Mayhew MHA, RDMS, RVT, RDCS  Examination Guidelines: A complete evaluation includes B-mode imaging, spectral Doppler, color Doppler, and power Doppler as needed of all accessible portions of each vessel. Bilateral testing is considered an integral part of a complete examination. Limited examinations for reoccurring indications may be performed as noted.  Right Carotid Findings: +----------+--------+-------+--------+--------------------------------+--------+           PSV cm/sEDV    StenosisDescribe                        Comments                   cm/s                                                    +----------+--------+-------+--------+--------------------------------+--------+ CCA Prox  101     21                                                      +----------+--------+-------+--------+--------------------------------+--------+ CCA Distal78      17             smooth, heterogenous and                                                  calcific                                 +----------+--------+-------+--------+--------------------------------+--------+  ICA Prox  82      20             smooth, heterogenous and                                                  calcific                                 +----------+--------+-------+--------+--------------------------------+--------+ ICA Distal52      14                                                      +----------+--------+-------+--------+--------------------------------+--------+ ECA       99      6                                                       +----------+--------+-------+--------+--------------------------------+--------+ +----------+--------+-------+----------------+-------------------+           PSV cm/sEDV cmsDescribe        Arm Pressure (mmHG)  +----------+--------+-------+----------------+-------------------+ [id number]            Multiphasic, WNL                    +----------+--------+-------+----------------+-------------------+ +---------+--------+--+--------+--+---------+ VertebralPSV cm/s49EDV cm/s11Antegrade +---------+--------+--+--------+--+---------+  Left Carotid Findings: +----------+--------+--------+--------+---------------------+------------------+           PSV cm/sEDV cm/sStenosisDescribe             Comments           +----------+--------+--------+--------+---------------------+------------------+ CCA Prox  91      13                                                      +----------+--------+--------+--------+---------------------+------------------+ CCA Distal96      21                                   intimal thickening +----------+--------+--------+--------+---------------------+------------------+ ICA Prox  66      17              smooth, heterogenous                                                      and calcific                            +----------+--------+--------+--------+---------------------+------------------+ ICA Distal104     32                                                      +----------+--------+--------+--------+---------------------+------------------+  ECA       103     11                                                      +----------+--------+--------+--------+---------------------+------------------+ +----------+--------+--------+----------------+-------------------+ SubclavianPSV cm/sEDV cm/sDescribe        Arm Pressure (mmHG) +----------+--------+--------+----------------+-------------------+           113             Multiphasic, WNL                    +----------+--------+--------+----------------+-------------------+ +---------+--------+---+--------+--+---------+ VertebralPSV cm/s114EDV cm/s11Antegrade  +---------+--------+---+--------+--+---------+  Summary: Right Carotid: Velocities in the right ICA are consistent with a 1-39% stenosis. Left Carotid: Velocities in the left ICA are consistent with a 1-39% stenosis. Vertebrals:  Bilateral vertebral arteries demonstrate antegrade flow. Subclavians: Normal flow hemodynamics were seen in bilateral subclavian              arteries. *See table(s) above for measurements and observations.  Electronically signed by Antony Contras MD on 05/11/2018 at 2:52:19 PM.    Final        Subjective: No chest pain. Or sob. No new blurry vision.  No weakness or sensory deficits. No headache.   Discharge Exam: Vitals:   05/12/18 1440 05/12/18 1621  BP: (!) 171/77 (!) 158/65  Pulse: 67 62  Resp: 20 20  Temp: 98.5 F (36.9 C) 98.9 F (37.2 C)  SpO2: 96% 96%   Vitals:   05/12/18 1307 05/12/18 1328 05/12/18 1440 05/12/18 1621  BP: (!) 215/73 (!) 188/75 (!) 171/77 (!) 158/65  Pulse: 65 60 67 62  Resp: (!) 23 20 20 20   Temp:  98.8 F (37.1 C) 98.5 F (36.9 C) 98.9 F (37.2 C)  TempSrc:  Oral Oral Oral  SpO2: 95% 99% 96% 96%  Weight:      Height:        General: Pt is alert, awake, not in acute distress Cardiovascular: RRR, S1/S2 +, no rubs, no gallops Respiratory: CTA bilaterally, no wheezing, no rhonchi Abdominal: Soft, NT, ND, bowel sounds + Extremities: no edema, no cyanosis    The results of significant diagnostics from this hospitalization (including imaging, microbiology, ancillary and laboratory) are listed below for reference.     Microbiology: No results found for this or any previous visit (from the past 240 hour(s)).   Labs: BNP (last 3 results) No results for input(s): BNP in the last 8760 hours. Basic Metabolic Panel: Recent Labs  Lab 05/10/18 1908 05/11/18 0614  NA 136  --   K 3.6  --   CL 103  --   CO2 24  --   GLUCOSE 186*  --   BUN 22  --   CREATININE 1.35* 1.13*  CALCIUM 9.1  --    Liver Function Tests: Recent  Labs  Lab 05/10/18 1908  AST 16  ALT 13  ALKPHOS 89  BILITOT 0.5  PROT 6.6  ALBUMIN 3.4*   No results for input(s): LIPASE, AMYLASE in the last 168 hours. No results for input(s): AMMONIA in the last 168 hours. CBC: Recent Labs  Lab 05/10/18 1908 05/11/18 0614  WBC 13.9* 10.4  NEUTROABS 10.7*  --   HGB 10.6* 9.8*  HCT 32.9* 32.7*  MCV 91.4 94.2  PLT 288 288   Cardiac Enzymes:  Recent Labs  Lab 05/10/18 1815 05/11/18 0614  CKTOTAL  --  43  TROPONINI <0.03  --    BNP: Invalid input(s): POCBNP CBG: Recent Labs  Lab 05/11/18 2341 05/12/18 0410 05/12/18 1011 05/12/18 1157 05/12/18 1458  GLUCAP 172* 159* 111* 93 104*   D-Dimer No results for input(s): DDIMER in the last 72 hours. Hgb A1c Recent Labs    05/11/18 0614  HGBA1C 8.7*   Lipid Profile Recent Labs    05/11/18 0614  CHOL 165  HDL 44  LDLCALC 95  TRIG 130  CHOLHDL 3.8   Thyroid function studies No results for input(s): TSH, T4TOTAL, T3FREE, THYROIDAB in the last 72 hours.  Invalid input(s): FREET3 Anemia work up No results for input(s): VITAMINB12, FOLATE, FERRITIN, TIBC, IRON, RETICCTPCT in the last 72 hours. Urinalysis    Component Value Date/Time   COLORURINE YELLOW 05/10/2018 1815   APPEARANCEUR CLEAR 05/10/2018 1815   LABSPEC 1.015 05/10/2018 1815   PHURINE 7.0 05/10/2018 1815   GLUCOSEU NEGATIVE 05/10/2018 1815   HGBUR NEGATIVE 05/10/2018 1815   BILIRUBINUR NEGATIVE 05/10/2018 1815   KETONESUR NEGATIVE 05/10/2018 1815   PROTEINUR NEGATIVE 05/10/2018 1815   UROBILINOGEN 1.0 05/28/2015 0122   NITRITE NEGATIVE 05/10/2018 1815   LEUKOCYTESUR NEGATIVE 05/10/2018 1815   Sepsis Labs Invalid input(s): PROCALCITONIN,  WBC,  LACTICIDVEN Microbiology No results found for this or any previous visit (from the past 240 hour(s)).   Time coordinating discharge: 32 minutes  SIGNED:   Hosie Poisson, MD  [facility] 05/12/2018, 11:21 PM Pager   If 7PM-7AM, please contact  night-coverage [email or web address] Password [email or web address]

## 2018-05-12 NOTE — Progress Notes (Signed)
STROKE TEAM PROGRESS NOTE      INTERVAL HISTORY Her RN is at the bedside.   She states she is doing well. She has no complaints. TEE done today shows no PFO or cardiac source of embolism. Loop recorder has been inserted. Patient is interested in participating in the [place] trial for stroke prevention  Vitals:   05/12/18 1305 05/12/18 1307 05/12/18 1328 05/12/18 1440  BP: (!) 220/108 (!) 215/73 (!) 188/75 (!) 171/77  Pulse: 64 65 60 67  Resp: (!) 23 (!) 23 20 20   Temp:   98.8 F (37.1 C) 98.5 F (36.9 C)  TempSrc:   Oral Oral  SpO2: 96% 95% 99% 96%  Weight:      Height:        CBC:  Recent Labs  Lab 05/10/18 1908 05/11/18 0614  WBC 13.9* 10.4  NEUTROABS 10.7*  --   HGB 10.6* 9.8*  HCT 32.9* 32.7*  MCV 91.4 94.2  PLT 288 818    Basic Metabolic Panel:  Recent Labs  Lab 05/10/18 1908 05/11/18 0614  NA 136  --   K 3.6  --   CL 103  --   CO2 24  --   GLUCOSE 186*  --   BUN 22  --   CREATININE 1.35* 1.13*  CALCIUM 9.1  --    Lipid Panel:     Component Value Date/Time   CHOL 165 05/11/2018 0614   TRIG 130 05/11/2018 0614   HDL 44 05/11/2018 0614   CHOLHDL 3.8 05/11/2018 0614   VLDL 26 05/11/2018 0614   LDLCALC 95 05/11/2018 0614   HgbA1c:  Lab Results  Component Value Date   HGBA1C 8.7 (H) 05/11/2018   Urine Drug Screen:     Component Value Date/Time   LABOPIA POSITIVE (A) 05/10/2018 1815   COCAINSCRNUR NONE DETECTED 05/10/2018 1815   LABBENZ POSITIVE (A) 05/10/2018 1815   AMPHETMU NONE DETECTED 05/10/2018 1815   THCU NONE DETECTED 05/10/2018 1815   LABBARB NONE DETECTED 05/10/2018 1815    Alcohol Level     Component Value Date/Time   ETH <10 05/10/2018 1908    IMAGING Ct Angio Head W Or Wo Contrast  Result Date: 05/10/2018 CLINICAL DATA:  Disoriented, confusion leaving work today. History of seizures, hypertension, hypercholesterolemia and diabetes. EXAM: CT ANGIOGRAPHY HEAD AND NECK TECHNIQUE: Multidetector CT imaging of the head and neck  was performed using the standard protocol during bolus administration of intravenous contrast. Multiplanar CT image reconstructions and MIPs were obtained to evaluate the vascular anatomy. Carotid stenosis measurements (when applicable) are obtained utilizing NASCET criteria, using the distal internal carotid diameter as the denominator. CONTRAST:  53mL ISOVUE-370 IOPAMIDOL (ISOVUE-370) INJECTION 76% COMPARISON:  CT HEAD May 10, 2018. FINDINGS: CTA NECK FINDINGS: AORTIC ARCH: Normal appearance of the thoracic arch, normal branch pattern. Mild calcific atherosclerosis aortic arch. The origins of the innominate, left Common carotid artery and subclavian artery are widely patent. RIGHT CAROTID SYSTEM: Common carotid artery is patent. Mild calcific atherosclerosis of the carotid bifurcation without hemodynamically significant stenosis by NASCET criteria. Normal appearance of the internal carotid artery. LEFT CAROTID SYSTEM: Common carotid artery is patent. Mild calcific atherosclerosis of the carotid bifurcation without hemodynamically significant stenosis by NASCET criteria. Normal appearance of the internal carotid artery. VERTEBRAL ARTERIES:Left vertebral artery is dominant. Normal appearance of the vertebral arteries, widely patent. SKELETON: No acute osseous process though bone windows have not been submitted. Moderate degenerative change of the cervical spine. OTHER NECK: Soft tissues of  the neck are nonacute though, not tailored for evaluation. UPPER CHEST: Included lung apices are clear. No superior mediastinal lymphadenopathy. CTA HEAD FINDINGS: ANTERIOR CIRCULATION: Patent cervical internal carotid arteries, petrous, cavernous and supra clinoid internal carotid arteries. Patent anterior communicating artery. Patent anterior and middle cerebral arteries. No large vessel occlusion, significant stenosis, contrast extravasation or aneurysm. POSTERIOR CIRCULATION: Patent vertebral arteries, vertebrobasilar  junction and basilar artery, as well as main branch vessels. Patent posterior cerebral arteries. Bilateral posterior communicating arteries present, RIGHT PCOM infundibulum. Fetal origin RIGHT PCA. Severe stenosis RIGHT P2 segment. Moderate luminal irregularity posterior cerebral arteries. No large vessel occlusion, contrast extravasation or aneurysm. VENOUS SINUSES: Major dural venous sinuses are patent though not tailored for evaluation on this angiographic examination. ANATOMIC VARIANTS: None. DELAYED PHASE: No abnormal intracranial enhancement. MIP images reviewed. IMPRESSION: CTA NECK: 1. No hemodynamically significant stenosis ICA's. Patent vertebral arteries. CTA HEAD: 1. No emergent large vessel occlusion. 2. Atherosclerosis disproportionately affecting posterior cerebral arteries; severe stenosis RIGHT P2 segment. Acute findings discussed with and reconfirmed by Dr.JULIE HAVILAND on 05/10/2018 at 7:04 pm. Aortic Atherosclerosis (ICD10-I70.0). Electronically Signed   By: Elon Alas M.D.   On: 05/10/2018 19:05   Ct Angio Neck W And/or Wo Contrast  Result Date: 05/10/2018 CLINICAL DATA:  Disoriented, confusion leaving work today. History of seizures, hypertension, hypercholesterolemia and diabetes. EXAM: CT ANGIOGRAPHY HEAD AND NECK TECHNIQUE: Multidetector CT imaging of the head and neck was performed using the standard protocol during bolus administration of intravenous contrast. Multiplanar CT image reconstructions and MIPs were obtained to evaluate the vascular anatomy. Carotid stenosis measurements (when applicable) are obtained utilizing NASCET criteria, using the distal internal carotid diameter as the denominator. CONTRAST:  24mL ISOVUE-370 IOPAMIDOL (ISOVUE-370) INJECTION 76% COMPARISON:  CT HEAD May 10, 2018. FINDINGS: CTA NECK FINDINGS: AORTIC ARCH: Normal appearance of the thoracic arch, normal branch pattern. Mild calcific atherosclerosis aortic arch. The origins of the innominate,  left Common carotid artery and subclavian artery are widely patent. RIGHT CAROTID SYSTEM: Common carotid artery is patent. Mild calcific atherosclerosis of the carotid bifurcation without hemodynamically significant stenosis by NASCET criteria. Normal appearance of the internal carotid artery. LEFT CAROTID SYSTEM: Common carotid artery is patent. Mild calcific atherosclerosis of the carotid bifurcation without hemodynamically significant stenosis by NASCET criteria. Normal appearance of the internal carotid artery. VERTEBRAL ARTERIES:Left vertebral artery is dominant. Normal appearance of the vertebral arteries, widely patent. SKELETON: No acute osseous process though bone windows have not been submitted. Moderate degenerative change of the cervical spine. OTHER NECK: Soft tissues of the neck are nonacute though, not tailored for evaluation. UPPER CHEST: Included lung apices are clear. No superior mediastinal lymphadenopathy. CTA HEAD FINDINGS: ANTERIOR CIRCULATION: Patent cervical internal carotid arteries, petrous, cavernous and supra clinoid internal carotid arteries. Patent anterior communicating artery. Patent anterior and middle cerebral arteries. No large vessel occlusion, significant stenosis, contrast extravasation or aneurysm. POSTERIOR CIRCULATION: Patent vertebral arteries, vertebrobasilar junction and basilar artery, as well as main branch vessels. Patent posterior cerebral arteries. Bilateral posterior communicating arteries present, RIGHT PCOM infundibulum. Fetal origin RIGHT PCA. Severe stenosis RIGHT P2 segment. Moderate luminal irregularity posterior cerebral arteries. No large vessel occlusion, contrast extravasation or aneurysm. VENOUS SINUSES: Major dural venous sinuses are patent though not tailored for evaluation on this angiographic examination. ANATOMIC VARIANTS: None. DELAYED PHASE: No abnormal intracranial enhancement. MIP images reviewed. IMPRESSION: CTA NECK: 1. No hemodynamically  significant stenosis ICA's. Patent vertebral arteries. CTA HEAD: 1. No emergent large vessel occlusion. 2. Atherosclerosis disproportionately affecting posterior  cerebral arteries; severe stenosis RIGHT P2 segment. Acute findings discussed with and reconfirmed by Dr.JULIE HAVILAND on 05/10/2018 at 7:04 pm. Aortic Atherosclerosis (ICD10-I70.0). Electronically Signed   By: Elon Alas M.D.   On: 05/10/2018 19:05   Mr Brain Wo Contrast  Result Date: 05/11/2018 CLINICAL DATA:  72 y/o F; disorientation, confusion, possible TIA, initial exam. EXAM: MRI HEAD WITHOUT CONTRAST MRA HEAD WITHOUT CONTRAST TECHNIQUE: Multiplanar, multiecho pulse sequences of the brain and surrounding structures were obtained without intravenous contrast. Angiographic images of the head were obtained using MRA technique without contrast. COMPARISON:  05/10/2018 CT head and CTA head.  04/17/2016 MRI head. FINDINGS: MRI HEAD FINDINGS Brain: Small foci of reduced diffusion are present within the left splenium of corpus callosum and left posterior hippocampus as well as a small focus within the left occipital lobe (series 5, image 49-54). No associated hemorrhage or mass effect. Scattered nonspecific T2 FLAIR hyperintensities in subcortical and periventricular white matter as well as the pons are compatible with mild chronic microvascular ischemic changes for age. Mild volume loss of the brain. No extra-axial collection, hydrocephalus, or herniation. Stable bilobed left paramedian inferior frontal region mass with fat signal measuring 23 x 16 mm (AP by ML series 17, image 11). Vascular: As below. Skull and upper cervical spine: Normal marrow signal. Sinuses/Orbits: Left mastoid opacification. Mild paranasal sinus mucosal thickening. Bilateral intra-ocular lens replacement. Other: None. MRA HEAD FINDINGS Internal carotid arteries: Patent. Mild non stenotic irregularity of carotid siphons compatible with atherosclerotic changes. Anterior  cerebral arteries:  Patent. Middle cerebral arteries: Patent. Anterior communicating artery: Patent. Posterior communicating arteries: Fetal right PCA. Patent left posterior communicating artery. Posterior cerebral arteries: Patent right. No flow related signal within the left P4 parieto-occipital branch. Mild left P2 stenosis. Moderate to severe right P2 stenosis. Basilar artery: Patent. Short segment of mild upper basilar stenosis. Vertebral arteries: Patent. Short segment of distal left vertebral artery stenosis. No evidence of high-grade stenosis or aneurysm. IMPRESSION: MRI head: 1. Small foci of reduced diffusion present within splenium, left posterior hippocampus, and left occipital lobe compatible with acute/early subacute infarction. No associated hemorrhage or mass effect. 2. Mild chronic microvascular ischemic changes and volume loss of the brain. 3. 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 MRA head: 1. No flow related signal within left P4 parieto-occipital branch which may represent occlusion or high-grade stenosis. 2. Moderate to severe right P2 stenosis. 3. No additional vessel occlusion, high-grade stenosis, or aneurysm. These results will be called to the ordering clinician or representative by the Radiologist Assistant, and communication documented in the PACS or zVision Dashboard. Electronically Signed   By: Kristine Garbe M.D.   On: 05/11/2018 04:54   Dg Chest Portable 1 View  Result Date: 05/10/2018 CLINICAL DATA:  Headache and confusion since noon. EXAM: PORTABLE CHEST 1 VIEW COMPARISON:  06/15/2016 FINDINGS: Top-normal heart size with aortic atherosclerosis. Slightly low lung volumes without acute pulmonary consolidation nor overt pulmonary edema. No effusion or pneumothorax. Osteoarthritis of the AC joints bilaterally. IMPRESSION: Borderline cardiomegaly with aortic atherosclerosis. No active pulmonary disease. Electronically Signed   By:  Ashley Royalty M.D.   On: 05/10/2018 18:54   Mr Jodene Nam Head Wo Contrast  Result Date: 05/11/2018 CLINICAL DATA:  72 y/o F; disorientation, confusion, possible TIA, initial exam. EXAM: MRI HEAD WITHOUT CONTRAST MRA HEAD WITHOUT CONTRAST TECHNIQUE: Multiplanar, multiecho pulse sequences of the brain and surrounding structures were obtained without intravenous contrast. Angiographic images of the head were obtained  using MRA technique without contrast. COMPARISON:  05/10/2018 CT head and CTA head.  04/17/2016 MRI head. FINDINGS: MRI HEAD FINDINGS Brain: Small foci of reduced diffusion are present within the left splenium of corpus callosum and left posterior hippocampus as well as a small focus within the left occipital lobe (series 5, image 49-54). No associated hemorrhage or mass effect. Scattered nonspecific T2 FLAIR hyperintensities in subcortical and periventricular white matter as well as the pons are compatible with mild chronic microvascular ischemic changes for age. Mild volume loss of the brain. No extra-axial collection, hydrocephalus, or herniation. Stable bilobed left paramedian inferior frontal region mass with fat signal measuring 23 x 16 mm (AP by ML series 17, image 11). Vascular: As below. Skull and upper cervical spine: Normal marrow signal. Sinuses/Orbits: Left mastoid opacification. Mild paranasal sinus mucosal thickening. Bilateral intra-ocular lens replacement. Other: None. MRA HEAD FINDINGS Internal carotid arteries: Patent. Mild non stenotic irregularity of carotid siphons compatible with atherosclerotic changes. Anterior cerebral arteries:  Patent. Middle cerebral arteries: Patent. Anterior communicating artery: Patent. Posterior communicating arteries: Fetal right PCA. Patent left posterior communicating artery. Posterior cerebral arteries: Patent right. No flow related signal within the left P4 parieto-occipital branch. Mild left P2 stenosis. Moderate to severe right P2 stenosis. Basilar  artery: Patent. Short segment of mild upper basilar stenosis. Vertebral arteries: Patent. Short segment of distal left vertebral artery stenosis. No evidence of high-grade stenosis or aneurysm. IMPRESSION: MRI head: 1. Small foci of reduced diffusion present within splenium, left posterior hippocampus, and left occipital lobe compatible with acute/early subacute infarction. No associated hemorrhage or mass effect. 2. Mild chronic microvascular ischemic changes and volume loss of the brain. 3. 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 MRA head: 1. No flow related signal within left P4 parieto-occipital branch which may represent occlusion or high-grade stenosis. 2. Moderate to severe right P2 stenosis. 3. No additional vessel occlusion, high-grade stenosis, or aneurysm. These results will be called to the ordering clinician or representative by the Radiologist Assistant, and communication documented in the PACS or zVision Dashboard. Electronically Signed   By: Kristine Garbe M.D.   On: 05/11/2018 04:54   Ct Head Code Stroke Wo Contrast  Result Date: 05/10/2018 CLINICAL DATA:  Code stroke. Confusion while leaving work this afternoon. History of seizures, hypertension, hypercholesterolemia and diabetes. EXAM: CT HEAD WITHOUT CONTRAST TECHNIQUE: Contiguous axial images were obtained from the base of the skull through the vertex without intravenous contrast. COMPARISON:  MRI head April 17, 2016 and CT HEAD April 15, 2016. FINDINGS: BRAIN: No intraparenchymal hemorrhage nor midline shift. The ventricles and sulci are normal for age. Patchy supratentorial white matter hypodensities less than expected for patient's age, though non-specific are most compatible with chronic small vessel ischemic disease. Lobulated LEFT inferior frontal lobe fat calcified mass is unchanged, no significant mass effect. No acute large vascular territory infarcts. No abnormal extra-axial fluid  collections. Basal cisterns are patent. VASCULAR: Moderate calcific atherosclerosis of the carotid siphons. SKULL: No skull fracture. No significant scalp soft tissue swelling. SINUSES/ORBITS: Trace paranasal sinus mucosal thickening. LEFT mastoid effusion. Included ocular globes and orbital contents are non-suspicious. Status post bilateral ocular lens implants. OTHER: None. ASPECTS [facility] Stroke Program Early CT Score) - Ganglionic level infarction (caudate, lentiform nuclei, internal capsule, insula, M1-M3 cortex): 7 - Supraganglionic infarction (M4-M6 cortex): 3 Total score (0-10 with 10 being normal): 10 IMPRESSION: 1. No acute intracranial process. 2. ASPECTS is 10. 3. 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 4. Critical Value/emergent results were called by telephone at the time of interpretation on 05/10/2018 at 6:33 pm to Dr. Isla Pence , who verbally acknowledged these results. Electronically Signed   By: Elon Alas M.D.   On: 05/10/2018 18:34   Vas US Carotid (at [place] Only)  Result Date: 05/11/2018 Carotid Arterial Duplex Study Indications:  TIA. Risk Factors: Hypertension, hyperlipidemia, Diabetes. Performing Technologist: Maudry Mayhew MHA, RDMS, RVT, RDCS  Examination Guidelines: A complete evaluation includes B-mode imaging, spectral Doppler, color Doppler, and power Doppler as needed of all accessible portions of each vessel. Bilateral testing is considered an integral part of a complete examination. Limited examinations for reoccurring indications may be performed as noted.  Right Carotid Findings: +----------+--------+-------+--------+--------------------------------+--------+           PSV cm/sEDV    StenosisDescribe                        Comments                   cm/s                                                     +----------+--------+-------+--------+--------------------------------+--------+ CCA Prox  101     21                                                      +----------+--------+-------+--------+--------------------------------+--------+ CCA Distal78      17             smooth, heterogenous and                                                  calcific                                 +----------+--------+-------+--------+--------------------------------+--------+ ICA Prox  82      20             smooth, heterogenous and                                                  calcific                                 +----------+--------+-------+--------+--------------------------------+--------+ ICA Distal52      14                                                      +----------+--------+-------+--------+--------------------------------+--------+ ECA       99  6                                                       +----------+--------+-------+--------+--------------------------------+--------+ +----------+--------+-------+----------------+-------------------+           PSV cm/sEDV cmsDescribe        Arm Pressure (mmHG) +----------+--------+-------+----------------+-------------------+ [id number]            Multiphasic, WNL                    +----------+--------+-------+----------------+-------------------+ +---------+--------+--+--------+--+---------+ VertebralPSV cm/s49EDV cm/s11Antegrade +---------+--------+--+--------+--+---------+  Left Carotid Findings: +----------+--------+--------+--------+---------------------+------------------+           PSV cm/sEDV cm/sStenosisDescribe             Comments           +----------+--------+--------+--------+---------------------+------------------+ CCA Prox  91      13                                                       +----------+--------+--------+--------+---------------------+------------------+ CCA Distal96      21                                   intimal thickening +----------+--------+--------+--------+---------------------+------------------+ ICA Prox  66      17              smooth, heterogenous                                                      and calcific                            +----------+--------+--------+--------+---------------------+------------------+ ICA Distal104     32                                                      +----------+--------+--------+--------+---------------------+------------------+ ECA       103     11                                                      +----------+--------+--------+--------+---------------------+------------------+ +----------+--------+--------+----------------+-------------------+ SubclavianPSV cm/sEDV cm/sDescribe        Arm Pressure (mmHG) +----------+--------+--------+----------------+-------------------+           113             Multiphasic, WNL                    +----------+--------+--------+----------------+-------------------+ +---------+--------+---+--------+--+---------+ VertebralPSV cm/s114EDV cm/s11Antegrade +---------+--------+---+--------+--+---------+  Summary: Right Carotid: Velocities in the right ICA are consistent with a 1-39% stenosis. Left Carotid: Velocities in the left ICA are consistent with a 1-39%  stenosis. Vertebrals:  Bilateral vertebral arteries demonstrate antegrade flow. Subclavians: Normal flow hemodynamics were seen in bilateral subclavian              arteries. *See table(s) above for measurements and observations.  Electronically signed by Antony Contras MD on 05/11/2018 at 2:52:19 PM.    Final    2D echocardiogram - Left ventricle: The cavity size was normal. There was mild focal basal hypertrophy of the septum. Systolic function was normal. The estimated ejection fraction  was in the range of 60% to 65%. Wall motion was normal; there were no regional wall motion abnormalities. There was an increased relative contribution of atrial contraction to ventricular filling. Doppler parameters are consistent with abnormal left ventricular relaxation (grade 1 diastolic dysfunction). Doppler parameters are consistent with high ventricular filling pressure. - Aortic valve: There was mild stenosis. Mean gradient (S): 15 mm Hg. Valve area (VTI): 1.27 cm^2. Valve area (Vmax): 1.22 cm^2. Valve area (Vmean): 1.38 cm^2. - Mitral valve: Calcified annulus. Mildly calcified leaflets . - Pulmonary arteries: Systolic pressure could not be accurately estimated. - Inferior vena cava: The vessel was mildly dilated.  Carotid Doppler   There is 1-39% bilateral ICA stenosis. Vertebral artery flow is antegrade.   TEE no PFO or clot  PHYSICAL EXAM  pleasant elderly lady currently not in distress. . Afebrile. Head is nontraumatic. Neck is supple without bruit.    Cardiac exam no murmur or gallop. Lungs are clear to auscultation. Distal pulses are well felt. Neurological Exam :  Awake alert oriented 3 with normal speech and language function.diminished recall 1/3. Pupils equal reactive. Fundi not visualized. Extraocular moments are full range without nystagmus. Visual field testing shows partial right inferior quadrantanopsia. Face is symmetric. Tongue is midline. Motor system exam reveals symmetric upper and lower eczema distended without focal weakness. Deep tendon for this is symmetric. Sensation is intact. Gait not tested. NIHSS 1 ASSESSMENT/PLAN Ms. Fredrica Capano is a 72 y.o. female with history of morbid obesity, diabetes, diastolic CHF, hypertension, seizure due to [facility], presenting with confusion, speech difficulties and LLE weakness.   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 ASPECTS 10.     CTA head no ELVO.  Atherosclerosis posterior cerebral arteries with severe stenosis right P2  CTA neck no significant stenosis  MRI small left splenium infarct (left posterior hippocampus, left occipital).  Small vessel disease.  Atrophy.  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  MRA no flow related signal L P4.  Moderate to severe R P2 stenosis.  Carotid Doppler  B ICA 1-39% stenosis, VAs antegrade   2D Echo EF 60 to 65% with no source of embolus  TEE to look for embolic source. Arranged with [place] for tomorrow.  If positive for PFO (patent foramen ovale), check bilateral lower extremity venous dopplers to rule out DVT as possible source of stroke. (I have made patient NPO after midnight tonight).  If TEE negative, a [place] electrophysiologist will consult and consider placement of an implantable loop recorder to evaluate for atrial fibrillation as etiology of stroke. This has been explained to patient/family by Dr. Leonie Man and they are agreeable.  LDL 95  HgbA1c 8.7  HIV pending   Lovenox 40 mg sq daily for VTE prophylaxis  No antithrombotic prior to admission,  now on aspirin 325 mg daily. Given mild stroke, recommend aspirin 81 mg and plavix 75 mg daily x 3 weeks, then aspirin alone. Orders adjusted.   Therapy recommendations: No therapy needs  Disposition: Return home  Patient advised to not drive due to field cut identified during Dr. Clydene Fake exam  Needs field cut eval, recommend OP opthalmology followup to check optomteric visual fieldsassess further for driving  Palpitations  Past eval shows PACs  On metoprolol  ? AF as source os stroke  Hypertension  Stable . Permissive hypertension (OK if < 220/120) but gradually normalize in 5-7 days . Long-term BP goal normotensive  Hyperlipidemia  Home meds: Zocor 40  Resumed home  Zocor 40   intolerant to statin (crestor and Lipitor) in the past  LDL 95, goal < 70  Continue statin at discharge  Diabetes type II  HgbA1c 8.7, goal < 7.0  Uncontrolled  Other Stroke Risk Factors  Advanced age  ETOH use, advised to drink no more than 1 drink(s) a day  UDS positive for opiates and benzodiazepines   Morbid Obesity, Body mass index is 53.08 kg/m., recommend weight loss, diet and exercise as appropriate   Family hx stroke (mother and father)  Diastolic Congestive heart failure  Mild aortic stenosis  Other Active Problems  Hx seizures d/t [facility] day # 1    She presented with disorientation and memory difficulties along with some speech difficulties due to left PCA patchy infarct etiology to be determined. She also had incidental lipoma intracranially.  patient was not to drive till her visual fields improve.  Patient is interested in participating the [place] trial for stroke prevention and was given information to review and decide. Follow-up as an outpatient in the stroke clinic in 6 weeks. Stroke team will sign off. Antony Contras, MD Medical Director [facility] Pager: [phone] 05/12/2018 4:10 PM  To contact Stroke Continuity provider, please refer to [email or web address] After hours, contact General Neurology

## 2018-05-12 NOTE — Progress Notes (Signed)
  Echocardiogram 2D Echocardiogram has been performed.  Sharon [name] 05/12/2018, 12:48 PM

## 2018-05-12 NOTE — Interval H&P Note (Signed)
History and Physical Interval Note:  05/12/2018 2:03 PM  Sharon [name]  has presented today for surgery, with the diagnosis of stroke  The various methods of treatment have been discussed with the patient and family. After consideration of risks, benefits and other options for treatment, the patient has consented to  Procedure(s): LOOP RECORDER INSERTION (N/A) as a surgical intervention .  The patient's history has been reviewed, patient examined, no change in status, stable for surgery.  I have reviewed the patient's chart and labs.  Questions were answered to the patient's satisfaction.     Thompson Grayer

## 2018-05-12 NOTE — Progress Notes (Signed)
Pt returned from TEE

## 2018-05-13 ENCOUNTER — Encounter (HOSPITAL_COMMUNITY): Payer: Self-pay | Admitting: Internal Medicine

## 2018-05-17 NOTE — Consult Note (Signed)
            [facility] CM Primary Care Navigator  05/17/2018  Sharon [name] May 07, 1946 [id number]   Attempt to see patient at the bedsideto identify possible discharge needs butshe was already dischargedhome.   Per MD note, patient presented with complaints of difficulty with speech and left leg weakness and was admitted for further evaluation and management of TIA- transient ischemic attack/ stroke.  Primary care provider's officeis listed as providing transition of care (TOC) follow-up.  Patient hasdischarge instruction to follow-up with primary care provider in 1- 2 weeks, cardiology in 1 week, neurology in 4 weeks and follow up with ophthalmology for visual field defects.    Noted order for EMMI stroke calls already in place after discharge.   For additional questions please contact:  Sharon [name], BSN, RN-BC [facility] PRIMARY CARE Navigator Cell: [phone]

## 2018-05-26 ENCOUNTER — Ambulatory Visit: Payer: Medicare Other

## 2018-05-27 ENCOUNTER — Other Ambulatory Visit: Payer: Self-pay

## 2018-05-27 NOTE — Patient Outreach (Signed)
[place] [facility]) Care Management  05/27/2018  Sharon [name] November 07, 1945 [id number]  EMMI: stroke red alert Referral date: 05/27/18 Referral reason: feeling worse overall: yes,  Went to follow up appointment: NO  Insurance: Medicare Day # 13 Attempt #1  Transition of care will be completed by patient's primary care provider office who will refer to [place] Management if needed.   PLAN: RNCM will close patient due to patient being enrolled in an external program.   Quinn Plowman RN,BSN,CCM [facility] Telephonic  [phone]

## 2018-06-01 ENCOUNTER — Ambulatory Visit: Payer: Self-pay

## 2018-06-01 DIAGNOSIS — D509 Iron deficiency anemia, unspecified: Secondary | ICD-10-CM | POA: Diagnosis not present

## 2018-06-01 DIAGNOSIS — E7800 Pure hypercholesterolemia, unspecified: Secondary | ICD-10-CM | POA: Diagnosis not present

## 2018-06-01 DIAGNOSIS — I129 Hypertensive chronic kidney disease with stage 1 through stage 4 chronic kidney disease, or unspecified chronic kidney disease: Secondary | ICD-10-CM | POA: Diagnosis not present

## 2018-06-01 DIAGNOSIS — I639 Cerebral infarction, unspecified: Secondary | ICD-10-CM | POA: Diagnosis not present

## 2018-06-01 DIAGNOSIS — N183 Chronic kidney disease, stage 3 (moderate): Secondary | ICD-10-CM | POA: Diagnosis not present

## 2018-06-01 DIAGNOSIS — Z794 Long term (current) use of insulin: Secondary | ICD-10-CM | POA: Diagnosis not present

## 2018-06-01 DIAGNOSIS — E1169 Type 2 diabetes mellitus with other specified complication: Secondary | ICD-10-CM | POA: Diagnosis not present

## 2018-06-01 DIAGNOSIS — I110 Hypertensive heart disease with heart failure: Secondary | ICD-10-CM | POA: Diagnosis not present

## 2018-06-02 ENCOUNTER — Other Ambulatory Visit: Payer: Self-pay

## 2018-06-02 ENCOUNTER — Ambulatory Visit (INDEPENDENT_AMBULATORY_CARE_PROVIDER_SITE_OTHER): Payer: Medicare Other | Admitting: *Deleted

## 2018-06-02 DIAGNOSIS — I6330 Cerebral infarction due to thrombosis of unspecified cerebral artery: Secondary | ICD-10-CM

## 2018-06-02 LAB — CUP PACEART INCLINIC DEVICE CHECK
Date Time Interrogation Session: 20191120151831
MDC IDC PG IMPLANT DT: 20191030

## 2018-06-02 MED ORDER — CLOPIDOGREL BISULFATE 75 MG PO TABS: 75.0000 mg | ORAL_TABLET | Freq: Every day | ORAL | 0 refills | Status: DC

## 2018-06-02 NOTE — Patient Outreach (Signed)
[place] [facility]) Care Management  06/02/2018  Sharon [name] 1945-08-15 [id number]  EMMI: stroke red alert Referral date: 05/27/18 Referral reason: feeling worse overall: yes,  Went to follow up appointment: NO  Insurance: Medicare Day # 13 Attempt #2  Telephone call to patient regarding EMMI stroke red alert. Unable to reach. HIPAA compliant voice message left with call back phone number.   PLAN; RNCM will attempt 3rd telephone outreach to patient within 4 business days.   Quinn Plowman RN,BSN,CCM [facility] Telephonic  [phone]

## 2018-06-02 NOTE — Patient Instructions (Addendum)
Sharon [name] left a message for Dr. Clydene [name] office (neurologist) requesting that someone call you to schedule a post-hospital follow-up appointment. If you don't hear back by Friday, please contact Sharon [name] at [phone].  Ask at your neurology appointment about the research study and about refilling your Plavix.  Contact your ophthalmologist for follow-up as instructed.

## 2018-06-02 NOTE — Progress Notes (Signed)
Wound check appointment. Steri-strips removed prior to appointment by patient. Wound without redness or edema. Incision edges approximated, wound well healed. Normal device function. Battery status: good. R-waves 0.44mV. Pause and brady detection off at implant. No symptom or tachy episodes. 7 AF episodes--false, previously reviewed by JA, ECGs show SR w/PACs. Per JA, reprogrammed AF recording threshold to episodes >/= 37min. Patient educated about Carelink monitor. Monthly summary reports and ROV with JA PRN.  Contacted Dr. Clydene Fake office per patient request to attempt to setup post-hospital f/u per hospital d/c instructions. LMOVM for referral coordinator, gave patient contact information. 30 day refill for Plavix authorized by Dr. Rayann Heman as patient has not yet had neuro f/u.

## 2018-06-07 ENCOUNTER — Other Ambulatory Visit: Payer: Self-pay

## 2018-06-07 DIAGNOSIS — Z961 Presence of intraocular lens: Secondary | ICD-10-CM | POA: Diagnosis not present

## 2018-06-07 DIAGNOSIS — E119 Type 2 diabetes mellitus without complications: Secondary | ICD-10-CM | POA: Diagnosis not present

## 2018-06-07 DIAGNOSIS — H53461 Homonymous bilateral field defects, right side: Secondary | ICD-10-CM | POA: Diagnosis not present

## 2018-06-07 DIAGNOSIS — I63431 Cerebral infarction due to embolism of right posterior cerebral artery: Secondary | ICD-10-CM | POA: Diagnosis not present

## 2018-06-07 DIAGNOSIS — H353131 Nonexudative age-related macular degeneration, bilateral, early dry stage: Secondary | ICD-10-CM | POA: Diagnosis not present

## 2018-06-07 DIAGNOSIS — H26493 Other secondary cataract, bilateral: Secondary | ICD-10-CM | POA: Diagnosis not present

## 2018-06-07 NOTE — Patient Outreach (Signed)
[place] [facility]) Care Management  06/07/2018  Essica Kiker [birth year] [id number]   EMMI:stroke red alert Referral date:05/27/18 Referral reason:feeling worse overall: yes, Went to follow up appointment: NO  Insurance: Medicare Day #13 Attempt #3  Telephone call to patient regarding EMMI stroke red alert. Unable to reach. HIPAA compliant voice message left with call back phone number.   PLAN; If no return call will proceed with closure  Quinn Plowman RN,BSN,CCM [facility] Telephonic  [phone]

## 2018-06-14 ENCOUNTER — Ambulatory Visit (INDEPENDENT_AMBULATORY_CARE_PROVIDER_SITE_OTHER): Payer: Medicare Other

## 2018-06-14 ENCOUNTER — Other Ambulatory Visit: Payer: Self-pay

## 2018-06-14 DIAGNOSIS — I6330 Cerebral infarction due to thrombosis of unspecified cerebral artery: Secondary | ICD-10-CM | POA: Diagnosis not present

## 2018-06-14 NOTE — Patient Outreach (Signed)
[place] [facility]) Care Management  06/14/2018  Sharon [name] 1945-07-28 [id number]  EMMI:stroke red alert Referral date:05/27/18 Referral reason:feeling worse overall: yes, Went to follow up appointment: NO  Insurance: Medicare Day #13  No response after 3 telephone calls and outreach letter attempt.  PLAN: RNCM will close patient due to being unable to reach.  RNCM will send closure notification to patient's primary MD   Quinn Plowman RN,BSN,CCM [facility] Telephonic  [phone]

## 2018-06-15 NOTE — Progress Notes (Signed)
Carelink Summary Report / Loop Recorder 

## 2018-06-18 ENCOUNTER — Encounter (HOSPITAL_COMMUNITY): Payer: Self-pay | Admitting: Emergency Medicine

## 2018-06-18 ENCOUNTER — Emergency Department (HOSPITAL_COMMUNITY): Payer: Medicare Other

## 2018-06-18 ENCOUNTER — Emergency Department (HOSPITAL_COMMUNITY)
Admission: EM | Admit: 2018-06-18 | Discharge: 2018-06-19 | Disposition: A | Discharge status: Home / Self Care | Payer: Medicare Other | Attending: Emergency Medicine | Admitting: Emergency Medicine

## 2018-06-18 DIAGNOSIS — Z8673 Personal history of transient ischemic attack (TIA), and cerebral infarction without residual deficits: Secondary | ICD-10-CM | POA: Insufficient documentation

## 2018-06-18 DIAGNOSIS — Y939 Activity, unspecified: Secondary | ICD-10-CM | POA: Diagnosis not present

## 2018-06-18 DIAGNOSIS — W010XXA Fall on same level from slipping, tripping and stumbling without subsequent striking against object, initial encounter: Secondary | ICD-10-CM | POA: Diagnosis not present

## 2018-06-18 DIAGNOSIS — Y998 Other external cause status: Secondary | ICD-10-CM | POA: Diagnosis not present

## 2018-06-18 DIAGNOSIS — E7800 Pure hypercholesterolemia, unspecified: Secondary | ICD-10-CM | POA: Insufficient documentation

## 2018-06-18 DIAGNOSIS — W19XXXA Unspecified fall, initial encounter: Secondary | ICD-10-CM | POA: Diagnosis not present

## 2018-06-18 DIAGNOSIS — S6991XA Unspecified injury of right wrist, hand and finger(s), initial encounter: Secondary | ICD-10-CM | POA: Diagnosis not present

## 2018-06-18 DIAGNOSIS — Z7902 Long term (current) use of antithrombotics/antiplatelets: Secondary | ICD-10-CM | POA: Diagnosis not present

## 2018-06-18 DIAGNOSIS — Z79899 Other long term (current) drug therapy: Secondary | ICD-10-CM | POA: Insufficient documentation

## 2018-06-18 DIAGNOSIS — S0990XA Unspecified injury of head, initial encounter: Secondary | ICD-10-CM | POA: Diagnosis not present

## 2018-06-18 DIAGNOSIS — M25531 Pain in right wrist: Secondary | ICD-10-CM | POA: Diagnosis not present

## 2018-06-18 DIAGNOSIS — S8991XA Unspecified injury of right lower leg, initial encounter: Secondary | ICD-10-CM | POA: Diagnosis not present

## 2018-06-18 DIAGNOSIS — M79631 Pain in right forearm: Secondary | ICD-10-CM | POA: Diagnosis not present

## 2018-06-18 DIAGNOSIS — S59911A Unspecified injury of right forearm, initial encounter: Secondary | ICD-10-CM | POA: Diagnosis not present

## 2018-06-18 DIAGNOSIS — E119 Type 2 diabetes mellitus without complications: Secondary | ICD-10-CM | POA: Diagnosis not present

## 2018-06-18 DIAGNOSIS — Y929 Unspecified place or not applicable: Secondary | ICD-10-CM | POA: Insufficient documentation

## 2018-06-18 DIAGNOSIS — R52 Pain, unspecified: Secondary | ICD-10-CM | POA: Diagnosis not present

## 2018-06-18 DIAGNOSIS — S199XXA Unspecified injury of neck, initial encounter: Secondary | ICD-10-CM | POA: Diagnosis not present

## 2018-06-18 DIAGNOSIS — I10 Essential (primary) hypertension: Secondary | ICD-10-CM | POA: Insufficient documentation

## 2018-06-18 DIAGNOSIS — M25521 Pain in right elbow: Secondary | ICD-10-CM | POA: Diagnosis not present

## 2018-06-18 DIAGNOSIS — S59901A Unspecified injury of right elbow, initial encounter: Secondary | ICD-10-CM | POA: Diagnosis not present

## 2018-06-18 DIAGNOSIS — M25561 Pain in right knee: Secondary | ICD-10-CM | POA: Diagnosis not present

## 2018-06-18 DIAGNOSIS — Z7982 Long term (current) use of aspirin: Secondary | ICD-10-CM | POA: Insufficient documentation

## 2018-06-18 DIAGNOSIS — T148XXA Other injury of unspecified body region, initial encounter: Secondary | ICD-10-CM | POA: Insufficient documentation

## 2018-06-18 DIAGNOSIS — J45909 Unspecified asthma, uncomplicated: Secondary | ICD-10-CM | POA: Diagnosis not present

## 2018-06-18 DIAGNOSIS — T07XXXA Unspecified multiple injuries, initial encounter: Secondary | ICD-10-CM

## 2018-06-18 DIAGNOSIS — E785 Hyperlipidemia, unspecified: Secondary | ICD-10-CM | POA: Diagnosis not present

## 2018-06-18 DIAGNOSIS — S5011XA Contusion of right forearm, initial encounter: Secondary | ICD-10-CM | POA: Diagnosis not present

## 2018-06-18 DIAGNOSIS — R51 Headache: Secondary | ICD-10-CM | POA: Diagnosis not present

## 2018-06-18 MED ORDER — ACETAMINOPHEN 500 MG PO TABS
1000.0000 mg | ORAL_TABLET | Freq: Once | ORAL | Status: AC
  Administered 2018-06-19: 1000 mg via ORAL
  Filled 2018-06-18: qty 2

## 2018-06-18 NOTE — ED Triage Notes (Signed)
  Patient BIB EMS after falling around 2245.  Patient states she tripped over a box in the floor and landed on her R side and R forearm/elbow.  Patient states she also hit her forehead.  Small hematoma to R arm and abrasion to R forearm.  Patient states there was no LOC.  Pain 10/10.  A&O x4.  Patient had a stroke about 5 weeks ago and has existing R side deficits.

## 2018-06-18 NOTE — ED Provider Notes (Signed)
TIME SEEN: 11:41 PM  CHIEF COMPLAINT: Fall  HPI: Patient is a 72 year old right-hand-dominant female with history of hypertension, diabetes, hyperlipidemia, recent stroke who presents to the emergency department with complaints of mechanical fall that occurred just prior to arrival.  She reports that she tripped over a wreath on her floor and landed on her right side.  She did hit her head but did not lose consciousness.  She is on Plavix and possibly on anticoagulation as well (she is in a study and is not sure if she is receiving placebo versus ?eliquis).  Complains of right forearm pain, right elbow pain, right wrist pain, right knee pain.  She is complaining of right-sided headache.  No neck or back pain.  No chest or abdominal pain.  No new numbness or focal weakness.  ROS: See HPI Constitutional: no fever  Eyes: no drainage  ENT: no runny nose   Cardiovascular:  no chest pain  Resp: no SOB  GI: no vomiting GU: no dysuria Integumentary: no rash  Allergy: no hives  Musculoskeletal: no leg swelling  Neurological: no slurred speech ROS otherwise negative  PAST MEDICAL HISTORY/PAST SURGICAL HISTORY:  Past Medical History:  Diagnosis Date  . Anxiety   . Asthma   . CHF (congestive heart failure) ([place])   . Depression   . Diabetes mellitus   . GERD (gastroesophageal reflux disease)   . Heart murmur    echo- 04/2016- mild aortic stenosis  . History of kidney stones    passed  . Hypercholesteremia   . Hypertension   . Kidney stones    "last kidney numbers normal"  . Left ear hearing loss   . Osteoarthritis   . Seizures ([place]) 04/2016   due to Hyperosmolar Nonketotic hyperglycemia- is what it was thought to be cause by. No further seizure activity.  . Thrombophlebitis     MEDICATIONS:  Prior to Admission medications   Medication Sig Start Date End Date Taking? Authorizing Provider  aspirin EC 81 MG EC tablet Take 1 tablet (81 mg total) by mouth daily. 05/12/18   Hosie Poisson,  MD  beta carotene w/minerals (OCUVITE) tablet Take 1 tablet by mouth daily.    [provider]  Calcium Carbonate-Vitamin D (CALTRATE 600+D) 600-400 MG-UNIT per tablet Take 2 tablets by mouth daily.     [provider]  Carboxymethylcellul-Glycerin (LUBRICATING EYE DROPS OP) Place 1-2 drops into both eyes 4 (four) times daily as needed (for dry eyes).    [provider]  clopidogrel (PLAVIX) 75 MG tablet Take 1 tablet (75 mg total) by mouth daily. Please contact your neurologist for further refills. 06/02/18   Allred, Jeneen Rinks, MD  dexlansoprazole (DEXILANT) 60 MG capsule Take 60 mg by mouth daily.      [provider]  dextromethorphan-guaiFENesin (MUCINEX DM) 30-600 MG 12hr tablet Take 1 tablet by mouth 2 (two) times daily as needed for cough.    [provider]  ferrous sulfate 325 (65 FE) MG tablet Take 325 mg by mouth daily with breakfast.    [provider]  furosemide (LASIX) 40 MG tablet Take 40 mg by mouth 2 (two) times daily.    [provider]  insulin aspart (NOVOLOG FLEXPEN) 100 UNIT/ML FlexPen Inject 10-30 Units into the skin 3 (three) times daily with meals. Per sliding scale     [provider]  Lidocaine-Hydrocortisone Ace 3-0.5 % CREA Apply 1 application topically daily as needed (for itching).    [provider]  liraglutide (  VICTOZA) 18 MG/3ML SOPN Inject 1.8 mg into the skin daily.    [provider]  lisinopril (PRINIVIL,ZESTRIL) 10 MG tablet Take 10 mg by mouth daily.    [provider]  meloxicam (MOBIC) 15 MG tablet Take 15 mg by mouth daily.    [provider]  metoprolol succinate (TOPROL-XL) 25 MG 24 hr tablet Take 1 tablet (25 mg total) by mouth daily. 11/18/17   Jerline Pain, MD  ondansetron (ZOFRAN) 4 MG tablet Take 1 tablet (4 mg total) by mouth every 8 (eight) hours as needed for nausea or vomiting. 05/12/18   Hosie Poisson, MD  PARoxetine (PAXIL) 40 MG tablet Take  80 mg by mouth every morning.     [provider]  polyethylene glycol (MIRALAX / GLYCOLAX) packet Take 17 g by mouth daily as needed for mild constipation.    [provider]  Probiotic Product (ALIGN) 4 MG CAPS Take 8 mg by mouth daily.    [provider]  simvastatin (ZOCOR) 40 MG tablet Take 40 mg by mouth every evening. 05/01/18   [provider]  TRESIBA FLEXTOUCH 200 UNIT/ML SOPN Inject 80 Units into the skin daily. Will need further dose adjustment at Follow up 04/18/16   Domenic Polite, MD    ALLERGIES:  Allergies  Allergen Reactions  . Crestor [Rosuvastatin] Other (See Comments)    Myalgias  . Indomethacin Other (See Comments)    dizziness  . Invanz [Ertapenem Sodium] Hives  . Lipitor [Atorvastatin Calcium] Other (See Comments)    Myalgias   . Reglan [Metoclopramide] Other (See Comments)    TREMORS    SOCIAL HISTORY:  Social History   Tobacco Use  . Smoking status: Never Smoker  . Smokeless tobacco: Never Used  Substance Use Topics  . Alcohol use: Not Currently    Frequency: Never    Comment: maybe 1 drink every few months    FAMILY HISTORY: Family History  Problem Relation Age of Onset  . Heart attack Mother        8 HEART ATTACKS  . Stroke Mother        3 STROKES  . Heart attack Father   . Stroke Father     EXAM: BP (!) 133/99 (BP Location: Left Arm)   Pulse 63   Temp 98.6 F (37 C) (Oral)   Resp 18   Wt 119.2 kg   LMP  (LMP Unknown)   SpO2 98%   BMI 53.08 kg/m  CONSTITUTIONAL: Alert and oriented and responds appropriately to questions. Well-appearing; well-nourished; GCS 15, obese HEAD: Normocephalic; atraumatic EYES: Conjunctivae clear, PERRL, EOMI ENT: normal nose; no rhinorrhea; moist mucous membranes; pharynx without lesions noted; no dental injury; no septal hematoma NECK: Supple, no meningismus, no LAD; no midline spinal tenderness, step-off or deformity; trachea midline CARD: RRR; S1 and S2  appreciated; no murmurs, no clicks, no rubs, no gallops RESP: Normal chest excursion without splinting or tachypnea; breath sounds clear and equal bilaterally; no wheezes, no rhonchi, no rales; no hypoxia or respiratory distress CHEST:  chest wall stable, no crepitus or ecchymosis or deformity, nontender to palpation; no flail chest ABD/GI: Normal bowel sounds; non-distended; soft, non-tender, no rebound, no guarding; no ecchymosis or other lesions noted PELVIS:  stable, nontender to palpation BACK:  The back appears normal and is non-tender to palpation, there is no CVA tenderness; no midline spinal tenderness, step-off or deformity EXT: No to palpation over the right knee, right wrist, right elbow, right forearm.  Small soft tissue swelling and ecchymosis to the right forearm.  2+ DP and radial pulses bilaterally.  Compartments are all soft.  No obvious bony deformity on examination.  No joint effusion noted. SKIN: Normal color for age and race; warm NEURO: Moves all extremities equally PSYCH: The patient's mood and manner are appropriate. Grooming and personal hygiene are appropriate.  MEDICAL DECISION MAKING: Patient here with mechanical fall.  Will obtain CT of the head, cervical spine she is complaining of headache and is on antiplatelets and possibly anticoagulation as well.  We will x-ray the right arm, right knee.  Will provide Tylenol for pain.  ED PROGRESS: Patient's imaging shows no acute abnormality.  Will ambulate patient in the ED and if she does well will discharge home.  Instructed to use Tylenol at home for pain.  At this time, I do not feel there is any life-threatening condition present. I have reviewed and discussed all results (EKG, imaging, lab, urine as appropriate) and exam findings with patient/family. I have reviewed nursing notes and appropriate previous records.  I feel the patient is safe to be discharged home without further emergent workup and can continue workup as an  outpatient as needed. Discussed usual and customary return precautions. Patient/family verbalize understanding and are comfortable with this plan.  Outpatient follow-up has been provided as needed. All questions have been answered.      Ward, Delice Bison, DO 06/19/18 [phone]

## 2018-06-19 ENCOUNTER — Emergency Department (HOSPITAL_COMMUNITY): Payer: Medicare Other

## 2018-06-19 DIAGNOSIS — S59911A Unspecified injury of right forearm, initial encounter: Secondary | ICD-10-CM | POA: Diagnosis not present

## 2018-06-19 DIAGNOSIS — M25521 Pain in right elbow: Secondary | ICD-10-CM | POA: Diagnosis not present

## 2018-06-19 DIAGNOSIS — M25531 Pain in right wrist: Secondary | ICD-10-CM | POA: Diagnosis not present

## 2018-06-19 DIAGNOSIS — M79631 Pain in right forearm: Secondary | ICD-10-CM | POA: Diagnosis not present

## 2018-06-19 DIAGNOSIS — R51 Headache: Secondary | ICD-10-CM | POA: Diagnosis not present

## 2018-06-19 DIAGNOSIS — S59901A Unspecified injury of right elbow, initial encounter: Secondary | ICD-10-CM | POA: Diagnosis not present

## 2018-06-19 DIAGNOSIS — S0990XA Unspecified injury of head, initial encounter: Secondary | ICD-10-CM | POA: Diagnosis not present

## 2018-06-19 DIAGNOSIS — S6991XA Unspecified injury of right wrist, hand and finger(s), initial encounter: Secondary | ICD-10-CM | POA: Diagnosis not present

## 2018-06-19 DIAGNOSIS — S8991XA Unspecified injury of right lower leg, initial encounter: Secondary | ICD-10-CM | POA: Diagnosis not present

## 2018-06-19 DIAGNOSIS — M25561 Pain in right knee: Secondary | ICD-10-CM | POA: Diagnosis not present

## 2018-06-19 DIAGNOSIS — S199XXA Unspecified injury of neck, initial encounter: Secondary | ICD-10-CM | POA: Diagnosis not present

## 2018-06-19 NOTE — Discharge Instructions (Signed)
Your imaging showed no acute abnormalities today.  No fractures noted.  You may use Tylenol 1000 mg every 6 hours as needed for pain.

## 2018-06-19 NOTE — ED Notes (Signed)
  Patient ambulated well with minimal assistance

## 2018-06-21 DIAGNOSIS — Z961 Presence of intraocular lens: Secondary | ICD-10-CM | POA: Diagnosis not present

## 2018-06-21 DIAGNOSIS — H26492 Other secondary cataract, left eye: Secondary | ICD-10-CM | POA: Diagnosis not present

## 2018-06-30 ENCOUNTER — Ambulatory Visit: Payer: Self-pay | Admitting: Neurology

## 2018-07-01 DIAGNOSIS — I6930 Unspecified sequelae of cerebral infarction: Secondary | ICD-10-CM | POA: Diagnosis not present

## 2018-07-01 DIAGNOSIS — Z794 Long term (current) use of insulin: Secondary | ICD-10-CM | POA: Diagnosis not present

## 2018-07-01 DIAGNOSIS — E1165 Type 2 diabetes mellitus with hyperglycemia: Secondary | ICD-10-CM | POA: Diagnosis not present

## 2018-07-12 LAB — CUP PACEART REMOTE DEVICE CHECK
Implantable Pulse Generator Implant Date: 20191030
MDC IDC SESS DTM: 20191202173548

## 2018-07-15 ENCOUNTER — Other Ambulatory Visit: Payer: Self-pay | Admitting: Internal Medicine

## 2018-07-19 ENCOUNTER — Ambulatory Visit (INDEPENDENT_AMBULATORY_CARE_PROVIDER_SITE_OTHER): Payer: Medicare Other

## 2018-07-19 DIAGNOSIS — I6330 Cerebral infarction due to thrombosis of unspecified cerebral artery: Secondary | ICD-10-CM | POA: Diagnosis not present

## 2018-07-20 LAB — CUP PACEART REMOTE DEVICE CHECK
Date Time Interrogation Session: 20200104183724
Implantable Pulse Generator Implant Date: 20191030

## 2018-07-20 NOTE — Progress Notes (Signed)
Carelink Summary Report / Loop Recorder 

## 2018-07-26 ENCOUNTER — Emergency Department (HOSPITAL_COMMUNITY): Payer: Medicare Other

## 2018-07-26 ENCOUNTER — Encounter (HOSPITAL_COMMUNITY): Payer: Self-pay

## 2018-07-26 ENCOUNTER — Telehealth: Payer: Self-pay

## 2018-07-26 ENCOUNTER — Other Ambulatory Visit: Payer: Self-pay

## 2018-07-26 ENCOUNTER — Inpatient Hospital Stay (HOSPITAL_COMMUNITY)
Admission: EM | Admit: 2018-07-26 | Discharge: 2018-08-10 | DRG: 330 | Disposition: A | Discharge status: Hospice | Payer: Medicare Other | Attending: Internal Medicine | Admitting: Internal Medicine

## 2018-07-26 DIAGNOSIS — Z915 Personal history of self-harm: Secondary | ICD-10-CM

## 2018-07-26 DIAGNOSIS — Z7189 Other specified counseling: Secondary | ICD-10-CM

## 2018-07-26 DIAGNOSIS — R748 Abnormal levels of other serum enzymes: Secondary | ICD-10-CM | POA: Diagnosis present

## 2018-07-26 DIAGNOSIS — Z79899 Other long term (current) drug therapy: Secondary | ICD-10-CM

## 2018-07-26 DIAGNOSIS — I2510 Atherosclerotic heart disease of native coronary artery without angina pectoris: Secondary | ICD-10-CM | POA: Diagnosis present

## 2018-07-26 DIAGNOSIS — I130 Hypertensive heart and chronic kidney disease with heart failure and stage 1 through stage 4 chronic kidney disease, or unspecified chronic kidney disease: Secondary | ICD-10-CM | POA: Diagnosis present

## 2018-07-26 DIAGNOSIS — K635 Polyp of colon: Secondary | ICD-10-CM | POA: Diagnosis present

## 2018-07-26 DIAGNOSIS — F419 Anxiety disorder, unspecified: Secondary | ICD-10-CM | POA: Diagnosis present

## 2018-07-26 DIAGNOSIS — I4892 Unspecified atrial flutter: Secondary | ICD-10-CM | POA: Diagnosis present

## 2018-07-26 DIAGNOSIS — Z8673 Personal history of transient ischemic attack (TIA), and cerebral infarction without residual deficits: Secondary | ICD-10-CM

## 2018-07-26 DIAGNOSIS — I5032 Chronic diastolic (congestive) heart failure: Secondary | ICD-10-CM | POA: Diagnosis present

## 2018-07-26 DIAGNOSIS — R1084 Generalized abdominal pain: Secondary | ICD-10-CM | POA: Diagnosis not present

## 2018-07-26 DIAGNOSIS — R140 Abdominal distension (gaseous): Secondary | ICD-10-CM

## 2018-07-26 DIAGNOSIS — H9192 Unspecified hearing loss, left ear: Secondary | ICD-10-CM | POA: Diagnosis present

## 2018-07-26 DIAGNOSIS — E1165 Type 2 diabetes mellitus with hyperglycemia: Secondary | ICD-10-CM | POA: Diagnosis present

## 2018-07-26 DIAGNOSIS — N183 Chronic kidney disease, stage 3 (moderate): Secondary | ICD-10-CM | POA: Diagnosis present

## 2018-07-26 DIAGNOSIS — R112 Nausea with vomiting, unspecified: Secondary | ICD-10-CM | POA: Diagnosis not present

## 2018-07-26 DIAGNOSIS — K6389 Other specified diseases of intestine: Secondary | ICD-10-CM | POA: Diagnosis not present

## 2018-07-26 DIAGNOSIS — Z7901 Long term (current) use of anticoagulants: Secondary | ICD-10-CM

## 2018-07-26 DIAGNOSIS — E872 Acidosis: Secondary | ICD-10-CM | POA: Diagnosis not present

## 2018-07-26 DIAGNOSIS — E876 Hypokalemia: Secondary | ICD-10-CM | POA: Diagnosis not present

## 2018-07-26 DIAGNOSIS — E118 Type 2 diabetes mellitus with unspecified complications: Secondary | ICD-10-CM

## 2018-07-26 DIAGNOSIS — E1122 Type 2 diabetes mellitus with diabetic chronic kidney disease: Secondary | ICD-10-CM | POA: Diagnosis present

## 2018-07-26 DIAGNOSIS — R1114 Bilious vomiting: Secondary | ICD-10-CM

## 2018-07-26 DIAGNOSIS — Z794 Long term (current) use of insulin: Secondary | ICD-10-CM

## 2018-07-26 DIAGNOSIS — R7989 Other specified abnormal findings of blood chemistry: Secondary | ICD-10-CM

## 2018-07-26 DIAGNOSIS — K219 Gastro-esophageal reflux disease without esophagitis: Secondary | ICD-10-CM | POA: Diagnosis present

## 2018-07-26 DIAGNOSIS — R109 Unspecified abdominal pain: Secondary | ICD-10-CM

## 2018-07-26 DIAGNOSIS — Z008 Encounter for other general examination: Secondary | ICD-10-CM

## 2018-07-26 DIAGNOSIS — I472 Ventricular tachycardia: Secondary | ICD-10-CM | POA: Diagnosis present

## 2018-07-26 DIAGNOSIS — R52 Pain, unspecified: Secondary | ICD-10-CM | POA: Diagnosis not present

## 2018-07-26 DIAGNOSIS — R945 Abnormal results of liver function studies: Secondary | ICD-10-CM

## 2018-07-26 DIAGNOSIS — K5792 Diverticulitis of intestine, part unspecified, without perforation or abscess without bleeding: Secondary | ICD-10-CM | POA: Diagnosis not present

## 2018-07-26 DIAGNOSIS — Z66 Do not resuscitate: Secondary | ICD-10-CM | POA: Diagnosis present

## 2018-07-26 DIAGNOSIS — R0902 Hypoxemia: Secondary | ICD-10-CM | POA: Diagnosis not present

## 2018-07-26 DIAGNOSIS — Z942 Lung transplant status: Secondary | ICD-10-CM

## 2018-07-26 DIAGNOSIS — N179 Acute kidney failure, unspecified: Secondary | ICD-10-CM | POA: Diagnosis not present

## 2018-07-26 DIAGNOSIS — E878 Other disorders of electrolyte and fluid balance, not elsewhere classified: Secondary | ICD-10-CM | POA: Diagnosis present

## 2018-07-26 DIAGNOSIS — R1032 Left lower quadrant pain: Secondary | ICD-10-CM | POA: Diagnosis not present

## 2018-07-26 DIAGNOSIS — E1136 Type 2 diabetes mellitus with diabetic cataract: Secondary | ICD-10-CM | POA: Diagnosis present

## 2018-07-26 DIAGNOSIS — K660 Peritoneal adhesions (postprocedural) (postinfection): Secondary | ICD-10-CM | POA: Diagnosis present

## 2018-07-26 DIAGNOSIS — Z9842 Cataract extraction status, left eye: Secondary | ICD-10-CM

## 2018-07-26 DIAGNOSIS — I639 Cerebral infarction, unspecified: Secondary | ICD-10-CM

## 2018-07-26 DIAGNOSIS — E785 Hyperlipidemia, unspecified: Secondary | ICD-10-CM | POA: Diagnosis present

## 2018-07-26 DIAGNOSIS — Z452 Encounter for adjustment and management of vascular access device: Secondary | ICD-10-CM

## 2018-07-26 DIAGNOSIS — K560 Paralytic ileus: Secondary | ICD-10-CM | POA: Diagnosis present

## 2018-07-26 DIAGNOSIS — J45909 Unspecified asthma, uncomplicated: Secondary | ICD-10-CM | POA: Diagnosis present

## 2018-07-26 DIAGNOSIS — K429 Umbilical hernia without obstruction or gangrene: Secondary | ICD-10-CM | POA: Diagnosis not present

## 2018-07-26 DIAGNOSIS — K649 Unspecified hemorrhoids: Secondary | ICD-10-CM | POA: Diagnosis present

## 2018-07-26 DIAGNOSIS — I4891 Unspecified atrial fibrillation: Secondary | ICD-10-CM | POA: Diagnosis not present

## 2018-07-26 DIAGNOSIS — I10 Essential (primary) hypertension: Secondary | ICD-10-CM | POA: Diagnosis not present

## 2018-07-26 DIAGNOSIS — K9189 Other postprocedural complications and disorders of digestive system: Secondary | ICD-10-CM | POA: Diagnosis present

## 2018-07-26 DIAGNOSIS — Z6841 Body Mass Index (BMI) 40.0 and over, adult: Secondary | ICD-10-CM

## 2018-07-26 DIAGNOSIS — Z515 Encounter for palliative care: Secondary | ICD-10-CM | POA: Diagnosis present

## 2018-07-26 DIAGNOSIS — R569 Unspecified convulsions: Secondary | ICD-10-CM | POA: Diagnosis present

## 2018-07-26 DIAGNOSIS — Z8249 Family history of ischemic heart disease and other diseases of the circulatory system: Secondary | ICD-10-CM

## 2018-07-26 DIAGNOSIS — I480 Paroxysmal atrial fibrillation: Secondary | ICD-10-CM | POA: Diagnosis present

## 2018-07-26 DIAGNOSIS — T17908A Unspecified foreign body in respiratory tract, part unspecified causing other injury, initial encounter: Secondary | ICD-10-CM

## 2018-07-26 DIAGNOSIS — Z8672 Personal history of thrombophlebitis: Secondary | ICD-10-CM

## 2018-07-26 DIAGNOSIS — E870 Hyperosmolality and hypernatremia: Secondary | ICD-10-CM | POA: Diagnosis present

## 2018-07-26 DIAGNOSIS — Z5320 Procedure and treatment not carried out because of patient's decision for unspecified reasons: Secondary | ICD-10-CM | POA: Diagnosis present

## 2018-07-26 DIAGNOSIS — F319 Bipolar disorder, unspecified: Secondary | ICD-10-CM | POA: Diagnosis present

## 2018-07-26 DIAGNOSIS — Z818 Family history of other mental and behavioral disorders: Secondary | ICD-10-CM

## 2018-07-26 DIAGNOSIS — Z961 Presence of intraocular lens: Secondary | ICD-10-CM | POA: Diagnosis present

## 2018-07-26 DIAGNOSIS — Z9841 Cataract extraction status, right eye: Secondary | ICD-10-CM

## 2018-07-26 DIAGNOSIS — D62 Acute posthemorrhagic anemia: Secondary | ICD-10-CM | POA: Diagnosis not present

## 2018-07-26 DIAGNOSIS — R509 Fever, unspecified: Secondary | ICD-10-CM

## 2018-07-26 DIAGNOSIS — Z87442 Personal history of urinary calculi: Secondary | ICD-10-CM

## 2018-07-26 DIAGNOSIS — I700 Atherosclerosis of aorta: Secondary | ICD-10-CM | POA: Diagnosis present

## 2018-07-26 DIAGNOSIS — Z7982 Long term (current) use of aspirin: Secondary | ICD-10-CM

## 2018-07-26 DIAGNOSIS — Z888 Allergy status to other drugs, medicaments and biological substances status: Secondary | ICD-10-CM

## 2018-07-26 DIAGNOSIS — E7800 Pure hypercholesterolemia, unspecified: Secondary | ICD-10-CM | POA: Diagnosis present

## 2018-07-26 DIAGNOSIS — K5732 Diverticulitis of large intestine without perforation or abscess without bleeding: Secondary | ICD-10-CM

## 2018-07-26 DIAGNOSIS — M1990 Unspecified osteoarthritis, unspecified site: Secondary | ICD-10-CM | POA: Diagnosis present

## 2018-07-26 DIAGNOSIS — E119 Type 2 diabetes mellitus without complications: Secondary | ICD-10-CM

## 2018-07-26 DIAGNOSIS — K5939 Other megacolon: Secondary | ICD-10-CM | POA: Diagnosis present

## 2018-07-26 DIAGNOSIS — Z823 Family history of stroke: Secondary | ICD-10-CM

## 2018-07-26 DIAGNOSIS — Z90710 Acquired absence of both cervix and uterus: Secondary | ICD-10-CM

## 2018-07-26 LAB — COMPREHENSIVE METABOLIC PANEL
ALT: 9 U/L (ref 0–44)
AST: 8 U/L — ABNORMAL LOW (ref 15–41)
Albumin: 3 g/dL — ABNORMAL LOW (ref 3.5–5.0)
Alkaline Phosphatase: 64 U/L (ref 38–126)
Anion gap: 17 — ABNORMAL HIGH (ref 5–15)
BUN: 21 mg/dL (ref 8–23)
CO2: 18 mmol/L — ABNORMAL LOW (ref 22–32)
Calcium: 8.8 mg/dL — ABNORMAL LOW (ref 8.9–10.3)
Chloride: 104 mmol/L (ref 98–111)
Creatinine, Ser: 1.5 mg/dL — ABNORMAL HIGH (ref 0.44–1.00)
GFR calc Af Amer: 40 mL/min — ABNORMAL LOW (ref >60)
GFR calc non Af Amer: 35 mL/min — ABNORMAL LOW (ref >60)
Glucose, Bld: 236 mg/dL — ABNORMAL HIGH (ref 70–99)
Potassium: 2.7 mmol/L — CL (ref 3.5–5.1)
Sodium: 139 mmol/L (ref 135–145)
Total Bilirubin: 1.4 mg/dL — ABNORMAL HIGH (ref 0.3–1.2)
Total Protein: 6.6 g/dL (ref 6.5–8.1)

## 2018-07-26 LAB — I-STAT CHEM 8, ED
BUN: 25 mg/dL — ABNORMAL HIGH (ref 8–23)
Calcium, Ion: 1.08 mmol/L — ABNORMAL LOW (ref 1.15–1.40)
Chloride: 105 mmol/L (ref 98–111)
Creatinine, Ser: 0.9 mg/dL (ref 0.44–1.00)
Glucose, Bld: 238 mg/dL — ABNORMAL HIGH (ref 70–99)
HCT: 34 % — ABNORMAL LOW (ref 36.0–46.0)
Hemoglobin: 11.6 g/dL — ABNORMAL LOW (ref 12.0–15.0)
Potassium: 3 mmol/L — ABNORMAL LOW (ref 3.5–5.1)
Sodium: 139 mmol/L (ref 135–145)
TCO2: 21 mmol/L — ABNORMAL LOW (ref 22–32)

## 2018-07-26 LAB — CBC WITH DIFFERENTIAL/PLATELET
Abs Immature Granulocytes: 0.07 10*3/uL (ref 0.00–0.07)
BASOS ABS: 0.1 10*3/uL (ref 0.0–0.1)
Basophils Relative: 0 %
Eosinophils Absolute: 0.1 10*3/uL (ref 0.0–0.5)
Eosinophils Relative: 1 %
HCT: 38.1 % (ref 36.0–46.0)
Hemoglobin: 11.8 g/dL — ABNORMAL LOW (ref 12.0–15.0)
Immature Granulocytes: 0 %
Lymphocytes Relative: 8 %
Lymphs Abs: 1.4 10*3/uL (ref 0.7–4.0)
MCH: 28.9 pg (ref 26.0–34.0)
MCHC: 31 g/dL (ref 30.0–36.0)
MCV: 93.2 fL (ref 80.0–100.0)
Monocytes Absolute: 1.2 10*3/uL — ABNORMAL HIGH (ref 0.1–1.0)
Monocytes Relative: 8 %
NRBC: 0 % (ref 0.0–0.2)
Neutro Abs: 13.5 10*3/uL — ABNORMAL HIGH (ref 1.7–7.7)
Neutrophils Relative %: 83 %
Platelets: 364 10*3/uL (ref 150–400)
RBC: 4.09 MIL/uL (ref 3.87–5.11)
RDW: 12.8 % (ref 11.5–15.5)
WBC: 16.3 10*3/uL — AB (ref 4.0–10.5)

## 2018-07-26 LAB — I-STAT CG4 LACTIC ACID, ED: Lactic Acid, Venous: 1.03 mmol/L (ref 0.5–1.9)

## 2018-07-26 LAB — GLUCOSE, CAPILLARY: Glucose-Capillary: 208 mg/dL — ABNORMAL HIGH (ref 70–99)

## 2018-07-26 LAB — LIPASE, BLOOD: Lipase: 22 U/L (ref 11–51)

## 2018-07-26 MED ORDER — ACETAMINOPHEN 650 MG RE SUPP: 650.0000 mg | Freq: Four times a day (QID) | RECTAL | Status: DC | PRN

## 2018-07-26 MED ORDER — POTASSIUM CHLORIDE 10 MEQ/100ML IV SOLN: 10.0000 meq | INTRAVENOUS | Status: DC

## 2018-07-26 MED ORDER — ONDANSETRON HCL 4 MG/2ML IJ SOLN: 4.0000 mg | Freq: Once | INTRAMUSCULAR | Status: DC

## 2018-07-26 MED ORDER — PIPERACILLIN-TAZOBACTAM 3.375 G IVPB
3.3750 g | Freq: Three times a day (TID) | INTRAVENOUS | Status: DC
  Administered 2018-07-27 – 2018-08-02 (×18): 3.375 g via INTRAVENOUS
  Filled 2018-07-26 (×19): qty 50

## 2018-07-26 MED ORDER — POTASSIUM CHLORIDE IN NACL 40-0.9 MEQ/L-% IV SOLN
INTRAVENOUS | Status: AC
  Administered 2018-07-26: 75 mL/h via INTRAVENOUS
  Filled 2018-07-26: qty 1000

## 2018-07-26 MED ORDER — MORPHINE SULFATE (PF) 2 MG/ML IV SOLN
1.0000 mg | INTRAVENOUS | Status: DC | PRN
  Administered 2018-07-27 – 2018-08-02 (×7): 2 mg via INTRAVENOUS
  Filled 2018-07-26 (×7): qty 1

## 2018-07-26 MED ORDER — MAGNESIUM SULFATE 2 GM/50ML IV SOLN
2.0000 g | Freq: Once | INTRAVENOUS | Status: AC
  Administered 2018-07-26: 2 g via INTRAVENOUS
  Filled 2018-07-26: qty 50

## 2018-07-26 MED ORDER — MORPHINE SULFATE (PF) 4 MG/ML IV SOLN
4.0000 mg | Freq: Once | INTRAVENOUS | Status: AC
  Administered 2018-07-26: 4 mg via INTRAVENOUS
  Filled 2018-07-26: qty 1

## 2018-07-26 MED ORDER — PIPERACILLIN-TAZOBACTAM 3.375 G IVPB 30 MIN
3.3750 g | Freq: Once | INTRAVENOUS | Status: AC
  Administered 2018-07-26: 3.375 g via INTRAVENOUS
  Filled 2018-07-26: qty 50

## 2018-07-26 MED ORDER — SODIUM CHLORIDE 0.9 % IV SOLN
INTRAVENOUS | Status: DC | PRN
  Administered 2018-07-26: 250 mL via INTRAVENOUS
  Administered 2018-07-31: 09:00:00 via INTRAVENOUS

## 2018-07-26 MED ORDER — ACETAMINOPHEN 325 MG PO TABS
650.0000 mg | ORAL_TABLET | Freq: Four times a day (QID) | ORAL | Status: DC | PRN
  Administered 2018-07-27: 650 mg via ORAL
  Filled 2018-07-26: qty 2

## 2018-07-26 MED ORDER — SODIUM CHLORIDE 0.9 % IV BOLUS
1000.0000 mL | Freq: Once | INTRAVENOUS | Status: AC
  Administered 2018-07-26: 1000 mL via INTRAVENOUS

## 2018-07-26 MED ORDER — ONDANSETRON HCL 4 MG PO TABS: 4.0000 mg | ORAL_TABLET | Freq: Four times a day (QID) | ORAL | Status: DC | PRN

## 2018-07-26 MED ORDER — INSULIN GLARGINE 100 UNIT/ML ~~LOC~~ SOLN
35.0000 [IU] | Freq: Every day | SUBCUTANEOUS | Status: DC
  Administered 2018-07-27 – 2018-08-03 (×6): 35 [IU] via SUBCUTANEOUS
  Filled 2018-07-26 (×9): qty 0.35

## 2018-07-26 MED ORDER — INSULIN ASPART 100 UNIT/ML ~~LOC~~ SOLN
0.0000 [IU] | SUBCUTANEOUS | Status: DC
  Administered 2018-07-26: 3 [IU] via SUBCUTANEOUS
  Administered 2018-07-27: 2 [IU] via SUBCUTANEOUS
  Administered 2018-07-27 (×3): 3 [IU] via SUBCUTANEOUS
  Administered 2018-07-27: 5 [IU] via SUBCUTANEOUS
  Administered 2018-07-28: 2 [IU] via SUBCUTANEOUS
  Administered 2018-07-28: 1 [IU] via SUBCUTANEOUS
  Administered 2018-07-28: 3 [IU] via SUBCUTANEOUS
  Administered 2018-07-28 – 2018-07-29 (×3): 2 [IU] via SUBCUTANEOUS
  Administered 2018-07-29: 1 [IU] via SUBCUTANEOUS
  Administered 2018-07-29 (×2): 2 [IU] via SUBCUTANEOUS
  Administered 2018-08-01 (×3): 1 [IU] via SUBCUTANEOUS
  Administered 2018-08-02: 3 [IU] via SUBCUTANEOUS
  Administered 2018-08-02 (×2): 1 [IU] via SUBCUTANEOUS
  Administered 2018-08-02: 3 [IU] via SUBCUTANEOUS
  Administered 2018-08-02: 1 [IU] via SUBCUTANEOUS
  Administered 2018-08-03: 3 [IU] via SUBCUTANEOUS
  Administered 2018-08-03: 5 [IU] via SUBCUTANEOUS
  Administered 2018-08-03 (×2): 2 [IU] via SUBCUTANEOUS
  Administered 2018-08-03: 3 [IU] via SUBCUTANEOUS
  Administered 2018-08-04: 1 [IU] via SUBCUTANEOUS

## 2018-07-26 MED ORDER — ONDANSETRON HCL 4 MG/2ML IJ SOLN
4.0000 mg | Freq: Four times a day (QID) | INTRAMUSCULAR | Status: DC | PRN
  Administered 2018-07-27 – 2018-07-28 (×3): 4 mg via INTRAVENOUS
  Filled 2018-07-26 (×3): qty 2

## 2018-07-26 MED ORDER — IOHEXOL 300 MG/ML  SOLN
80.0000 mL | Freq: Once | INTRAMUSCULAR | Status: AC | PRN
  Administered 2018-07-26: 80 mL via INTRAVENOUS

## 2018-07-26 MED ORDER — ONDANSETRON HCL 4 MG/2ML IJ SOLN
4.0000 mg | Freq: Once | INTRAMUSCULAR | Status: AC
  Administered 2018-07-26: 4 mg via INTRAVENOUS
  Filled 2018-07-26: qty 2

## 2018-07-26 NOTE — ED Provider Notes (Signed)
[facility] Provider Note   CSN: [id number] Arrival date & time: 07/26/18  1624     History   Chief Complaint Chief Complaint  Patient presents with  . Abdominal Pain    HPI Sharon [name] is a 73 y.o. female.  73 yo F with a chief complaint of abdominal pain nausea vomiting.  Is been going on for the past week.  The patient has a history of diverticulitis and she thinks that this is the same.  She is unsure if fevers or chills.  Has been constipated.  Also has a history of small bowel obstruction.  She has had difficulty eating and drinking.  The history is provided by the patient.  Abdominal Pain  Pain location:  LLQ Pain quality: sharp and shooting   Pain radiates to:  Does not radiate Pain severity:  Moderate Onset quality:  Sudden Duration:  1 week Timing:  Constant Progression:  Worsening Chronicity:  New Relieved by:  Nothing Worsened by:  Nothing Ineffective treatments:  None tried Associated symptoms: constipation, nausea and vomiting   Associated symptoms: no chest pain, no chills, no dysuria, no fever and no shortness of breath     Past Medical History:  Diagnosis Date  . Anxiety   . Asthma   . CHF (congestive heart failure) ([place])   . Depression   . Diabetes mellitus   . GERD (gastroesophageal reflux disease)   . Heart murmur    echo- 04/2016- mild aortic stenosis  . History of kidney stones    passed  . Hypercholesteremia   . Hypertension   . Kidney stones    "last kidney numbers normal"  . Left ear hearing loss   . Osteoarthritis   . Seizures ([place]) 04/2016   due to Hyperosmolar Nonketotic hyperglycemia- is what it was thought to be cause by. No further seizure activity.  . Thrombophlebitis     Patient Active Problem List   Diagnosis Date Noted  . 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 Cerebral thrombosis with cerebral infarction 05/11/2018  . CVA (cerebral vascular accident) ([place]) 05/11/2018  .  Stroke-like symptoms 05/10/2018  . Hyperosmolar (nonketotic) coma ([place]) 04/15/2016  . Seizure ([place]) 04/15/2016  . AKI (acute kidney injury) ([place]) 04/15/2016  . Chronic diastolic heart failure ([place]) 04/15/2016  . Chronic diastolic CHF (congestive heart failure) ([place]) 06/04/2015  . Diverticulitis 05/28/2015  . Sigmoid diverticulitis 05/28/2015  . Hypokalemia 05/28/2015  . Diabetes mellitus type 2, controlled ([place]) 05/28/2015  . Chronic anemia 05/28/2015  . Diverticulitis of large intestine without perforation or abscess without bleeding   . Palpitation 04/12/2015  . Morbid obesity ([place]) 04/12/2015  . Mild aortic stenosis 04/12/2015  . Type 2 diabetes mellitus without complication ([place]) 66/12/3014  . Diabetes mellitus ([place]) 06/27/2014  . Hypertension 06/27/2014  . Chest pain 06/27/2014  . Asthma 06/27/2014    Past Surgical History:  Procedure Laterality Date  . ABDOMINAL HYSTERECTOMY    . APPENDECTOMY    . BREAST BIOPSY Right    many yrs ago per pt; neg bx  . CARPAL TUNNEL RELEASE Bilateral   . CHOLECYSTECTOMY    . COLONOSCOPY    . COLONOSCOPY WITH PROPOFOL N/A 11/03/2017   Procedure: COLONOSCOPY WITH PROPOFOL;  Surgeon: Clarene Essex, MD;  Location: [facility];  Service: Endoscopy;  Laterality: N/A;  . COLONOSCOPY WITH PROPOFOL N/A 11/04/2017   Procedure: COLONOSCOPY WITH PROPOFOL;  Surgeon: Clarene Essex, MD;  Location: [facility];  Service: Endoscopy;  Laterality: N/A;  .  ESOPHAGOGASTRODUODENOSCOPY (EGD) WITH PROPOFOL N/A 11/03/2017   Procedure: ESOPHAGOGASTRODUODENOSCOPY (EGD) WITH PROPOFOL;  Surgeon: Clarene Essex, MD;  Location: [facility];  Service: Endoscopy;  Laterality: N/A;  . ESOPHAGOSCOPY    . EYE SURGERY Bilateral    Cataract with lens  . INCISION AND DRAINAGE PERIRECTAL ABSCESS    . LOOP RECORDER INSERTION N/A 05/12/2018   Procedure: LOOP RECORDER INSERTION;  Surgeon: Thompson Grayer, MD;  Location: [facility];  Service: Cardiovascular;  Laterality: N/A;  .  pyelogram    . TEE WITHOUT CARDIOVERSION N/A 05/12/2018   Procedure: TRANSESOPHAGEAL ECHOCARDIOGRAM (TEE);  Surgeon: Buford Dresser, MD;  [facility] ENDOSCOPY;  Service: Cardiovascular;  Laterality: N/A;     OB History   No obstetric history on file.      Home Medications    Prior to Admission medications   Medication Sig Start Date End Date Taking? Authorizing Provider  aspirin EC 81 MG EC tablet Take 1 tablet (81 mg total) by mouth daily. 05/12/18  Yes Hosie Poisson, MD  Calcium Carbonate-Vitamin D (CALTRATE 600+D) 600-400 MG-UNIT per tablet Take 2 tablets by mouth daily.    Yes [provider]  Carboxymethylcellul-Glycerin (LUBRICATING EYE DROPS OP) Place 1-2 drops into both eyes 4 (four) times daily as needed (for dry eyes).   Yes [provider]  dexlansoprazole (DEXILANT) 60 MG capsule Take 60 mg by mouth daily.     Yes [provider]  dextromethorphan-guaiFENesin (MUCINEX DM) 30-600 MG 12hr tablet Take 1 tablet by mouth 2 (two) times daily as needed for cough.   Yes [provider]  ferrous sulfate 325 (65 FE) MG tablet Take 325 mg by mouth daily with breakfast.   Yes [provider]  furosemide (LASIX) 40 MG tablet Take 40 mg by mouth 2 (two) times daily.   Yes [provider]  gabapentin (NEURONTIN) 300 MG capsule Take 600 mg by mouth 2 (two) times daily.   Yes [provider]  insulin aspart (NOVOLOG FLEXPEN) 100 UNIT/ML FlexPen Inject 10-30 Units into the skin 3 (three) times daily with meals. Per sliding scale    Yes [provider]  Lidocaine-Hydrocortisone Ace 3-0.5 % CREA Apply 1 application topically daily as needed (for itching).   Yes [provider]  liraglutide (VICTOZA) 18 MG/3ML SOPN Inject 1.8 mg into the skin daily.   Yes [provider]  lisinopril (PRINIVIL,ZESTRIL) 10 MG tablet Take 10 mg by mouth daily.   Yes [provider]  meloxicam (MOBIC) 15 MG  tablet Take 15 mg by mouth daily.   Yes [provider]  metoprolol succinate (TOPROL-XL) 25 MG 24 hr tablet Take 1 tablet (25 mg total) by mouth daily. 11/18/17  Yes Jerline Pain, MD  ondansetron (ZOFRAN) 4 MG tablet Take 1 tablet (4 mg total) by mouth every 8 (eight) hours as needed for nausea or vomiting. 05/12/18  Yes Hosie Poisson, MD  PARoxetine (PAXIL) 40 MG tablet Take 80 mg by mouth every morning.    Yes [provider]  polyethylene glycol (MIRALAX / GLYCOLAX) packet Take 17 g by mouth daily as needed for mild constipation.   Yes [provider]  Probiotic Product (ALIGN) 4 MG CAPS Take 8 mg by mouth daily.   Yes [provider]  simvastatin (ZOCOR) 40 MG tablet Take 40 mg by mouth every evening. 05/01/18  Yes [provider]  Study - [facility] - apixaban 5mg  or Placebo Take 2.5 mg by mouth 2 (two) times daily.  Yes [provider]  TRESIBA FLEXTOUCH 200 UNIT/ML SOPN Inject 80 Units into the skin daily. Will need further dose adjustment at Follow up 04/18/16  Yes Domenic Polite, MD  beta carotene w/minerals (OCUVITE) tablet Take 1 tablet by mouth daily.    [provider]  clopidogrel (PLAVIX) 75 MG tablet Take 1 tablet (75 mg total) by mouth daily. Please contact your neurologist for further refills. Patient not taking: Reported on 06/19/2018 06/02/18   Thompson Grayer, MD    Family History Family History  Problem Relation Age of Onset  . Heart attack Mother        8 HEART ATTACKS  . Stroke Mother        3 STROKES  . Heart attack Father   . Stroke Father     Social History Social History   Tobacco Use  . Smoking status: Never Smoker  . Smokeless tobacco: Never Used  Substance Use Topics  . Alcohol use: Not Currently    Frequency: Never    Comment: maybe 1 drink every few months  . Drug use: No     Allergies   Crestor [rosuvastatin]; Indomethacin; Invanz [ertapenem sodium]; Lipitor [atorvastatin calcium]; and  Reglan [metoclopramide]   Review of Systems Review of Systems  Constitutional: Negative for chills and fever.  HENT: Negative for congestion and rhinorrhea.   Eyes: Negative for redness and visual disturbance.  Respiratory: Negative for shortness of breath and wheezing.   Cardiovascular: Negative for chest pain and palpitations.  Gastrointestinal: Positive for abdominal pain, constipation, nausea and vomiting.  Genitourinary: Negative for dysuria and urgency.  Musculoskeletal: Negative for arthralgias and myalgias.  Skin: Negative for pallor and wound.  Neurological: Negative for dizziness and headaches.     Physical Exam Updated Vital Signs BP (!) 161/46 (BP Location: Left Arm)   Pulse 73   Temp 98.3 F (36.8 C) (Oral)   Resp 16   Ht 4\' 11"  (1.499 m)   Wt 120.2 kg   LMP  (LMP Unknown)   SpO2 97%   BMI 53.52 kg/m   Physical Exam Vitals signs and nursing note reviewed.  Constitutional:      General: She is not in acute distress.    Appearance: She is well-developed. She is not diaphoretic.  HENT:     Head: Normocephalic and atraumatic.  Eyes:     Pupils: Pupils are equal, round, and reactive to light.  Neck:     Musculoskeletal: Normal range of motion and neck supple.  Cardiovascular:     Rate and Rhythm: Normal rate and regular rhythm.     Heart sounds: No murmur. No friction rub. No gallop.   Pulmonary:     Effort: Pulmonary effort is normal.     Breath sounds: No wheezing or rales.  Abdominal:     General: There is no distension.     Palpations: Abdomen is soft.     Tenderness: There is abdominal tenderness in the left lower quadrant. There is no guarding or rebound.  Musculoskeletal:        General: No tenderness.  Skin:    General: Skin is warm and dry.  Neurological:     Mental Status: She is alert and oriented to person, place, and time.  Psychiatric:        Behavior: Behavior normal.      ED Treatments / Results  Labs (all labs ordered are  listed, but only abnormal results are displayed) Labs Reviewed  CBC WITH DIFFERENTIAL/PLATELET - Abnormal;  Notable for the following components:      Result Value   WBC 16.3 (*)    Hemoglobin 11.8 (*)    Neutro Abs 13.5 (*)    Monocytes Absolute 1.2 (*)    All other components within normal limits  COMPREHENSIVE METABOLIC PANEL - Abnormal; Notable for the following components:   Potassium 2.7 (*)    CO2 18 (*)    Glucose, Bld 236 (*)    Creatinine, Ser 1.50 (*)    Calcium 8.8 (*)    Albumin 3.0 (*)    AST 8 (*)    Total Bilirubin 1.4 (*)    GFR calc non Af Amer 35 (*)    GFR calc Af Amer 40 (*)    Anion gap 17 (*)    All other components within normal limits  GLUCOSE, CAPILLARY - Abnormal; Notable for the following components:   Glucose-Capillary 208 (*)    All other components within normal limits  I-STAT CHEM 8, ED - Abnormal; Notable for the following components:   Potassium 3.0 (*)    BUN 25 (*)    Glucose, Bld 238 (*)    Calcium, Ion 1.08 (*)    TCO2 21 (*)    Hemoglobin 11.6 (*)    HCT 34.0 (*)    All other components within normal limits  LIPASE, BLOOD  COMPREHENSIVE METABOLIC PANEL  CBC WITH DIFFERENTIAL/PLATELET  MAGNESIUM  I-STAT CG4 LACTIC ACID, ED    EKG EKG Interpretation  Date/Time:  Monday July 26 2018 16:31:32 EST Ventricular Rate:  80 PR Interval:    QRS Duration: 91 QT Interval:  410 QTC Calculation: 449 R Axis:   -22 Text Interpretation:  Sinus rhythm Atrial premature complexes in couplets Borderline left axis deviation Consider anterior infarct No significant change since last tracing Confirmed by Deno Etienne [phone]) on 07/26/2018 4:35:27 PM   Radiology Ct Abdomen Pelvis W Contrast  Result Date: 07/26/2018 CLINICAL DATA:  73 year old female with history of left-sided abdominal pain for 1 week. EXAM: CT ABDOMEN AND PELVIS WITH CONTRAST TECHNIQUE: Multidetector CT imaging of the abdomen and pelvis was performed using the standard protocol  following bolus administration of intravenous contrast. CONTRAST:  69mL OMNIPAQUE IOHEXOL 300 MG/ML  SOLN COMPARISON:  CT the abdomen and pelvis 11/01/2015. FINDINGS: Lower chest: Atherosclerotic calcifications in the descending thoracic aorta as well as the left circumflex and right coronary arteries. Calcifications of the aortic valve and mitral annulus. Hepatobiliary: No definite cystic or solid hepatic lesions. Mild intrahepatic biliary ductal dilatation. Common bile duct measures up to 18 mm in the porta hepatis. Both of the previous findings are only slightly increased compared to prior study from 2017. No calcified stone in the common bile duct. Status post cholecystectomy. Pancreas: No pancreatic mass. No pancreatic ductal dilatation. No pancreatic or peripancreatic fluid or inflammatory changes. Spleen: Unremarkable. Adrenals/Urinary Tract: 1.8 x 1.6 cm right adrenal nodule is indeterminate, but only minimally increased in size compared to prior exam 11/01/2015, statistically likely a benign lesions such as an adenoma. Left adrenal gland is normal in appearance. Multiple low-attenuation lesions in the kidneys bilaterally, compatible with simple cysts, largest of which is exophytic in the lateral aspect of the upper pole of the right kidney measuring 4.2 cm in diameter. There is also a subcentimeter intermediate attenuation lesion in the lower pole of the left kidney, too small to characterize, but stable in size compared to the prior study from 2017, likely a mildly proteinaceous cyst. No hydroureteronephrosis. Urinary bladder  is normal in appearance. Stomach/Bowel: Normal appearance of the stomach. No pathologic dilatation of small bowel or colon. Colon is mildly distended with multiple air-fluid levels (nonspecific). Notably, there appears to be pneumatosis involving the cecum, ascending colon and hepatic flexure of the colon. No surrounding inflammatory changes are noted. The appendix is not confidently  identified and may be surgically absent. Regardless, there are no inflammatory changes noted adjacent to the cecum to suggest the presence of an acute appendicitis at this time. Vascular/Lymphatic: No portal venous gas. Aortic atherosclerosis, without evidence of aneurysm or dissection in the abdominal or pelvic vasculature. Today's examination was not a CTA exam, however, the celiac axis, superior mesenteric artery, inferior mesenteric artery and their major branches all appear widely patent without hemodynamically significant stenosis. No lymphadenopathy noted in the abdomen or pelvis. Reproductive: Status post hysterectomy. Ovaries are not confidently identified may be surgically absent or atrophic. Other: Small umbilical hernia containing only omental fat. No significant volume of ascites. No pneumoperitoneum. Musculoskeletal: There are no aggressive appearing lytic or blastic lesions noted in the visualized portions of the skeleton. IMPRESSION: 1. Pneumatosis involving the cecum, ascending colon and hepatic flexure of the colon. No definite source for this pneumatosis is identified on today's examination. No associated portal venous gas. No major vascular occlusion to account for this finding. Further clinical evaluation is suggested. 2. Mild distention of the colon with multiple air-fluid levels. This is nonspecific. No findings to suggest bowel obstruction at this time. 3. Small umbilical hernia containing only omental fat, without evidence of bowel incarceration or obstruction. 4. Aortic atherosclerosis, in addition to least 2 vessel coronary artery disease. Assessment for potential risk factor modification, dietary therapy or pharmacologic therapy may be warranted, if clinically indicated. 5. Additional incidental findings, as above. Electronically Signed   By: Vinnie Langton M.D.   On: 07/26/2018 20:47    Procedures Procedures (including critical care time)  Medications Ordered in ED Medications    ondansetron (ZOFRAN) injection 4 mg (4 mg Intravenous Not Given 07/26/18 2137)  insulin glargine (LANTUS) injection 35 Units (has no administration in time range)  insulin aspart (novoLOG) injection 0-9 Units (3 Units Subcutaneous Given 07/26/18 2308)  0.9 % NaCl with KCl 40 mEq / L  infusion (75 mL/hr Intravenous New Bag/Given 07/26/18 2312)  acetaminophen (TYLENOL) tablet 650 mg (has no administration in time range)    Or  acetaminophen (TYLENOL) suppository 650 mg (has no administration in time range)  ondansetron (ZOFRAN) tablet 4 mg (has no administration in time range)    Or  ondansetron (ZOFRAN) injection 4 mg (has no administration in time range)  morphine 2 MG/ML injection 1-3 mg (has no administration in time range)  magnesium sulfate IVPB 2 g 50 mL (has no administration in time range)  piperacillin-tazobactam (ZOSYN) IVPB 3.375 g (has no administration in time range)  0.9 %  sodium chloride infusion (has no administration in time range)  sodium chloride 0.9 % bolus 1,000 mL (0 mLs Intravenous Stopped 07/26/18 1756)  morphine 4 MG/ML injection 4 mg (4 mg Intravenous Given 07/26/18 1727)  ondansetron (ZOFRAN) injection 4 mg (4 mg Intravenous Given 07/26/18 1727)  iohexol (OMNIPAQUE) 300 MG/ML solution 80 mL (80 mLs Intravenous Contrast Given 07/26/18 2019)  morphine 4 MG/ML injection 4 mg (4 mg Intravenous Given 07/26/18 2134)  ondansetron (ZOFRAN) injection 4 mg (4 mg Intravenous Given 07/26/18 2134)  piperacillin-tazobactam (ZOSYN) IVPB 3.375 g (3.375 g Intravenous New Bag/Given 07/26/18 2148)     Initial Impression /  Assessment and Plan / ED Course  I have reviewed the triage vital signs and the nursing notes.  Pertinent labs & imaging results that were available during my care of the patient were reviewed by me and considered in my medical decision making (see chart for details).     73 yo F with a chief complaint of left lower quadrant abdominal pain nausea vomiting.  She has  had a history of a small bowel obstruction as well as diverticulitis.  Thinks this diverticulitis.  Will give pain and nausea medicine check lab work obtain a CT of the abdomen pelvis with contrast.  Patient has had an anion gap acidosis, she has a normal lactate.  I wonder if she has a ketosis from her vomiting.  Renal function is at baseline.  She has a leukocytosis of 16.3.  Patient having persistent pain and nausea.  Given repeat doses of medications.  CT scan shows pneumatosis of unknown etiology, she does have multiple dilated small bowel loops without obvious signs of obstruction.  I discussed the case with the general surgeon, Dr. Windle Guard she recommended starting the patient on antibiotics and discussing with the hospitalist for admission.  The patients results and plan were reviewed and discussed.   Any x-rays performed were independently reviewed by myself.   Differential diagnosis were considered with the presenting HPI.  Medications  ondansetron (ZOFRAN) injection 4 mg (4 mg Intravenous Not Given 07/26/18 2137)  insulin glargine (LANTUS) injection 35 Units (has no administration in time range)  insulin aspart (novoLOG) injection 0-9 Units (3 Units Subcutaneous Given 07/26/18 2308)  0.9 % NaCl with KCl 40 mEq / L  infusion (75 mL/hr Intravenous New Bag/Given 07/26/18 2312)  acetaminophen (TYLENOL) tablet 650 mg (has no administration in time range)    Or  acetaminophen (TYLENOL) suppository 650 mg (has no administration in time range)  ondansetron (ZOFRAN) tablet 4 mg (has no administration in time range)    Or  ondansetron (ZOFRAN) injection 4 mg (has no administration in time range)  morphine 2 MG/ML injection 1-3 mg (has no administration in time range)  magnesium sulfate IVPB 2 g 50 mL (has no administration in time range)  piperacillin-tazobactam (ZOSYN) IVPB 3.375 g (has no administration in time range)  0.9 %  sodium chloride infusion (has no administration in time range)   sodium chloride 0.9 % bolus 1,000 mL (0 mLs Intravenous Stopped 07/26/18 1756)  morphine 4 MG/ML injection 4 mg (4 mg Intravenous Given 07/26/18 1727)  ondansetron (ZOFRAN) injection 4 mg (4 mg Intravenous Given 07/26/18 1727)  iohexol (OMNIPAQUE) 300 MG/ML solution 80 mL (80 mLs Intravenous Contrast Given 07/26/18 2019)  morphine 4 MG/ML injection 4 mg (4 mg Intravenous Given 07/26/18 2134)  ondansetron (ZOFRAN) injection 4 mg (4 mg Intravenous Given 07/26/18 2134)  piperacillin-tazobactam (ZOSYN) IVPB 3.375 g (3.375 g Intravenous New Bag/Given 07/26/18 2148)    Vitals:   07/26/18 1636 07/26/18 1645 07/26/18 2138 07/26/18 2247  BP:  (!) 142/66 139/63 (!) 161/46  Pulse:  75 88 73  Resp:  (!) 23 (!) 21 16  Temp:    98.3 F (36.8 C)  TempSrc:    Oral  SpO2:  98% 91% 97%  Weight: 120.2 kg     Height: 4\' 11"  (1.499 m)       Final diagnoses:  Left lower quadrant abdominal pain  Pneumatosis intestinalis    Admission/ observation were discussed with the admitting physician, patient and/or family and they are comfortable with the  plan.    Final Clinical Impressions(s) / ED Diagnoses   Final diagnoses:  Left lower quadrant abdominal pain  Pneumatosis intestinalis    ED Discharge Orders    None       Deno Etienne, DO 07/26/18 2319

## 2018-07-26 NOTE — Telephone Encounter (Signed)
Spoke with pts sister ( ok per Pt) d/t pt not feeling well informed her of pt needed apt with AF clinic,pt and pts sister agreeable to apt on 08/03/2018 at 3:00pm, number to AF clinic given for pts sister to call for directions and parking.  Also informed pt that Dr. Curt Bears had reviewed the transmission and episodes appear to be AF.

## 2018-07-26 NOTE — ED Notes (Signed)
Mariann Laster (sister) [phone]

## 2018-07-26 NOTE — H&P (Signed)
History and Physical    Sharon [name] FIE:[id number] DOB: June 21, 1946 DOA: 07/26/2018  PCP: [name], MD   Patient coming from: Home   Chief Complaint: Abdominal pain, N/V   HPI: Sharon [name] is a 73 y.o. female with medical history significant for chronic diastolic CHF, insulin-dependent diabetes mellitus, CVA, and possible atrial fibrillation seen on loop recorder, now presenting to the emergency department for evaluation of abdominal pain with nausea and nonbloody vomiting.  Patient reports that she had been in her usual state of health until she developed some constipation little less than a week ago, experienced some relief with a Fleet enema, but went on to develop pain in the lower abdomen and on the left.  Pain is described as waxing and waning, severe at times, not associated with melena or hematochezia, and no diarrhea.  She reports that symptoms are similar to when she has had acute diverticulitis.  She has not appreciated any fevers.  ED Course: Upon arrival to the ED, patient is found to have stable vital signs.  EKG features a sinus rhythm with PACs.  Chemistry panel is notable for potassium of 2.7, bicarbonate 18, anion gap 17, and creatinine of 1.50.  CBC features a leukocytosis to 16,300.  CT the abdomen and pelvis reveals pneumatosis involving the cecum, ascending colon, and hepatic flexure without identifiable etiology.  Patient was given morphine, Zofran, Zosyn, and 1 L of normal saline in the ED.  General surgery was consulted by the ED physician and recommended medical admission.  Review of Systems:  All other systems reviewed and apart from HPI, are negative.  Past Medical History:  Diagnosis Date  . Anxiety   . Asthma   . CHF (congestive heart failure) ([place])   . Depression   . Diabetes mellitus   . GERD (gastroesophageal reflux disease)   . Heart murmur    echo- 04/2016- mild aortic stenosis  . History of kidney stones    passed  . Hypercholesteremia   .  Hypertension   . Kidney stones    "last kidney numbers normal"  . Left ear hearing loss   . Osteoarthritis   . Seizures ([place]) 04/2016   due to Hyperosmolar Nonketotic hyperglycemia- is what it was thought to be cause by. No further seizure activity.  . Thrombophlebitis     Past Surgical History:  Procedure Laterality Date  . ABDOMINAL HYSTERECTOMY    . APPENDECTOMY    . BREAST BIOPSY Right    many yrs ago per pt; neg bx  . CARPAL TUNNEL RELEASE Bilateral   . CHOLECYSTECTOMY    . COLONOSCOPY    . COLONOSCOPY WITH PROPOFOL N/A 11/03/2017   Procedure: COLONOSCOPY WITH PROPOFOL;  Surgeon: Clarene Essex, MD;  Location: [place];  Service: Endoscopy;  Laterality: N/A;  . COLONOSCOPY WITH PROPOFOL N/A 11/04/2017   Procedure: COLONOSCOPY WITH PROPOFOL;  Surgeon: Clarene Essex, MD;  Location: [place];  Service: Endoscopy;  Laterality: N/A;  . ESOPHAGOGASTRODUODENOSCOPY (EGD) WITH PROPOFOL N/A 11/03/2017   Procedure: ESOPHAGOGASTRODUODENOSCOPY (EGD) WITH PROPOFOL;  Surgeon: Clarene Essex, MD;  Location: [place];  Service: Endoscopy;  Laterality: N/A;  . ESOPHAGOSCOPY    . EYE SURGERY Bilateral    Cataract with lens  . INCISION AND DRAINAGE PERIRECTAL ABSCESS    . LOOP RECORDER INSERTION N/A 05/12/2018   Procedure: LOOP RECORDER INSERTION;  Surgeon: Thompson Grayer, MD;  Location: [place] [facility];  Service: Cardiovascular;  Laterality: N/A;  . pyelogram    . TEE WITHOUT CARDIOVERSION  N/A 05/12/2018   Procedure: TRANSESOPHAGEAL ECHOCARDIOGRAM (TEE);  Surgeon: Buford Dresser, MD;  Location: [facility] ENDOSCOPY;  Service: Cardiovascular;  Laterality: N/A;     reports that she has never smoked. She has never used smokeless tobacco. She reports previous alcohol use. She reports that she does not use drugs.  Allergies  Allergen Reactions  . Crestor [Rosuvastatin] Other (See Comments)    Myalgias  . Indomethacin Other (See Comments)    dizziness  . Invanz [Ertapenem Sodium] Hives  .  Lipitor [Atorvastatin Calcium] Other (See Comments)    Myalgias   . Reglan [Metoclopramide] Other (See Comments)    TREMORS    Family History  Problem Relation Age of Onset  . Heart attack Mother        8 HEART ATTACKS  . Stroke Mother        3 STROKES  . Heart attack Father   . Stroke Father      Prior to Admission medications   Medication Sig Start Date End Date Taking? Authorizing Provider  aspirin EC 81 MG EC tablet Take 1 tablet (81 mg total) by mouth daily. 05/12/18   Hosie Poisson, MD  beta carotene w/minerals (OCUVITE) tablet Take 1 tablet by mouth daily.    [provider]  Calcium Carbonate-Vitamin D (CALTRATE 600+D) 600-400 MG-UNIT per tablet Take 2 tablets by mouth daily.     [provider]  Carboxymethylcellul-Glycerin (LUBRICATING EYE DROPS OP) Place 1-2 drops into both eyes 4 (four) times daily as needed (for dry eyes).    [provider]  clopidogrel (PLAVIX) 75 MG tablet Take 1 tablet (75 mg total) by mouth daily. Please contact your neurologist for further refills. Patient not taking: Reported on 06/19/2018 06/02/18   Thompson Grayer, MD  dexlansoprazole (DEXILANT) 60 MG capsule Take 60 mg by mouth daily.      [provider]  dextromethorphan-guaiFENesin (MUCINEX DM) 30-600 MG 12hr tablet Take 1 tablet by mouth 2 (two) times daily as needed for cough.    [provider]  ferrous sulfate 325 (65 FE) MG tablet Take 325 mg by mouth daily with breakfast.    [provider]  furosemide (LASIX) 40 MG tablet Take 40 mg by mouth 2 (two) times daily.    [provider]  gabapentin (NEURONTIN) 300 MG capsule Take 600 mg by mouth 2 (two) times daily.    [provider]  insulin aspart (NOVOLOG FLEXPEN) 100 UNIT/ML FlexPen Inject 10-30 Units into the skin 3 (three) times daily with meals. Per sliding scale     [provider]  Lidocaine-Hydrocortisone Ace 3-0.5 % CREA Apply 1 application topically  daily as needed (for itching).    [provider]  liraglutide (VICTOZA) 18 MG/3ML SOPN Inject 1.8 mg into the skin daily.    [provider]  lisinopril (PRINIVIL,ZESTRIL) 10 MG tablet Take 10 mg by mouth daily.    [provider]  meloxicam (MOBIC) 15 MG tablet Take 15 mg by mouth daily.    [provider]  metoprolol succinate (TOPROL-XL) 25 MG 24 hr tablet Take 1 tablet (25 mg total) by mouth daily. 11/18/17   Jerline Pain, MD  ondansetron (ZOFRAN) 4 MG tablet Take 1 tablet (4 mg total) by mouth every 8 (eight) hours as needed for nausea or vomiting. 05/12/18   Hosie Poisson, MD  PARoxetine (PAXIL) 40 MG tablet Take 80 mg by mouth every morning.     [provider]  polyethylene glycol (MIRALAX /  GLYCOLAX) packet Take 17 g by mouth daily as needed for mild constipation.    [provider]  Probiotic Product (ALIGN) 4 MG CAPS Take 8 mg by mouth daily.    [provider]  simvastatin (ZOCOR) 40 MG tablet Take 40 mg by mouth every evening. 05/01/18   [provider]  Study - [place] - apixaban 5mg  or Placebo Take 2.5 mg by mouth 2 (two) times daily.    [provider]  TRESIBA FLEXTOUCH 200 UNIT/ML SOPN Inject 80 Units into the skin daily. Will need further dose adjustment at Follow up Patient taking differently: Inject 70 Units into the skin daily. Will need further dose adjustment at Follow up 04/18/16   Domenic Polite, MD    Physical Exam: Vitals:   07/26/18 1635 07/26/18 1636 07/26/18 1645 07/26/18 2138  BP:   (!) 142/66 139/63  Pulse:   75 88  Resp:   (!) 23 (!) 21  SpO2: 98%  98% 91%  Weight:  120.2 kg    Height:  4\' 11"  (1.499 m)      Constitutional: NAD, calm  Eyes: PERTLA, lids and conjunctivae normal ENMT: Mucous membranes are moist. Posterior pharynx clear of any exudate or lesions.   Neck: normal, supple, no masses, no thyromegaly Respiratory: clear to auscultation bilaterally, no wheezing, no  crackles. Normal respiratory effort.   Cardiovascular: S1 & S2 heard, regular rate and rhythm. No extremity edema.   Abdomen: Soft. Tender in LLQ. No rebound pain or guarding. Bowel sounds active.  Musculoskeletal: no clubbing / cyanosis. No joint deformity upper and lower extremities. Normal muscle tone.  Skin: Scattered abrasions and superficial ulcerations with crust. Warm, dry, well-perfused. Neurologic: No facial asymmetry. Sensation intact. Moving all extremities.  Psychiatric: Alert and oriented to person, place, and situation. Pleasant and cooperative.     Labs on Admission: I have personally reviewed following labs and imaging studies  CBC: Recent Labs  Lab 07/26/18 1700 07/26/18 1741  WBC 16.3*  --   NEUTROABS 13.5*  --   HGB 11.8* 11.6*  HCT 38.1 34.0*  MCV 93.2  --   PLT 364  --    Basic Metabolic Panel: Recent Labs  Lab 07/26/18 1700 07/26/18 1741  NA 139 139  K 2.7* 3.0*  CL 104 105  CO2 18*  --   GLUCOSE 236* 238*  BUN 21 25*  CREATININE 1.50* 0.90  CALCIUM 8.8*  --    GFR: Estimated Creatinine Clearance: 67.9 mL/min (by C-G formula based on SCr of 0.9 mg/dL). Liver Function Tests: Recent Labs  Lab 07/26/18 1700  AST 8*  ALT 9  ALKPHOS 64  BILITOT 1.4*  PROT 6.6  ALBUMIN 3.0*   Recent Labs  Lab 07/26/18 1700  LIPASE 22   No results for input(s): AMMONIA in the last 168 hours. Coagulation Profile: No results for input(s): INR, PROTIME in the last 168 hours. Cardiac Enzymes: No results for input(s): CKTOTAL, CKMB, CKMBINDEX, TROPONINI in the last 168 hours. BNP (last 3 results) No results for input(s): PROBNP in the last 8760 hours. HbA1C: No results for input(s): HGBA1C in the last 72 hours. CBG: No results for input(s): GLUCAP in the last 168 hours. Lipid Profile: No results for input(s): CHOL, HDL, LDLCALC, TRIG, CHOLHDL, LDLDIRECT in the last 72 hours. Thyroid Function Tests: No results for input(s): TSH, T4TOTAL, FREET4, T3FREE,  THYROIDAB in the last 72 hours. Anemia Panel: No results for input(s): VITAMINB12, FOLATE, FERRITIN, TIBC, IRON, RETICCTPCT in the last  72 hours. Urine analysis:    Component Value Date/Time   COLORURINE YELLOW 05/10/2018 [place] 05/10/2018 1815   LABSPEC 1.015 05/10/2018 1815   PHURINE 7.0 05/10/2018 1815   GLUCOSEU NEGATIVE 05/10/2018 1815   HGBUR NEGATIVE 05/10/2018 1815   BILIRUBINUR NEGATIVE 05/10/2018 1815   KETONESUR NEGATIVE 05/10/2018 1815   PROTEINUR NEGATIVE 05/10/2018 1815   UROBILINOGEN 1.0 05/28/2015 0122   NITRITE NEGATIVE 05/10/2018 1815   LEUKOCYTESUR NEGATIVE 05/10/2018 1815   Sepsis Labs: @LABRCNTIP (procalcitonin:4,lacticidven:4) )No results found for this or any previous visit (from the past 240 hour(s)).   Radiological Exams on Admission: Ct Abdomen Pelvis W Contrast  Result Date: 07/26/2018 CLINICAL DATA:  73 year old female with history of left-sided abdominal pain for 1 week. EXAM: CT ABDOMEN AND PELVIS WITH CONTRAST TECHNIQUE: Multidetector CT imaging of the abdomen and pelvis was performed using the standard protocol following bolus administration of intravenous contrast. CONTRAST:  77mL OMNIPAQUE IOHEXOL 300 MG/ML  SOLN COMPARISON:  CT the abdomen and pelvis 11/01/2015. FINDINGS: Lower chest: Atherosclerotic calcifications in the descending thoracic aorta as well as the left circumflex and right coronary arteries. Calcifications of the aortic valve and mitral annulus. Hepatobiliary: No definite cystic or solid hepatic lesions. Mild intrahepatic biliary ductal dilatation. Common bile duct measures up to 18 mm in the porta hepatis. Both of the previous findings are only slightly increased compared to prior study from 2017. No calcified stone in the common bile duct. Status post cholecystectomy. Pancreas: No pancreatic mass. No pancreatic ductal dilatation. No pancreatic or peripancreatic fluid or inflammatory changes. Spleen: Unremarkable.  Adrenals/Urinary Tract: 1.8 x 1.6 cm right adrenal nodule is indeterminate, but only minimally increased in size compared to prior exam 11/01/2015, statistically likely a benign lesions such as an adenoma. Left adrenal gland is normal in appearance. Multiple low-attenuation lesions in the kidneys bilaterally, compatible with simple cysts, largest of which is exophytic in the lateral aspect of the upper pole of the right kidney measuring 4.2 cm in diameter. There is also a subcentimeter intermediate attenuation lesion in the lower pole of the left kidney, too small to characterize, but stable in size compared to the prior study from 2017, likely a mildly proteinaceous cyst. No hydroureteronephrosis. Urinary bladder is normal in appearance. Stomach/Bowel: Normal appearance of the stomach. No pathologic dilatation of small bowel or colon. Colon is mildly distended with multiple air-fluid levels (nonspecific). Notably, there appears to be pneumatosis involving the cecum, ascending colon and hepatic flexure of the colon. No surrounding inflammatory changes are noted. The appendix is not confidently identified and may be surgically absent. Regardless, there are no inflammatory changes noted adjacent to the cecum to suggest the presence of an acute appendicitis at this time. Vascular/Lymphatic: No portal venous gas. Aortic atherosclerosis, without evidence of aneurysm or dissection in the abdominal or pelvic vasculature. Today's examination was not a CTA exam, however, the celiac axis, superior mesenteric artery, inferior mesenteric artery and their major branches all appear widely patent without hemodynamically significant stenosis. No lymphadenopathy noted in the abdomen or pelvis. Reproductive: Status post hysterectomy. Ovaries are not confidently identified may be surgically absent or atrophic. Other: Small umbilical hernia containing only omental fat. No significant volume of ascites. No pneumoperitoneum.  Musculoskeletal: There are no aggressive appearing lytic or blastic lesions noted in the visualized portions of the skeleton. IMPRESSION: 1. Pneumatosis involving the cecum, ascending colon and hepatic flexure of the colon. No definite source for this pneumatosis is identified on today's examination. No associated portal  venous gas. No major vascular occlusion to account for this finding. Further clinical evaluation is suggested. 2. Mild distention of the colon with multiple air-fluid levels. This is nonspecific. No findings to suggest bowel obstruction at this time. 3. Small umbilical hernia containing only omental fat, without evidence of bowel incarceration or obstruction. 4. Aortic atherosclerosis, in addition to least 2 vessel coronary artery disease. Assessment for potential risk factor modification, dietary therapy or pharmacologic therapy may be warranted, if clinically indicated. 5. Additional incidental findings, as above. Electronically Signed   By: Vinnie Langton M.D.   On: 07/26/2018 20:47    EKG: Independently reviewed. Sinus rhythm, PAC's.   Assessment/Plan  1. Pneumatosis coli  - Presents with several days of abdominal pain, nausea, and vomiting  - Found to have pneumatosis on CT without clear etiology  - Surgery is consulting and much appreciated  - Given IVF, pain-control, antiemetics, and empiric antibiotics in ED  - Continue bowel-rest, pain-control, empiric antibiotics   2. History of CVA  - No acute deficits per patient - Suspected to have a fib on loop recorder and started on Eliquis per patient's report  - Oral medications held on admission, will resume as her condition allows    3. Chronic diastolic CHF  - Appears compensated  - Holding Lasix while NPO, and ACE-i held in light of AKI  - Follow daily weights    4. Acute kidney injury  - SCr is 1.50 on admission  - Likely a prerenal azotemia in setting of loss of appetite with N/V  - Given a liter NS in ED and  continued on NS infusion  - Hold ACE-i and Lasix, renally-dose medications, repeat chem panel in am   5. Hypokalemia  - Serum potassium is 2.7 in ED  - KCl added to IVF, empiric magnesium given  - Repeat chem panel in am    6. Insulin-dependent DM  - A1c was 8.7% in October  - Managed at home with Victoza, Tresiba 70 units qD, and Novolog per sliding-scale  - Check CBG's and use Lantus with Novolog correctional while in hospital     DVT prophylaxis: SCD's; Eliquis pta  Code Status: Full  Family Communication: Daughter updated at bedside Consults called: Surgery Admission status: Observation     Vianne Bulls, MD [facility] Pager [phone]  If 7PM-7AM, please contact night-coverage [email or web address] Password [facility]  07/26/2018, 9:52 PM

## 2018-07-26 NOTE — ED Triage Notes (Signed)
Per [facility], pt from home w/ a c/o left sided abd pain x 1 week. Last BM was on 07/24/18. Pt reports she vomited fecal matter earlier today and believes she has a bowel obstruction. She said the vomit was "charcoal gray w/o sediment." Additional complaints of dizziness and headache. The dizziness has been ongoing "forever" and the headache has been ongoing since she had her stroke (pt cannot report the date of her stroke). Hx of a-fib.  130/70 CBG 274 HR 80-120 95% RA  24 ga IV L hand  4 mg Zofran

## 2018-07-26 NOTE — Consult Note (Signed)
Surgical Consultation Requesting provider: Dr. 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  She has tried a fleets enema about 2 days ago with some relief but continued to experience pain in the left lower abdomen.  It is waxing and waning, severe at times, not associated with melena or hematochezia, no diarrhea.  This is similar to previous episodes of diverticulitis that she has had.  She had a colonoscopy in April 2019 which notes hemorrhoids, diverticulosis, one small mid descending colon polyp; no mention of obstruction; her gastroenterologist is Dr. Watt Climes. She denies any right-sided or upper abdominal pain.  Denies feeling bloated.  Allergies  Allergen Reactions  . Crestor [Rosuvastatin] Other (See Comments)    Myalgias  . Indomethacin Other (See Comments)    dizziness  . Invanz [Ertapenem Sodium] Hives  . Lipitor [Atorvastatin Calcium] Other (See Comments)    Myalgias   . Reglan [Metoclopramide] Other (See Comments)    TREMORS    Past Medical History:  Diagnosis Date  . Anxiety   . Asthma   . CHF (congestive heart failure) ([place])   . Depression   . Diabetes mellitus   . GERD (gastroesophageal reflux disease)   . Heart murmur    echo- 04/2016- mild aortic stenosis  . History of kidney stones    passed  . Hypercholesteremia   . Hypertension   . Kidney stones    "last kidney numbers normal"  . Left ear hearing loss   . Osteoarthritis   . Seizures ([place]) 04/2016   due to Hyperosmolar Nonketotic hyperglycemia- is what it was thought to be cause by. No further seizure activity.  .  Thrombophlebitis     Past Surgical History:  Procedure Laterality Date  . ABDOMINAL HYSTERECTOMY    . APPENDECTOMY    . BREAST BIOPSY Right    many yrs ago per pt; neg bx  . CARPAL TUNNEL RELEASE Bilateral   . CHOLECYSTECTOMY    . COLONOSCOPY    . COLONOSCOPY WITH PROPOFOL N/A 11/03/2017   Procedure: COLONOSCOPY WITH PROPOFOL;  Surgeon: Clarene Essex, MD;  Location: [facility];  Service: Endoscopy;  Laterality: N/A;  . COLONOSCOPY WITH PROPOFOL N/A 11/04/2017   Procedure: COLONOSCOPY WITH PROPOFOL;  Surgeon: Clarene Essex, MD;  Location: [facility];  Service: Endoscopy;  Laterality: N/A;  . ESOPHAGOGASTRODUODENOSCOPY (EGD) WITH PROPOFOL N/A 11/03/2017   Procedure: ESOPHAGOGASTRODUODENOSCOPY (EGD) WITH PROPOFOL;  Surgeon: Clarene Essex, MD;  Location: [facility];  Service: Endoscopy;  Laterality: N/A;  . ESOPHAGOSCOPY    . EYE SURGERY Bilateral    Cataract with lens  . INCISION AND DRAINAGE PERIRECTAL ABSCESS    . LOOP RECORDER INSERTION N/A 05/12/2018   Procedure: LOOP RECORDER INSERTION;  Surgeon: Thompson Grayer, MD;  Location: [facility];  Service: Cardiovascular;  Laterality: N/A;  . pyelogram    . TEE WITHOUT CARDIOVERSION N/A 05/12/2018   Procedure: TRANSESOPHAGEAL ECHOCARDIOGRAM (TEE);  Surgeon: Buford Dresser, MD;  Location[facility] END[facility];  Service: Cardiovascular;  Laterality: N/A;    Family History  Problem Relation Age of Onset  . Heart attack Mother        8 HEART ATTACKS  . Stroke Mother  3 STROKES  . Heart attack Father   . Stroke Father     Social History   Socioeconomic History  . Marital status: Single    Spouse name: Not on file  . Number of children: Not on file  . Years of education: Not on file  . Highest education level: Not on file  Occupational History  . Not on file  Social Needs  . Financial resource strain: Not on file  . Food insecurity:    Worry: Not on file    Inability: Not on file  . Transportation needs:     Medical: Not on file    Non-medical: Not on file  Tobacco Use  . Smoking status: Never Smoker  . Smokeless tobacco: Never Used  Substance and Sexual Activity  . Alcohol use: Not Currently    Frequency: Never    Comment: maybe 1 drink every few months  . Drug use: No  . Sexual activity: Not on file  Lifestyle  . Physical activity:    Days per week: Not on file    Minutes per session: Not on file  . Stress: Not on file  Relationships  . Social connections:    Talks on phone: Not on file    Gets together: Not on file    Attends religious service: Not on file    Active member of club or organization: Not on file    Attends meetings of clubs or organizations: Not on file    Relationship status: Not on file  Other Topics Concern  . Not on file  Social History Narrative  . Not on file    No current facility-administered medications on file prior to encounter.    Current Outpatient Medications on File Prior to Encounter  Medication Sig Dispense Refill  . aspirin EC 81 MG EC tablet Take 1 tablet (81 mg total) by mouth daily. 30 tablet 0  . beta carotene w/minerals (OCUVITE) tablet Take 1 tablet by mouth daily.    . Calcium Carbonate-Vitamin D (CALTRATE 600+D) 600-400 MG-UNIT per tablet Take 2 tablets by mouth daily.     . Carboxymethylcellul-Glycerin (LUBRICATING EYE DROPS OP) Place 1-2 drops into both eyes 4 (four) times daily as needed (for dry eyes).    . clopidogrel (PLAVIX) 75 MG tablet Take 1 tablet (75 mg total) by mouth daily. Please contact your neurologist for further refills. (Patient not taking: Reported on 06/19/2018) 30 tablet 0  . dexlansoprazole (DEXILANT) 60 MG capsule Take 60 mg by mouth daily.      Marland Kitchen dextromethorphan-guaiFENesin (MUCINEX DM) 30-600 MG 12hr tablet Take 1 tablet by mouth 2 (two) times daily as needed for cough.    . ferrous sulfate 325 (65 FE) MG tablet Take 325 mg by mouth daily with breakfast.    . furosemide (LASIX) 40 MG tablet Take 40 mg by mouth  2 (two) times daily.    Marland Kitchen gabapentin (NEURONTIN) 300 MG capsule Take 600 mg by mouth 2 (two) times daily.    . insulin aspart (NOVOLOG FLEXPEN) 100 UNIT/ML FlexPen Inject 10-30 Units into the skin 3 (three) times daily with meals. Per sliding scale     . Lidocaine-Hydrocortisone Ace 3-0.5 % CREA Apply 1 application topically daily as needed (for itching).    . liraglutide (VICTOZA) 18 MG/3ML SOPN Inject 1.8 mg into the skin daily.    Marland Kitchen lisinopril (PRINIVIL,ZESTRIL) 10 MG tablet Take 10 mg by mouth daily.    . meloxicam (MOBIC) 15 MG tablet Take  15 mg by mouth daily.    . metoprolol succinate (TOPROL-XL) 25 MG 24 hr tablet Take 1 tablet (25 mg total) by mouth daily. 90 tablet 3  . ondansetron (ZOFRAN) 4 MG tablet Take 1 tablet (4 mg total) by mouth every 8 (eight) hours as needed for nausea or vomiting. 20 tablet 0  . PARoxetine (PAXIL) 40 MG tablet Take 80 mg by mouth every morning.     . polyethylene glycol (MIRALAX / GLYCOLAX) packet Take 17 g by mouth daily as needed for mild constipation.    . Probiotic Product (ALIGN) 4 MG CAPS Take 8 mg by mouth daily.    . simvastatin (ZOCOR) 40 MG tablet Take 40 mg by mouth every evening.  2  . Study - [place] - apixaban 5mg  or Placebo Take 2.5 mg by mouth 2 (two) times daily.    . TRESIBA FLEXTOUCH 200 UNIT/ML SOPN Inject 80 Units into the skin daily. Will need further dose adjustment at Follow up (Patient taking differently: Inject 70 Units into the skin daily. Will need further dose adjustment at Follow up) 5 pen 2    Review of Systems: a complete, 10pt review of systems was completed with pertinent positives and negatives as documented in the HPI  Physical Exam: Vitals:   07/26/18 1645 07/26/18 2138  BP: (!) 142/66 139/63  Pulse: 75 88  Resp: (!) 23 (!) 21  SpO2: 98% 91%   Gen: Alert, cooperative, appears fatigued and chronically ill Head: normocephalic, atraumatic, edentulous Eyes: extraocular motions intact, anicteric.  Neck: supple  without mass or thyromegaly Chest: unlabored respirations, symmetrical air entry Cardiovascular: RRR, no pedal edema Abdomen: Obese, tender in the left lower greater than upper hemiabdomen, minimally tender along the right hemiabdomen. Extremities: warm, without edema, no deformities Neuro: grossly intact Psych: appropriate mood and affect, normal insight  Skin: warm and dry, multiple superficial skin ulcerations in various stages of healing across arms and legs as well as abdomen   CBC Latest Ref Rng & Units 07/26/2018 07/26/2018 05/11/2018  WBC 4.0 - 10.5 K/uL - 16.3(H) 10.4  Hemoglobin 12.0 - 15.0 g/dL 11.6(L) 11.8(L) 9.8(L)  Hematocrit 36.0 - 46.0 % 34.0(L) 38.1 32.7(L)  Platelets 150 - 400 K/uL - 364 288    CMP Latest Ref Rng & Units 07/26/2018 07/26/2018 05/11/2018  Glucose 70 - 99 mg/dL 238(H) 236(H) -  BUN 8 - 23 mg/dL 25(H) 21 -  Creatinine 0.44 - 1.00 mg/dL 0.90 1.50(H) 1.13(H)  Sodium 135 - 145 mmol/L 139 139 -  Potassium 3.5 - 5.1 mmol/L 3.0(L) 2.7(LL) -  Chloride 98 - 111 mmol/L 105 104 -  CO2 22 - 32 mmol/L - 18(L) -  Calcium 8.9 - 10.3 mg/dL - 8.8(L) -  Total Protein 6.5 - 8.1 g/dL - 6.6 -  Total Bilirubin 0.3 - 1.2 mg/dL - 1.4(H) -  Alkaline Phos 38 - 126 U/L - 64 -  AST 15 - 41 U/L - 8(L) -  ALT 0 - 44 U/L - 9 -    Lab Results  Component Value Date   INR 1.10 05/10/2018   INR 1.07 04/15/2016    Imaging: Ct Abdomen Pelvis W Contrast  Result Date: 07/26/2018 CLINICAL DATA:  73 year old female with history of left-sided abdominal pain for 1 week. EXAM: CT ABDOMEN AND PELVIS WITH CONTRAST TECHNIQUE: Multidetector CT imaging of the abdomen and pelvis was performed using the standard protocol following bolus administration of intravenous contrast. CONTRAST:  42mL OMNIPAQUE IOHEXOL 300 MG/ML  SOLN COMPARISON:  CT the abdomen and pelvis 11/01/2015. FINDINGS: Lower chest: Atherosclerotic calcifications in the descending thoracic aorta as well as the left circumflex and  right coronary arteries. Calcifications of the aortic valve and mitral annulus. Hepatobiliary: No definite cystic or solid hepatic lesions. Mild intrahepatic biliary ductal dilatation. Common bile duct measures up to 18 mm in the porta hepatis. Both of the previous findings are only slightly increased compared to prior study from 2017. No calcified stone in the common bile duct. Status post cholecystectomy. Pancreas: No pancreatic mass. No pancreatic ductal dilatation. No pancreatic or peripancreatic fluid or inflammatory changes. Spleen: Unremarkable. Adrenals/Urinary Tract: 1.8 x 1.6 cm right adrenal nodule is indeterminate, but only minimally increased in size compared to prior exam 11/01/2015, statistically likely a benign lesions such as an adenoma. Left adrenal gland is normal in appearance. Multiple low-attenuation lesions in the kidneys bilaterally, compatible with simple cysts, largest of which is exophytic in the lateral aspect of the upper pole of the right kidney measuring 4.2 cm in diameter. There is also a subcentimeter intermediate attenuation lesion in the lower pole of the left kidney, too small to characterize, but stable in size compared to the prior study from 2017, likely a mildly proteinaceous cyst. No hydroureteronephrosis. Urinary bladder is normal in appearance. Stomach/Bowel: Normal appearance of the stomach. No pathologic dilatation of small bowel or colon. Colon is mildly distended with multiple air-fluid levels (nonspecific). Notably, there appears to be pneumatosis involving the cecum, ascending colon and hepatic flexure of the colon. No surrounding inflammatory changes are noted. The appendix is not confidently identified and may be surgically absent. Regardless, there are no inflammatory changes noted adjacent to the cecum to suggest the presence of an acute appendicitis at this time. Vascular/Lymphatic: No portal venous gas. Aortic atherosclerosis, without evidence of aneurysm or  dissection in the abdominal or pelvic vasculature. Today's examination was not a CTA exam, however, the celiac axis, superior mesenteric artery, inferior mesenteric artery and their major branches all appear widely patent without hemodynamically significant stenosis. No lymphadenopathy noted in the abdomen or pelvis. Reproductive: Status post hysterectomy. Ovaries are not confidently identified may be surgically absent or atrophic. Other: Small umbilical hernia containing only omental fat. No significant volume of ascites. No pneumoperitoneum. Musculoskeletal: There are no aggressive appearing lytic or blastic lesions noted in the visualized portions of the skeleton. IMPRESSION: 1. Pneumatosis involving the cecum, ascending colon and hepatic flexure of the colon. No definite source for this pneumatosis is identified on today's examination. No associated portal venous gas. No major vascular occlusion to account for this finding. Further clinical evaluation is suggested. 2. Mild distention of the colon with multiple air-fluid levels. This is nonspecific. No findings to suggest bowel obstruction at this time. 3. Small umbilical hernia containing only omental fat, without evidence of bowel incarceration or obstruction. 4. Aortic atherosclerosis, in addition to least 2 vessel coronary artery disease. Assessment for potential risk factor modification, dietary therapy or pharmacologic therapy may be warranted, if clinically indicated. 5. Additional incidental findings, as above. Electronically Signed   By: Vinnie Langton M.D.   On: 07/26/2018 20:77    A/P: 73 year old woman with multiple comorbidities, one-week history of nausea, vomiting, left lower quadrant abdominal pain.  CT findings as above notable for diffuse pneumatosis involving the cecum, ascending colon and hepatic flexure.  Interestingly her pain and tenderness is on the opposite side.  There is no apparent portal venous gas or vascular occlusion, and the  clinical significance of the CT finding  is uncertain at this time. 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  Would recommend admission for fluid resuscitation, correction of electrolyte abnormalities, and empiric antibiotic therapy.  Would keep n.p.o. and if vomiting persists Place NG tube.  We will continue to follow closely.  Discussed with the patient that if her clinical picture worsens she may require emergent surgery this admission.  She states that she would like help while she is here in the hospital establishing her advanced directive and healthcare power of attorney.  Her niece who is at the bedside is aware of the patient's wishes should the patient become unable to communicate with the team. The patient also requested Dr. Watt Climes be consulted while she is an inpatient- can be addressed in the morning.   Romana Juniper, MD [facility] Surgery, [place] Pager [phone]

## 2018-07-26 NOTE — ED Notes (Signed)
ED Provider at bedside. 

## 2018-07-26 NOTE — Progress Notes (Signed)
Pharmacy Antibiotic Note  Sharon [name] is a 73 y.o. female admitted on 07/26/2018 with intra-abdominal infection.  Pharmacy has been consulted for Zosyn dosing. SCr 0.9 on admit. Patient currently receiving Zosyn 3.375g IV (37min infusion) x 1 in the ER.  Noted "hives" reaction to ertapenem in the past, but patient is already receiving Zosyn dose in the ER with no reported issues.  Plan: Continue Zosyn 3.375g IV q8h (4h infusion) Monitor clinical progress, c/s, renal function F/u de-escalation plan/LOT   Height: 4\' 11"  (149.9 cm) Weight: 265 lb (120.2 kg) IBW/kg (Calculated) : 43.2  No data recorded.  Recent Labs  Lab 07/26/18 1700 07/26/18 1741 07/26/18 1907  WBC 16.3*  --   --   CREATININE 1.50* 0.90  --   LATICACIDVEN  --   --  1.03    Estimated Creatinine Clearance: 67.9 mL/min (by C-G formula based on SCr of 0.9 mg/dL).    Allergies  Allergen Reactions  . Crestor [Rosuvastatin] Other (See Comments)    Myalgias  . Indomethacin Other (See Comments)    dizziness  . Invanz [Ertapenem Sodium] Hives  . Lipitor [Atorvastatin Calcium] Other (See Comments)    Myalgias   . Reglan [Metoclopramide] Other (See Comments)    TREMORS    Elicia Lamp, PharmD, BCPS Please check AMION for all [place] contact numbers Clinical Pharmacist 07/26/2018 10:02 PM

## 2018-07-27 ENCOUNTER — Observation Stay (HOSPITAL_COMMUNITY): Payer: Medicare Other

## 2018-07-27 DIAGNOSIS — F329 Major depressive disorder, single episode, unspecified: Secondary | ICD-10-CM | POA: Diagnosis not present

## 2018-07-27 DIAGNOSIS — K429 Umbilical hernia without obstruction or gangrene: Secondary | ICD-10-CM | POA: Diagnosis not present

## 2018-07-27 DIAGNOSIS — N183 Chronic kidney disease, stage 3 (moderate): Secondary | ICD-10-CM | POA: Diagnosis present

## 2018-07-27 DIAGNOSIS — K5669 Other partial intestinal obstruction: Secondary | ICD-10-CM | POA: Diagnosis not present

## 2018-07-27 DIAGNOSIS — I5033 Acute on chronic diastolic (congestive) heart failure: Secondary | ICD-10-CM | POA: Diagnosis not present

## 2018-07-27 DIAGNOSIS — Z008 Encounter for other general examination: Secondary | ICD-10-CM | POA: Diagnosis not present

## 2018-07-27 DIAGNOSIS — I480 Paroxysmal atrial fibrillation: Secondary | ICD-10-CM | POA: Diagnosis present

## 2018-07-27 DIAGNOSIS — K560 Paralytic ileus: Secondary | ICD-10-CM | POA: Diagnosis not present

## 2018-07-27 DIAGNOSIS — R0989 Other specified symptoms and signs involving the circulatory and respiratory systems: Secondary | ICD-10-CM | POA: Diagnosis not present

## 2018-07-27 DIAGNOSIS — Z942 Lung transplant status: Secondary | ICD-10-CM | POA: Diagnosis not present

## 2018-07-27 DIAGNOSIS — I2510 Atherosclerotic heart disease of native coronary artery without angina pectoris: Secondary | ICD-10-CM | POA: Diagnosis present

## 2018-07-27 DIAGNOSIS — I350 Nonrheumatic aortic (valve) stenosis: Secondary | ICD-10-CM | POA: Diagnosis not present

## 2018-07-27 DIAGNOSIS — D62 Acute posthemorrhagic anemia: Secondary | ICD-10-CM | POA: Diagnosis not present

## 2018-07-27 DIAGNOSIS — I700 Atherosclerosis of aorta: Secondary | ICD-10-CM

## 2018-07-27 DIAGNOSIS — N281 Cyst of kidney, acquired: Secondary | ICD-10-CM | POA: Diagnosis not present

## 2018-07-27 DIAGNOSIS — K5732 Diverticulitis of large intestine without perforation or abscess without bleeding: Secondary | ICD-10-CM | POA: Diagnosis present

## 2018-07-27 DIAGNOSIS — E876 Hypokalemia: Secondary | ICD-10-CM | POA: Diagnosis present

## 2018-07-27 DIAGNOSIS — K5792 Diverticulitis of intestine, part unspecified, without perforation or abscess without bleeding: Secondary | ICD-10-CM | POA: Diagnosis present

## 2018-07-27 DIAGNOSIS — R1114 Bilious vomiting: Secondary | ICD-10-CM | POA: Diagnosis not present

## 2018-07-27 DIAGNOSIS — E118 Type 2 diabetes mellitus with unspecified complications: Secondary | ICD-10-CM | POA: Diagnosis not present

## 2018-07-27 DIAGNOSIS — N179 Acute kidney failure, unspecified: Secondary | ICD-10-CM | POA: Diagnosis present

## 2018-07-27 DIAGNOSIS — I5032 Chronic diastolic (congestive) heart failure: Secondary | ICD-10-CM | POA: Diagnosis present

## 2018-07-27 DIAGNOSIS — R945 Abnormal results of liver function studies: Secondary | ICD-10-CM | POA: Diagnosis not present

## 2018-07-27 DIAGNOSIS — E872 Acidosis: Secondary | ICD-10-CM | POA: Diagnosis present

## 2018-07-27 DIAGNOSIS — E870 Hyperosmolality and hypernatremia: Secondary | ICD-10-CM | POA: Diagnosis present

## 2018-07-27 DIAGNOSIS — R112 Nausea with vomiting, unspecified: Secondary | ICD-10-CM | POA: Diagnosis not present

## 2018-07-27 DIAGNOSIS — Z6841 Body Mass Index (BMI) 40.0 and over, adult: Secondary | ICD-10-CM | POA: Diagnosis not present

## 2018-07-27 DIAGNOSIS — K56609 Unspecified intestinal obstruction, unspecified as to partial versus complete obstruction: Secondary | ICD-10-CM | POA: Diagnosis not present

## 2018-07-27 DIAGNOSIS — K6389 Other specified diseases of intestine: Secondary | ICD-10-CM | POA: Diagnosis present

## 2018-07-27 DIAGNOSIS — I472 Ventricular tachycardia: Secondary | ICD-10-CM | POA: Diagnosis present

## 2018-07-27 DIAGNOSIS — E1136 Type 2 diabetes mellitus with diabetic cataract: Secondary | ICD-10-CM | POA: Diagnosis present

## 2018-07-27 DIAGNOSIS — N736 Female pelvic peritoneal adhesions (postinfective): Secondary | ICD-10-CM | POA: Diagnosis not present

## 2018-07-27 DIAGNOSIS — Z01818 Encounter for other preprocedural examination: Secondary | ICD-10-CM | POA: Diagnosis not present

## 2018-07-27 DIAGNOSIS — K5730 Diverticulosis of large intestine without perforation or abscess without bleeding: Secondary | ICD-10-CM | POA: Diagnosis not present

## 2018-07-27 DIAGNOSIS — Z7401 Bed confinement status: Secondary | ICD-10-CM | POA: Diagnosis not present

## 2018-07-27 DIAGNOSIS — I4891 Unspecified atrial fibrillation: Secondary | ICD-10-CM | POA: Diagnosis not present

## 2018-07-27 DIAGNOSIS — R509 Fever, unspecified: Secondary | ICD-10-CM | POA: Diagnosis not present

## 2018-07-27 DIAGNOSIS — I4892 Unspecified atrial flutter: Secondary | ICD-10-CM | POA: Diagnosis present

## 2018-07-27 DIAGNOSIS — Z515 Encounter for palliative care: Secondary | ICD-10-CM | POA: Diagnosis present

## 2018-07-27 DIAGNOSIS — K660 Peritoneal adhesions (postprocedural) (postinfection): Secondary | ICD-10-CM | POA: Diagnosis not present

## 2018-07-27 DIAGNOSIS — I10 Essential (primary) hypertension: Secondary | ICD-10-CM | POA: Diagnosis not present

## 2018-07-27 DIAGNOSIS — I639 Cerebral infarction, unspecified: Secondary | ICD-10-CM | POA: Diagnosis not present

## 2018-07-27 DIAGNOSIS — E119 Type 2 diabetes mellitus without complications: Secondary | ICD-10-CM | POA: Diagnosis not present

## 2018-07-27 DIAGNOSIS — K5651 Intestinal adhesions [bands], with partial obstruction: Secondary | ICD-10-CM | POA: Diagnosis not present

## 2018-07-27 DIAGNOSIS — R531 Weakness: Secondary | ICD-10-CM | POA: Diagnosis not present

## 2018-07-27 DIAGNOSIS — R1032 Left lower quadrant pain: Secondary | ICD-10-CM | POA: Diagnosis not present

## 2018-07-27 DIAGNOSIS — Z66 Do not resuscitate: Secondary | ICD-10-CM | POA: Diagnosis present

## 2018-07-27 DIAGNOSIS — K5939 Other megacolon: Secondary | ICD-10-CM | POA: Diagnosis present

## 2018-07-27 DIAGNOSIS — Z7189 Other specified counseling: Secondary | ICD-10-CM | POA: Diagnosis not present

## 2018-07-27 DIAGNOSIS — Z794 Long term (current) use of insulin: Secondary | ICD-10-CM | POA: Diagnosis not present

## 2018-07-27 DIAGNOSIS — Z933 Colostomy status: Secondary | ICD-10-CM | POA: Diagnosis not present

## 2018-07-27 DIAGNOSIS — R140 Abdominal distension (gaseous): Secondary | ICD-10-CM | POA: Diagnosis not present

## 2018-07-27 DIAGNOSIS — K9189 Other postprocedural complications and disorders of digestive system: Secondary | ICD-10-CM | POA: Diagnosis present

## 2018-07-27 DIAGNOSIS — R7989 Other specified abnormal findings of blood chemistry: Secondary | ICD-10-CM

## 2018-07-27 DIAGNOSIS — K56600 Partial intestinal obstruction, unspecified as to cause: Secondary | ICD-10-CM | POA: Diagnosis not present

## 2018-07-27 DIAGNOSIS — K56699 Other intestinal obstruction unspecified as to partial versus complete obstruction: Secondary | ICD-10-CM | POA: Diagnosis not present

## 2018-07-27 DIAGNOSIS — M2550 Pain in unspecified joint: Secondary | ICD-10-CM | POA: Diagnosis not present

## 2018-07-27 DIAGNOSIS — I110 Hypertensive heart disease with heart failure: Secondary | ICD-10-CM | POA: Diagnosis not present

## 2018-07-27 DIAGNOSIS — R933 Abnormal findings on diagnostic imaging of other parts of digestive tract: Secondary | ICD-10-CM | POA: Diagnosis not present

## 2018-07-27 DIAGNOSIS — R0902 Hypoxemia: Secondary | ICD-10-CM | POA: Diagnosis not present

## 2018-07-27 DIAGNOSIS — R109 Unspecified abdominal pain: Secondary | ICD-10-CM | POA: Diagnosis not present

## 2018-07-27 DIAGNOSIS — Z452 Encounter for adjustment and management of vascular access device: Secondary | ICD-10-CM | POA: Diagnosis not present

## 2018-07-27 DIAGNOSIS — I130 Hypertensive heart and chronic kidney disease with heart failure and stage 1 through stage 4 chronic kidney disease, or unspecified chronic kidney disease: Secondary | ICD-10-CM | POA: Diagnosis present

## 2018-07-27 DIAGNOSIS — Z8673 Personal history of transient ischemic attack (TIA), and cerebral infarction without residual deficits: Secondary | ICD-10-CM | POA: Diagnosis not present

## 2018-07-27 LAB — URINALYSIS, ROUTINE W REFLEX MICROSCOPIC
Bacteria, UA: NONE SEEN
Bilirubin Urine: NEGATIVE
Hgb urine dipstick: NEGATIVE
Ketones, ur: 20 mg/dL — AB
Leukocytes, UA: NEGATIVE
Nitrite: NEGATIVE
Protein, ur: NEGATIVE mg/dL
Specific Gravity, Urine: 1.03 (ref 1.005–1.030)
pH: 5 (ref 5.0–8.0)

## 2018-07-27 LAB — CBC WITH DIFFERENTIAL/PLATELET
Abs Immature Granulocytes: 0.07 10*3/uL (ref 0.00–0.07)
BASOS ABS: 0 10*3/uL (ref 0.0–0.1)
Basophils Relative: 0 %
EOS ABS: 0.1 10*3/uL (ref 0.0–0.5)
Eosinophils Relative: 1 %
HCT: 37.4 % (ref 36.0–46.0)
Hemoglobin: 12.1 g/dL (ref 12.0–15.0)
Immature Granulocytes: 1 %
Lymphocytes Relative: 3 %
Lymphs Abs: 0.4 10*3/uL — ABNORMAL LOW (ref 0.7–4.0)
MCH: 29.5 pg (ref 26.0–34.0)
MCHC: 32.4 g/dL (ref 30.0–36.0)
MCV: 91.2 fL (ref 80.0–100.0)
Monocytes Absolute: 0.3 10*3/uL (ref 0.1–1.0)
Monocytes Relative: 2 %
NEUTROS PCT: 93 %
Neutro Abs: 13.7 10*3/uL — ABNORMAL HIGH (ref 1.7–7.7)
PLATELETS: 361 10*3/uL (ref 150–400)
RBC: 4.1 MIL/uL (ref 3.87–5.11)
RDW: 13 % (ref 11.5–15.5)
WBC: 14.6 10*3/uL — ABNORMAL HIGH (ref 4.0–10.5)
nRBC: 0 % (ref 0.0–0.2)

## 2018-07-27 LAB — GLUCOSE, CAPILLARY
GLUCOSE-CAPILLARY: 209 mg/dL — AB (ref 70–99)
GLUCOSE-CAPILLARY: 229 mg/dL — AB (ref 70–99)
Glucose-Capillary: 166 mg/dL — ABNORMAL HIGH (ref 70–99)
Glucose-Capillary: 192 mg/dL — ABNORMAL HIGH (ref 70–99)
Glucose-Capillary: 193 mg/dL — ABNORMAL HIGH (ref 70–99)
Glucose-Capillary: 222 mg/dL — ABNORMAL HIGH (ref 70–99)
Glucose-Capillary: 254 mg/dL — ABNORMAL HIGH (ref 70–99)

## 2018-07-27 LAB — COMPREHENSIVE METABOLIC PANEL
ALBUMIN: 2.9 g/dL — AB (ref 3.5–5.0)
ALT: 61 U/L — ABNORMAL HIGH (ref 0–44)
AST: 145 U/L — ABNORMAL HIGH (ref 15–41)
Alkaline Phosphatase: 166 U/L — ABNORMAL HIGH (ref 38–126)
Anion gap: 14 (ref 5–15)
BILIRUBIN TOTAL: 1.8 mg/dL — AB (ref 0.3–1.2)
BUN: 21 mg/dL (ref 8–23)
CO2: 21 mmol/L — ABNORMAL LOW (ref 22–32)
Calcium: 8.8 mg/dL — ABNORMAL LOW (ref 8.9–10.3)
Chloride: 107 mmol/L (ref 98–111)
Creatinine, Ser: 1.31 mg/dL — ABNORMAL HIGH (ref 0.44–1.00)
GFR calc Af Amer: 48 mL/min — ABNORMAL LOW (ref >60)
GFR calc non Af Amer: 41 mL/min — ABNORMAL LOW (ref >60)
GLUCOSE: 231 mg/dL — AB (ref 70–99)
Potassium: 3 mmol/L — ABNORMAL LOW (ref 3.5–5.1)
SODIUM: 142 mmol/L (ref 135–145)
Total Protein: 6.6 g/dL (ref 6.5–8.1)

## 2018-07-27 LAB — LACTIC ACID, PLASMA: Lactic Acid, Venous: 1.4 mmol/L (ref 0.5–1.9)

## 2018-07-27 LAB — MAGNESIUM: Magnesium: 2.5 mg/dL — ABNORMAL HIGH (ref 1.7–2.4)

## 2018-07-27 MED ORDER — POTASSIUM CHLORIDE IN NACL 40-0.9 MEQ/L-% IV SOLN
INTRAVENOUS | Status: DC
  Administered 2018-07-27 – 2018-07-28 (×2): 125 mL/h via INTRAVENOUS
  Filled 2018-07-27 (×3): qty 1000

## 2018-07-27 NOTE — Progress Notes (Signed)
   07/27/18 1000  Clinical Encounter Type  Visited With Patient and family together  Visit Type Initial;Other (Comment) (AD request)  Referral From Nurse  Consult/Referral To Nurse  Spiritual Encounters  Spiritual Needs Emotional;Literature  Stress Factors  Patient Stress Factors Loss of control;Health changes  Family Stress Factors None identified   Met w/ pt in response to [facility] Consult request for AD.  Reviewed forms w/ pt, then they were signed and notarized.  Original and 2 copies given to niece present in the room at the pt's request (niece was the HCPOA named).  Another copy placed in pt's shadow chart. Pt also wants to be DNR and I suggested she may also want to talk to the dr about MOST since she was adamant about being DNR.  Gave blank MOST form to her and told care RN Jearld Fenton that pt wanted to take steps to be made DNR and to discuss MOST.  Myra Gianotti resident, [phone]

## 2018-07-27 NOTE — Progress Notes (Signed)
While trying to insert NGT, patient kept on yelling and crying , pulling the RN's hand away. Unable to insert NGT.

## 2018-07-27 NOTE — Consult Note (Signed)
Referring Provider:  [facility] Primary Care Physician:  [name], MD Primary Gastroenterologist:  Dr. Watt Climes  Reason for Consultation: Abdominal pain, pneumatosis  HPI: Sharon [name] is a 73 y.o. female with past medical history of diabetes, GERD, history of CVA with possible atrial fibrillation and history of diastolic CHF presented to the hospital with abdominal pain, nausea and vomiting of few days duration.  According to patient, she was doing fine until 2 days ago when she started having constipation.  No significant improvement in constipation with Fleet enema but she started having abdominal pain, nausea and vomiting following that.  Denies any blood in the stool or black stool.  Denies any blood in the vomiting.  On initial evaluation in the emergency room, she was found to have leukocytosis.  CT abdomen pelvis with IV contrast showed pneumatosis involving cecum, ascending colon and hepatic flexure of the colon without any surrounding inflammatory changes.  No associated portal venous gas.  No major vascular occlusion.  No evidence of bowel obstruction.  Anoscopy in April 2019 showed  small polyps, diverticulosis and hemorrhoids.   Past Medical History:  Diagnosis Date  . Anxiety   . Asthma   . CHF (congestive heart failure) ([place])   . Depression   . Diabetes mellitus   . GERD (gastroesophageal reflux disease)   . Heart murmur    echo- 04/2016- mild aortic stenosis  . History of kidney stones    passed  . Hypercholesteremia   . Hypertension   . Kidney stones    "last kidney numbers normal"  . Left ear hearing loss   . Osteoarthritis   . Seizures ([place]) 04/2016   due to Hyperosmolar Nonketotic hyperglycemia- is what it was thought to be cause by. No further seizure activity.  . Thrombophlebitis     Past Surgical History:  Procedure Laterality Date  . ABDOMINAL HYSTERECTOMY    . APPENDECTOMY    . BREAST BIOPSY Right    many yrs ago per pt; neg bx  . CARPAL TUNNEL RELEASE  Bilateral   . CHOLECYSTECTOMY    . COLONOSCOPY    . COLONOSCOPY WITH PROPOFOL N/A 11/03/2017   Procedure: COLONOSCOPY WITH PROPOFOL;  Surgeon: Clarene Essex, MD;  Location: [place];  Service: Endoscopy;  Laterality: N/A;  . COLONOSCOPY WITH PROPOFOL N/A 11/04/2017   Procedure: COLONOSCOPY WITH PROPOFOL;  Surgeon: Clarene Essex, MD;  Location: [place];  Service: Endoscopy;  Laterality: N/A;  . ESOPHAGOGASTRODUODENOSCOPY (EGD) WITH PROPOFOL N/A 11/03/2017   Procedure: ESOPHAGOGASTRODUODENOSCOPY (EGD) WITH PROPOFOL;  Surgeon: Clarene Essex, MD;  Location: [place];  Service: Endoscopy;  Laterality: N/A;  . ESOPHAGOSCOPY    . EYE SURGERY Bilateral    Cataract with lens  . INCISION AND DRAINAGE PERIRECTAL ABSCESS    . LOOP RECORDER INSERTION N/A 05/12/2018   Procedure: LOOP RECORDER INSERTION;  Surgeon: Thompson Grayer, MD;  Location: [place] [facility];  Service: Cardiovascular;  Laterality: N/A;  . pyelogram    . TEE WITHOUT CARDIOVERSION N/A 05/12/2018   Procedure: TRANSESOPHAGEAL ECHOCARDIOGRAM (TEE);  Surgeon: Buford Dresser, MD;  Location: [facility] ENDOSCOPY;  Service: Cardiovascular;  Laterality: N/A;    Prior to Admission medications   Medication Sig Start Date End Date Taking? Authorizing Provider  aspirin EC 81 MG EC tablet Take 1 tablet (81 mg total) by mouth daily. 05/12/18  Yes Hosie Poisson, MD  Calcium Carbonate-Vitamin D (CALTRATE 600+D) 600-400 MG-UNIT per tablet Take 2 tablets by mouth daily.    Yes [provider]  Carboxymethylcellul-Glycerin (  LUBRICATING EYE DROPS OP) Place 1-2 drops into both eyes 4 (four) times daily as needed (for dry eyes).   Yes [provider]  dexlansoprazole (DEXILANT) 60 MG capsule Take 60 mg by mouth daily.     Yes [provider]  dextromethorphan-guaiFENesin (MUCINEX DM) 30-600 MG 12hr tablet Take 1 tablet by mouth 2 (two) times daily as needed for cough.   Yes [provider]  ferrous sulfate 325 (65 FE)  MG tablet Take 325 mg by mouth daily with breakfast.   Yes [provider]  furosemide (LASIX) 40 MG tablet Take 40 mg by mouth 2 (two) times daily.   Yes [provider]  gabapentin (NEURONTIN) 300 MG capsule Take 600 mg by mouth 2 (two) times daily.   Yes [provider]  insulin aspart (NOVOLOG FLEXPEN) 100 UNIT/ML FlexPen Inject 10-30 Units into the skin 3 (three) times daily with meals. Per sliding scale    Yes [provider]  Lidocaine-Hydrocortisone Ace 3-0.5 % CREA Apply 1 application topically daily as needed (for itching).   Yes [provider]  liraglutide (VICTOZA) 18 MG/3ML SOPN Inject 1.8 mg into the skin daily.   Yes [provider]  lisinopril (PRINIVIL,ZESTRIL) 10 MG tablet Take 10 mg by mouth daily.   Yes [provider]  meloxicam (MOBIC) 15 MG tablet Take 15 mg by mouth daily.   Yes [provider]  metoprolol succinate (TOPROL-XL) 25 MG 24 hr tablet Take 1 tablet (25 mg total) by mouth daily. 11/18/17  Yes Jerline Pain, MD  ondansetron (ZOFRAN) 4 MG tablet Take 1 tablet (4 mg total) by mouth every 8 (eight) hours as needed for nausea or vomiting. 05/12/18  Yes Hosie Poisson, MD  PARoxetine (PAXIL) 40 MG tablet Take 80 mg by mouth every morning.    Yes [provider]  polyethylene glycol (MIRALAX / GLYCOLAX) packet Take 17 g by mouth daily as needed for mild constipation.   Yes [provider]  Probiotic Product (ALIGN) 4 MG CAPS Take 8 mg by mouth daily.   Yes [provider]  simvastatin (ZOCOR) 40 MG tablet Take 40 mg by mouth every evening. 05/01/18  Yes [provider]  Study - [facility] - apixaban 5mg  or Placebo Take 2.5 mg by mouth 2 (two) times daily.   Yes [provider]  TRESIBA FLEXTOUCH 200 UNIT/ML SOPN Inject 80 Units into the skin daily. Will need further dose adjustment at Follow up 04/18/16  Yes Domenic Polite, MD  beta carotene w/minerals (OCUVITE)  tablet Take 1 tablet by mouth daily.    [provider]  clopidogrel (PLAVIX) 75 MG tablet Take 1 tablet (75 mg total) by mouth daily. Please contact your neurologist for further refills. Patient not taking: Reported on 06/19/2018 06/02/18   Thompson Grayer, MD    Scheduled Meds: . insulin aspart  0-9 Units Subcutaneous Q4H  . insulin glargine  35 Units Subcutaneous Daily   Continuous Infusions: . sodium chloride Stopped (07/27/18 0403)  . 0.9 % NaCl with KCl 40 mEq / L 125 mL/hr (07/27/18 1506)  . piperacillin-tazobactam (ZOSYN)  IV 3.375 g (07/27/18 1109)   PRN Meds:.sodium chloride, acetaminophen **OR** acetaminophen, morphine injection, ondansetron **OR** ondansetron (ZOFRAN) IV  Allergies as of 07/26/2018 - Review Complete 07/26/2018  Allergen Reaction Noted  . Crestor [rosuvastatin] Other (See Comments) 08/22/2013  . Indomethacin Other (See Comments) 12/13/2010  . Invanz [ertapenem sodium] Hives 07/20/2011  . Lipitor [atorvastatin calcium] Other (See Comments) 12/13/2010  .  Reglan [metoclopramide] Other (See Comments) 11/27/2015    Family History  Problem Relation Age of Onset  . Heart attack Mother        8 HEART ATTACKS  . Stroke Mother        3 STROKES  . Heart attack Father   . Stroke Father     Social History   Socioeconomic History  . Marital status: Single    Spouse name: Not on file  . Number of children: Not on file  . Years of education: Not on file  . Highest education level: Not on file  Occupational History  . Not on file  Social Needs  . Financial resource strain: Not on file  . Food insecurity:    Worry: Not on file    Inability: Not on file  . Transportation needs:    Medical: Not on file    Non-medical: Not on file  Tobacco Use  . Smoking status: Never Smoker  . Smokeless tobacco: Never Used  Substance and Sexual Activity  . Alcohol use: Not Currently    Frequency: Never    Comment: maybe 1 drink every few months  . Drug use: No   . Sexual activity: Not on file  Lifestyle  . Physical activity:    Days per week: Not on file    Minutes per session: Not on file  . Stress: Not on file  Relationships  . Social connections:    Talks on phone: Not on file    Gets together: Not on file    Attends religious service: Not on file    Active member of club or organization: Not on file    Attends meetings of clubs or organizations: Not on file    Relationship status: Not on file  . Intimate partner violence:    Fear of current or ex partner: Not on file    Emotionally abused: Not on file    Physically abused: Not on file    Forced sexual activity: Not on file  Other Topics Concern  . Not on file  Social History Narrative  . Not on file    Review of Systems: Review of Systems  Constitutional: Positive for fever. Negative for weight loss.  HENT: Negative for hearing loss and tinnitus.   Eyes: Negative for blurred vision and double vision.  Respiratory: Negative for cough, hemoptysis and sputum production.   Cardiovascular: Positive for palpitations. Negative for chest pain.  Gastrointestinal: Positive for abdominal pain, constipation, heartburn, nausea and vomiting. Negative for blood in stool and melena.  Genitourinary: Negative for dysuria and urgency.  Musculoskeletal: Positive for back pain, joint pain, myalgias and neck pain.  Skin: Negative for itching and rash.  Neurological: Positive for dizziness. Negative for seizures and loss of consciousness.  Endo/Heme/Allergies: Does not bruise/bleed easily.  Psychiatric/Behavioral: Negative for hallucinations and suicidal ideas.    Physical Exam: Vital signs: Vitals:   07/27/18 0621 07/27/18 1347  BP:  138/70  Pulse:  76  Resp:  18  Temp: 100 F (37.8 C) (!) 97.2 F (36.2 C)  SpO2: 94% 97%   Last BM Date: 07/24/18 Physical Exam  Constitutional: She is oriented to person, place, and time. She appears well-developed and well-nourished. No distress.  HENT:   Head: Normocephalic and atraumatic.  Eyes: EOM are normal.  Neck: Normal range of motion. Neck supple.  Cardiovascular: Normal rate, regular rhythm and normal heart sounds.  Pulmonary/Chest: Effort normal.  Abdominal: Soft. Bowel sounds are normal. She exhibits  distension. There is abdominal tenderness. There is guarding. There is no rebound.  Significant left lower quadrant tenderness with guarding.  No rebound.  Musculoskeletal: Normal range of motion.        General: Edema present.  Neurological: She is alert and oriented to person, place, and time.  Skin: Skin is warm. No erythema.  Psychiatric: She has a normal mood and affect. Judgment normal.  Vitals reviewed.  GI:  Lab Results: Recent Labs    07/26/18 1700 07/26/18 1741 07/27/18 0339  WBC 16.3*  --  14.6*  HGB 11.8* 11.6* 12.1  HCT 38.1 34.0* 37.4  PLT 364  --  361   BMET Recent Labs    07/26/18 1700 07/26/18 1741 07/27/18 0339  NA 139 139 142  K 2.7* 3.0* 3.0*  CL 104 105 107  CO2 18*  --  21*  GLUCOSE 236* 238* 231*  BUN 21 25* 21  CREATININE 1.50* 0.90 1.31*  CALCIUM 8.8*  --  8.8*   LFT Recent Labs    07/27/18 0339  PROT 6.6  ALBUMIN 2.9*  AST 145*  ALT 61*  ALKPHOS 166*  BILITOT 1.8*   PT/INR No results for input(s): LABPROT, INR in the last 72 hours.   Studies/Results: Ct Abdomen Pelvis W Contrast  Result Date: 07/26/2018 CLINICAL DATA:  73 year old female with history of left-sided abdominal pain for 1 week. EXAM: CT ABDOMEN AND PELVIS WITH CONTRAST TECHNIQUE: Multidetector CT imaging of the abdomen and pelvis was performed using the standard protocol following bolus administration of intravenous contrast. CONTRAST:  43mL OMNIPAQUE IOHEXOL 300 MG/ML  SOLN COMPARISON:  CT the abdomen and pelvis 11/01/2015. FINDINGS: Lower chest: Atherosclerotic calcifications in the descending thoracic aorta as well as the left circumflex and right coronary arteries. Calcifications of the aortic valve and  mitral annulus. Hepatobiliary: No definite cystic or solid hepatic lesions. Mild intrahepatic biliary ductal dilatation. Common bile duct measures up to 18 mm in the porta hepatis. Both of the previous findings are only slightly increased compared to prior study from 2017. No calcified stone in the common bile duct. Status post cholecystectomy. Pancreas: No pancreatic mass. No pancreatic ductal dilatation. No pancreatic or peripancreatic fluid or inflammatory changes. Spleen: Unremarkable. Adrenals/Urinary Tract: 1.8 x 1.6 cm right adrenal nodule is indeterminate, but only minimally increased in size compared to prior exam 11/01/2015, statistically likely a benign lesions such as an adenoma. Left adrenal gland is normal in appearance. Multiple low-attenuation lesions in the kidneys bilaterally, compatible with simple cysts, largest of which is exophytic in the lateral aspect of the upper pole of the right kidney measuring 4.2 cm in diameter. There is also a subcentimeter intermediate attenuation lesion in the lower pole of the left kidney, too small to characterize, but stable in size compared to the prior study from 2017, likely a mildly proteinaceous cyst. No hydroureteronephrosis. Urinary bladder is normal in appearance. Stomach/Bowel: Normal appearance of the stomach. No pathologic dilatation of small bowel or colon. Colon is mildly distended with multiple air-fluid levels (nonspecific). Notably, there appears to be pneumatosis involving the cecum, ascending colon and hepatic flexure of the colon. No surrounding inflammatory changes are noted. The appendix is not confidently identified and may be surgically absent. Regardless, there are no inflammatory changes noted adjacent to the cecum to suggest the presence of an acute appendicitis at this time. Vascular/Lymphatic: No portal venous gas. Aortic atherosclerosis, without evidence of aneurysm or dissection in the abdominal or pelvic vasculature. Today's  examination was not a  CTA exam, however, the celiac axis, superior mesenteric artery, inferior mesenteric artery and their major branches all appear widely patent without hemodynamically significant stenosis. No lymphadenopathy noted in the abdomen or pelvis. Reproductive: Status post hysterectomy. Ovaries are not confidently identified may be surgically absent or atrophic. Other: Small umbilical hernia containing only omental fat. No significant volume of ascites. No pneumoperitoneum. Musculoskeletal: There are no aggressive appearing lytic or blastic lesions noted in the visualized portions of the skeleton. IMPRESSION: 1. Pneumatosis involving the cecum, ascending colon and hepatic flexure of the colon. No definite source for this pneumatosis is identified on today's examination. No associated portal venous gas. No major vascular occlusion to account for this finding. Further clinical evaluation is suggested. 2. Mild distention of the colon with multiple air-fluid levels. This is nonspecific. No findings to suggest bowel obstruction at this time. 3. Small umbilical hernia containing only omental fat, without evidence of bowel incarceration or obstruction. 4. Aortic atherosclerosis, in addition to least 2 vessel coronary artery disease. Assessment for potential risk factor modification, dietary therapy or pharmacologic therapy may be warranted, if clinically indicated. 5. Additional incidental findings, as above. Electronically Signed   By: Vinnie Langton M.D.   On: 07/26/2018 20:47   Dg Chest Port 1 View  Result Date: 07/27/2018 CLINICAL DATA:  Fever today. EXAM: PORTABLE CHEST 1 VIEW COMPARISON:  Single-view of the chest 05/10/2018 and 04/15/2016. CT chest 11/28/2015. FINDINGS: Chronically low lung volumes again seen. There is cardiomegaly and aortic atherosclerosis. No consolidative process, pneumothorax or effusion. No acute bony abnormality. IMPRESSION: No acute disease. Cardiomegaly. Atherosclerosis.  Electronically Signed   By: Inge Rise M.D.   On: 07/27/2018 13:51    Impression/Plan: -Left-sided abdominal pain with CT scan showing pneumatosis involving cecum, ascending colon and hepatic flexure.  Patient has mild elevated LFTs as well as mild elevation in kidney function.  Possibility of small ischemic event involving small bowel and right colon cannot be ruled out.  Recent CT scan suggests no evidence of major vascular occlusion. - Nausea and Vomiting  Recommendations: ------------------------- -Continue antibiotics  -Recommend NG tube placement if persistent nausea and vomiting.  This was discussed with patient, patient's family as well as nursing staff. -Recheck CBC and CMP in the morning. -Consider repeat CT scan with angiogram ( CTA) if no improvement in symptoms -Keep n.p.o. for now. - GI will follow.    LOS: 0 days   Otis Brace  MD, FACP 07/27/2018, 3:08 PM  Contact #  [phone]

## 2018-07-27 NOTE — Social Work (Signed)
For advanced directives please consult chaplain/spiritual care services.  Westley Hummer, MSW, [place] Work [phone]

## 2018-07-27 NOTE — Progress Notes (Signed)
PROGRESS NOTE  Sharon [name] ZOX:[id number] DOB: 28-Nov-1945 DOA: 07/26/2018 PCP: [name], MD  Brief History   73 year old woman PMH chronic diastolic CHF, diabetes mellitus, CVA, atrial fibrillation seen on loop recorder reportedly, presented with abdominal pain.  CT showed pneumatosis of the colon of unclear etiology.  Admitted for further evaluation and management by general surgery.  A & P  Colonic pneumatosis of unclear etiology with ongoing abdominal pain, other findings on CT included multiple air-fluid levels in the colon and a small umbilical hernia --Continue management as per general surgery --Given fever last night, will continue antibiotics at this point.  She has no pulmonary symptoms.  Unclear why the patient was started on oxygen.  Wean oxygen.  Follow-up chest x-ray ordered by general surgery --GI consult requested by general surgery  Elevated LFTs --Etiology unclear.  Check CMP in a.m.  AKI versus CKD stage III.  Suspect at baseline.  Monitor clinically.  Diabetes mellitus type 2 on insulin --Continue with stable.  Continue Lantus, sliding scale insulin.  Chronic diastolic CHF --Appears euvolemic.  PMH stroke --Continue apixaban when able  Aortic atherosclerosis, two-vessel CAD by CT   Patient remains n.p.o. with ongoing abdominal pain and now elevated LFTs and one episode of fever.  She needs ongoing serial exams and management by general surgery as well as GI consultation.  DVT prophylaxis: SCDs Code Status: Full Family Communication: niece/POA at bedside  Disposition Plan: likely home    Murray Hodgkins, MD  [facility] Direct contact: see [email or web address]  7PM-7AM contact night coverage as above 07/27/2018, 1:23 PM  LOS: 0 days   Consultants  . General surgery  Procedures  .   Antibiotics  . Zosyn 1/13 >  Interval History/Subjective  Feels somewhat better today with less abdominal pain although still significant.  Breathing fine.   No other complaints.  Objective   Vitals:  Vitals:   07/27/18 0522 07/27/18 0621  BP: (!) 144/70   Pulse: (!) 108   Resp: (!) 26   Temp: (!) 102 F (38.9 C) 100 F (37.8 C)  SpO2: 91% 94%    Exam:  Constitutional:  . Appears calm, uncomfortable, nontoxic Eyes:  . pupils and irises appear normal ENMT:  . grossly normal hearing  Respiratory:  . CTA bilaterally, no w/r/r.  . Respiratory effort normal.  Cardiovascular:  . RRR, no m/r/g . No LE extremity edema   Abdomen:  . Obese, soft, moderate left lower quadrant pain to gentle palpation Psychiatric:  . Mental status o Mood, affect appropriate   I have personally reviewed the following:   Today's Data  . CBG stable . Potassium 3.0 . Creatinine 1.31 . AST and LAT, total bilirubin and alkaline phosphatase elevated . No significant change WBC of 14.6  Lab Data  .   Micro Data  .   Imaging  . CT abdomen pelvis noted  Cardiology Data  . EKG: Sinus rhythm, PACs  Other Data  .   Scheduled Meds: . insulin aspart  0-9 Units Subcutaneous Q4H  . insulin glargine  35 Units Subcutaneous Daily   Continuous Infusions: . sodium chloride Stopped (07/27/18 0403)  . 0.9 % NaCl with KCl 40 mEq / L    . piperacillin-tazobactam (ZOSYN)  IV 3.375 g (07/27/18 1109)    Principal Problem:   Pneumatosis intestinalis Active Problems:   Type 2 diabetes mellitus without complication (HCC)   Hypokalemia   Diabetes mellitus type 2, controlled (HCC)   Chronic diastolic CHF (congestive heart failure) (  [place])   AKI (acute kidney injury) ([place])   CVA (cerebral vascular accident) ([place])   Elevated LFTs   Aortic atherosclerosis ([place])   LOS: 0 days

## 2018-07-27 NOTE — Progress Notes (Signed)
Elevated patient's temp. At 102F oral, encouraged IS use and administered PRN medication Tylenol and temp. went down to 100F oral and further encourage patient to use IS while awake.  Will endorse appropriately to day shift RN.

## 2018-07-27 NOTE — Progress Notes (Signed)
Patient ID: Sharon [name], female   DOB: [birth year], 73 y.o.   MRN: [id number]       Subjective: Reports LLQ pain this morning and overnight with some nausea. No emesis since last night. Passing flatus. Had a fever overnight which is new. Has not been using IS or mobilizing.   Objective: Vital signs in last 24 hours: Temp:  [98.3 F (36.8 C)-102 F (38.9 C)] 100 F (37.8 C) (01/14 0621) Pulse Rate:  [73-108] 108 (01/14 0522) Resp:  [16-26] 26 (01/14 0522) BP: (138-161)/(46-70) 144/70 (01/14 0522) SpO2:  [91 %-98 %] 94 % (01/14 0621) Weight:  [115.4 kg-120.2 kg] 115.4 kg (01/14 0500) Last BM Date: 07/24/18  Intake/Output from previous day: 01/13 0701 - 01/14 0700 In: 1746.5 [P.O.:30; I.V.:581.8; IV Piggyback:1134.7] Out: 250 [Urine:250] Intake/Output this shift: No intake/output data recorded.  PE: Heart: RRR Lungs: CTA b/l, normal effort Abd: Soft, non-distended. Tenderness in the LLQ. No r/r/g. Scabs over abdomen patient states is from "picking". Decreased BS.   Lab Results:  Recent Labs    07/26/18 1700 07/26/18 1741 07/27/18 0339  WBC 16.3*  --  14.6*  HGB 11.8* 11.6* 12.1  HCT 38.1 34.0* 37.4  PLT 364  --  361   BMET Recent Labs    07/26/18 1700 07/26/18 1741 07/27/18 0339  NA 139 139 142  K 2.7* 3.0* 3.0*  CL 104 105 107  CO2 18*  --  21*  GLUCOSE 236* 238* 231*  BUN 21 25* 21  CREATININE 1.50* 0.90 1.31*  CALCIUM 8.8*  --  8.8*   PT/INR No results for input(s): LABPROT, INR in the last 72 hours. CMP     Component Value Date/Time   NA 142 07/27/2018 0339   K 3.0 (L) 07/27/2018 0339   CL 107 07/27/2018 0339   CO2 21 (L) 07/27/2018 0339   GLUCOSE 231 (H) 07/27/2018 0339   BUN 21 07/27/2018 0339   CREATININE 1.31 (H) 07/27/2018 0339   CALCIUM 8.8 (L) 07/27/2018 0339   PROT 6.6 07/27/2018 0339   ALBUMIN 2.9 (L) 07/27/2018 0339   AST 145 (H) 07/27/2018 0339   ALT 61 (H) 07/27/2018 0339   ALKPHOS 166 (H) 07/27/2018 0339   BILITOT 1.8 (H)  07/27/2018 0339   GFRNONAA 41 (L) 07/27/2018 0339   GFRAA 48 (L) 07/27/2018 0339   Lipase     Component Value Date/Time   LIPASE 22 07/26/2018 1700       Studies/Results: Ct Abdomen Pelvis W Contrast  Result Date: 07/26/2018 CLINICAL DATA:  73 year old female with history of left-sided abdominal pain for 1 week. EXAM: CT ABDOMEN AND PELVIS WITH CONTRAST TECHNIQUE: Multidetector CT imaging of the abdomen and pelvis was performed using the standard protocol following bolus administration of intravenous contrast. CONTRAST:  14mL OMNIPAQUE IOHEXOL 300 MG/ML  SOLN COMPARISON:  CT the abdomen and pelvis 11/01/2015. FINDINGS: Lower chest: Atherosclerotic calcifications in the descending thoracic aorta as well as the left circumflex and right coronary arteries. Calcifications of the aortic valve and mitral annulus. Hepatobiliary: No definite cystic or solid hepatic lesions. Mild intrahepatic biliary ductal dilatation. Common bile duct measures up to 18 mm in the porta hepatis. Both of the previous findings are only slightly increased compared to prior study from 2017. No calcified stone in the common bile duct. Status post cholecystectomy. Pancreas: No pancreatic mass. No pancreatic ductal dilatation. No pancreatic or peripancreatic fluid or inflammatory changes. Spleen: Unremarkable. Adrenals/Urinary Tract: 1.8 x 1.6 cm right adrenal nodule is indeterminate, but  only minimally increased in size compared to prior exam 11/01/2015, statistically likely a benign lesions such as an adenoma. Left adrenal gland is normal in appearance. Multiple low-attenuation lesions in the kidneys bilaterally, compatible with simple cysts, largest of which is exophytic in the lateral aspect of the upper pole of the right kidney measuring 4.2 cm in diameter. There is also a subcentimeter intermediate attenuation lesion in the lower pole of the left kidney, too small to characterize, but stable in size compared to the prior study  from 2017, likely a mildly proteinaceous cyst. No hydroureteronephrosis. Urinary bladder is normal in appearance. Stomach/Bowel: Normal appearance of the stomach. No pathologic dilatation of small bowel or colon. Colon is mildly distended with multiple air-fluid levels (nonspecific). Notably, there appears to be pneumatosis involving the cecum, ascending colon and hepatic flexure of the colon. No surrounding inflammatory changes are noted. The appendix is not confidently identified and may be surgically absent. Regardless, there are no inflammatory changes noted adjacent to the cecum to suggest the presence of an acute appendicitis at this time. Vascular/Lymphatic: No portal venous gas. Aortic atherosclerosis, without evidence of aneurysm or dissection in the abdominal or pelvic vasculature. Today's examination was not a CTA exam, however, the celiac axis, superior mesenteric artery, inferior mesenteric artery and their major branches all appear widely patent without hemodynamically significant stenosis. No lymphadenopathy noted in the abdomen or pelvis. Reproductive: Status post hysterectomy. Ovaries are not confidently identified may be surgically absent or atrophic. Other: Small umbilical hernia containing only omental fat. No significant volume of ascites. No pneumoperitoneum. Musculoskeletal: There are no aggressive appearing lytic or blastic lesions noted in the visualized portions of the skeleton. IMPRESSION: 1. Pneumatosis involving the cecum, ascending colon and hepatic flexure of the colon. No definite source for this pneumatosis is identified on today's examination. No associated portal venous gas. No major vascular occlusion to account for this finding. Further clinical evaluation is suggested. 2. Mild distention of the colon with multiple air-fluid levels. This is nonspecific. No findings to suggest bowel obstruction at this time. 3. Small umbilical hernia containing only omental fat, without evidence  of bowel incarceration or obstruction. 4. Aortic atherosclerosis, in addition to least 2 vessel coronary artery disease. Assessment for potential risk factor modification, dietary therapy or pharmacologic therapy may be warranted, if clinically indicated. 5. Additional incidental findings, as above. Electronically Signed   By: Vinnie Langton M.D.   On: 07/26/2018 20:47    Anti-infectives: Anti-infectives (From admission, onward)   Start     Dose/Rate Route Frequency Ordered Stop   07/27/18 0400  piperacillin-tazobactam (ZOSYN) IVPB 3.375 g     3.375 g 12.5 mL/hr over 240 Minutes Intravenous Every 8 hours 07/26/18 2203     07/26/18 2130  piperacillin-tazobactam (ZOSYN) IVPB 3.375 g     3.375 g 100 mL/hr over 30 Minutes Intravenous  Once 07/26/18 2129 07/26/18 2218       Assessment/Plan  Hx of CVA  HTN HLD Seizures CHF DM  Abdominal pain, uncertain etiology -CT with pneumatosis involving the cecum, ascending colon and hepatic flexure of the colon. Pain is on the opposite side interestingly. Possible sigmoid stricture causing partial obstruction and pain.  -Fever overnight in setting of vomiting. Could be aspiration. Would recommend CXR. -Place NG tube if begins vomiting -Continue IV abx  -Patient reports taking Eliquis for a trial study at home. Not listed on home meds.  -Mobilize -IS -Recommend GI consultation.  -We will continue to follow. No acute surgical intervention  at this time.   FEN - NPO except ice chips VTE - SCD, per medicine ID - Zosyn 1/13 >   LOS: 0 days    Jillyn Ledger , [facility] 07/27/2018, 7:53 AM Pager: [phone]

## 2018-07-27 NOTE — Progress Notes (Signed)
Did bladder scan, varying amouts, did I/O cath and obtained 750 golden concentrated urine, sent UA and culture to lab.  Applied sacral foam.

## 2018-07-28 ENCOUNTER — Inpatient Hospital Stay (HOSPITAL_COMMUNITY): Payer: Medicare Other

## 2018-07-28 LAB — CBC
HCT: 31.5 % — ABNORMAL LOW (ref 36.0–46.0)
Hemoglobin: 10.1 g/dL — ABNORMAL LOW (ref 12.0–15.0)
MCH: 29.5 pg (ref 26.0–34.0)
MCHC: 32.1 g/dL (ref 30.0–36.0)
MCV: 92.1 fL (ref 80.0–100.0)
Platelets: 344 10*3/uL (ref 150–400)
RBC: 3.42 MIL/uL — ABNORMAL LOW (ref 3.87–5.11)
RDW: 13.4 % (ref 11.5–15.5)
WBC: 13.4 10*3/uL — ABNORMAL HIGH (ref 4.0–10.5)
nRBC: 0 % (ref 0.0–0.2)

## 2018-07-28 LAB — URINE CULTURE: Culture: NO GROWTH

## 2018-07-28 LAB — GLUCOSE, CAPILLARY
GLUCOSE-CAPILLARY: 240 mg/dL — AB (ref 70–99)
Glucose-Capillary: 144 mg/dL — ABNORMAL HIGH (ref 70–99)
Glucose-Capillary: 157 mg/dL — ABNORMAL HIGH (ref 70–99)
Glucose-Capillary: 219 mg/dL — ABNORMAL HIGH (ref 70–99)
Glucose-Capillary: 170 mg/dL — ABNORMAL HIGH (ref 70–99)

## 2018-07-28 LAB — COMPREHENSIVE METABOLIC PANEL
ALT: 67 U/L — ABNORMAL HIGH (ref 0–44)
AST: 78 U/L — ABNORMAL HIGH (ref 15–41)
Albumin: 2.4 g/dL — ABNORMAL LOW (ref 3.5–5.0)
Alkaline Phosphatase: 140 U/L — ABNORMAL HIGH (ref 38–126)
Anion gap: 11 (ref 5–15)
BUN: 26 mg/dL — ABNORMAL HIGH (ref 8–23)
CHLORIDE: 114 mmol/L — AB (ref 98–111)
CO2: 23 mmol/L (ref 22–32)
Calcium: 8.6 mg/dL — ABNORMAL LOW (ref 8.9–10.3)
Creatinine, Ser: 1.5 mg/dL — ABNORMAL HIGH (ref 0.44–1.00)
GFR calc Af Amer: 40 mL/min — ABNORMAL LOW (ref >60)
GFR calc non Af Amer: 35 mL/min — ABNORMAL LOW (ref >60)
Glucose, Bld: 156 mg/dL — ABNORMAL HIGH (ref 70–99)
Potassium: 3.3 mmol/L — ABNORMAL LOW (ref 3.5–5.1)
SODIUM: 148 mmol/L — AB (ref 135–145)
Total Bilirubin: 0.8 mg/dL (ref 0.3–1.2)
Total Protein: 5.8 g/dL — ABNORMAL LOW (ref 6.5–8.1)

## 2018-07-28 MED ORDER — HYDRALAZINE HCL 20 MG/ML IJ SOLN
10.0000 mg | Freq: Once | INTRAMUSCULAR | Status: AC
  Administered 2018-07-28: 10 mg via INTRAVENOUS
  Filled 2018-07-28: qty 1

## 2018-07-28 MED ORDER — POTASSIUM CHLORIDE IN NACL 40-0.9 MEQ/L-% IV SOLN
INTRAVENOUS | Status: AC
  Administered 2018-07-28: 125 mL/h via INTRAVENOUS
  Filled 2018-07-28: qty 1000

## 2018-07-28 MED ORDER — POLYETHYLENE GLYCOL 3350 17 G PO PACK
17.0000 g | PACK | Freq: Every day | ORAL | Status: DC
  Administered 2018-07-28 – 2018-07-29 (×2): 17 g via ORAL
  Filled 2018-07-28 (×2): qty 1

## 2018-07-28 MED ORDER — ONDANSETRON HCL 4 MG/2ML IJ SOLN
4.0000 mg | Freq: Four times a day (QID) | INTRAMUSCULAR | Status: DC | PRN
  Administered 2018-07-28 – 2018-08-05 (×14): 4 mg via INTRAVENOUS
  Filled 2018-07-28 (×13): qty 2

## 2018-07-28 MED ORDER — PROMETHAZINE HCL 25 MG/ML IJ SOLN
12.5000 mg | INTRAMUSCULAR | Status: DC | PRN
  Administered 2018-07-28 – 2018-08-02 (×10): 12.5 mg via INTRAVENOUS
  Filled 2018-07-28 (×10): qty 1

## 2018-07-28 MED ORDER — SIMETHICONE 80 MG PO CHEW
80.0000 mg | CHEWABLE_TABLET | Freq: Four times a day (QID) | ORAL | Status: DC | PRN
  Administered 2018-07-30: 80 mg via ORAL
  Filled 2018-07-28: qty 1

## 2018-07-28 MED ORDER — SODIUM CHLORIDE 0.9 % IV SOLN
INTRAVENOUS | Status: DC
  Administered 2018-07-28 – 2018-08-02 (×11): via INTRAVENOUS

## 2018-07-28 MED ORDER — SODIUM CHLORIDE 0.9 % IV SOLN: INTRAVENOUS | Status: DC

## 2018-07-28 MED ORDER — ONDANSETRON HCL 4 MG PO TABS: 4.0000 mg | ORAL_TABLET | ORAL | Status: DC | PRN

## 2018-07-28 NOTE — Progress Notes (Signed)
Hydralazine 10 mg has been ordered and given. Will continue to monitor pt.

## 2018-07-28 NOTE — Progress Notes (Signed)
Pt taking in massive amounts of clear liquids, pt instructed that this is a trial and to not take in so much.  Daughter at bedside and states mom has no restraint.  Will continue to monitor.

## 2018-07-28 NOTE — Progress Notes (Signed)
Pt had a large amount of liquid BM, very watery.

## 2018-07-28 NOTE — Progress Notes (Addendum)
Pt's BP is 195/82 with MAP of 110. MAde NP on call aware. Awaiting for response.

## 2018-07-28 NOTE — Progress Notes (Addendum)
Patient ID: Emmylou Bieker, female   DOB: 16-Apr-1946, 73 y.o.   MRN: [id number]       Subjective: Patient feeling much better this morning. Pain from 10/10 yesterday to 3/10 this morning. Continues to localize in LLQ. Unsure of nausea. No emesis. Nursing staff tried NG tube yesterday without success. Passing flatus. No BM. Mobilizing to bedside commode. Bladder scan with 750 yesterday prior to I&O. Now voiding on own.   Objective: Vital signs in last 24 hours: Temp:  [97.2 F (36.2 C)-98.1 F (36.7 C)] 98.1 F (36.7 C) (01/15 0322) Pulse Rate:  [72-76] 72 (01/15 0322) Resp:  [18-20] 18 (01/15 0322) BP: (138-170)/(53-85) 170/85 (01/15 0322) SpO2:  [95 %-98 %] 95 % (01/15 0322) Weight:  [114.8 kg] 114.8 kg (01/15 0500) Last BM Date: 07/24/18  Intake/Output from previous day: 01/14 0701 - 01/15 0700 In: 500 [I.V.:500] Out: 1050 [Urine:1050] Intake/Output this shift: No intake/output data recorded.  PE: Gen: Sitting upright in bed, NAD.  Heart: RRR Lungs: CTA b/l, normal effort Abd: Soft, non-distended. Tenderness in the LLQ. No r/r/g Scabs over abdomen patient states is from "picking". Decreased BS.   Lab Results:  Recent Labs    07/27/18 0339 07/28/18 0214  WBC 14.6* 13.4*  HGB 12.1 10.1*  HCT 37.4 31.5*  PLT 361 344   BMET Recent Labs    07/27/18 0339 07/28/18 0214  NA 142 148*  K 3.0* 3.3*  CL 107 114*  CO2 21* 23  GLUCOSE 231* 156*  BUN 21 26*  CREATININE 1.31* 1.50*  CALCIUM 8.8* 8.6*   PT/INR No results for input(s): LABPROT, INR in the last 72 hours. CMP     Component Value Date/Time   NA 148 (H) 07/28/2018 0214   K 3.3 (L) 07/28/2018 0214   CL 114 (H) 07/28/2018 0214   CO2 23 07/28/2018 0214   GLUCOSE 156 (H) 07/28/2018 0214   BUN 26 (H) 07/28/2018 0214   CREATININE 1.50 (H) 07/28/2018 0214   CALCIUM 8.6 (L) 07/28/2018 0214   PROT 5.8 (L) 07/28/2018 0214   ALBUMIN 2.4 (L) 07/28/2018 0214   AST 78 (H) 07/28/2018 0214   ALT 67 (H) 07/28/2018  0214   ALKPHOS 140 (H) 07/28/2018 0214   BILITOT 0.8 07/28/2018 0214   GFRNONAA 35 (L) 07/28/2018 0214   GFRAA 40 (L) 07/28/2018 0214   Lipase     Component Value Date/Time   LIPASE 22 07/26/2018 1700       Studies/Results: Ct Abdomen Pelvis W Contrast  Result Date: 07/26/2018 CLINICAL DATA:  72 year old female with history of left-sided abdominal pain for 1 week. EXAM: CT ABDOMEN AND PELVIS WITH CONTRAST TECHNIQUE: Multidetector CT imaging of the abdomen and pelvis was performed using the standard protocol following bolus administration of intravenous contrast. CONTRAST:  17mL OMNIPAQUE IOHEXOL 300 MG/ML  SOLN COMPARISON:  CT the abdomen and pelvis 11/01/2015. FINDINGS: Lower chest: Atherosclerotic calcifications in the descending thoracic aorta as well as the left circumflex and right coronary arteries. Calcifications of the aortic valve and mitral annulus. Hepatobiliary: No definite cystic or solid hepatic lesions. Mild intrahepatic biliary ductal dilatation. Common bile duct measures up to 18 mm in the porta hepatis. Both of the previous findings are only slightly increased compared to prior study from 2017. No calcified stone in the common bile duct. Status post cholecystectomy. Pancreas: No pancreatic mass. No pancreatic ductal dilatation. No pancreatic or peripancreatic fluid or inflammatory changes. Spleen: Unremarkable. Adrenals/Urinary Tract: 1.8 x 1.6 cm right adrenal nodule is  indeterminate, but only minimally increased in size compared to prior exam 11/01/2015, statistically likely a benign lesions such as an adenoma. Left adrenal gland is normal in appearance. Multiple low-attenuation lesions in the kidneys bilaterally, compatible with simple cysts, largest of which is exophytic in the lateral aspect of the upper pole of the right kidney measuring 4.2 cm in diameter. There is also a subcentimeter intermediate attenuation lesion in the lower pole of the left kidney, too small to  characterize, but stable in size compared to the prior study from 2017, likely a mildly proteinaceous cyst. No hydroureteronephrosis. Urinary bladder is normal in appearance. Stomach/Bowel: Normal appearance of the stomach. No pathologic dilatation of small bowel or colon. Colon is mildly distended with multiple air-fluid levels (nonspecific). Notably, there appears to be pneumatosis involving the cecum, ascending colon and hepatic flexure of the colon. No surrounding inflammatory changes are noted. The appendix is not confidently identified and may be surgically absent. Regardless, there are no inflammatory changes noted adjacent to the cecum to suggest the presence of an acute appendicitis at this time. Vascular/Lymphatic: No portal venous gas. Aortic atherosclerosis, without evidence of aneurysm or dissection in the abdominal or pelvic vasculature. Today's examination was not a CTA exam, however, the celiac axis, superior mesenteric artery, inferior mesenteric artery and their major branches all appear widely patent without hemodynamically significant stenosis. No lymphadenopathy noted in the abdomen or pelvis. Reproductive: Status post hysterectomy. Ovaries are not confidently identified may be surgically absent or atrophic. Other: Small umbilical hernia containing only omental fat. No significant volume of ascites. No pneumoperitoneum. Musculoskeletal: There are no aggressive appearing lytic or blastic lesions noted in the visualized portions of the skeleton. IMPRESSION: 1. Pneumatosis involving the cecum, ascending colon and hepatic flexure of the colon. No definite source for this pneumatosis is identified on today's examination. No associated portal venous gas. No major vascular occlusion to account for this finding. Further clinical evaluation is suggested. 2. Mild distention of the colon with multiple air-fluid levels. This is nonspecific. No findings to suggest bowel obstruction at this time. 3. Small  umbilical hernia containing only omental fat, without evidence of bowel incarceration or obstruction. 4. Aortic atherosclerosis, in addition to least 2 vessel coronary artery disease. Assessment for potential risk factor modification, dietary therapy or pharmacologic therapy may be warranted, if clinically indicated. 5. Additional incidental findings, as above. Electronically Signed   By: Vinnie Langton M.D.   On: 07/26/2018 20:47   Dg Chest Port 1 View  Result Date: 07/27/2018 CLINICAL DATA:  Fever today. EXAM: PORTABLE CHEST 1 VIEW COMPARISON:  Single-view of the chest 05/10/2018 and 04/15/2016. CT chest 11/28/2015. FINDINGS: Chronically low lung volumes again seen. There is cardiomegaly and aortic atherosclerosis. No consolidative process, pneumothorax or effusion. No acute bony abnormality. IMPRESSION: No acute disease. Cardiomegaly. Atherosclerosis. Electronically Signed   By: Inge Rise M.D.   On: 07/27/2018 13:51   Dg Abd Portable 1v  Result Date: 07/28/2018 CLINICAL DATA:  Abdominal pain.  Nausea and vomiting. EXAM: PORTABLE ABDOMEN - 1 VIEW COMPARISON:  CT abdomen pelvis 07/26/2018 FINDINGS: There is continued diffuse dilation of the colon compatible with a distal obstruction. No free air is present. Bibasilar airspace disease and effusions are noted. IMPRESSION: 1. 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 2. No evidence for free air or progressive pneumatosis. Electronically Signed   By: San Morelle M.D.   On: 07/28/2018 07:21    Anti-infectives: Anti-infectives (From admission, onward)   Start  Dose/Rate Route Frequency Ordered Stop   07/27/18 0400  piperacillin-tazobactam (ZOSYN) IVPB 3.375 g     3.375 g 12.5 mL/hr over 240 Minutes Intravenous Every 8 hours 07/26/18 2203     07/26/18 2130  piperacillin-tazobactam (ZOSYN) IVPB 3.375 g     3.375 g 100 mL/hr over 30 Minutes Intravenous  Once 07/26/18 2129 07/26/18 2218        Assessment/Plan Hx of CVA  ?A.fib (seen on loop recorder per hospitalists note) HTN HLD Seizures CHF DM  Abdominal pain, uncertain etiology -CT with pneumatosis involving the cecum, ascending colon and hepatic flexure of the colon. There is no right sided pain to correspond with this. -CT with possible sigmoid stricture causing partial obstruction and pain. Xray this morning with persistent colonic obstruction concerning for distal neoplasm. She is now with improved pain, no emesis, and passing gas. Clinically she is not completey obstructed. Can hold on NG tube for now. Keep NPO and await GI input. Can have trial of clears from surgery perspective.  -Continue IV abx  -No fever overnight. WBC downtrending. Cr elevated. Repeat labs in AM.  -Mobilize and IS -Appreciate hospitalists and GI input -We will continue to follow. No acute surgical intervention at this time.   FEN - NPO, await GI input VTE - SCD, per medicine ID - Zosyn 1/13 >>   LOS: 1 day    Jillyn Ledger , [facility] 07/28/2018, 7:35 AM Pager: [phone]

## 2018-07-28 NOTE — Progress Notes (Signed)
   07/28/18 1500  Clinical Encounter Type  Visited With Patient not available  Visit Type Follow-up   Attempted follow-up visit but pt appeared to be sleeping.  Myra Gianotti resident, [phone]

## 2018-07-28 NOTE — Progress Notes (Signed)
PROGRESS NOTE    Sharon [name]  ZOX:[id number] DOB: 12/18/1945 DOA: 07/26/2018 PCP: [name], MD     Brief Narrative:  Sharon [name] is a 73 yo female with past medical history significant for chronic diastolic heart failure, type 2 diabetes insulin-dependent, CVA, atrial fibrillation seen on loop recorder reportedly, who presented with abdominal pain.  CT abdomen pelvis revealed pneumatosis of the colon of unclear etiology.  General surgery and GI have been consulted.  New events last 24 hours / Subjective: Complaining of nausea without vomiting.  Does not want NG tube.  Continues to have left lower quadrant abdominal pain.  Assessment & Plan:   Principal Problem:   Pneumatosis intestinalis Active Problems:   Type 2 diabetes mellitus without complication (HCC)   Hypokalemia   Diabetes mellitus type 2, controlled (HCC)   Chronic diastolic CHF (congestive heart failure) (HCC)   AKI (acute kidney injury) ([place])   CVA (cerebral vascular accident) ([place])   Elevated LFTs   Aortic atherosclerosis (HCC)   Colonic pneumatosis, ileus -CTwith pneumatosis involving the cecum, ascending colon and hepatic flexure of the colon -AXR with persistent colonic obstruction concerning for distal neoplasm General surgery and GI following -Continue IV Zosyn -GI recommend trial of MiraLAX, clear liquid diet.  Repeat x-ray in the morning.  If symptoms worsen, recommend CTA for further evaluation  Acute kidney injury -Creatinine 0.9 --> 1.5 -Continue IVF and trend BMP   Insulin-dependent diabetes type 2 -Continue Lantus, sliding scale insulin  Chronic diastolic heart failure -Euvolemic currently continue to monitor  History of CVA -Continue Eliquis when able to take p.o.   Hypokalemia -Replace, trend   Elevated liver enzymes -Trending down, continue to monitor    DVT prophylaxis: Eliquis when able, continue SCD Code Status: Full code Family Communication: No family at  bedside Disposition Plan: Pending clinical improvement   Consultants:   GI  General surgery  Procedures:   None  Antimicrobials:  Anti-infectives (From admission, onward)   Start     Dose/Rate Route Frequency Ordered Stop   07/27/18 0400  piperacillin-tazobactam (ZOSYN) IVPB 3.375 g     3.375 g 12.5 mL/hr over 240 Minutes Intravenous Every 8 hours 07/26/18 2203     07/26/18 2130  piperacillin-tazobactam (ZOSYN) IVPB 3.375 g     3.375 g 100 mL/hr over 30 Minutes Intravenous  Once 07/26/18 2129 07/26/18 2218       Objective: Vitals:   07/27/18 2028 07/28/18 0322 07/28/18 0500 07/28/18 1325  BP: (!) 146/53 (!) 170/85  (!) 154/64  Pulse: 74 72  71  Resp: 20 18  18   Temp: (!) 97.5 F (36.4 C) 98.1 F (36.7 C)    TempSrc: Oral Oral    SpO2: 98% 95%  94%  Weight:   114.8 kg   Height:        Intake/Output Summary (Last 24 hours) at 07/28/2018 1520 Last data filed at 07/28/2018 0900 Gross per 24 hour  Intake 740 ml  Output 1050 ml  Net -310 ml   Filed Weights   07/26/18 1636 07/27/18 0500 07/28/18 0500  Weight: 120.2 kg 115.4 kg 114.8 kg    Examination:  General exam: Appears calm and comfortable  Respiratory system: Clear to auscultation. Respiratory effort normal. Cardiovascular system: S1 & S2 heard, RRR. No JVD, murmurs, rubs, gallops or clicks. No pedal edema. Gastrointestinal system: Abdomen is distended, TTP LLQ Central nervous system: Alert and oriented. No focal neurological deficits. Extremities: Symmetric 5 x 5 power. Skin: No rashes, lesions  or ulcers Psychiatry: Judgement and insight appear normal. Mood & affect appropriate.   Data Reviewed: I have personally reviewed following labs and imaging studies  CBC: Recent Labs  Lab 07/26/18 1700 07/26/18 1741 07/27/18 0339 07/28/18 0214  WBC 16.3*  --  14.6* 13.4*  NEUTROABS 13.5*  --  13.7*  --   HGB 11.8* 11.6* 12.1 10.1*  HCT 38.1 34.0* 37.4 31.5*  MCV 93.2  --  91.2 92.1  PLT 364  --  361  983   Basic Metabolic Panel: Recent Labs  Lab 07/26/18 1700 07/26/18 1741 07/27/18 0339 07/28/18 0214  NA 139 139 142 148*  K 2.7* 3.0* 3.0* 3.3*  CL 104 105 107 114*  CO2 18*  --  21* 23  GLUCOSE 236* 238* 231* 156*  BUN 21 25* 21 26*  CREATININE 1.50* 0.90 1.31* 1.50*  CALCIUM 8.8*  --  8.8* 8.6*  MG  --   --  2.5*  --    GFR: Estimated Creatinine Clearance: 39.6 mL/min (A) (by C-G formula based on SCr of 1.5 mg/dL (H)). Liver Function Tests: Recent Labs  Lab 07/26/18 1700 07/27/18 0339 07/28/18 0214  AST 8* 145* 78*  ALT 9 61* 67*  ALKPHOS 64 166* 140*  BILITOT 1.4* 1.8* 0.8  PROT 6.6 6.6 5.8*  ALBUMIN 3.0* 2.9* 2.4*   Recent Labs  Lab 07/26/18 1700  LIPASE 22   No results for input(s): AMMONIA in the last 168 hours. Coagulation Profile: No results for input(s): INR, PROTIME in the last 168 hours. Cardiac Enzymes: No results for input(s): CKTOTAL, CKMB, CKMBINDEX, TROPONINI in the last 168 hours. BNP (last 3 results) No results for input(s): PROBNP in the last 8760 hours. HbA1C: No results for input(s): HGBA1C in the last 72 hours. CBG: Recent Labs  Lab 07/27/18 2030 07/27/18 2349 07/28/18 0332 07/28/18 0838 07/28/18 1156  GLUCAP 192* 166* 144* 157* 219*   Lipid Profile: No results for input(s): CHOL, HDL, LDLCALC, TRIG, CHOLHDL, LDLDIRECT in the last 72 hours. Thyroid Function Tests: No results for input(s): TSH, T4TOTAL, FREET4, T3FREE, THYROIDAB in the last 72 hours. Anemia Panel: No results for input(s): VITAMINB12, FOLATE, FERRITIN, TIBC, IRON, RETICCTPCT in the last 72 hours. Sepsis Labs: Recent Labs  Lab 07/26/18 1907 07/27/18 1322  LATICACIDVEN 1.03 1.4    Recent Results (from the past 240 hour(s))  Culture, Urine     Status: None   Collection Time: 07/27/18  4:51 PM  Result Value Ref Range Status   Specimen Description URINE, CATHETERIZED  Final   Special Requests NONE  Final   Culture   Final    NO GROWTH Performed at [facility], [place] [address], [place], [place] [address]    Report Status 07/28/2018 FINAL  Final       Radiology Studies: Ct Abdomen Pelvis W Contrast  Result Date: 07/26/2018 CLINICAL DATA:  73 year old female with history of left-sided abdominal pain for 1 week. EXAM: CT ABDOMEN AND PELVIS WITH CONTRAST TECHNIQUE: Multidetector CT imaging of the abdomen and pelvis was performed using the standard protocol following bolus administration of intravenous contrast. CONTRAST:  17mL OMNIPAQUE IOHEXOL 300 MG/ML  SOLN COMPARISON:  CT the abdomen and pelvis 11/01/2015. FINDINGS: Lower chest: Atherosclerotic calcifications in the descending thoracic aorta as well as the left circumflex and right coronary arteries. Calcifications of the aortic valve and mitral annulus. Hepatobiliary: No definite cystic or solid hepatic lesions. Mild intrahepatic biliary ductal dilatation. Common bile duct measures up to 18  mm in the porta hepatis. Both of the previous findings are only slightly increased compared to prior study from 2017. No calcified stone in the common bile duct. Status post cholecystectomy. Pancreas: No pancreatic mass. No pancreatic ductal dilatation. No pancreatic or peripancreatic fluid or inflammatory changes. Spleen: Unremarkable. Adrenals/Urinary Tract: 1.8 x 1.6 cm right adrenal nodule is indeterminate, but only minimally increased in size compared to prior exam 11/01/2015, statistically likely a benign lesions such as an adenoma. Left adrenal gland is normal in appearance. Multiple low-attenuation lesions in the kidneys bilaterally, compatible with simple cysts, largest of which is exophytic in the lateral aspect of the upper pole of the right kidney measuring 4.2 cm in diameter. There is also a subcentimeter intermediate attenuation lesion in the lower pole of the left kidney, too small to characterize, but stable in size compared to the prior study from 2017, likely a mildly proteinaceous cyst. No  hydroureteronephrosis. Urinary bladder is normal in appearance. Stomach/Bowel: Normal appearance of the stomach. No pathologic dilatation of small bowel or colon. Colon is mildly distended with multiple air-fluid levels (nonspecific). Notably, there appears to be pneumatosis involving the cecum, ascending colon and hepatic flexure of the colon. No surrounding inflammatory changes are noted. The appendix is not confidently identified and may be surgically absent. Regardless, there are no inflammatory changes noted adjacent to the cecum to suggest the presence of an acute appendicitis at this time. Vascular/Lymphatic: No portal venous gas. Aortic atherosclerosis, without evidence of aneurysm or dissection in the abdominal or pelvic vasculature. Today's examination was not a CTA exam, however, the celiac axis, superior mesenteric artery, inferior mesenteric artery and their major branches all appear widely patent without hemodynamically significant stenosis. No lymphadenopathy noted in the abdomen or pelvis. Reproductive: Status post hysterectomy. Ovaries are not confidently identified may be surgically absent or atrophic. Other: Small umbilical hernia containing only omental fat. No significant volume of ascites. No pneumoperitoneum. Musculoskeletal: There are no aggressive appearing lytic or blastic lesions noted in the visualized portions of the skeleton. IMPRESSION: 1. Pneumatosis involving the cecum, ascending colon and hepatic flexure of the colon. No definite source for this pneumatosis is identified on today's examination. No associated portal venous gas. No major vascular occlusion to account for this finding. Further clinical evaluation is suggested. 2. Mild distention of the colon with multiple air-fluid levels. This is nonspecific. No findings to suggest bowel obstruction at this time. 3. Small umbilical hernia containing only omental fat, without evidence of bowel incarceration or obstruction. 4. Aortic  atherosclerosis, in addition to least 2 vessel coronary artery disease. Assessment for potential risk factor modification, dietary therapy or pharmacologic therapy may be warranted, if clinically indicated. 5. Additional incidental findings, as above. Electronically Signed   By: Vinnie Langton M.D.   On: 07/26/2018 20:47   Dg Chest Port 1 View  Result Date: 07/27/2018 CLINICAL DATA:  Fever today. EXAM: PORTABLE CHEST 1 VIEW COMPARISON:  Single-view of the chest 05/10/2018 and 04/15/2016. CT chest 11/28/2015. FINDINGS: Chronically low lung volumes again seen. There is cardiomegaly and aortic atherosclerosis. No consolidative process, pneumothorax or effusion. No acute bony abnormality. IMPRESSION: No acute disease. Cardiomegaly. Atherosclerosis. Electronically Signed   By: Inge Rise M.D.   On: 07/27/2018 13:51   Dg Abd Portable 1v  Result Date: 07/28/2018 CLINICAL DATA:  Abdominal pain.  Nausea and vomiting. EXAM: PORTABLE ABDOMEN - 1 VIEW COMPARISON:  CT abdomen pelvis 07/26/2018 FINDINGS: There is continued diffuse dilation of the colon compatible with a distal obstruction.  No free air is present. Bibasilar airspace disease and effusions are noted. IMPRESSION: 1. 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 2. No evidence for free air or progressive pneumatosis. Electronically Signed   By: San Morelle M.D.   On: 07/28/2018 07:21      Scheduled Meds: . insulin aspart  0-9 Units Subcutaneous Q4H  . insulin glargine  35 Units Subcutaneous Daily  . polyethylene glycol  17 g Oral Daily   Continuous Infusions: . sodium chloride Stopped (07/27/18 0403)  . sodium chloride    . 0.9 % NaCl with KCl 40 mEq / L 125 mL/hr (07/28/18 0826)  . piperacillin-tazobactam (ZOSYN)  IV 3.375 g (07/28/18 1217)     LOS: 1 day    Time spent: 35 minutes   Dessa Phi, DO [facility] [email or web address] 07/28/2018, 3:20 PM

## 2018-07-28 NOTE — Progress Notes (Addendum)
[facility] Gastroenterology Progress Note  Sharon [name] 73 y.o. [birth year]  CC: Nausea, vomiting, abdominal pain and abnormal CT.   Subjective: Feeling somewhat better today but continues to have nausea.  No vomiting today.  Abdomen remains distended.  Passing flatus but no bowel movement.  Not able to place NG tube yesterday.  ROS: Negative for chest pain and shortness of breath.   Objective: Vital signs in last 24 hours: Vitals:   07/27/18 2028 07/28/18 0322  BP: (!) 146/53 (!) 170/85  Pulse: 74 72  Resp: 20 18  Temp: (!) 97.5 F (36.4 C) 98.1 F (36.7 C)  SpO2: 98% 95%    Physical Exam:  General.  Obese patient currently not in acute distress. Abdomen.  Distended with left lower quadrant tenderness to palpation, hypoactive bowel sounds, no peritoneal signs Psych.  Mood and affect normal. Neuro.  A/O  x3.  Lab Results: Recent Labs    07/27/18 0339 07/28/18 0214  NA 142 148*  K 3.0* 3.3*  CL 107 114*  CO2 21* 23  GLUCOSE 231* 156*  BUN 21 26*  CREATININE 1.31* 1.50*  CALCIUM 8.8* 8.6*  MG 2.5*  --    Recent Labs    07/27/18 0339 07/28/18 0214  AST 145* 78*  ALT 61* 67*  ALKPHOS 166* 140*  BILITOT 1.8* 0.8  PROT 6.6 5.8*  ALBUMIN 2.9* 2.4*   Recent Labs    07/26/18 1700  07/27/18 0339 07/28/18 0214  WBC 16.3*  --  14.6* 13.4*  NEUTROABS 13.5*  --  13.7*  --   HGB 11.8*   < > 12.1 10.1*  HCT 38.1   < > 37.4 31.5*  MCV 93.2  --  91.2 92.1  PLT 364  --  361 344   < > = values in this interval not displayed.   No results for input(s): LABPROT, INR in the last 72 hours.    Assessment/Plan: -Left-sided abdominal pain with CT scan showing pneumatosis involving cecum, ascending colon and hepatic flexure.  Patient has mild elevated LFTs as well as mild elevation in kidney function.  Possibility of small ischemic event involving small bowel and right colon cannot be ruled out.  Recent CT scan suggests no evidence of major vascular occlusion. - Nausea and  Vomiting.  Improving -Abnormal LFTs.  Improving.  Recommendations: ------------------------- -Less likely to have obstructive lesion/neoplasm given recent colonoscopy in April 2019.  Most likely she has colonic ileus. -Trial of MiraLAX. - Trial of clear liquid diet. -Recheck x-ray in the morning. -If worsening symptoms, recommend CTA for further evaluation. -Ambulate with assistance. - GI will follow.   Otis Brace MD, [place] 07/28/2018, 9:46 AM  Contact #  [phone]

## 2018-07-29 ENCOUNTER — Inpatient Hospital Stay (HOSPITAL_COMMUNITY): Payer: Medicare Other

## 2018-07-29 LAB — CBC
HCT: 32.8 % — ABNORMAL LOW (ref 36.0–46.0)
Hemoglobin: 10.5 g/dL — ABNORMAL LOW (ref 12.0–15.0)
MCH: 29.4 pg (ref 26.0–34.0)
MCHC: 32 g/dL (ref 30.0–36.0)
MCV: 91.9 fL (ref 80.0–100.0)
Platelets: 357 10*3/uL (ref 150–400)
RBC: 3.57 MIL/uL — ABNORMAL LOW (ref 3.87–5.11)
RDW: 13.4 % (ref 11.5–15.5)
WBC: 12 10*3/uL — ABNORMAL HIGH (ref 4.0–10.5)
nRBC: 0 % (ref 0.0–0.2)

## 2018-07-29 LAB — COMPREHENSIVE METABOLIC PANEL
ALT: 59 U/L — ABNORMAL HIGH (ref 0–44)
AST: 41 U/L (ref 15–41)
Albumin: 2.4 g/dL — ABNORMAL LOW (ref 3.5–5.0)
Alkaline Phosphatase: 139 U/L — ABNORMAL HIGH (ref 38–126)
Anion gap: 8 (ref 5–15)
BILIRUBIN TOTAL: 0.4 mg/dL (ref 0.3–1.2)
BUN: 15 mg/dL (ref 8–23)
CO2: 20 mmol/L — ABNORMAL LOW (ref 22–32)
Calcium: 8.6 mg/dL — ABNORMAL LOW (ref 8.9–10.3)
Chloride: 116 mmol/L — ABNORMAL HIGH (ref 98–111)
Creatinine, Ser: 0.97 mg/dL (ref 0.44–1.00)
GFR calc non Af Amer: 59 mL/min — ABNORMAL LOW (ref >60)
Glucose, Bld: 174 mg/dL — ABNORMAL HIGH (ref 70–99)
Potassium: 3.2 mmol/L — ABNORMAL LOW (ref 3.5–5.1)
Sodium: 144 mmol/L (ref 135–145)
Total Protein: 5.9 g/dL — ABNORMAL LOW (ref 6.5–8.1)

## 2018-07-29 LAB — GLUCOSE, CAPILLARY
GLUCOSE-CAPILLARY: 118 mg/dL — AB (ref 70–99)
Glucose-Capillary: 119 mg/dL — ABNORMAL HIGH (ref 70–99)
Glucose-Capillary: 132 mg/dL — ABNORMAL HIGH (ref 70–99)
Glucose-Capillary: 151 mg/dL — ABNORMAL HIGH (ref 70–99)
Glucose-Capillary: 155 mg/dL — ABNORMAL HIGH (ref 70–99)
Glucose-Capillary: 163 mg/dL — ABNORMAL HIGH (ref 70–99)

## 2018-07-29 MED ORDER — IOHEXOL 300 MG/ML  SOLN
100.0000 mL | Freq: Once | INTRAMUSCULAR | Status: AC | PRN
  Administered 2018-07-29: 100 mL via INTRAVENOUS

## 2018-07-29 MED ORDER — HYDRALAZINE HCL 20 MG/ML IJ SOLN
10.0000 mg | INTRAMUSCULAR | Status: DC | PRN
  Administered 2018-07-29 – 2018-08-07 (×11): 10 mg via INTRAVENOUS
  Filled 2018-07-29 (×11): qty 1

## 2018-07-29 MED ORDER — POTASSIUM CHLORIDE 10 MEQ/100ML IV SOLN
10.0000 meq | INTRAVENOUS | Status: AC
  Administered 2018-07-29 (×6): 10 meq via INTRAVENOUS
  Filled 2018-07-29 (×3): qty 100

## 2018-07-29 MED ORDER — POLYETHYLENE GLYCOL 3350 17 G PO PACK
17.0000 g | PACK | Freq: Two times a day (BID) | ORAL | Status: DC
  Administered 2018-07-30: 17 g via ORAL
  Filled 2018-07-29 (×4): qty 1

## 2018-07-29 NOTE — Progress Notes (Signed)
Patient ID: Sharon [name], female   DOB: [birth year], 73 y.o.   MRN: [id number]       Subjective: Patient still with nausea and LLQ abdominal pain.  Doesn't feel great overall, but states her pain is better than on admission.  Passing some flatus, but only had a very tiny BM.  Objective: Vital signs in last 24 hours: Temp:  [98.4 F (36.9 C)-98.6 F (37 C)] 98.4 F (36.9 C) (01/16 0424) Pulse Rate:  [70-79] 79 (01/16 0424) Resp:  [18-22] 21 (01/16 0424) BP: (148-195)/(64-85) 148/72 (01/16 0424) SpO2:  [94 %] 94 % (01/16 0424) Last BM Date: 07/28/18  Intake/Output from previous day: 01/15 0701 - 01/16 0700 In: 4360.7 [P.O.:1920; I.V.:2130.7; IV Piggyback:310] Out: 400 [Urine:400] Intake/Output this shift: No intake/output data recorded.  PE: Abd: morbidly obese, multiple open wounds from ?scratching, distended, hypoactive BS, tender minimally in LUQ, quite tender still in LLQ with some voluntary guarding.  Lab Results:  Recent Labs    07/28/18 0214 07/29/18 0235  WBC 13.4* 12.0*  HGB 10.1* 10.5*  HCT 31.5* 32.8*  PLT 344 357   BMET Recent Labs    07/28/18 0214 07/29/18 0235  NA 148* 144  K 3.3* 3.2*  CL 114* 116*  CO2 23 20*  GLUCOSE 156* 174*  BUN 26* 15  CREATININE 1.50* 0.97  CALCIUM 8.6* 8.6*   PT/INR No results for input(s): LABPROT, INR in the last 72 hours. CMP     Component Value Date/Time   NA 144 07/29/2018 0235   K 3.2 (L) 07/29/2018 0235   CL 116 (H) 07/29/2018 0235   CO2 20 (L) 07/29/2018 0235   GLUCOSE 174 (H) 07/29/2018 0235   BUN 15 07/29/2018 0235   CREATININE 0.97 07/29/2018 0235   CALCIUM 8.6 (L) 07/29/2018 0235   PROT 5.9 (L) 07/29/2018 0235   ALBUMIN 2.4 (L) 07/29/2018 0235   AST 41 07/29/2018 0235   ALT 59 (H) 07/29/2018 0235   ALKPHOS 139 (H) 07/29/2018 0235   BILITOT 0.4 07/29/2018 0235   GFRNONAA 59 (L) 07/29/2018 0235   GFRAA >60 07/29/2018 0235   Lipase     Component Value Date/Time   LIPASE 22 07/26/2018 1700        Studies/Results: Dg Chest Port 1 View  Result Date: 07/27/2018 CLINICAL DATA:  Fever today. EXAM: PORTABLE CHEST 1 VIEW COMPARISON:  Single-view of the chest 05/10/2018 and 04/15/2016. CT chest 11/28/2015. FINDINGS: Chronically low lung volumes again seen. There is cardiomegaly and aortic atherosclerosis. No consolidative process, pneumothorax or effusion. No acute bony abnormality. IMPRESSION: No acute disease. Cardiomegaly. Atherosclerosis. Electronically Signed   By: Inge Rise M.D.   On: 07/27/2018 13:51   Dg Abd Portable 1v  Result Date: 07/29/2018 CLINICAL DATA:  Abdominal distension EXAM: PORTABLE ABDOMEN - 1 VIEW COMPARISON:  07/28/2018; CT abdomen and pelvis-07/26/2018 FINDINGS: There is persistent marked gaseous distension of the colon with the cecum measuring approximately 12.8 cm in diameter, unchanged. Nondiagnostic evaluation for pneumoperitoneum secondary to supine positioning and exclusion of the lower thorax. Previously questioned pneumatosis demonstrated on prior abdominal CT is not definitely demonstrated on the present examination. No definite portal venous gas. No definitive abnormal intra-abdominal calcifications. Degenerative change of the lower lumbar spine. IMPRESSION: Unchanged marked gaseous distention of colon with findings again worrisome for potential distal colonic obstruction. Electronically Signed   By: Sandi Mariscal M.D.   On: 07/29/2018 06:59   Dg Abd Portable 1v  Result Date: 07/28/2018 CLINICAL DATA:  Abdominal pain.  Nausea and vomiting. EXAM: PORTABLE ABDOMEN - 1 VIEW COMPARISON:  CT abdomen pelvis 07/26/2018 FINDINGS: There is continued diffuse dilation of the colon compatible with a distal obstruction. No free air is present. Bibasilar airspace disease and effusions are noted. IMPRESSION: 1. 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 2. No evidence for free air or progressive pneumatosis. Electronically Signed   By: San Morelle M.D.   On: 07/28/2018 07:21    Anti-infectives: Anti-infectives (From admission, onward)   Start     Dose/Rate Route Frequency Ordered Stop   07/27/18 0400  piperacillin-tazobactam (ZOSYN) IVPB 3.375 g     3.375 g 12.5 mL/hr over 240 Minutes Intravenous Every 8 hours 07/26/18 2203     07/26/18 2130  piperacillin-tazobactam (ZOSYN) IVPB 3.375 g     3.375 g 100 mL/hr over 30 Minutes Intravenous  Once 07/26/18 2129 07/26/18 2218       Assessment/Plan Hx of CVA  ?A.fib (seen on loop recorder per hospitalists note) HTN HLD Seizures CHF DM  Abdominal pain, uncertain etiology, ? Colonic ileus -film this morning shows persistent dilatation of her colon.  Cecum measures almost 13cm, but nontender on right side. -unclear why point specific pain in LLQ  -Continue IV abx -No fever overnight. WBC downtrending.  -Mobilize and IS -Appreciate hospitalists and GI input -We will continue to follow. No acute surgical intervention at this time.  FEN -CLD VTE -SCD, per medicine ID -Zosyn 1/13 >>   LOS: 2 days    Henreitta Cea , [facility] Surgery 07/29/2018, 7:39 AM Pager: [phone]

## 2018-07-29 NOTE — Progress Notes (Signed)
[facility] Gastroenterology Progress Note  Sharon [name] 73 y.o. 1946-05-09  CC: Nausea, vomiting, abdominal pain and abnormal CT.   Subjective: Tolerating clear liquid diet.  Had a small bowel movement this morning.  Passing flatus.  Continues to have abdominal distention.  ROS: Negative for chest pain and shortness of breath.   Objective: Vital signs in last 24 hours: Vitals:   07/28/18 2230 07/29/18 0424  BP: (!) 166/82 (!) 148/72  Pulse:  79  Resp:  (!) 21  Temp:  98.4 F (36.9 C)  SpO2:  94%    Physical Exam:  General.  Obese patient currently not in acute distress. Abdomen.  Distended with left lower quadrant tenderness to palpation, hypoactive bowel sounds, no peritoneal signs Psych.  Mood and affect normal. Neuro.  A/O  x3.  Lab Results: Recent Labs    07/27/18 0339 07/28/18 0214 07/29/18 0235  NA 142 148* 144  K 3.0* 3.3* 3.2*  CL 107 114* 116*  CO2 21* 23 20*  GLUCOSE 231* 156* 174*  BUN 21 26* 15  CREATININE 1.31* 1.50* 0.97  CALCIUM 8.8* 8.6* 8.6*  MG 2.5*  --   --    Recent Labs    07/28/18 0214 07/29/18 0235  AST 78* 41  ALT 67* 59*  ALKPHOS 140* 139*  BILITOT 0.8 0.4  PROT 5.8* 5.9*  ALBUMIN 2.4* 2.4*   Recent Labs    07/26/18 1700  07/27/18 0339 07/28/18 0214 07/29/18 0235  WBC 16.3*  --  14.6* 13.4* 12.0*  NEUTROABS 13.5*  --  13.7*  --   --   HGB 11.8*   < > 12.1 10.1* 10.5*  HCT 38.1   < > 37.4 31.5* 32.8*  MCV 93.2  --  91.2 92.1 91.9  PLT 364  --  361 344 357   < > = values in this interval not displayed.   No results for input(s): LABPROT, INR in the last 72 hours.    Assessment/Plan: -Left-sided abdominal pain with CT scan showing pneumatosis involving cecum, ascending colon and hepatic flexure.  Patient has mild elevated LFTs as well as mild elevation in kidney function.  Possibility of small ischemic event involving small bowel and right colon cannot be ruled out.  Recent CT scan suggests no evidence of major vascular  occlusion.  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  Improving -Abnormal LFTs.  Improving.  Recommendations: ------------------------- -Symptoms are improving but continues to have significant abdominal distention.  X-ray this morning again showed no improvement in distention. D/W Dr. Watt Climes.  -CTA for follow-up on pneumatosis that was seen on the recent CT scan  -Increase MiraLAX to twice a day. -Continue clear liquid diet for now. -Consider flexible sigmoidoscopy/colonoscopy for decompression if CTA negative.  -Less likely to have obstructive lesion/neoplasm given recent colonoscopy in April 2019.  Most likely she has colonic ileus.  -Ambulate with assistance. - GI will follow.   Otis Brace MD, [place] 07/29/2018, 1:43 PM  Contact #  [phone]

## 2018-07-29 NOTE — NC FL2 (Signed)
[place] LEVEL OF CARE SCREENING TOOL     IDENTIFICATION  Patient Name: Sharon [name] Birthdate: 02/21/1946 Sex: female Admission Date (Current Location): 07/26/2018  [facility] and [place] Number:  [employer] and Address:  The Center. [facility], [place] [address], [place], [place] [address]      Provider Number: [id number]  Attending Physician Name and Address:  Dessa Phi, DO  Relative Name and Phone Number:  [name]; sister; [phone]    Current Level of Care: Hospital Recommended Level of Care: [place] Prior Approval Number:    Date Approved/Denied:   PASRR Number: [id number]  Discharge Plan: SNF    Current Diagnoses: Patient Active Problem List   Diagnosis Date Noted  . Elevated LFTs 07/27/2018  . Aortic atherosclerosis ([place]) 07/27/2018  . 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 Cerebral thrombosis with cerebral infarction 05/11/2018  . CVA (cerebral vascular accident) ([place]) 05/11/2018  . Stroke-like symptoms 05/10/2018  . Hyperosmolar (nonketotic) coma ([place]) 04/15/2016  . Seizure ([place]) 04/15/2016  . AKI (acute kidney injury) ([place]) 04/15/2016  . Chronic diastolic heart failure ([place]) 04/15/2016  . Chronic diastolic CHF (congestive heart failure) ([place]) 06/04/2015  . Diverticulitis 05/28/2015  . Sigmoid diverticulitis 05/28/2015  . Hypokalemia 05/28/2015  . Diabetes mellitus type 2, controlled ([place]) 05/28/2015  . Chronic anemia 05/28/2015  . Diverticulitis of large intestine without perforation or abscess without bleeding   . Palpitation 04/12/2015  . Morbid obesity ([place]) 04/12/2015  . Mild aortic stenosis 04/12/2015  . Type 2 diabetes mellitus without complication ([place]) 60/73/7106  . Diabetes mellitus ([place]) 06/27/2014  . Hypertension 06/27/2014  . Chest pain 06/27/2014  . Asthma 06/27/2014    Orientation RESPIRATION BLADDER Height & Weight     Time, Situation, Place, Self  Normal Continent,  External catheter Weight: 253 lb 1.4 oz (114.8 kg) Height:  4\' 11"  (149.9 cm)  BEHAVIORAL SYMPTOMS/MOOD NEUROLOGICAL BOWEL NUTRITION STATUS      Continent Diet(see discharge summary)  AMBULATORY STATUS COMMUNICATION OF NEEDS Skin   Extensive Assist Verbally Normal                       Personal Care Assistance Level of Assistance  Bathing, Feeding, Dressing Bathing Assistance: Maximum assistance Feeding assistance: Independent Dressing Assistance: Maximum assistance     Functional Limitations Info  Sight, Hearing, Speech Sight Info: Impaired Hearing Info: Adequate Speech Info: Adequate    SPECIAL CARE FACTORS FREQUENCY  PT (By licensed PT), OT (By licensed OT)     PT Frequency: 5x week OT Frequency: 5x week            Contractures Contractures Info: Not present    Additional Factors Info  Insulin Sliding Scale, Code Status, Allergies Code Status Info: Full Code Allergies Info: CRESTOR ROSUVASTATIN, INDOMETHACIN, INVANZ ERTAPENEM SODIUM, LIPITOR ATORVASTATIN CALCIUM, REGLAN METOCLOPRAMIDE    Insulin Sliding Scale Info: insulin aspart (novoLOG) injection 0-9 Units every 4 hours; insulin glargine (LANTUS) injection 35 Units        Current Medications (07/29/2018):  This is the current hospital active medication list Current Facility-Administered Medications  Medication Dose Route Frequency Provider Last Rate Last Dose  . 0.9 %  sodium chloride infusion   Intravenous PRN Opyd, Ilene Qua, MD   Stopped at 07/29/18 0422  . 0.9 %  sodium chloride infusion   Intravenous Continuous Dessa Phi, DO 125 mL/hr at 07/29/18 1728    . acetaminophen (TYLENOL) tablet 650 mg  650 mg  Oral Q6H PRN Vianne Bulls, MD   650 mg at 07/27/18 0530   Or  . acetaminophen (TYLENOL) suppository 650 mg  650 mg Rectal Q6H PRN Opyd, Ilene Qua, MD      . hydrALAZINE (APRESOLINE) injection 10 mg  10 mg Intravenous Q4H PRN Dessa Phi, DO   10 mg at 07/29/18 1729  . insulin aspart (novoLOG)  injection 0-9 Units  0-9 Units Subcutaneous Q4H Opyd, Ilene Qua, MD   2 Units at 07/29/18 1234  . insulin glargine (LANTUS) injection 35 Units  35 Units Subcutaneous Daily Opyd, Ilene Qua, MD   35 Units at 07/29/18 1056  . morphine 2 MG/ML injection 1-3 mg  1-3 mg Intravenous Q4H PRN Opyd, Ilene Qua, MD   2 mg at 07/27/18 1523  . ondansetron (ZOFRAN) tablet 4 mg  4 mg Oral Q4H PRN Donnie Mesa, MD       Or  . ondansetron [facility]) injection 4 mg  4 mg Intravenous Q6H PRN Donnie Mesa, MD   4 mg at 07/29/18 0248  . piperacillin-tazobactam (ZOSYN) IVPB 3.375 g  3.375 g Intravenous Q8H Romona Curls, RPH 12.5 mL/hr at 07/29/18 1235 3.375 g at 07/29/18 1235  . polyethylene glycol (MIRALAX / GLYCOLAX) packet 17 g  17 g Oral BID Brahmbhatt, Parag, MD      . promethazine (PHENERGAN) injection 12.5 mg  12.5 mg Intravenous Q4H PRN Donnie Mesa, MD   12.5 mg at 07/29/18 0418  . simethicone (MYLICON) chewable tablet 80 mg  80 mg Oral Q6H PRN Donnie Mesa, MD         Discharge Medications: Please see discharge summary for a list of discharge medications.  Relevant Imaging Results:  Relevant Lab Results:   Additional Information SS#[id number] [place], [place]

## 2018-07-29 NOTE — Evaluation (Signed)
Physical Therapy Evaluation Patient Details Name: Sharon [name] MRN: [id number] DOB: Jun 08, 1946 Today's Date: 07/29/2018   History of Present Illness  Pt is a 73 y.o. F with significant PMH of chronic diastolic heart failure, type 2 diabetes insulin-dependent, CVA, atrial fibrillation who presents with abdominal pain. CT abdomen pelvis showed pneumatosis of colon.  Clinical Impression  Pt admitted with above diagnosis. Pt currently with functional limitations due to the deficits listed below (see PT Problem List). Patient presenting with decreased functional mobility secondary to decreased endurance, weakness, and abdominal pain. Requiring min assist for transfers and min guard for limited ambulation. Pt fatigues very quickly after transferring off of BSC and was requesting to lie back down. Recommending SNF at discharge to maximize functional independence. Pt will benefit from skilled PT to increase their independence and safety with mobility to allow discharge to the venue listed below.       Follow Up Recommendations SNF    Equipment Recommendations  None recommended by PT    Recommendations for Other Services       Precautions / Restrictions Precautions Precautions: Fall Restrictions Weight Bearing Restrictions: No      Mobility  Bed Mobility Overal bed mobility: Needs Assistance Bed Mobility: Supine to Sit;Sit to Supine     Supine to sit: Supervision Sit to supine: Supervision      Transfers Overall transfer level: Needs assistance Equipment used: Rolling walker (2 wheeled) Transfers: Sit to/from Stand Sit to Stand: Min assist         General transfer comment: minA to steady  Ambulation/Gait Ambulation/Gait assistance: Min guard Gait Distance (Feet): 3 Feet Assistive device: Rolling walker (2 wheeled) Gait Pattern/deviations: Step-through pattern;Trunk flexed;Decreased stride length Gait velocity: decreased Gait velocity interpretation: <1.31 ft/sec,  indicative of household ambulator General Gait Details: pt ambulating from bed <> BSC with min guard assist   Stairs            Wheelchair Mobility    Modified Rankin (Stroke Patients Only)       Balance Overall balance assessment: Needs assistance Sitting-balance support: Feet supported Sitting balance-Leahy Scale: Good     Standing balance support: Bilateral upper extremity supported Standing balance-Leahy Scale: Poor                               Pertinent Vitals/Pain Pain Assessment: Faces Faces Pain Scale: Hurts even more Pain Location: abdomen Pain Descriptors / Indicators: Grimacing;Moaning Pain Intervention(s): Monitored during session    Home Living Family/patient expects to be discharged to:: Private residence Living Arrangements: Other (Comment)(sister in Sports [employer]) Available Help at Discharge: Friend(s);Family;Available PRN/intermittently Type of Home: Apartment Home Access: Level entry     Home Layout: One level Home Equipment: Walker - 2 wheels;Grab bars - tub/shower;Grab bars - toilet;Cane - quad;Shower seat Additional Comments: Pt states she was not using DME prior to admission    Prior Function Level of Independence: Independent               Hand Dominance   Dominant Hand: Right    Extremity/Trunk Assessment   Upper Extremity Assessment Upper Extremity Assessment: Overall WFL for tasks assessed    Lower Extremity Assessment Lower Extremity Assessment: Generalized weakness    Cervical / Trunk Assessment Cervical / Trunk Assessment: Other exceptions Cervical / Trunk Exceptions: increased body habitus  Communication   Communication: No difficulties  Cognition Arousal/Alertness: Awake/alert Behavior During Therapy: WFL for tasks assessed/performed Overall Cognitive Status: Within Functional  Limits for tasks assessed                                 General Comments: Self limited by internal distractions       General Comments      Exercises     Assessment/Plan    PT Assessment Patient needs continued PT services  PT Problem List Decreased strength;Decreased activity tolerance;Decreased balance;Decreased mobility;Pain;Obesity       PT Treatment Interventions DME instruction;Gait training;Functional mobility training;Therapeutic activities;Therapeutic exercise;Balance training;Patient/family education    PT Goals (Current goals can be found in the Care Plan section)  Acute Rehab PT Goals Patient Stated Goal: "I may need rehab to get stronger." PT Goal Formulation: With patient Time For Goal Achievement: 09/01/18 Potential to Achieve Goals: Good    Frequency Min 3X/week   Barriers to discharge Decreased caregiver support      Co-evaluation               AM-PAC PT "6 Clicks" Mobility  Outcome Measure Help needed turning from your back to your side while in a flat bed without using bedrails?: None Help needed moving from lying on your back to sitting on the side of a flat bed without using bedrails?: None Help needed moving to and from a bed to a chair (including a wheelchair)?: A Little Help needed standing up from a chair using your arms (e.g., wheelchair or bedside chair)?: A Little Help needed to walk in hospital room?: A Little Help needed climbing 3-5 steps with a railing? : Total 6 Click Score: 18    End of Session   Activity Tolerance: Patient limited by pain Patient left: in bed;with call bell/phone within reach Nurse Communication: Mobility status PT Visit Diagnosis: Difficulty in walking, not elsewhere classified (R26.2);Pain Pain - part of body: (abdomen)    Time: 5053-9767 PT Time Calculation (min) (ACUTE ONLY): 25 min   Charges:   PT Evaluation $PT Eval Moderate Complexity: 1 Mod PT Treatments $Gait Training: 8-22 mins        Sharon [name], PT, DPT Acute Rehabilitation Services Pager [phone] Office [phone]  Sharon [name] 07/29/2018, 5:37 PM

## 2018-07-29 NOTE — Progress Notes (Addendum)
PROGRESS NOTE    Sharon [name]  ZTI:[id number] DOB: 16-Aug-1945 DOA: 07/26/2018 PCP: [name], MD     Brief Narrative:  Sharon [name] is a 73 yo female with past medical history significant for chronic diastolic heart failure, type 2 diabetes insulin-dependent, non-hemorrhagic CVA, atrial fibrillation seen on loop recorder reportedly, who presented with abdominal pain.  CT abdomen pelvis revealed pneumatosis of the colon of unclear etiology.  General surgery and GI have been consulted.  New events last 24 hours / Subjective: Nausea improved, continues to have abdominal distention with left lower quadrant abdominal pain.  Assessment & Plan:   Principal Problem:   Pneumatosis intestinalis Active Problems:   Type 2 diabetes mellitus without complication (HCC)   Hypokalemia   Diabetes mellitus type 2, controlled (HCC)   Chronic diastolic CHF (congestive heart failure) (HCC)   AKI (acute kidney injury) ([place])   CVA (cerebral vascular accident) ([place])   Elevated LFTs   Aortic atherosclerosis (HCC)   Colonic pneumatosis, ileus -CTwith pneumatosis involving the cecum, ascending colon and hepatic flexure of the colon -AXR 1/15 with persistent colonic obstruction concerning for distal neoplasm -AXR 1/16 Marked gaseous distention of the colon -General surgery and GI following -Continue IV Zosyn, IVF, Clear liquid diet   Acute kidney injury -Baseline creatinine 0.9, resolved  Insulin-dependent diabetes type 2 -Continue Lantus, sliding scale insulin  Chronic diastolic heart failure -Euvolemic currently continue to monitor  History of CVA -Continue Eliquis when able to take p.o.   Hypokalemia -Replace, trend   Elevated liver enzymes -Trending down, continue to monitor   Morbid obesity -Body mass index is 51.12 kg/m.    DVT prophylaxis: Eliquis when able, continue SCD Code Status: Full code Family Communication: No family at bedside Disposition Plan: Pending  clinical improvement   Consultants:   GI  General surgery  Procedures:   None  Antimicrobials:  Anti-infectives (From admission, onward)   Start     Dose/Rate Route Frequency Ordered Stop   07/27/18 0400  piperacillin-tazobactam (ZOSYN) IVPB 3.375 g     3.375 g 12.5 mL/hr over 240 Minutes Intravenous Every 8 hours 07/26/18 2203     07/26/18 2130  piperacillin-tazobactam (ZOSYN) IVPB 3.375 g     3.375 g 100 mL/hr over 30 Minutes Intravenous  Once 07/26/18 2129 07/26/18 2218       Objective: Vitals:   07/28/18 2018 07/28/18 2125 07/28/18 2230 07/29/18 0424  BP: (!) 194/85 (!) 195/82 (!) 166/82 (!) 148/72  Pulse: 70   79  Resp: (!) 22   (!) 21  Temp: 98.6 F (37 C)   98.4 F (36.9 C)  TempSrc: Oral   Oral  SpO2: 94%   94%  Weight:      Height:        Intake/Output Summary (Last 24 hours) at 07/29/2018 1324 Last data filed at 07/29/2018 0449 Gross per 24 hour  Intake 4120.72 ml  Output 400 ml  Net 3720.72 ml   Filed Weights   07/26/18 1636 07/27/18 0500 07/28/18 0500  Weight: 120.2 kg 115.4 kg 114.8 kg    Examination: General exam: Appears calm and comfortable  Respiratory system: Clear to auscultation. Respiratory effort normal. Cardiovascular system: S1 & S2 heard, RRR. No JVD, murmurs, rubs, gallops or clicks. No pedal edema. Gastrointestinal system: Abdomen is distended, tender to palpation right and left lower quadrant, hypoactive bowel sounds Central nervous system: Alert and oriented. No focal neurological deficits. Extremities: Symmetric 5 x 5 power. Skin: No rashes, lesions or ulcers  Psychiatry: Judgement and insight appear normal. Mood & affect appropriate.     Data Reviewed: I have personally reviewed following labs and imaging studies  CBC: Recent Labs  Lab 07/26/18 1700 07/26/18 1741 07/27/18 0339 07/28/18 0214 07/29/18 0235  WBC 16.3*  --  14.6* 13.4* 12.0*  NEUTROABS 13.5*  --  13.7*  --   --   HGB 11.8* 11.6* 12.1 10.1* 10.5*    HCT 38.1 34.0* 37.4 31.5* 32.8*  MCV 93.2  --  91.2 92.1 91.9  PLT 364  --  361 344 177   Basic Metabolic Panel: Recent Labs  Lab 07/26/18 1700 07/26/18 1741 07/27/18 0339 07/28/18 0214 07/29/18 0235  NA 139 139 142 148* 144  K 2.7* 3.0* 3.0* 3.3* 3.2*  CL 104 105 107 114* 116*  CO2 18*  --  21* 23 20*  GLUCOSE 236* 238* 231* 156* 174*  BUN 21 25* 21 26* 15  CREATININE 1.50* 0.90 1.31* 1.50* 0.97  CALCIUM 8.8*  --  8.8* 8.6* 8.6*  MG  --   --  2.5*  --   --    GFR: Estimated Creatinine Clearance: 61.2 mL/min (by C-G formula based on SCr of 0.97 mg/dL). Liver Function Tests: Recent Labs  Lab 07/26/18 1700 07/27/18 0339 07/28/18 0214 07/29/18 0235  AST 8* 145* 78* 41  ALT 9 61* 67* 59*  ALKPHOS 64 166* 140* 139*  BILITOT 1.4* 1.8* 0.8 0.4  PROT 6.6 6.6 5.8* 5.9*  ALBUMIN 3.0* 2.9* 2.4* 2.4*   Recent Labs  Lab 07/26/18 1700  LIPASE 22   No results for input(s): AMMONIA in the last 168 hours. Coagulation Profile: No results for input(s): INR, PROTIME in the last 168 hours. Cardiac Enzymes: No results for input(s): CKTOTAL, CKMB, CKMBINDEX, TROPONINI in the last 168 hours. BNP (last 3 results) No results for input(s): PROBNP in the last 8760 hours. HbA1C: No results for input(s): HGBA1C in the last 72 hours. CBG: Recent Labs  Lab 07/28/18 2013 07/29/18 0041 07/29/18 0425 07/29/18 0805 07/29/18 1217  GLUCAP 170* 151* 155* 132* 163*   Lipid Profile: No results for input(s): CHOL, HDL, LDLCALC, TRIG, CHOLHDL, LDLDIRECT in the last 72 hours. Thyroid Function Tests: No results for input(s): TSH, T4TOTAL, FREET4, T3FREE, THYROIDAB in the last 72 hours. Anemia Panel: No results for input(s): VITAMINB12, FOLATE, FERRITIN, TIBC, IRON, RETICCTPCT in the last 72 hours. Sepsis Labs: Recent Labs  Lab 07/26/18 1907 07/27/18 1322  LATICACIDVEN 1.03 1.4    Recent Results (from the past 240 hour(s))  Culture, Urine     Status: None   Collection Time: 07/27/18   4:51 PM  Result Value Ref Range Status   Specimen Description URINE, CATHETERIZED  Final   Special Requests NONE  Final   Culture   Final    NO GROWTH Performed at [facility], [place] [address], [place], [place] [address]    Report Status 07/28/2018 FINAL  Final       Radiology Studies: Dg Chest Port 1 View  Result Date: 07/27/2018 CLINICAL DATA:  Fever today. EXAM: PORTABLE CHEST 1 VIEW COMPARISON:  Single-view of the chest 05/10/2018 and 04/15/2016. CT chest 11/28/2015. FINDINGS: Chronically low lung volumes again seen. There is cardiomegaly and aortic atherosclerosis. No consolidative process, pneumothorax or effusion. No acute bony abnormality. IMPRESSION: No acute disease. Cardiomegaly. Atherosclerosis. Electronically Signed   By: Inge Rise M.D.   On: 07/27/2018 13:51   Dg Abd Portable 1v  Result Date: 07/29/2018 CLINICAL DATA:  Abdominal distension EXAM: PORTABLE ABDOMEN - 1 VIEW COMPARISON:  07/28/2018; CT abdomen and pelvis-07/26/2018 FINDINGS: There is persistent marked gaseous distension of the colon with the cecum measuring approximately 12.8 cm in diameter, unchanged. Nondiagnostic evaluation for pneumoperitoneum secondary to supine positioning and exclusion of the lower thorax. Previously questioned pneumatosis demonstrated on prior abdominal CT is not definitely demonstrated on the present examination. No definite portal venous gas. No definitive abnormal intra-abdominal calcifications. Degenerative change of the lower lumbar spine. IMPRESSION: Unchanged marked gaseous distention of colon with findings again worrisome for potential distal colonic obstruction. Electronically Signed   By: Sandi Mariscal M.D.   On: 07/29/2018 06:59   Dg Abd Portable 1v  Result Date: 07/28/2018 CLINICAL DATA:  Abdominal pain.  Nausea and vomiting. EXAM: PORTABLE ABDOMEN - 1 VIEW COMPARISON:  CT abdomen pelvis 07/26/2018 FINDINGS: There is continued diffuse dilation of the colon compatible  with a distal obstruction. No free air is present. Bibasilar airspace disease and effusions are noted. IMPRESSION: 1. 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 2. No evidence for free air or progressive pneumatosis. Electronically Signed   By: San Morelle M.D.   On: 07/28/2018 07:21      Scheduled Meds: . insulin aspart  0-9 Units Subcutaneous Q4H  . insulin glargine  35 Units Subcutaneous Daily  . polyethylene glycol  17 g Oral Daily   Continuous Infusions: . sodium chloride Stopped (07/29/18 0422)  . sodium chloride 125 mL/hr at 07/29/18 0840  . piperacillin-tazobactam (ZOSYN)  IV 3.375 g (07/29/18 1235)  . potassium chloride 10 mEq (07/29/18 1229)     LOS: 2 days    Time spent: 20 minutes   Dessa Phi, DO [facility] [email or web address] 07/29/2018, 1:24 PM

## 2018-07-30 ENCOUNTER — Inpatient Hospital Stay (HOSPITAL_COMMUNITY): Payer: Medicare Other

## 2018-07-30 ENCOUNTER — Other Ambulatory Visit: Payer: Self-pay | Admitting: Internal Medicine

## 2018-07-30 ENCOUNTER — Other Ambulatory Visit: Payer: Self-pay | Admitting: Gastroenterology

## 2018-07-30 ENCOUNTER — Encounter (HOSPITAL_COMMUNITY): Payer: Self-pay | Admitting: *Deleted

## 2018-07-30 ENCOUNTER — Encounter (HOSPITAL_COMMUNITY)
Admission: EM | Disposition: A | Discharge status: Home / Self Care | Payer: Self-pay | Source: Home / Self Care | Attending: Internal Medicine

## 2018-07-30 HISTORY — PX: FLEXIBLE SIGMOIDOSCOPY: SHX5431

## 2018-07-30 LAB — CBC
HCT: 32.9 % — ABNORMAL LOW (ref 36.0–46.0)
Hemoglobin: 10.3 g/dL — ABNORMAL LOW (ref 12.0–15.0)
MCH: 28.7 pg (ref 26.0–34.0)
MCHC: 31.3 g/dL (ref 30.0–36.0)
MCV: 91.6 fL (ref 80.0–100.0)
PLATELETS: 323 10*3/uL (ref 150–400)
RBC: 3.59 MIL/uL — ABNORMAL LOW (ref 3.87–5.11)
RDW: 13.6 % (ref 11.5–15.5)
WBC: 9.1 10*3/uL (ref 4.0–10.5)
nRBC: 0 % (ref 0.0–0.2)

## 2018-07-30 LAB — BASIC METABOLIC PANEL
Anion gap: 9 (ref 5–15)
BUN: 7 mg/dL — ABNORMAL LOW (ref 8–23)
CALCIUM: 8.6 mg/dL — AB (ref 8.9–10.3)
CO2: 18 mmol/L — ABNORMAL LOW (ref 22–32)
Chloride: 116 mmol/L — ABNORMAL HIGH (ref 98–111)
Creatinine, Ser: 0.9 mg/dL (ref 0.44–1.00)
GFR calc non Af Amer: >60 mL/min (ref >60)
Glucose, Bld: 97 mg/dL (ref 70–99)
Potassium: 3.2 mmol/L — ABNORMAL LOW (ref 3.5–5.1)
Sodium: 143 mmol/L (ref 135–145)

## 2018-07-30 LAB — GLUCOSE, CAPILLARY
GLUCOSE-CAPILLARY: 96 mg/dL (ref 70–99)
Glucose-Capillary: 104 mg/dL — ABNORMAL HIGH (ref 70–99)
Glucose-Capillary: 107 mg/dL — ABNORMAL HIGH (ref 70–99)
Glucose-Capillary: 113 mg/dL — ABNORMAL HIGH (ref 70–99)
Glucose-Capillary: 86 mg/dL (ref 70–99)
Glucose-Capillary: 91 mg/dL (ref 70–99)
Glucose-Capillary: 99 mg/dL (ref 70–99)

## 2018-07-30 LAB — PHOSPHORUS: Phosphorus: 1.9 mg/dL — ABNORMAL LOW (ref 2.5–4.6)

## 2018-07-30 LAB — MAGNESIUM: Magnesium: 2 mg/dL (ref 1.7–2.4)

## 2018-07-30 SURGERY — SIGMOIDOSCOPY, FLEXIBLE
Anesthesia: Moderate Sedation

## 2018-07-30 MED ORDER — MIDAZOLAM HCL (PF) 10 MG/2ML IJ SOLN
INTRAMUSCULAR | Status: DC | PRN
  Administered 2018-07-30: 2 mg via INTRAVENOUS
  Administered 2018-07-30: 1 mg via INTRAVENOUS

## 2018-07-30 MED ORDER — FENTANYL CITRATE (PF) 100 MCG/2ML IJ SOLN
INTRAMUSCULAR | Status: DC | PRN
  Administered 2018-07-30: 25 ug via INTRAVENOUS

## 2018-07-30 MED ORDER — POTASSIUM CHLORIDE 10 MEQ/100ML IV SOLN
10.0000 meq | INTRAVENOUS | Status: AC
  Administered 2018-07-30 (×6): 10 meq via INTRAVENOUS
  Filled 2018-07-30 (×3): qty 100

## 2018-07-30 MED ORDER — MIDAZOLAM HCL (PF) 5 MG/ML IJ SOLN
INTRAMUSCULAR | Status: AC
  Filled 2018-07-30: qty 2

## 2018-07-30 MED ORDER — FENTANYL CITRATE (PF) 100 MCG/2ML IJ SOLN
INTRAMUSCULAR | Status: AC
  Filled 2018-07-30: qty 2

## 2018-07-30 MED ORDER — MIDAZOLAM HCL (PF) 10 MG/2ML IJ SOLN
INTRAMUSCULAR | Status: DC | PRN
  Administered 2018-07-30: 1 mg via INTRAVENOUS

## 2018-07-30 NOTE — Progress Notes (Signed)
Sharon [name] 12:51 PM  Subjective: Patient seen and examined and case discussed with my partner Dr. Alessandra Bevels and her hospital computer chart reviewed and she tells me her main pain is in the left lower quadrant and she believes this is her second bout of diverticulitis since her stroke and one time she treated herself at home with antibiotics and she did not have a bowel movement for about a week before hospital admission and my 2 previous colonoscopies were reviewed and we were able to get the pediatric colonoscope easily passed the diverticular stricture and I had injected [place] ink just below that in the past in  case surgery was needed back then and the results of the flex sig today was reviewed and she has not had any vomiting today had some liquid bowel movements the last 2 days Objective: Vital signs stable afebrile no acute distress sitting comfortably in her chair abdomen is pertinent for mostly left-sided tenderness no rebound rare bowel sounds no tympany today white count decreased potassium a little low  Assessment: Questionable right-sided pneumatosis from stricture on left and partial obstruction  Plan: Increase potassium per hospital team and after my discussion with Dr. Penelope Coop who is on-call this weekend and next week we think a Gastrografin enema might delineate the anatomy better and the patient agrees and will go ahead and order it now  [facility]  Pager [phone] After 5PM or if no answer call [phone]

## 2018-07-30 NOTE — Progress Notes (Signed)
-  Patient seen and examined again.  She continues to have nausea and abdominal distention.  Passing gas.  X-ray showed minimal improvement in distention after flexible sigmoidoscopy.  Discussed with patient's niece.  They are requesting another flexible sigmoidoscopy for decompression because of ongoing abdominal distention and nausea.  -Plan for flexible sigmoidoscopy under monitored anesthesia care tomorrow.  -Tapwater enema in the morning. -N.p.o. past midnight. - GI will follow  Otis Brace MD, FACP 07/30/2018, 3:55 PM  Contact #  [phone]

## 2018-07-30 NOTE — Brief Op Note (Signed)
07/26/2018 - 07/30/2018  9:51 AM  PATIENT:  Sharon [name]  73 y.o. female  PRE-OPERATIVE DIAGNOSIS:  decompression  POST-OPERATIVE DIAGNOSIS:  * No post-op diagnosis entered *  PROCEDURE:  Procedure(s): FLEXIBLE SIGMOIDOSCOPY (N/A)  SURGEON:  Surgeon(s) and Role:    * Floraine Buechler, MD - Primary  Findings ---------- -Technically difficult procedure because of poor prep. 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  There was tattoo proximal to the stricture which was placed in the past by Dr. Watt Climes.  I was not able to advance scope beyond this area.   Recommendations -------------------------- -Check x-ray abdomen. -Clear liquid diet -If No improvement in symptoms, consider repeating flexible sigmoidoscopy after Fleet enemas over the weekend. -GI will follow  Otis Brace MD, [place] 07/30/2018, 9:53 AM  Contact #  [phone]

## 2018-07-30 NOTE — Progress Notes (Deleted)
CSW attempted to complete an assessment with the patient. Family was present in the room and asked the CSW to come back later.   CSW left SNF list in her shadow chart. CSW will come back later.   Domenic Schwab, MSW, [place]

## 2018-07-30 NOTE — Progress Notes (Signed)
Physical Therapy Treatment Patient Details Name: Sharon [name] MRN: [id number] DOB: 07-Dec-1945 Today's Date: 07/30/2018    History of Present Illness Pt is a 73 y.o. F with significant PMH of chronic diastolic heart failure, type 2 diabetes insulin-dependent, CVA, atrial fibrillation who presents with abdominal pain. CT abdomen pelvis showed pneumatosis of colon. Pt is s/p flexible sigmoidoscopy on 1/17, however, will likely have to have repeat procedure.     PT Comments    Pt with slow progression towards goals. Very limited this session secondary to fatigue and nausea. Pt able to perform stand pivot X2 to [facility] and then to chair with min guard A with RW. Current recommendations appropriate. Will continue to follow acutely to maximize functional mobility independence and safety.    Follow Up Recommendations  SNF     Equipment Recommendations  None recommended by PT    Recommendations for Other Services       Precautions / Restrictions Precautions Precautions: Fall Restrictions Weight Bearing Restrictions: No    Mobility  Bed Mobility Overal bed mobility: Needs Assistance Bed Mobility: Rolling;Sidelying to Sit Rolling: Supervision Sidelying to sit: Supervision       General bed mobility comments: Supervision for safety. Increased time required to come to sitting at EOB.   Transfers Overall transfer level: Needs assistance Equipment used: Rolling walker (2 wheeled) Transfers: Sit to/from [facility] Sit to Stand: Min assist Stand pivot transfers: Min guard       General transfer comment: Min A for steadying assist to stand. Verbal cues for safe hand placement. Min guard for steadying to perform stand pivot to [facility] and then to recliner. Further mobility limited secondary to nausea.   Ambulation/Gait                 Stairs             Wheelchair Mobility    Modified Rankin (Stroke Patients Only)       Balance Overall balance  assessment: Needs assistance Sitting-balance support: Feet supported Sitting balance-Leahy Scale: Good     Standing balance support: Bilateral upper extremity supported Standing balance-Leahy Scale: Poor Standing balance comment: Reliant on BUE support                             Cognition Arousal/Alertness: Awake/alert Behavior During Therapy: WFL for tasks assessed/performed Overall Cognitive Status: Within Functional Limits for tasks assessed                                        Exercises      General Comments        Pertinent Vitals/Pain Pain Assessment: Faces Faces Pain Scale: Hurts even more Pain Location: abdomen Pain Descriptors / Indicators: Grimacing;Moaning Pain Intervention(s): Limited activity within patient's tolerance;Monitored during session;Repositioned    Home Living                      Prior Function            PT Goals (current goals can now be found in the care plan section) Acute Rehab PT Goals Patient Stated Goal: to feel better PT Goal Formulation: With patient Time For Goal Achievement: 08-20-18 Potential to Achieve Goals: Good Progress towards PT goals: Progressing toward goals    Frequency    Min 2X/week  PT Plan Frequency needs to be updated    Co-evaluation              AM-PAC PT "6 Clicks" Mobility   Outcome Measure  Help needed turning from your back to your side while in a flat bed without using bedrails?: None Help needed moving from lying on your back to sitting on the side of a flat bed without using bedrails?: None Help needed moving to and from a bed to a chair (including a wheelchair)?: A Little Help needed standing up from a chair using your arms (e.g., wheelchair or bedside chair)?: A Little Help needed to walk in hospital room?: A Lot Help needed climbing 3-5 steps with a railing? : Total 6 Click Score: 17    End of Session   Activity Tolerance: Treatment  limited secondary to medical complications (Comment)(nausea) Patient left: in chair;with call bell/phone within reach Nurse Communication: Mobility status;Other (comment)(pt had BM ) PT Visit Diagnosis: Difficulty in walking, not elsewhere classified (R26.2);Pain Pain - part of body: (abdomen )     Time: 9373-4287 PT Time Calculation (min) (ACUTE ONLY): 18 min  Charges:  $Gait Training: 8-22 mins                     Leighton Ruff, PT, DPT  Acute Rehabilitation Services  Pager: [phone] Office: [phone]    Rudean Hitt 07/30/2018, 11:34 AM

## 2018-07-30 NOTE — Progress Notes (Signed)
Patient ID: Sharon [name], female   DOB: 07-16-1945, 73 y.o.   MRN: [id number]       Subjective: Patient still complains of nausea and just doesn't feel great today.  Passing flatus and had a BM yesterday.  Still feels distended and focally tender in LLQ.  Objective: Vital signs in last 24 hours: Temp:  [98.2 F (36.8 C)-98.5 F (36.9 C)] 98.5 F (36.9 C) (01/17 0516) Pulse Rate:  [64-81] 76 (01/17 0516) Resp:  [17] 17 (01/16 2026) BP: (154-186)/(68-86) 186/86 (01/17 0516) SpO2:  [94 %-97 %] 97 % (01/17 0516) Last BM Date: 07/29/18  Intake/Output from previous day: 01/16 0701 - 01/17 0700 In: 3394.1 [P.O.:480; I.V.:2217.8; IV Piggyback:696.3] Out: -  Intake/Output this shift: No intake/output data recorded.  PE: Gen: laying in bed in NAD Abd: morbidly obese, some BS, distended, especially in the upper abdomen.  Mostly with focal tenderness still in LLQ.  Lab Results:  Recent Labs    07/29/18 0235 07/30/18 0613  WBC 12.0* 9.1  HGB 10.5* 10.3*  HCT 32.8* 32.9*  PLT 357 323   BMET Recent Labs    07/29/18 0235 07/30/18 0613  NA 144 143  K 3.2* 3.2*  CL 116* 116*  CO2 20* 18*  GLUCOSE 174* 97  BUN 15 7*  CREATININE 0.97 0.90  CALCIUM 8.6* 8.6*   PT/INR No results for input(s): LABPROT, INR in the last 72 hours. CMP     Component Value Date/Time   NA 143 07/30/2018 0613   K 3.2 (L) 07/30/2018 0613   CL 116 (H) 07/30/2018 0613   CO2 18 (L) 07/30/2018 0613   GLUCOSE 97 07/30/2018 0613   BUN 7 (L) 07/30/2018 0613   CREATININE 0.90 07/30/2018 0613   CALCIUM 8.6 (L) 07/30/2018 0613   PROT 5.9 (L) 07/29/2018 0235   ALBUMIN 2.4 (L) 07/29/2018 0235   AST 41 07/29/2018 0235   ALT 59 (H) 07/29/2018 0235   ALKPHOS 139 (H) 07/29/2018 0235   BILITOT 0.4 07/29/2018 0235   GFRNONAA >60 07/30/2018 0613   GFRAA >60 07/30/2018 0613   Lipase     Component Value Date/Time   LIPASE 22 07/26/2018 1700       Studies/Results: Ct Abdomen Pelvis W  Contrast  Result Date: 07/29/2018 CLINICAL DATA:  Abdominal pain/distension EXAM: CT ABDOMEN AND PELVIS WITH CONTRAST TECHNIQUE: Multidetector CT imaging of the abdomen and pelvis was performed using the standard protocol following bolus administration of intravenous contrast. CONTRAST:  15mL OMNIPAQUE IOHEXOL 300 MG/ML  SOLN COMPARISON:  07/26/2018 FINDINGS: Lower chest: Lung bases are clear. Hepatobiliary: Liver is within normal limits. Status post cholecystectomy. No intrahepatic ductal dilatation. Dilated common duct, measuring 13 mm, although smoothly tapering at the ampulla. Pancreas: Within normal limits. Spleen: Within normal limits. Adrenals/Urinary Tract: 17 mm right adrenal nodule. Left adrenal glands are within normal limits. Bilateral renal cysts, measuring up to 16 mm in the medial right lower kidney. No hydronephrosis. Bladder is within normal limits. Stomach/Bowel: Stomach is within normal limits. No evidence of small bowel obstruction. Prior appendectomy. Multiple dilated loops of nondependent, gas-filled colon, suggesting adynamic colonic ileus. Near complete resolution of prior pneumatosis involving the cecum/ascending colon, with only minimal residual pneumatosis (series 3/image 66). No associated wall thickening or inflammatory changes. Vascular/Lymphatic: No evidence of abdominal aortic aneurysm. Mild atherosclerotic calcifications the abdominal aorta and branch vessels. Celiac artery, SMA, and IMA remain patent. No suspicious abdominopelvic lymphadenopathy. Reproductive: Status post hysterectomy. No adnexal masses. Other: No abdominopelvic ascites. No free  air. Small fat containing periumbilical hernia. Musculoskeletal: Degenerative changes of the visualized thoracolumbar spine. IMPRESSION: Near complete resolution of prior pneumatosis involving the cecum/ascending colon. No associated wall thickening or inflammatory changes. No free air. No evidence of bowel obstruction.  Prior  appendectomy. Suspected adynamic colonic ileus. Additional stable ancillary findings as above. Electronically Signed   By: Julian Hy M.D.   On: 07/29/2018 21:28   Dg Abd Portable 1v  Result Date: 07/29/2018 CLINICAL DATA:  Abdominal distension EXAM: PORTABLE ABDOMEN - 1 VIEW COMPARISON:  07/28/2018; CT abdomen and pelvis-07/26/2018 FINDINGS: There is persistent marked gaseous distension of the colon with the cecum measuring approximately 12.8 cm in diameter, unchanged. Nondiagnostic evaluation for pneumoperitoneum secondary to supine positioning and exclusion of the lower thorax. Previously questioned pneumatosis demonstrated on prior abdominal CT is not definitely demonstrated on the present examination. No definite portal venous gas. No definitive abnormal intra-abdominal calcifications. Degenerative change of the lower lumbar spine. IMPRESSION: Unchanged marked gaseous distention of colon with findings again worrisome for potential distal colonic obstruction. Electronically Signed   By: Sandi Mariscal M.D.   On: 07/29/2018 06:59    Anti-infectives: Anti-infectives (From admission, onward)   Start     Dose/Rate Route Frequency Ordered Stop   07/27/18 0400  piperacillin-tazobactam (ZOSYN) IVPB 3.375 g     3.375 g 12.5 mL/hr over 240 Minutes Intravenous Every 8 hours 07/26/18 2203     07/26/18 2130  piperacillin-tazobactam (ZOSYN) IVPB 3.375 g     3.375 g 100 mL/hr over 30 Minutes Intravenous  Once 07/26/18 2129 07/26/18 2218       Assessment/Plan Hx of CVA ?A.fib (seen on loop recorder per hospitalists note) HTN HLD Seizures CHF DM  Abdominal pain, adynamic colonic ileus -repeat CT overnight reveals resolved pneumatosis and no other pathology except for colonic distention such as colonic ileus.   -still with some nausea, abdominal distention, and LLQ abdominal pain.  Unclear why she has this focal pain as no pathology is noted on her CT scan to explain this.   -no surgical  indications noted at this time.  We will defer further management to GI regarding her colonic ileus.  We will sign off at this time.  FEN -CLD VTE -SCD, per medicine ID -Zosyn 1/13 >>   LOS: 3 days    Henreitta Cea , [facility] Surgery 07/30/2018, 7:40 AM Pager: [phone]

## 2018-07-30 NOTE — Progress Notes (Signed)
PROGRESS NOTE    Sharon [name]  WGN:[id number] DOB: Jun 22, 1946 DOA: 07/26/2018 PCP: [name], MD     Brief Narrative:  Sharon [name] is a 73 yo female with past medical history significant for chronic diastolic heart failure, type 2 diabetes insulin-dependent, non-hemorrhagic CVA, atrial fibrillation seen on loop recorder reportedly, who presented with abdominal pain.  CT abdomen pelvis revealed pneumatosis of the colon of unclear etiology.  General surgery and GI have been consulted.  New events last 24 hours / Subjective: Patient underwent flex sigmoidoscopy this morning.  Currently laying in bed, complains of continued left lower quadrant abdominal pain as well as nausea.  Assessment & Plan:   Principal Problem:   Pneumatosis intestinalis Active Problems:   Type 2 diabetes mellitus without complication (HCC)   Hypokalemia   Diabetes mellitus type 2, controlled (HCC)   Chronic diastolic CHF (congestive heart failure) (HCC)   AKI (acute kidney injury) ([place])   CVA (cerebral vascular accident) ([place])   Elevated LFTs   Aortic atherosclerosis (HCC)   Colonic pneumatosis, ileus -CTabdomen and pelvis showed pneumatosis involving the cecum, ascending colon and hepatic flexure of the colon -AXR 1/15 with persistent colonic obstruction concerning for distal neoplasm -AXR 1/16 Marked gaseous distention of the colon -Repeat CT abdomen pelvis revealed near complete resolution of pneumatosis, no free air, no evidence of bowel obstruction, suspected adynamic colonic ileus -General surgery and GI consulted -Underwent flex sigmoidoscopy 1/17, technically difficult procedure due to poor prep.  Planning on Gastrografin enema -Continue IV Zosyn, IVF, Clear liquid diet   Acute kidney injury -Baseline creatinine 0.9, resolved  Insulin-dependent diabetes type 2 -Continue Lantus, sliding scale insulin  Chronic diastolic heart failure -Euvolemic currently continue to monitor   History  of non-hemorrhagic CVA -Continue Eliquis when able to take p.o.   Hypokalemia -Replace, trend   Morbid obesity -Body mass index is 51.12 kg/m.    DVT prophylaxis: Eliquis when able, continue SCD Code Status: Full code Family Communication: No family at bedside Disposition Plan: Pending clinical improvement   Consultants:   GI  General surgery  Procedures:   Flex sigmoidoscopy 1/17  Antimicrobials:  Anti-infectives (From admission, onward)   Start     Dose/Rate Route Frequency Ordered Stop   07/27/18 0400  piperacillin-tazobactam (ZOSYN) IVPB 3.375 g     3.375 g 12.5 mL/hr over 240 Minutes Intravenous Every 8 hours 07/26/18 2203     07/26/18 2130  piperacillin-tazobactam (ZOSYN) IVPB 3.375 g     3.375 g 100 mL/hr over 30 Minutes Intravenous  Once 07/26/18 2129 07/26/18 2218       Objective: Vitals:   07/30/18 0905 07/30/18 0910 07/30/18 0915 07/30/18 0919  BP: (!) 165/57 (!) 176/59 (!) 168/49 (!) 167/66  Pulse: 70 76 70 71  Resp: 20 (!) 22 (!) 22 (!) 21  Temp:      TempSrc:      SpO2: 93% 96% 94% 94%  Weight:      Height:        Intake/Output Summary (Last 24 hours) at 07/30/2018 1334 Last data filed at 07/30/2018 0900 Gross per 24 hour  Intake 3404.09 ml  Output -  Net 3404.09 ml   Filed Weights   07/26/18 1636 07/27/18 0500 07/28/18 0500  Weight: 120.2 kg 115.4 kg 114.8 kg    Examination: General exam: Appears calm, mildly uncomfortable Respiratory system: Clear to auscultation. Respiratory effort normal. Cardiovascular system: S1 & S2 heard, RRR. No JVD, murmurs, rubs, gallops or clicks. No pedal edema.  Gastrointestinal system: Abdomen is distended, tender to palpation bilateral lower quadrants. No organomegaly or masses felt. +bowel sounds heard. Central nervous system: Alert and oriented. No focal neurological deficits. Extremities: Symmetric 5 x 5 power. Skin: No rashes, lesions or ulcers Psychiatry: Judgement and insight appear normal.  Mood & affect appropriate.    Data Reviewed: I have personally reviewed following labs and imaging studies  CBC: Recent Labs  Lab 07/26/18 1700 07/26/18 1741 07/27/18 0339 07/28/18 0214 07/29/18 0235 07/30/18 0613  WBC 16.3*  --  14.6* 13.4* 12.0* 9.1  NEUTROABS 13.5*  --  13.7*  --   --   --   HGB 11.8* 11.6* 12.1 10.1* 10.5* 10.3*  HCT 38.1 34.0* 37.4 31.5* 32.8* 32.9*  MCV 93.2  --  91.2 92.1 91.9 91.6  PLT 364  --  361 344 357 465   Basic Metabolic Panel: Recent Labs  Lab 07/26/18 1700 07/26/18 1741 07/27/18 0339 07/28/18 0214 07/29/18 0235 07/30/18 0613  NA 139 139 142 148* 144 143  K 2.7* 3.0* 3.0* 3.3* 3.2* 3.2*  CL 104 105 107 114* 116* 116*  CO2 18*  --  21* 23 20* 18*  GLUCOSE 236* 238* 231* 156* 174* 97  BUN 21 25* 21 26* 15 7*  CREATININE 1.50* 0.90 1.31* 1.50* 0.97 0.90  CALCIUM 8.8*  --  8.8* 8.6* 8.6* 8.6*  MG  --   --  2.5*  --   --  2.0  PHOS  --   --   --   --   --  1.9*   GFR: Estimated Creatinine Clearance: 65.9 mL/min (by C-G formula based on SCr of 0.9 mg/dL). Liver Function Tests: Recent Labs  Lab 07/26/18 1700 07/27/18 0339 07/28/18 0214 07/29/18 0235  AST 8* 145* 78* 41  ALT 9 61* 67* 59*  ALKPHOS 64 166* 140* 139*  BILITOT 1.4* 1.8* 0.8 0.4  PROT 6.6 6.6 5.8* 5.9*  ALBUMIN 3.0* 2.9* 2.4* 2.4*   Recent Labs  Lab 07/26/18 1700  LIPASE 22   No results for input(s): AMMONIA in the last 168 hours. Coagulation Profile: No results for input(s): INR, PROTIME in the last 168 hours. Cardiac Enzymes: No results for input(s): CKTOTAL, CKMB, CKMBINDEX, TROPONINI in the last 168 hours. BNP (last 3 results) No results for input(s): PROBNP in the last 8760 hours. HbA1C: No results for input(s): HGBA1C in the last 72 hours. CBG: Recent Labs  Lab 07/29/18 2012 07/30/18 0001 07/30/18 0421 07/30/18 0822 07/30/18 1203  GLUCAP 118* 96 91 86 107*   Lipid Profile: No results for input(s): CHOL, HDL, LDLCALC, TRIG, CHOLHDL, LDLDIRECT  in the last 72 hours. Thyroid Function Tests: No results for input(s): TSH, T4TOTAL, FREET4, T3FREE, THYROIDAB in the last 72 hours. Anemia Panel: No results for input(s): VITAMINB12, FOLATE, FERRITIN, TIBC, IRON, RETICCTPCT in the last 72 hours. Sepsis Labs: Recent Labs  Lab 07/26/18 1907 07/27/18 1322  LATICACIDVEN 1.03 1.4    Recent Results (from the past 240 hour(s))  Culture, Urine     Status: None   Collection Time: 07/27/18  4:51 PM  Result Value Ref Range Status   Specimen Description URINE, CATHETERIZED  Final   Special Requests NONE  Final   Culture   Final    NO GROWTH Performed at [facility], [place] [address], [place], [place] [address]    Report Status 07/28/2018 FINAL  Final       Radiology Studies: Ct Abdomen Pelvis W Contrast  Result Date:  07/29/2018 CLINICAL DATA:  Abdominal pain/distension EXAM: CT ABDOMEN AND PELVIS WITH CONTRAST TECHNIQUE: Multidetector CT imaging of the abdomen and pelvis was performed using the standard protocol following bolus administration of intravenous contrast. CONTRAST:  166mL OMNIPAQUE IOHEXOL 300 MG/ML  SOLN COMPARISON:  07/26/2018 FINDINGS: Lower chest: Lung bases are clear. Hepatobiliary: Liver is within normal limits. Status post cholecystectomy. No intrahepatic ductal dilatation. Dilated common duct, measuring 13 mm, although smoothly tapering at the ampulla. Pancreas: Within normal limits. Spleen: Within normal limits. Adrenals/Urinary Tract: 17 mm right adrenal nodule. Left adrenal glands are within normal limits. Bilateral renal cysts, measuring up to 16 mm in the medial right lower kidney. No hydronephrosis. Bladder is within normal limits. Stomach/Bowel: Stomach is within normal limits. No evidence of small bowel obstruction. Prior appendectomy. Multiple dilated loops of nondependent, gas-filled colon, suggesting adynamic colonic ileus. Near complete resolution of prior pneumatosis involving the cecum/ascending colon, with  only minimal residual pneumatosis (series 3/image 66). No associated wall thickening or inflammatory changes. Vascular/Lymphatic: No evidence of abdominal aortic aneurysm. Mild atherosclerotic calcifications the abdominal aorta and branch vessels. Celiac artery, SMA, and IMA remain patent. No suspicious abdominopelvic lymphadenopathy. Reproductive: Status post hysterectomy. No adnexal masses. Other: No abdominopelvic ascites. No free air. Small fat containing periumbilical hernia. Musculoskeletal: Degenerative changes of the visualized thoracolumbar spine. IMPRESSION: Near complete resolution of prior pneumatosis involving the cecum/ascending colon. No associated wall thickening or inflammatory changes. No free air. No evidence of bowel obstruction.  Prior appendectomy. Suspected adynamic colonic ileus. Additional stable ancillary findings as above. Electronically Signed   By: Julian Hy M.D.   On: 07/29/2018 21:28   Dg Abd Portable 1v  Result Date: 07/30/2018 CLINICAL DATA:  Abdominal distension. EXAM: PORTABLE ABDOMEN - 1 VIEW COMPARISON:  Radiograph and CT scan July 29, 2018. FINDINGS: No small bowel dilatation is noted. Colonic dilatation is again noted, although the cecum appears to be decreased in caliber, although is not completely included in field-of-view. Transverse colon measures 11.5 cm in diameter. No radio-opaque calculi or other significant radiographic abnormality are seen. IMPRESSION: Colonic dilatation is noted, but may be slightly improved compared to prior exam. Electronically Signed   By: Marijo Conception, M.D.   On: 07/30/2018 10:11   Dg Abd Portable 1v  Result Date: 07/29/2018 CLINICAL DATA:  Abdominal distension EXAM: PORTABLE ABDOMEN - 1 VIEW COMPARISON:  07/28/2018; CT abdomen and pelvis-07/26/2018 FINDINGS: There is persistent marked gaseous distension of the colon with the cecum measuring approximately 12.8 cm in diameter, unchanged. Nondiagnostic evaluation for  pneumoperitoneum secondary to supine positioning and exclusion of the lower thorax. Previously questioned pneumatosis demonstrated on prior abdominal CT is not definitely demonstrated on the present examination. No definite portal venous gas. No definitive abnormal intra-abdominal calcifications. Degenerative change of the lower lumbar spine. IMPRESSION: Unchanged marked gaseous distention of colon with findings again worrisome for potential distal colonic obstruction. Electronically Signed   By: Sandi Mariscal M.D.   On: 07/29/2018 06:59      Scheduled Meds: . insulin aspart  0-9 Units Subcutaneous Q4H  . insulin glargine  35 Units Subcutaneous Daily  . polyethylene glycol  17 g Oral BID   Continuous Infusions: . sodium chloride Stopped (07/30/18 0508)  . sodium chloride 125 mL/hr at 07/30/18 0933  . piperacillin-tazobactam (ZOSYN)  IV 12.5 mL/hr at 07/30/18 0510  . potassium chloride 10 mEq (07/30/18 1213)     LOS: 3 days    Time spent: 20 minutes   Dessa Phi, DO [facility] [email or web address] 07/30/2018, 1:34 PM

## 2018-07-30 NOTE — Clinical Social Work Note (Signed)
Clinical Social Work Assessment  Patient Details  Name: Sharon [name] MRN: [id number] Date of Birth: 1946/06/30  Date of referral:  07/30/18               Reason for consult:  Discharge Planning                Permission sought to share information with:  Case Manager Permission granted to share information::  Yes, Verbal Permission Granted  Name::     Sharon [name]  Agency::     Relationship::  Niece  Contact Information:     Housing/Transportation Living arrangements for the past 2 months:  Single Family Home Source of Information:  Power of Attorney Patient Interpreter Needed:  None Criminal Activity/Legal Involvement Pertinent to Current Situation/Hospitalization:  No - Comment as needed Significant Relationships:  Siblings, Other(Comment)(Niece, POA) Lives with:    Do you feel safe going back to the place where you live?  Yes Need for family participation in patient care:  Yes (Comment)  Care giving concerns:    CSW met with niece outside of the room. Patient's niece, Sharon [name], is patient's healthcare power of attorney. Patient's niece stated that the patient lives with her sister. Patient's niece is concerned about her aunt. She reported that her aunt does not properly take care of herself. She stated that she has diabetes and high blood pressure and that she does not eat a healthy diet. She has concerns about her aunt's depression. Patient's niece stated that her aunt has had depression and that she is attention seeking with it. She tries not to overstep but her aunt is working against her when it comes to making medical decisions. For example, the patient does not want any pain medication when her niece is around, but then will ask the nurse for pain medication. Patient's niece wants her aunt to go to a skilled nursing facility because she believes that her aunt could benefit from receiving some additional resources. Patient lives with her sister, patients sister is the nieces mother.  Patients niece stated that her mother could not help care for the patient at home.   Patient's niece stated that she would talk to her aunt and try to get her to agree to a skilled nursing facility placement. She asked for a few days to decide on a facility.    Social Worker assessment / plan:    Sharon [name] has already been completed and patient was faxed out. CSW completed the assessment with the patient's niece and provided a SNF list to the patient's niece. Also placed a second copy in the patients shadow chart.   Employment status:  Retired Forensic scientist:  Medicare PT Recommendations:  [place] / Referral to community resources:  [place]  Patient/Family's Response to care:  Patient's niece is accepting of her aunt needing to go to a skilled nursing facility. She believes that she could benefit from receiving additional therapies at a skilled nursing facility.   Patient/Family's Understanding of and Emotional Response to Diagnosis, Current Treatment, and Prognosis:  Family is aware of current medical condition and is supportive of the patient wanting to get better. Also concerned that the patient can make the wrong decisions regarding her own medical care.   Emotional Assessment Appearance:  Appears stated age Attitude/Demeanor/Rapport:  Unable to Assess Affect (typically observed):  Unable to Assess Orientation:  Oriented to Self, Oriented to Place, Oriented to  Time, Oriented to Situation Alcohol / Substance use:  Not Applicable, Alcohol  Use Psych involvement (Current and /or in the community):     Discharge Needs  Concerns to be addressed:  Discharge Planning Concerns Readmission within the last 30 days:  No Current discharge risk:  Dependent with Mobility Barriers to Discharge:  Continued Medical Work up   [employer], [place] 07/30/2018, 3:38 PM

## 2018-07-30 NOTE — Op Note (Signed)
[facility] Patient Name: Sharon [name] Procedure Date : 07/30/2018 MRN: [id number] Attending MD: Otis Brace , MD Date of Birth: 08/21/1945 CSN: [id number] Age: 73 Admit Type: Inpatient Procedure:                Flexible Sigmoidoscopy Indications:              Abnormal CT of the GI tract, Constipation Providers:                Otis Brace, MD, Carlyn Reichert, RN, Dorise Hiss, RN, Charolette Child, Technician Referring MD:              Medicines:                Fentanyl 25 micrograms IV, Midazolam 4 mg IV Complications:            No immediate complications. Estimated Blood Loss:     Estimated blood loss: none. Procedure:                Pre-Anesthesia Assessment:                           - Prior to the procedure, a History and Physical                            was performed, and patient medications and                            allergies were reviewed. The patient's tolerance of                            previous anesthesia was also reviewed. The risks                            and benefits of the procedure and the sedation                            options and risks were discussed with the patient.                            All questions were answered, and informed consent                            was obtained. Prior Anticoagulants: The patient has                            taken no previous anticoagulant or antiplatelet                            agents. ASA Grade Assessment: II - A patient with                            mild systemic disease. After reviewing the risks  and benefits, the patient was deemed in                            satisfactory condition to undergo the procedure.                           After obtaining informed consent, the scope was                            passed under direct vision. The GIF-H190 ([id number])                            Olympus gastroscope was introduced  through the anus                            and advanced to the 30 cm from the anal verge. The                            flexible sigmoidoscopy was technically difficult                            and complex due to bowel stenosis. The quality of                            the bowel preparation was poor. Scope In: Scope Out: Findings:      The perianal exam findings include hemorrhoids. There was technical       error and pictures were not stored.      A large amount of stool was found in the rectum, in the recto-sigmoid       colon and in the sigmoid colon, interfering with visualization.      A tattoo was seen at 30 cm proximal to the anus. ( Tattoo was placed by       Dr. Watt Climes in the past proximal to dievrticular stricture)      A benign-appearing, intrinsic moderate stenosis was found at 30 cm       proximal to the anus and was non-traversed. Impression:               - Preparation of the colon was poor.                           - No specimens collected. Recommendation:           - Return patient to hospital ward for ongoing care.                           - Resume previous diet.                           - Repeat flexible sigmoidoscopy tomorrow for                            retreatment. Procedure Code(s):        --- Professional ---  03559, Sigmoidoscopy, flexible; diagnostic,                            including collection of specimen(s) by brushing or                            washing, when performed (separate procedure) Diagnosis Code(s):        --- Professional ---                           K59.00, Constipation, unspecified                           R93.3, Abnormal findings on diagnostic imaging of                            other parts of digestive tract CPT copyright 2018 American Medical Association. All rights reserved. The codes documented in this report are preliminary and upon coder review may  be revised to meet current compliance  requirements. Otis Brace, MD Otis Brace, MD 07/30/2018 9:51:08 AM Number of Addenda: 0

## 2018-07-30 NOTE — Progress Notes (Signed)
[facility] Gastroenterology Progress Note  Sharon [name] 73 y.o. [birth year]  CC: Nausea, vomiting, abdominal pain and abnormal CT.   Subjective: She continues to have nausea.  Stated that she was not feeling well overnight.  Passing flatus.  Last bowel movement last night.    Objective: Vital signs in last 24 hours: Vitals:   07/29/18 2026 07/30/18 0516  BP: (!) 154/68 (!) 186/86  Pulse: 81 76  Resp: 17   Temp: 98.2 F (36.8 C) 98.5 F (36.9 C)  SpO2: 96% 97%    Physical Exam:  General.  Obese patient currently not in acute distress. Abdomen.  Distended with left lower quadrant tenderness to palpation, hypoactive bowel sounds, no peritoneal signs Psych.  Mood and affect normal. Neuro.  A/O  x3.  Lab Results: Recent Labs    07/29/18 0235 07/30/18 0613  NA 144 143  K 3.2* 3.2*  CL 116* 116*  CO2 20* 18*  GLUCOSE 174* 97  BUN 15 7*  CREATININE 0.97 0.90  CALCIUM 8.6* 8.6*   Recent Labs    07/28/18 0214 07/29/18 0235  AST 78* 41  ALT 67* 59*  ALKPHOS 140* 139*  BILITOT 0.8 0.4  PROT 5.8* 5.9*  ALBUMIN 2.4* 2.4*   Recent Labs    07/29/18 0235 07/30/18 0613  WBC 12.0* 9.1  HGB 10.5* 10.3*  HCT 32.8* 32.9*  MCV 91.9 91.6  PLT 357 323   No results for input(s): LABPROT, INR in the last 72 hours.    Assessment/Plan: -Left-sided abdominal pain with CT scan showing pneumatosis involving cecum, ascending colon and hepatic flexure.  Patient has mild elevated LFTs as well as mild elevation in kidney function.  Possibility of small ischemic event involving small bowel and right colon cannot be ruled out.  Recent CT scan suggests no evidence of major vascular occlusion.  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  Improving -Abnormal LFTs.  Improving.  Recommendations: ------------------------- -CT Angio abdomen pelvis was ordered yesterday but somehow it was canceled and CT abdomen pelvis with IV contrast was  performed which showed near complete resolution of pneumatosis and no evidence of bowel obstruction.  -She continues to have nausea and significant abdominal distention.  -Management options such as conservative management as well as flexible sigmoidoscopy for decompression discussed with the patient.  She prefers sigmoidoscopy .  -We will plan for flexible sigmoidoscopy for decompression today.  -Replace electrolytes.  Recommend potassium at 4 or above. -Continue supportive care. - GI will follow.   Otis Brace MD, [place] 07/30/2018, 8:31 AM  Contact #  [phone]

## 2018-07-30 NOTE — Care Management Important Message (Signed)
Important Message  Patient Details  Name: Sharon [name] MRN: [id number] Date of Birth: 1946/06/23   Medicare Important Message Given:  Yes    Orbie Pyo 07/30/2018, 4:28 PM

## 2018-07-31 ENCOUNTER — Inpatient Hospital Stay (HOSPITAL_COMMUNITY): Payer: Medicare Other | Admitting: Certified Registered Nurse Anesthetist

## 2018-07-31 ENCOUNTER — Encounter (HOSPITAL_COMMUNITY): Payer: Self-pay | Admitting: Anesthesiology

## 2018-07-31 ENCOUNTER — Encounter (HOSPITAL_COMMUNITY)
Admission: EM | Disposition: A | Discharge status: Home / Self Care | Payer: Self-pay | Source: Home / Self Care | Attending: Internal Medicine

## 2018-07-31 HISTORY — PX: FLEXIBLE SIGMOIDOSCOPY: SHX5431

## 2018-07-31 LAB — BASIC METABOLIC PANEL
Anion gap: 11 (ref 5–15)
BUN: 8 mg/dL (ref 8–23)
CALCIUM: 8.9 mg/dL (ref 8.9–10.3)
CO2: 17 mmol/L — ABNORMAL LOW (ref 22–32)
Chloride: 116 mmol/L — ABNORMAL HIGH (ref 98–111)
Creatinine, Ser: 0.91 mg/dL (ref 0.44–1.00)
GFR calc Af Amer: >60 mL/min (ref >60)
Glucose, Bld: 111 mg/dL — ABNORMAL HIGH (ref 70–99)
Potassium: 4 mmol/L (ref 3.5–5.1)
Sodium: 144 mmol/L (ref 135–145)

## 2018-07-31 LAB — GLUCOSE, CAPILLARY
GLUCOSE-CAPILLARY: 119 mg/dL — AB (ref 70–99)
Glucose-Capillary: 114 mg/dL — ABNORMAL HIGH (ref 70–99)
Glucose-Capillary: 109 mg/dL — ABNORMAL HIGH (ref 70–99)
Glucose-Capillary: 99 mg/dL (ref 70–99)
Glucose-Capillary: 109 mg/dL — ABNORMAL HIGH (ref 70–99)

## 2018-07-31 SURGERY — SIGMOIDOSCOPY, FLEXIBLE
Anesthesia: Monitor Anesthesia Care

## 2018-07-31 MED ORDER — PROPOFOL 500 MG/50ML IV EMUL
INTRAVENOUS | Status: DC | PRN
  Administered 2018-07-31: 50 ug/kg/min via INTRAVENOUS

## 2018-07-31 MED ORDER — ONDANSETRON HCL 4 MG/2ML IJ SOLN
INTRAMUSCULAR | Status: DC | PRN
  Administered 2018-07-31: 4 mg via INTRAVENOUS

## 2018-07-31 MED ORDER — PROPOFOL 10 MG/ML IV BOLUS
INTRAVENOUS | Status: DC | PRN
  Administered 2018-07-31: 20 mg via INTRAVENOUS

## 2018-07-31 MED ORDER — SODIUM CHLORIDE 0.9 % IV SOLN: INTRAVENOUS | Status: DC

## 2018-07-31 NOTE — Progress Notes (Addendum)
PROGRESS NOTE    Sharon [name]  YSA:[id number] DOB: [birth year] DOA: 07/26/2018 PCP: [name], MD     Brief Narrative:  Sharon [name] is a 73 yo female with past medical history significant for chronic diastolic heart failure, type 2 diabetes insulin-dependent, non-hemorrhagic CVA, atrial fibrillation seen on loop recorder reportedly, who presented with abdominal pain.  CT abdomen pelvis revealed pneumatosis of the colon of unclear etiology.  General surgery and GI have been consulted.  She underwent flex sigmoidoscopy on 1/17 and 1/18 which revealed severe stricture in the sigmoid colon.  General surgery was reconsulted.  New events last 24 hours / Subjective: Continues to have abdominal distention, left lower quadrant pain, nausea has been intermittent  Assessment & Plan:   Principal Problem:   Pneumatosis intestinalis Active Problems:   Type 2 diabetes mellitus without complication (HCC)   Hypokalemia   Diabetes mellitus type 2, controlled (HCC)   Chronic diastolic CHF (congestive heart failure) (HCC)   AKI (acute kidney injury) ([place])   CVA (cerebral vascular accident) ([place])   Elevated LFTs   Aortic atherosclerosis (HCC)   Severe colonic stricture -CTabdomen and pelvis showed pneumatosis involving the cecum, ascending colon and hepatic flexure of the colon -AXR 1/15 with persistent colonic obstruction concerning for distal neoplasm -AXR 1/16 Marked gaseous distention of the colon -Repeat CT abdomen pelvis revealed near complete resolution of pneumatosis, no free air, no evidence of bowel obstruction, suspected adynamic colonic ileus -Underwent flex sigmoidoscopy 1/17, technically difficult procedure due to poor prep.  Planning on Gastrografin enema -Underwent flex sigmoidoscopy 1/18 which revealed stricture in the sigmoid colon -GI, general surgery following -IVF, NPO, ?Zosyn initially started for concern of pneumatosis coli and intra-abd infection. Likely can  discontinue soon   Acute kidney injury -Baseline creatinine 0.9, resolved  Insulin-dependent diabetes type 2 -Continue Lantus, sliding scale insulin  Chronic diastolic heart failure -Euvolemic currently continue to monitor   History of non-hemorrhagic CVA Paroxysmal A Fib -Continue Eliquis when able to take p.o.   Morbid obesity -Body mass index is 51.12 kg/m.    DVT prophylaxis: Eliquis when able, continue SCD Code Status: Full code Family Communication: At bedside Disposition Plan: Pending surgical plan, SNF once patient has clinically improved   Consultants:   GI  General surgery  Procedures:   Flex sigmoidoscopy 1/17  Flex sigmoidoscopy 1/18  Antimicrobials:  Anti-infectives (From admission, onward)   Start     Dose/Rate Route Frequency Ordered Stop   07/27/18 0400  piperacillin-tazobactam (ZOSYN) IVPB 3.375 g     3.375 g 12.5 mL/hr over 240 Minutes Intravenous Every 8 hours 07/26/18 2203     07/26/18 2130  piperacillin-tazobactam (ZOSYN) IVPB 3.375 g     3.375 g 100 mL/hr over 30 Minutes Intravenous  Once 07/26/18 2129 07/26/18 2218       Objective: Vitals:   07/31/18 0357 07/31/18 0823 07/31/18 0943 07/31/18 0952  BP: (!) 153/81 (!) 179/80 (!) 151/78 (!) 187/80  Pulse: 75 63 67   Resp: 17 19 15    Temp: 98.1 F (36.7 C) 98 F (36.7 C) 97.9 F (36.6 C)   TempSrc: Oral Oral Oral   SpO2: 97% 98% 99%   Weight:  114.8 kg    Height:  4\' 11"  (1.499 m)      Intake/Output Summary (Last 24 hours) at 07/31/2018 1215 Last data filed at 07/31/2018 0934 Gross per 24 hour  Intake 2183.09 ml  Output 1 ml  Net 2182.09 ml   [employer]  07/27/18 0500 07/28/18 0500 07/31/18 0823  Weight: 115.4 kg 114.8 kg 114.8 kg    Examination: General exam: Appears calm but uncomfortable   Respiratory system: Clear to auscultation. Respiratory effort normal. Cardiovascular system: S1 & S2 heard, RRR. No JVD, murmurs, rubs, gallops or clicks. No pedal  edema. Gastrointestinal system: Abdomen is distended, tender to palpation left lower quadrant, hypoactive bowel sound Central nervous system: Alert and oriented. No focal neurological deficits. Extremities: Symmetric 5 x 5 power. Skin: No rashes, lesions or ulcers Psychiatry: Judgement and insight appear normal. Mood & affect appropriate.    Data Reviewed: I have personally reviewed following labs and imaging studies  CBC: Recent Labs  Lab 07/26/18 1700 07/26/18 1741 07/27/18 0339 07/28/18 0214 07/29/18 0235 07/30/18 0613  WBC 16.3*  --  14.6* 13.4* 12.0* 9.1  NEUTROABS 13.5*  --  13.7*  --   --   --   HGB 11.8* 11.6* 12.1 10.1* 10.5* 10.3*  HCT 38.1 34.0* 37.4 31.5* 32.8* 32.9*  MCV 93.2  --  91.2 92.1 91.9 91.6  PLT 364  --  361 344 357 109   Basic Metabolic Panel: Recent Labs  Lab 07/27/18 0339 07/28/18 0214 07/29/18 0235 07/30/18 0613 07/31/18 0250  NA 142 148* 144 143 144  K 3.0* 3.3* 3.2* 3.2* 4.0  CL 107 114* 116* 116* 116*  CO2 21* 23 20* 18* 17*  GLUCOSE 231* 156* 174* 97 111*  BUN 21 26* 15 7* 8  CREATININE 1.31* 1.50* 0.97 0.90 0.91  CALCIUM 8.8* 8.6* 8.6* 8.6* 8.9  MG 2.5*  --   --  2.0  --   PHOS  --   --   --  1.9*  --    GFR: Estimated Creatinine Clearance: 65.2 mL/min (by C-G formula based on SCr of 0.91 mg/dL). Liver Function Tests: Recent Labs  Lab 07/26/18 1700 07/27/18 0339 07/28/18 0214 07/29/18 0235  AST 8* 145* 78* 41  ALT 9 61* 67* 59*  ALKPHOS 64 166* 140* 139*  BILITOT 1.4* 1.8* 0.8 0.4  PROT 6.6 6.6 5.8* 5.9*  ALBUMIN 3.0* 2.9* 2.4* 2.4*   Recent Labs  Lab 07/26/18 1700  LIPASE 22   No results for input(s): AMMONIA in the last 168 hours. Coagulation Profile: No results for input(s): INR, PROTIME in the last 168 hours. Cardiac Enzymes: No results for input(s): CKTOTAL, CKMB, CKMBINDEX, TROPONINI in the last 168 hours. BNP (last 3 results) No results for input(s): PROBNP in the last 8760 hours. HbA1C: No results for  input(s): HGBA1C in the last 72 hours. CBG: Recent Labs  Lab 07/30/18 2008 07/30/18 2351 07/31/18 0356 07/31/18 0802 07/31/18 1157  GLUCAP 99 113* 114* 109* 99   Lipid Profile: No results for input(s): CHOL, HDL, LDLCALC, TRIG, CHOLHDL, LDLDIRECT in the last 72 hours. Thyroid Function Tests: No results for input(s): TSH, T4TOTAL, FREET4, T3FREE, THYROIDAB in the last 72 hours. Anemia Panel: No results for input(s): VITAMINB12, FOLATE, FERRITIN, TIBC, IRON, RETICCTPCT in the last 72 hours. Sepsis Labs: Recent Labs  Lab 07/26/18 1907 07/27/18 1322  LATICACIDVEN 1.03 1.4    Recent Results (from the past 240 hour(s))  Culture, Urine     Status: None   Collection Time: 07/27/18  4:51 PM  Result Value Ref Range Status   Specimen Description URINE, CATHETERIZED  Final   Special Requests NONE  Final   Culture   Final    NO GROWTH Performed at [facility], [place] [address], [place], [place]  [address]    Report Status 07/28/2018 FINAL  Final       Radiology Studies: Ct Abdomen Pelvis W Contrast  Result Date: 07/29/2018 CLINICAL DATA:  Abdominal pain/distension EXAM: CT ABDOMEN AND PELVIS WITH CONTRAST TECHNIQUE: Multidetector CT imaging of the abdomen and pelvis was performed using the standard protocol following bolus administration of intravenous contrast. CONTRAST:  158mL OMNIPAQUE IOHEXOL 300 MG/ML  SOLN COMPARISON:  07/26/2018 FINDINGS: Lower chest: Lung bases are clear. Hepatobiliary: Liver is within normal limits. Status post cholecystectomy. No intrahepatic ductal dilatation. Dilated common duct, measuring 13 mm, although smoothly tapering at the ampulla. Pancreas: Within normal limits. Spleen: Within normal limits. Adrenals/Urinary Tract: 17 mm right adrenal nodule. Left adrenal glands are within normal limits. Bilateral renal cysts, measuring up to 16 mm in the medial right lower kidney. No hydronephrosis. Bladder is within normal limits. Stomach/Bowel: Stomach is  within normal limits. No evidence of small bowel obstruction. Prior appendectomy. Multiple dilated loops of nondependent, gas-filled colon, suggesting adynamic colonic ileus. Near complete resolution of prior pneumatosis involving the cecum/ascending colon, with only minimal residual pneumatosis (series 3/image 66). No associated wall thickening or inflammatory changes. Vascular/Lymphatic: No evidence of abdominal aortic aneurysm. Mild atherosclerotic calcifications the abdominal aorta and branch vessels. Celiac artery, SMA, and IMA remain patent. No suspicious abdominopelvic lymphadenopathy. Reproductive: Status post hysterectomy. No adnexal masses. Other: No abdominopelvic ascites. No free air. Small fat containing periumbilical hernia. Musculoskeletal: Degenerative changes of the visualized thoracolumbar spine. IMPRESSION: Near complete resolution of prior pneumatosis involving the cecum/ascending colon. No associated wall thickening or inflammatory changes. No free air. No evidence of bowel obstruction.  Prior appendectomy. Suspected adynamic colonic ileus. Additional stable ancillary findings as above. Electronically Signed   By: Julian Hy M.D.   On: 07/29/2018 21:28   Dg Abd Portable 1v  Result Date: 07/30/2018 CLINICAL DATA:  Abdominal distension. EXAM: PORTABLE ABDOMEN - 1 VIEW COMPARISON:  Radiograph and CT scan July 29, 2018. FINDINGS: No small bowel dilatation is noted. Colonic dilatation is again noted, although the cecum appears to be decreased in caliber, although is not completely included in field-of-view. Transverse colon measures 11.5 cm in diameter. No radio-opaque calculi or other significant radiographic abnormality are seen. IMPRESSION: Colonic dilatation is noted, but may be slightly improved compared to prior exam. Electronically Signed   By: Marijo Conception, M.D.   On: 07/30/2018 10:11   Dg Be (colon) Inc Scout Abd & Delayed Images Single Cm  Result Date:  07/30/2018 CLINICAL DATA:  Stricture of the sigmoid colon. EXAM: WATER SOLUBLE CONTRAST ENEMA TECHNIQUE: Water-soluble contrast was slowly instilled per rectum. FLUOROSCOPY TIME:  Fluoroscopy Time:  1 minutes 30 seconds COMPARISON:  Radiographs dated 07/20/2018 and CT scans dated 07/26/2018 and 07/29/2018 FINDINGS: The patient has gaseous distention of the colon to the level of the sigmoid. Contrast was slowly instilled under direct fluoroscopic visualization. There is a severe 3 cm long stricture in the mid sigmoid region. This has an apple-core configuration. Because of the severity of the stricture the study was terminated without filling the remainder of the distended colon. IMPRESSION: Water-soluble enema demonstrates a severe 3 cm long stricture of the mid sigmoid region. There is an apple-core configuration. This could represent tumor or scarring from the patient's previous diverticulitis in exactly the same area. Electronically Signed   By: Lorriane Shire M.D.   On: 07/30/2018 14:46      Scheduled Meds: . insulin aspart  0-9 Units Subcutaneous Q4H  . insulin glargine  35 Units Subcutaneous Daily  . polyethylene glycol  17 g Oral BID   Continuous Infusions: . sodium chloride 0 mL/hr at 07/30/18 0508  . sodium chloride 75 mL/hr at 07/31/18 0622  . piperacillin-tazobactam (ZOSYN)  IV 3.375 g (07/31/18 0318)     LOS: 4 days    Time spent: 20 minutes   Dessa Phi, DO [facility] [email or web address] 07/31/2018, 12:15 PM

## 2018-07-31 NOTE — Anesthesia Postprocedure Evaluation (Signed)
Anesthesia Post Note  Patient: Sharon [name]  Procedure(s) Performed: FLEXIBLE SIGMOIDOSCOPY (N/A )     Patient location during evaluation: PACU Anesthesia Type: MAC Level of consciousness: awake and alert Pain management: pain level controlled Vital Signs Assessment: post-procedure vital signs reviewed and stable Respiratory status: spontaneous breathing, nonlabored ventilation, respiratory function stable and patient connected to nasal cannula oxygen Cardiovascular status: stable and blood pressure returned to baseline Postop Assessment: no apparent nausea or vomiting Anesthetic complications: no    Last Vitals:  Vitals:   07/31/18 0943 07/31/18 0952  BP: (!) 151/78 (!) 187/80  Pulse: 67   Resp: 15   Temp: 36.6 C   SpO2: 99%     Last Pain:  Vitals:   07/31/18 0943  TempSrc: Oral  PainSc: 0-No pain                 Effie Berkshire

## 2018-07-31 NOTE — H&P (Signed)
Patient here for a flexible sigmoidoscopy. She has a stricture in the sigmoid with colon distention proximal. Attempt yesterday to get past stricture and decompress not successful. It was felt that with better sedation maybe it could be accomplished.  PE, alert, no acute distress. Heart RRR, Lungs clear. Abdomen distended, tympanitic. Diffuse tenderness.  IMP: sigmoid stricture  Plan: attempt repeat flex sig with decompression.

## 2018-07-31 NOTE — Progress Notes (Signed)
Pt's niece(Donna Mahrt) at bedside stating that patient a DNR patient. Niece has a POA papers, made copy and placed in the chart. Verified with the patient. Made Jeannette Corpus NP aware. Order has been placed in the epic.

## 2018-07-31 NOTE — Anesthesia Preprocedure Evaluation (Addendum)
Anesthesia Evaluation  Patient identified by MRN, date of birth, ID band Patient awake    Reviewed: Allergy & Precautions, NPO status , Patient's Chart, lab work & pertinent test results  Airway Mallampati: III  TM Distance: >3 FB Neck ROM: Full    Dental  (+) Edentulous Upper, Edentulous Lower   Pulmonary asthma ,    breath sounds clear to auscultation       Cardiovascular hypertension, Pt. on medications and Pt. on home beta blockers + Peripheral Vascular Disease and +CHF  + Valvular Problems/Murmurs AS  Rhythm:Regular Rate:Normal + Systolic murmurs    Neuro/Psych Seizures -,  Anxiety Depression CVA    GI/Hepatic Neg liver ROS, GERD  ,  Endo/Other  diabetes, Type 2, Insulin Dependent  Renal/GU      Musculoskeletal  (+) Arthritis , Osteoarthritis,    Abdominal Normal abdominal exam  (+)   Peds  Hematology   Anesthesia Other Findings   Reproductive/Obstetrics                           Lab Results  Component Value Date   WBC 9.1 07/30/2018   HGB 10.3 (L) 07/30/2018   HCT 32.9 (L) 07/30/2018   MCV 91.6 07/30/2018   PLT 323 07/30/2018   Lab Results  Component Value Date   CREATININE 0.91 07/31/2018   BUN 8 07/31/2018   NA 144 07/31/2018   K 4.0 07/31/2018   CL 116 (H) 07/31/2018   CO2 17 (L) 07/31/2018   Lab Results  Component Value Date   INR 1.10 05/10/2018   INR 1.07 04/15/2016     Anesthesia Physical Anesthesia Plan  ASA: III  Anesthesia Plan: MAC   Post-op Pain Management:    Induction: Intravenous  PONV Risk Score and Plan: Propofol infusion  Airway Management Planned: Simple Face Mask and Natural Airway  Additional Equipment: None  Intra-op Plan:   Post-operative Plan:   Informed Consent: I have reviewed the patients History and Physical, chart, labs and discussed the procedure including the risks, benefits and alternatives for the proposed anesthesia  with the patient or authorized representative who has indicated his/her understanding and acceptance.       Plan Discussed with: CRNA  Anesthesia Plan Comments:        Anesthesia Quick Evaluation

## 2018-07-31 NOTE — Op Note (Signed)
[facility] Patient Name: Sharon [name] Procedure Date : 07/31/2018 MRN: [id number] Attending MD: Wonda Horner , MD Date of Birth: Mar 16, 1946 CSN: [id number] Age: 73 Admit Type: Inpatient Procedure:                Flexible Sigmoidoscopy Indications:              Abnormal barium enema- 3cm severe sigmoid stricture. Providers:                Wonda Horner, MD, Angus Seller, Janie Billups,                            Technician, Wyatt Haste Referring MD:              Medicines:                Propofol per Anesthesia Complications:            No immediate complications. Estimated Blood Loss:     Estimated blood loss: none. Procedure:                Pre-Anesthesia Assessment:                           - Prior to the procedure, a History and Physical                            was performed, and patient medications and                            allergies were reviewed. The patient's tolerance of                            previous anesthesia was also reviewed. The risks                            and benefits of the procedure and the sedation                            options and risks were discussed with the patient.                            All questions were answered, and informed consent                            was obtained. Prior Anticoagulants: The patient has                            taken no previous anticoagulant or antiplatelet                            agents. ASA Grade Assessment: III - A patient with                            severe systemic disease. After reviewing the risks  and benefits, the patient was deemed in                            satisfactory condition to undergo the procedure.                           After obtaining informed consent, the scope was                            passed under direct vision. The [id number]-[id number]                            [id number] Olympus Ultraslim gastroscope was           introduced through the anus and advanced to the the                            sigmoid colon. The flexible sigmoidoscopy was                            accomplished without difficulty. The patient                            tolerated the procedure well. The quality of the                            bowel preparation was unprepped. Findings:      The perianal and digital rectal examinations were normal.      Diverticula were found in the sigmoid colon.      A stenosis/stricture was found in the sigmoid colon. I could not see the       lumen at this point. I could not pass the stricture. No obvious mass       seen. Impression:               - Diverticulosis in the sigmoid colon.                           - Stricture in the sigmoid colon.                           - No specimens collected. Recommendation:           - Refer to a Psychologist, sport and exercise. Procedure Code(s):        --- Professional ---                           [phone], Sigmoidoscopy, flexible; diagnostic,                            including collection of specimen(s) by brushing or                            washing, when performed (separate procedure) Diagnosis Code(s):        --- Professional ---  K56.699, Other intestinal obstruction unspecified                            as to partial versus complete obstruction                           K57.30, Diverticulosis of large intestine without                            perforation or abscess without bleeding                           R93.3, Abnormal findings on diagnostic imaging of                            other parts of digestive tract CPT copyright 2018 [employer]. All rights reserved. The codes documented in this report are preliminary and upon coder review may  be revised to meet current compliance requirements. Wonda Horner, MD 07/31/2018 9:43:20 AM This report has been signed electronically. Number of Addenda: 0

## 2018-07-31 NOTE — Addendum Note (Signed)
Addendum  created 07/31/18 1223 by Effie Berkshire, MD   Clinical Note Signed

## 2018-07-31 NOTE — Progress Notes (Signed)
Patient ID: Sharon [name], female   DOB: 1946-04-24, 73 y.o.   MRN: [id number]    Day of Surgery  Subjective: Patient known to [place] as we have been following her this admission for right sided pneumatosis and LLQ abdominal pain.  Her CT scans have been fairly unremarkable with the exception of dilated colon.  She underwent colonoscopy x 2 yesterday and today as she was found to have a sigmoid stricture yesterday.  Unable to traverse both times for decompression.  She did have a BE yesterday as well that reveals an apple-core type stricture.  Given the inability to traverse this tight stricture, we have been asked to re-see her to discuss surgical options given persistent colonic distention.  She looks the best today she has looked since I have seen her.  She states she feels ok today, but still with significant distention.  Nausea still present.  Objective: Vital signs in last 24 hours: Temp:  [97.9 F (36.6 C)-98.3 F (36.8 C)] 97.9 F (36.6 C) (01/18 0943) Pulse Rate:  [63-86] 67 (01/18 0943) Resp:  [15-19] 15 (01/18 0943) BP: (151-187)/(69-91) 187/80 (01/18 0952) SpO2:  [95 %-99 %] 99 % (01/18 0943) Weight:  [114.8 kg] 114.8 kg (01/18 0823) Last BM Date: 07/30/18  Intake/Output from previous day: 01/17 0701 - 01/18 0700 In: 2163.1 [P.O.:300; I.V.:1178.3; IV Piggyback:684.8] Out: 1 [Stool:1] Intake/Output this shift: Total I/O In: 200 [I.V.:200] Out: -   PE: Heart: regular Lungs: CTAB Abd: morbidly obese, but also quite distended and tympanitic, especially in her upper abdomen.  Few BS, still quite tender in LLQ.  Lab Results:  Recent Labs    07/29/18 0235 07/30/18 0613  WBC 12.0* 9.1  HGB 10.5* 10.3*  HCT 32.8* 32.9*  PLT 357 323   BMET Recent Labs    07/30/18 0613 07/31/18 0250  NA 143 144  K 3.2* 4.0  CL 116* 116*  CO2 18* 17*  GLUCOSE 97 111*  BUN 7* 8  CREATININE 0.90 0.91  CALCIUM 8.6* 8.9   PT/INR No results for input(s): LABPROT, INR in the last 72  hours. CMP     Component Value Date/Time   NA 144 07/31/2018 0250   K 4.0 07/31/2018 0250   CL 116 (H) 07/31/2018 0250   CO2 17 (L) 07/31/2018 0250   GLUCOSE 111 (H) 07/31/2018 0250   BUN 8 07/31/2018 0250   CREATININE 0.91 07/31/2018 0250   CALCIUM 8.9 07/31/2018 0250   PROT 5.9 (L) 07/29/2018 0235   ALBUMIN 2.4 (L) 07/29/2018 0235   AST 41 07/29/2018 0235   ALT 59 (H) 07/29/2018 0235   ALKPHOS 139 (H) 07/29/2018 0235   BILITOT 0.4 07/29/2018 0235   GFRNONAA >60 07/31/2018 0250   GFRAA >60 07/31/2018 0250   Lipase     Component Value Date/Time   LIPASE 22 07/26/2018 1700       Studies/Results: Ct Abdomen Pelvis W Contrast  Result Date: 07/29/2018 CLINICAL DATA:  Abdominal pain/distension EXAM: CT ABDOMEN AND PELVIS WITH CONTRAST TECHNIQUE: Multidetector CT imaging of the abdomen and pelvis was performed using the standard protocol following bolus administration of intravenous contrast. CONTRAST:  190mL OMNIPAQUE IOHEXOL 300 MG/ML  SOLN COMPARISON:  07/26/2018 FINDINGS: Lower chest: Lung bases are clear. Hepatobiliary: Liver is within normal limits. Status post cholecystectomy. No intrahepatic ductal dilatation. Dilated common duct, measuring 13 mm, although smoothly tapering at the ampulla. Pancreas: Within normal limits. Spleen: Within normal limits. Adrenals/Urinary Tract: 17 mm right adrenal nodule. Left adrenal glands are  within normal limits. Bilateral renal cysts, measuring up to 16 mm in the medial right lower kidney. No hydronephrosis. Bladder is within normal limits. Stomach/Bowel: Stomach is within normal limits. No evidence of small bowel obstruction. Prior appendectomy. Multiple dilated loops of nondependent, gas-filled colon, suggesting adynamic colonic ileus. Near complete resolution of prior pneumatosis involving the cecum/ascending colon, with only minimal residual pneumatosis (series 3/image 66). No associated wall thickening or inflammatory changes.  Vascular/Lymphatic: No evidence of abdominal aortic aneurysm. Mild atherosclerotic calcifications the abdominal aorta and branch vessels. Celiac artery, SMA, and IMA remain patent. No suspicious abdominopelvic lymphadenopathy. Reproductive: Status post hysterectomy. No adnexal masses. Other: No abdominopelvic ascites. No free air. Small fat containing periumbilical hernia. Musculoskeletal: Degenerative changes of the visualized thoracolumbar spine. IMPRESSION: Near complete resolution of prior pneumatosis involving the cecum/ascending colon. No associated wall thickening or inflammatory changes. No free air. No evidence of bowel obstruction.  Prior appendectomy. Suspected adynamic colonic ileus. Additional stable ancillary findings as above. Electronically Signed   By: Julian Hy M.D.   On: 07/29/2018 21:28   Dg Abd Portable 1v  Result Date: 07/30/2018 CLINICAL DATA:  Abdominal distension. EXAM: PORTABLE ABDOMEN - 1 VIEW COMPARISON:  Radiograph and CT scan July 29, 2018. FINDINGS: No small bowel dilatation is noted. Colonic dilatation is again noted, although the cecum appears to be decreased in caliber, although is not completely included in field-of-view. Transverse colon measures 11.5 cm in diameter. No radio-opaque calculi or other significant radiographic abnormality are seen. IMPRESSION: Colonic dilatation is noted, but may be slightly improved compared to prior exam. Electronically Signed   By: Marijo Conception, M.D.   On: 07/30/2018 10:11   Dg Be (colon) Inc Scout Abd & Delayed Images Single Cm  Result Date: 07/30/2018 CLINICAL DATA:  Stricture of the sigmoid colon. EXAM: WATER SOLUBLE CONTRAST ENEMA TECHNIQUE: Water-soluble contrast was slowly instilled per rectum. FLUOROSCOPY TIME:  Fluoroscopy Time:  1 minutes 30 seconds COMPARISON:  Radiographs dated 07/20/2018 and CT scans dated 07/26/2018 and 07/29/2018 FINDINGS: The patient has gaseous distention of the colon to the level of the  sigmoid. Contrast was slowly instilled under direct fluoroscopic visualization. There is a severe 3 cm long stricture in the mid sigmoid region. This has an apple-core configuration. Because of the severity of the stricture the study was terminated without filling the remainder of the distended colon. IMPRESSION: Water-soluble enema demonstrates a severe 3 cm long stricture of the mid sigmoid region. There is an apple-core configuration. This could represent tumor or scarring from the patient's previous diverticulitis in exactly the same area. Electronically Signed   By: Lorriane Shire M.D.   On: 07/30/2018 14:46    Anti-infectives: Anti-infectives (From admission, onward)   Start     Dose/Rate Route Frequency Ordered Stop   07/27/18 0400  piperacillin-tazobactam (ZOSYN) IVPB 3.375 g     3.375 g 12.5 mL/hr over 240 Minutes Intravenous Every 8 hours 07/26/18 2203     07/26/18 2130  piperacillin-tazobactam (ZOSYN) IVPB 3.375 g     3.375 g 100 mL/hr over 30 Minutes Intravenous  Once 07/26/18 2129 07/26/18 2218       Assessment/Plan H/O CVA - Oct 2019, likely secondary to a fib/flutter A fib/flutter - follow by Dr. 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 -we will review her BE and discuss with Dr. Dema Severin to determine best approach to help her with this problem.  Obviously, given her size surgery is not straight forward which does put her at higher risk.  She is aware of this.   FEN - NPO right now after colonoscopy, may have some ice chips or sips from our standpoint and surgery is unlikely to be today VTE - ok for chemical prophylaxis from our standpoint ID - [email or web address]   LOS: 4 days    Henreitta Cea  , [facility] Surgery 07/31/2018, 10:27 AM Pager: [phone]

## 2018-07-31 NOTE — Progress Notes (Signed)
   07/31/18 1406  Vitals  BP (!) 203/91  MAP (mmHg) 123  BP Method Automatic  Pulse Rate 67  Pulse Rate Source Monitor  Oxygen Therapy  SpO2 97 %  PRN hydralazine administered as ordered for elevated blood pressure,will monitor vs

## 2018-07-31 NOTE — Transfer of Care (Signed)
Immediate Anesthesia Transfer of Care Note  Patient: Sharon [name]  Procedure(s) Performed: FLEXIBLE SIGMOIDOSCOPY (N/A )  Patient Location: Endoscopy Unit  Anesthesia Type:MAC  Level of Consciousness: drowsy and patient cooperative  Airway & Oxygen Therapy: Patient Spontanous Breathing and Patient connected to nasal cannula oxygen  Post-op Assessment: Report given to RN, Post -op Vital signs reviewed and stable and Patient moving all extremities X 4  Post vital signs: Reviewed and stable  Last Vitals:  Vitals Value Taken Time  BP 151/78 07/31/2018  9:43 AM  Temp    Pulse 65 07/31/2018  9:44 AM  Resp 17 07/31/2018  9:44 AM  SpO2 99 % 07/31/2018  9:44 AM  Vitals shown include unvalidated device data.  Last Pain:  Vitals:   07/31/18 0823  TempSrc: Oral  PainSc: 0-No pain      Patients Stated Pain Goal: 2 (26/94/85 [id number])  Complications: No apparent anesthesia complications

## 2018-08-01 DIAGNOSIS — I350 Nonrheumatic aortic (valve) stenosis: Secondary | ICD-10-CM

## 2018-08-01 DIAGNOSIS — Z01818 Encounter for other preprocedural examination: Secondary | ICD-10-CM

## 2018-08-01 LAB — BASIC METABOLIC PANEL
Anion gap: 14 (ref 5–15)
BUN: 10 mg/dL (ref 8–23)
CHLORIDE: 119 mmol/L — AB (ref 98–111)
CO2: 13 mmol/L — ABNORMAL LOW (ref 22–32)
Calcium: 8.8 mg/dL — ABNORMAL LOW (ref 8.9–10.3)
Creatinine, Ser: 1.03 mg/dL — ABNORMAL HIGH (ref 0.44–1.00)
GFR calc Af Amer: >60 mL/min (ref >60)
GFR calc non Af Amer: 55 mL/min — ABNORMAL LOW (ref >60)
Glucose, Bld: 109 mg/dL — ABNORMAL HIGH (ref 70–99)
Potassium: 3.8 mmol/L (ref 3.5–5.1)
Sodium: 146 mmol/L — ABNORMAL HIGH (ref 135–145)

## 2018-08-01 LAB — GLUCOSE, CAPILLARY
Glucose-Capillary: 116 mg/dL — ABNORMAL HIGH (ref 70–99)
Glucose-Capillary: 116 mg/dL — ABNORMAL HIGH (ref 70–99)
Glucose-Capillary: 120 mg/dL — ABNORMAL HIGH (ref 70–99)
Glucose-Capillary: 124 mg/dL — ABNORMAL HIGH (ref 70–99)
Glucose-Capillary: 136 mg/dL — ABNORMAL HIGH (ref 70–99)
Glucose-Capillary: 140 mg/dL — ABNORMAL HIGH (ref 70–99)

## 2018-08-01 LAB — MAGNESIUM: Magnesium: 2.1 mg/dL (ref 1.7–2.4)

## 2018-08-01 LAB — CBC
HCT: 35.2 % — ABNORMAL LOW (ref 36.0–46.0)
Hemoglobin: 10.8 g/dL — ABNORMAL LOW (ref 12.0–15.0)
MCH: 28.6 pg (ref 26.0–34.0)
MCHC: 30.7 g/dL (ref 30.0–36.0)
MCV: 93.1 fL (ref 80.0–100.0)
PLATELETS: 339 10*3/uL (ref 150–400)
RBC: 3.78 MIL/uL — ABNORMAL LOW (ref 3.87–5.11)
RDW: 14 % (ref 11.5–15.5)
WBC: 12.3 10*3/uL — ABNORMAL HIGH (ref 4.0–10.5)
nRBC: 0 % (ref 0.0–0.2)

## 2018-08-01 NOTE — Consult Note (Signed)
Cardiology Consultation:   Patient ID: Sharon [name] MRN: [id number]; DOB: 09-06-1945  Admit date: 07/26/2018 Date of Consult: 08/01/2018  Primary Care Provider: [name], MD Primary Cardiologist:[place] Primary Electrophysiologist:  Allred    Patient Profile:   Sharon [name] is a 73 y.o. female with a hx of chest pain, palpitations who is being seen today for the evaluation of preop cardiac risk  at the request of Maylene Roes and Hassell Done    History of Present Illness:   [name]  Is a 73 yo with history of chronic diastolc CHF, chest pain (normal myovue 2016) mild AS (mean gradient 12 on echo in 2017),  DM, CVA, possible afib (s/p insertion loop recorder)  Admitted on 1/13 with abdoiminal pain and vomiting and constipation   CT of abdomen showed pneumatosis of colon   Sigmoioscopy showed severe stricture of colon    Prior to admit the pt denied CP   Breathign was oK   Past Medical History:  Diagnosis Date  . Anxiety   . Asthma   . CHF (congestive heart failure) ([place])   . Depression   . Diabetes mellitus   . GERD (gastroesophageal reflux disease)   . Heart murmur    echo- 04/2016- mild aortic stenosis  . History of kidney stones    passed  . Hypercholesteremia   . Hypertension   . Kidney stones    "last kidney numbers normal"  . Left ear hearing loss   . Osteoarthritis   . Seizures ([place]) 04/2016   due to Hyperosmolar Nonketotic hyperglycemia- is what it was thought to be cause by. No further seizure activity.  . Thrombophlebitis     Past Surgical History:  Procedure Laterality Date  . ABDOMINAL HYSTERECTOMY    . APPENDECTOMY    . BREAST BIOPSY Right    many yrs ago per pt; neg bx  . CARPAL TUNNEL RELEASE Bilateral   . CHOLECYSTECTOMY    . COLONOSCOPY    . COLONOSCOPY WITH PROPOFOL N/A 11/03/2017   Procedure: COLONOSCOPY WITH PROPOFOL;  Surgeon: Clarene Essex, MD;  Location: [facility];  Service: Endoscopy;  Laterality: N/A;  . COLONOSCOPY WITH PROPOFOL N/A  11/04/2017   Procedure: COLONOSCOPY WITH PROPOFOL;  Surgeon: Clarene Essex, MD;  Location: [facility];  Service: Endoscopy;  Laterality: N/A;  . ESOPHAGOGASTRODUODENOSCOPY (EGD) WITH PROPOFOL N/A 11/03/2017   Procedure: ESOPHAGOGASTRODUODENOSCOPY (EGD) WITH PROPOFOL;  Surgeon: Clarene Essex, MD;  Location: [facility];  Service: Endoscopy;  Laterality: N/A;  . ESOPHAGOSCOPY    . EYE SURGERY Bilateral    Cataract with lens  . INCISION AND DRAINAGE PERIRECTAL ABSCESS    . LOOP RECORDER INSERTION N/A 05/12/2018   Procedure: LOOP RECORDER INSERTION;  Surgeon: Thompson Grayer, MD;  Location: [facility];  Service: Cardiovascular;  Laterality: N/A;  . pyelogram    . TEE WITHOUT CARDIOVERSION N/A 05/12/2018   Procedure: TRANSESOPHAGEAL ECHOCARDIOGRAM (TEE);  Surgeon: Buford Dresser, MD;  Location: [facility] ENDOSCOPY;  Service: Cardiovascular;  Laterality: N/A;       Inpatient Medications: Scheduled Meds: . insulin aspart  0-9 Units Subcutaneous Q4H  . insulin glargine  35 Units Subcutaneous Daily   Continuous Infusions: . sodium chloride 0 mL/hr at 07/30/18 0508  . sodium chloride 75 mL/hr at 07/31/18 2228  . piperacillin-tazobactam (ZOSYN)  IV 12.5 mL/hr at 08/01/18 0645   PRN Meds: sodium chloride, acetaminophen **OR** acetaminophen, hydrALAZINE, morphine injection, ondansetron **OR** ondansetron (ZOFRAN) IV, promethazine  Allergies:    Allergies  Allergen Reactions  . Crestor [  Rosuvastatin] Other (See Comments)    Myalgias  . Indomethacin Other (See Comments)    dizziness  . Invanz [Ertapenem Sodium] Hives  . Lipitor [Atorvastatin Calcium] Other (See Comments)    Myalgias   . Reglan [Metoclopramide] Other (See Comments)    TREMORS    Social History:   Social History   Socioeconomic History  . Marital status: Single    Spouse name: Not on file  . Number of children: Not on file  . Years of education: Not on file  . Highest education level: Not on file  Occupational  History  . Not on file  Social Needs  . Financial resource strain: Not on file  . Food insecurity:    Worry: Not on file    Inability: Not on file  . Transportation needs:    Medical: Not on file    Non-medical: Not on file  Tobacco Use  . Smoking status: Never Smoker  . Smokeless tobacco: Never Used  Substance and Sexual Activity  . Alcohol use: Not Currently    Frequency: Never    Comment: maybe 1 drink every few months  . Drug use: No  . Sexual activity: Not on file  Lifestyle  . Physical activity:    Days per week: Not on file    Minutes per session: Not on file  . Stress: Not on file  Relationships  . Social connections:    Talks on phone: Not on file    Gets together: Not on file    Attends religious service: Not on file    Active member of club or organization: Not on file    Attends meetings of clubs or organizations: Not on file    Relationship status: Not on file  . Intimate partner violence:    Fear of current or ex partner: Not on file    Emotionally abused: Not on file    Physically abused: Not on file    Forced sexual activity: Not on file  Other Topics Concern  . Not on file  Social History Narrative  . Not on file    Family History:    Family History  Problem Relation Age of Onset  . Heart attack Mother        8 HEART ATTACKS  . Stroke Mother        3 STROKES  . Heart attack Father   . Stroke Father      ROS:  Please see the history of present illness.   All other ROS reviewed and negative.     Physical Exam/Data:   Vitals:   07/31/18 1832 07/31/18 2003 08/01/18 0416 08/01/18 0500  BP: (!) 169/75 (!) 157/89 (!) 150/88   Pulse: 73 73 76   Resp: 16 19 19    Temp: 98.4 F (36.9 C) 97.7 F (36.5 C) 97.9 F (36.6 C)   TempSrc: Oral Oral Oral   SpO2: 96% 95% 97%   Weight:    124 kg  Height:        Intake/Output Summary (Last 24 hours) at 08/01/2018 1110 Last data filed at 08/01/2018 0645 Gross per 24 hour  Intake 1053.45 ml    Output -  Net 1053.45 ml   Last 3 Weights 08/01/2018 07/31/2018 07/28/2018  Weight (lbs) 273 lb 5.9 oz 253 lb 1.4 oz 253 lb 1.4 oz  Weight (kg) 124 kg 114.8 kg 114.8 kg     Body mass index is 55.21 kg/m.  General: Morbidly obese 73 yo in  no acute distress HEENT: normal Lymph: no adenopathy Neck: no JVD Endocrine:  No thryomegaly Vascular:  Radiating murmur heard in neck  Neck is full Cardiac:  normal S1, S2; RRR; Gr II/VI systolic murmur (early peaking ) at base    Lungs:  clear to auscultation bilaterally, no wheezing, rhonchi or rales  Abd: Sl distended   Exam deferred   Ext: no edema Musculoskeletal:  No deformities, BUE and BLE strength normal and equal Skin: warm and dry  Neuro:  CNs 2-12 intact, no focal abnormalities noted Psych:  Normal affect   EKG:  The EKG was personally reviewed and demonstrates:  On 07/26/18:  SR with PACs   80 bpm    Q wave V1, V2    Laboratory Data:  Chemistry Recent Labs  Lab 07/30/18 0613 07/31/18 0250 08/01/18 0244  NA 143 144 146*  K 3.2* 4.0 3.8  CL 116* 116* 119*  CO2 18* 17* 13*  GLUCOSE 97 111* 109*  BUN 7* 8 10  CREATININE 0.90 0.91 1.03*  CALCIUM 8.6* 8.9 8.8*  GFRNONAA >60 >60 55*  GFRAA >60 >60 >60  ANIONGAP 9 11 14     Recent Labs  Lab 07/27/18 0339 07/28/18 0214 07/29/18 0235  PROT 6.6 5.8* 5.9*  ALBUMIN 2.9* 2.4* 2.4*  AST 145* 78* 41  ALT 61* 67* 59*  ALKPHOS 166* 140* 139*  BILITOT 1.8* 0.8 0.4   Hematology Recent Labs  Lab 07/29/18 0235 07/30/18 0613 08/01/18 0644  WBC 12.0* 9.1 12.3*  RBC 3.57* 3.59* 3.78*  HGB 10.5* 10.3* 10.8*  HCT 32.8* 32.9* 35.2*  MCV 91.9 91.6 93.1  MCH 29.4 28.7 28.6  MCHC 32.0 31.3 30.7  RDW 13.4 13.6 14.0  PLT 357 323 339   Cardiac EnzymesNo results for input(s): TROPONINI in the last 168 hours. No results for input(s): TROPIPOC in the last 168 hours.  BNPNo results for input(s): BNP, PROBNP in the last 168 hours.  DDimer No results for input(s): DDIMER in the last 168  hours.  Radiology/Studies:  Ct Abdomen Pelvis W Contrast  Result Date: 07/29/2018 CLINICAL DATA:  Abdominal pain/distension EXAM: CT ABDOMEN AND PELVIS WITH CONTRAST TECHNIQUE: Multidetector CT imaging of the abdomen and pelvis was performed using the standard protocol following bolus administration of intravenous contrast. CONTRAST:  185mL OMNIPAQUE IOHEXOL 300 MG/ML  SOLN COMPARISON:  07/26/2018 FINDINGS: Lower chest: Lung bases are clear. Hepatobiliary: Liver is within normal limits. Status post cholecystectomy. No intrahepatic ductal dilatation. Dilated common duct, measuring 13 mm, although smoothly tapering at the ampulla. Pancreas: Within normal limits. Spleen: Within normal limits. Adrenals/Urinary Tract: 17 mm right adrenal nodule. Left adrenal glands are within normal limits. Bilateral renal cysts, measuring up to 16 mm in the medial right lower kidney. No hydronephrosis. Bladder is within normal limits. Stomach/Bowel: Stomach is within normal limits. No evidence of small bowel obstruction. Prior appendectomy. Multiple dilated loops of nondependent, gas-filled colon, suggesting adynamic colonic ileus. Near complete resolution of prior pneumatosis involving the cecum/ascending colon, with only minimal residual pneumatosis (series 3/image 66). No associated wall thickening or inflammatory changes. Vascular/Lymphatic: No evidence of abdominal aortic aneurysm. Mild atherosclerotic calcifications the abdominal aorta and branch vessels. Celiac artery, SMA, and IMA remain patent. No suspicious abdominopelvic lymphadenopathy. Reproductive: Status post hysterectomy. No adnexal masses. Other: No abdominopelvic ascites. No free air. Small fat containing periumbilical hernia. Musculoskeletal: Degenerative changes of the visualized thoracolumbar spine. IMPRESSION: Near complete resolution of prior pneumatosis involving the cecum/ascending colon. No associated wall thickening or inflammatory  changes. No free air.  No evidence of bowel obstruction.  Prior appendectomy. Suspected adynamic colonic ileus. Additional stable ancillary findings as above. Electronically Signed   By: Julian Hy M.D.   On: 07/29/2018 21:28   Dg Abd Portable 1v  Result Date: 07/30/2018 CLINICAL DATA:  Abdominal distension. EXAM: PORTABLE ABDOMEN - 1 VIEW COMPARISON:  Radiograph and CT scan July 29, 2018. FINDINGS: No small bowel dilatation is noted. Colonic dilatation is again noted, although the cecum appears to be decreased in caliber, although is not completely included in field-of-view. Transverse colon measures 11.5 cm in diameter. No radio-opaque calculi or other significant radiographic abnormality are seen. IMPRESSION: Colonic dilatation is noted, but may be slightly improved compared to prior exam. Electronically Signed   By: Marijo Conception, M.D.   On: 07/30/2018 10:11   Dg Abd Portable 1v  Result Date: 07/29/2018 CLINICAL DATA:  Abdominal distension EXAM: PORTABLE ABDOMEN - 1 VIEW COMPARISON:  07/28/2018; CT abdomen and pelvis-07/26/2018 FINDINGS: There is persistent marked gaseous distension of the colon with the cecum measuring approximately 12.8 cm in diameter, unchanged. Nondiagnostic evaluation for pneumoperitoneum secondary to supine positioning and exclusion of the lower thorax. Previously questioned pneumatosis demonstrated on prior abdominal CT is not definitely demonstrated on the present examination. No definite portal venous gas. No definitive abnormal intra-abdominal calcifications. Degenerative change of the lower lumbar spine. IMPRESSION: Unchanged marked gaseous distention of colon with findings again worrisome for potential distal colonic obstruction. Electronically Signed   By: Sandi Mariscal M.D.   On: 07/29/2018 06:59   Dg Be (colon) Inc Scout Abd & Delayed Images Single Cm  Result Date: 07/30/2018 CLINICAL DATA:  Stricture of the sigmoid colon. EXAM: WATER SOLUBLE CONTRAST ENEMA TECHNIQUE: Water-soluble  contrast was slowly instilled per rectum. FLUOROSCOPY TIME:  Fluoroscopy Time:  1 minutes 30 seconds COMPARISON:  Radiographs dated 07/20/2018 and CT scans dated 07/26/2018 and 07/29/2018 FINDINGS: The patient has gaseous distention of the colon to the level of the sigmoid. Contrast was slowly instilled under direct fluoroscopic visualization. There is a severe 3 cm long stricture in the mid sigmoid region. This has an apple-core configuration. Because of the severity of the stricture the study was terminated without filling the remainder of the distended colon. IMPRESSION: Water-soluble enema demonstrates a severe 3 cm long stricture of the mid sigmoid region. There is an apple-core configuration. This could represent tumor or scarring from the patient's previous diverticulitis in exactly the same area. Electronically Signed   By: Lorriane Shire M.D.   On: 07/30/2018 14:46    Assessment and Plan:   Pt is a 73 yo with histroy of mild AS   She is being evaluated for GI surgery   Overall I think her risk for cardiac event in periperative period is low.  Myovue in 2015 without ischemia   Echo in 2017 with normal LVEf and mild AS    Exam without CHF   Murmur consistent with mild AS   In SR   It is OK to proceed without furhter testing     Watch fluids    Would recomm tele post op.  2.   Hx AS   Mild on exam  3   Hx CVA   Has loop recorder in place     For questions or updates, please contact [facility] Please consult [email or web address] for contact info under     Signed, Dorris Carnes, MD  08/01/2018 11:10 AM

## 2018-08-01 NOTE — Progress Notes (Signed)
PROGRESS NOTE    Sharon [name]  JJO:[id number] DOB: 11-30-1945 DOA: 07/26/2018 PCP: [name], MD     Brief Narrative:  Sharon [name] is a 73 yo female with past medical history significant for chronic diastolic heart failure, type 2 diabetes insulin-dependent, non-hemorrhagic CVA, atrial fibrillation seen on loop recorder reportedly, who presented with abdominal pain.  CT abdomen pelvis revealed pneumatosis of the colon of unclear etiology.  General surgery and GI have been consulted.  She underwent flex sigmoidoscopy on 1/17 and 1/18 which revealed severe stricture in the sigmoid colon.  General surgery was reconsulted.  New events last 24 hours / Subjective: Complaining of ongoing LLQ abdominal pain, nausea without vomiting. No SOB, CP today.   Assessment & Plan:   Principal Problem:   Pneumatosis intestinalis Active Problems:   Type 2 diabetes mellitus without complication (HCC)   Hypokalemia   Diabetes mellitus type 2, controlled (HCC)   Chronic diastolic CHF (congestive heart failure) (HCC)   AKI (acute kidney injury) ([place])   CVA (cerebral vascular accident) ([place])   Elevated LFTs   Aortic atherosclerosis (HCC)   Severe colonic stricture -CTabdomen and pelvis showed pneumatosis involving the cecum, ascending colon and hepatic flexure of the colon -AXR 1/15 with persistent colonic obstruction concerning for distal neoplasm -AXR 1/16 Marked gaseous distention of the colon -Repeat CT abdomen pelvis revealed near complete resolution of pneumatosis, no free air, no evidence of bowel obstruction, suspected adynamic colonic ileus -Underwent flex sigmoidoscopy 1/17, technically difficult procedure due to poor prep.  Planning on Gastrografin enema -Underwent flex sigmoidoscopy 1/18 which revealed stricture in the sigmoid colon -GI, general surgery following -IVF, NPO, Zosyn -Planning for OR early next week. Cardiology consulted per gen surg request for pre-op cardiac clearance    Acute kidney injury -Baseline creatinine 0.9, resolved  Insulin-dependent diabetes type 2 -Continue Lantus, sliding scale insulin  Chronic diastolic heart failure -Euvolemic currently continue to monitor   History of non-hemorrhagic CVA Paroxysmal A Fib -Continue Eliquis when able to take p.o.  -Follows with Dr. Marlou Porch as outpatient   Morbid obesity -Body mass index is 55.21 kg/m.    DVT prophylaxis: Eliquis when able, continue SCD Code Status: Full code Family Communication: At bedside Disposition Plan: Pending surgical plan, SNF once patient has clinically improved   Consultants:   GI  General surgery  Cardiology   Procedures:   Flex sigmoidoscopy 1/17  Flex sigmoidoscopy 1/18  Antimicrobials:  Anti-infectives (From admission, onward)   Start     Dose/Rate Route Frequency Ordered Stop   07/27/18 0400  piperacillin-tazobactam (ZOSYN) IVPB 3.375 g     3.375 g 12.5 mL/hr over 240 Minutes Intravenous Every 8 hours 07/26/18 2203     07/26/18 2130  piperacillin-tazobactam (ZOSYN) IVPB 3.375 g     3.375 g 100 mL/hr over 30 Minutes Intravenous  Once 07/26/18 2129 07/26/18 2218       Objective: Vitals:   07/31/18 1832 07/31/18 2003 08/01/18 0416 08/01/18 0500  BP: (!) 169/75 (!) 157/89 (!) 150/88   Pulse: 73 73 76   Resp: 16 19 19    Temp: 98.4 F (36.9 C) 97.7 F (36.5 C) 97.9 F (36.6 C)   TempSrc: Oral Oral Oral   SpO2: 96% 95% 97%   Weight:    124 kg  Height:        Intake/Output Summary (Last 24 hours) at 08/01/2018 0913 Last data filed at 08/01/2018 0645 Gross per 24 hour  Intake 1253.45 ml  Output -  Net  1253.45 ml   Filed Weights   07/28/18 0500 07/31/18 0823 08/01/18 0500  Weight: 114.8 kg 114.8 kg 124 kg    Examination: General exam: Appears calm but uncomfortable  Respiratory system: Clear to auscultation. Respiratory effort normal. Cardiovascular system: S1 & S2 heard, RRR. No JVD, murmurs, rubs, gallops or clicks. No pedal  edema. Gastrointestinal system: Abdomen is +distended, +TTP LLQ  Central nervous system: Alert and oriented. No focal neurological deficits. Extremities: Symmetric 5 x 5 power. Skin: No rashes, lesions or ulcers Psychiatry: Judgement and insight appear normal. Mood & affect appropriate.    Data Reviewed: I have personally reviewed following labs and imaging studies  CBC: Recent Labs  Lab 07/26/18 1700  07/27/18 0339 07/28/18 0214 07/29/18 0235 07/30/18 0613 08/01/18 0644  WBC 16.3*  --  14.6* 13.4* 12.0* 9.1 12.3*  NEUTROABS 13.5*  --  13.7*  --   --   --   --   HGB 11.8*   < > 12.1 10.1* 10.5* 10.3* 10.8*  HCT 38.1   < > 37.4 31.5* 32.8* 32.9* 35.2*  MCV 93.2  --  91.2 92.1 91.9 91.6 93.1  PLT 364  --  361 344 357 323 339   < > = values in this interval not displayed.   Basic Metabolic Panel: Recent Labs  Lab 07/27/18 0339 07/28/18 0214 07/29/18 0235 07/30/18 0613 07/31/18 0250 08/01/18 0244  NA 142 148* 144 143 144 146*  K 3.0* 3.3* 3.2* 3.2* 4.0 3.8  CL 107 114* 116* 116* 116* 119*  CO2 21* 23 20* 18* 17* 13*  GLUCOSE 231* 156* 174* 97 111* 109*  BUN 21 26* 15 7* 8 10  CREATININE 1.31* 1.50* 0.97 0.90 0.91 1.03*  CALCIUM 8.8* 8.6* 8.6* 8.6* 8.9 8.8*  MG 2.5*  --   --  2.0  --  2.1  PHOS  --   --   --  1.9*  --   --    GFR: Estimated Creatinine Clearance: 60.6 mL/min (A) (by C-G formula based on SCr of 1.03 mg/dL (H)). Liver Function Tests: Recent Labs  Lab 07/26/18 1700 07/27/18 0339 07/28/18 0214 07/29/18 0235  AST 8* 145* 78* 41  ALT 9 61* 67* 59*  ALKPHOS 64 166* 140* 139*  BILITOT 1.4* 1.8* 0.8 0.4  PROT 6.6 6.6 5.8* 5.9*  ALBUMIN 3.0* 2.9* 2.4* 2.4*   Recent Labs  Lab 07/26/18 1700  LIPASE 22   No results for input(s): AMMONIA in the last 168 hours. Coagulation Profile: No results for input(s): INR, PROTIME in the last 168 hours. Cardiac Enzymes: No results for input(s): CKTOTAL, CKMB, CKMBINDEX, TROPONINI in the last 168 hours. BNP  (last 3 results) No results for input(s): PROBNP in the last 8760 hours. HbA1C: No results for input(s): HGBA1C in the last 72 hours. CBG: Recent Labs  Lab 07/31/18 1630 07/31/18 2004 08/01/18 0053 08/01/18 0415 08/01/18 0744  GLUCAP 119* 109* 116* 116* 120*   Lipid Profile: No results for input(s): CHOL, HDL, LDLCALC, TRIG, CHOLHDL, LDLDIRECT in the last 72 hours. Thyroid Function Tests: No results for input(s): TSH, T4TOTAL, FREET4, T3FREE, THYROIDAB in the last 72 hours. Anemia Panel: No results for input(s): VITAMINB12, FOLATE, FERRITIN, TIBC, IRON, RETICCTPCT in the last 72 hours. Sepsis Labs: Recent Labs  Lab 07/26/18 1907 07/27/18 1322  LATICACIDVEN 1.03 1.4    Recent Results (from the past 240 hour(s))  Culture, Urine     Status: None   Collection Time: 07/27/18  4:51 PM  Result Value Ref Range Status   Specimen Description URINE, CATHETERIZED  Final   Special Requests NONE  Final   Culture   Final    NO GROWTH Performed at [facility], [address] [place], [place] [address]    Report Status 07/28/2018 FINAL  Final       Radiology Studies: Dg Abd Portable 1v  Result Date: 07/30/2018 CLINICAL DATA:  Abdominal distension. EXAM: PORTABLE ABDOMEN - 1 VIEW COMPARISON:  Radiograph and CT scan July 29, 2018. FINDINGS: No small bowel dilatation is noted. Colonic dilatation is again noted, although the cecum appears to be decreased in caliber, although is not completely included in field-of-view. Transverse colon measures 11.5 cm in diameter. No radio-opaque calculi or other significant radiographic abnormality are seen. IMPRESSION: Colonic dilatation is noted, but may be slightly improved compared to prior exam. Electronically Signed   By: Marijo Conception, M.D.   On: 07/30/2018 10:11   Dg Be (colon) Inc Scout Abd & Delayed Images Single Cm  Result Date: 07/30/2018 CLINICAL DATA:  Stricture of the sigmoid colon. EXAM: WATER SOLUBLE CONTRAST ENEMA  TECHNIQUE: Water-soluble contrast was slowly instilled per rectum. FLUOROSCOPY TIME:  Fluoroscopy Time:  1 minutes 30 seconds COMPARISON:  Radiographs dated 07/20/2018 and CT scans dated 07/26/2018 and 07/29/2018 FINDINGS: The patient has gaseous distention of the colon to the level of the sigmoid. Contrast was slowly instilled under direct fluoroscopic visualization. There is a severe 3 cm long stricture in the mid sigmoid region. This has an apple-core configuration. Because of the severity of the stricture the study was terminated without filling the remainder of the distended colon. IMPRESSION: Water-soluble enema demonstrates a severe 3 cm long stricture of the mid sigmoid region. There is an apple-core configuration. This could represent tumor or scarring from the patient's previous diverticulitis in exactly the same area. Electronically Signed   By: Lorriane Shire M.D.   On: 07/30/2018 14:46      Scheduled Meds: . insulin aspart  0-9 Units Subcutaneous Q4H  . insulin glargine  35 Units Subcutaneous Daily   Continuous Infusions: . sodium chloride 0 mL/hr at 07/30/18 0508  . sodium chloride 75 mL/hr at 07/31/18 2228  . piperacillin-tazobactam (ZOSYN)  IV 12.5 mL/hr at 08/01/18 0645     LOS: 5 days    Time spent: 25 minutes   Dessa Phi, DO [facility] [email or web address] 08/01/2018, 9:13 AM

## 2018-08-01 NOTE — Progress Notes (Signed)
Patient ID: Sharon [name], female   DOB: January 08, 1946, 73 y.o.   MRN: [id number] [facility] Progress Note:   1 Day Post-Op  Subjective: Mental status is arouseable and alert Objective: Vital signs in last 24 hours: Temp:  [97.6 F (36.4 C)-98.4 F (36.9 C)] 97.9 F (36.6 C) (01/19 0416) Pulse Rate:  [67-76] 76 (01/19 0416) Resp:  [16-19] 19 (01/19 0416) BP: (150-203)/(75-91) 150/88 (01/19 0416) SpO2:  [95 %-97 %] 97 % (01/19 0416) Weight:  [762 kg] 124 kg (01/19 0500)  Intake/Output from previous day: 01/18 0701 - 01/19 0700 In: 1253.5 [I.V.:995.1; IV Piggyback:258.3] Out: -  Intake/Output this shift: No intake/output data recorded.  Physical Exam: Work of breathing is not labored.  Distended but comfortable.  + flatus  Lab Results:  Results for orders placed or performed during the hospital encounter of 07/26/18 (from the past 48 hour(s))  Glucose, capillary     Status: Abnormal   Collection Time: 07/30/18 12:03 PM  Result Value Ref Range   Glucose-Capillary 107 (H) 70 - 99 mg/dL  Glucose, capillary     Status: Abnormal   Collection Time: 07/30/18  5:12 PM  Result Value Ref Range   Glucose-Capillary 104 (H) 70 - 99 mg/dL  Glucose, capillary     Status: None   Collection Time: 07/30/18  8:08 PM  Result Value Ref Range   Glucose-Capillary 99 70 - 99 mg/dL  Glucose, capillary     Status: Abnormal   Collection Time: 07/30/18 11:51 PM  Result Value Ref Range   Glucose-Capillary 113 (H) 70 - 99 mg/dL  Basic metabolic panel     Status: Abnormal   Collection Time: 07/31/18  2:50 AM  Result Value Ref Range   Sodium 144 135 - 145 mmol/L   Potassium 4.0 3.5 - 5.1 mmol/L   Chloride 116 (H) 98 - 111 mmol/L   CO2 17 (L) 22 - 32 mmol/L   Glucose, Bld 111 (H) 70 - 99 mg/dL   BUN 8 8 - 23 mg/dL   Creatinine, Ser 0.91 0.44 - 1.00 mg/dL   Calcium 8.9 8.9 - 10.3 mg/dL   GFR calc non Af Amer >60 >60 mL/min   GFR calc Af Amer >60 >60 mL/min   Anion gap 11 5 - 15   Comment: Performed at [facility], [place] [address], [place], [place] [address]  Glucose, capillary     Status: Abnormal   Collection Time: 07/31/18  3:56 AM  Result Value Ref Range   Glucose-Capillary 114 (H) 70 - 99 mg/dL  Glucose, capillary     Status: Abnormal   Collection Time: 07/31/18  8:02 AM  Result Value Ref Range   Glucose-Capillary 109 (H) 70 - 99 mg/dL  Glucose, capillary     Status: None   Collection Time: 07/31/18 11:57 AM  Result Value Ref Range   Glucose-Capillary 99 70 - 99 mg/dL  Glucose, capillary     Status: Abnormal   Collection Time: 07/31/18  4:30 PM  Result Value Ref Range   Glucose-Capillary 119 (H) 70 - 99 mg/dL  Glucose, capillary     Status: Abnormal   Collection Time: 07/31/18  8:04 PM  Result Value Ref Range   Glucose-Capillary 109 (H) 70 - 99 mg/dL  Glucose, capillary     Status: Abnormal   Collection Time: 08/01/18 12:53 AM  Result Value Ref Range   Glucose-Capillary 116 (H) 70 - 99 mg/dL  Basic metabolic panel     Status:  Abnormal   Collection Time: 08/01/18  2:44 AM  Result Value Ref Range   Sodium 146 (H) 135 - 145 mmol/L   Potassium 3.8 3.5 - 5.1 mmol/L    Comment: SLIGHT HEMOLYSIS   Chloride 119 (H) 98 - 111 mmol/L   CO2 13 (L) 22 - 32 mmol/L   Glucose, Bld 109 (H) 70 - 99 mg/dL   BUN 10 8 - 23 mg/dL   Creatinine, Ser 1.03 (H) 0.44 - 1.00 mg/dL   Calcium 8.8 (L) 8.9 - 10.3 mg/dL   GFR calc non Af Amer 55 (L) >60 mL/min   GFR calc Af Amer >60 >60 mL/min   Anion gap 14 5 - 15    Comment: Performed at [facility], [place] [address], [place], [place] [address]  Magnesium     Status: None   Collection Time: 08/01/18  2:44 AM  Result Value Ref Range   Magnesium 2.1 1.7 - 2.4 mg/dL    Comment: Performed at [facility] [address], [place], [place] [address]  Glucose, capillary     Status: Abnormal   Collection Time: 08/01/18  4:15 AM  Result Value Ref Range   Glucose-Capillary 116 (H) 70 - 99 mg/dL  CBC     Status:  Abnormal   Collection Time: 08/01/18  6:44 AM  Result Value Ref Range   WBC 12.3 (H) 4.0 - 10.5 K/uL   RBC 3.78 (L) 3.87 - 5.11 MIL/uL   Hemoglobin 10.8 (L) 12.0 - 15.0 g/dL   HCT 35.2 (L) 36.0 - 46.0 %   MCV 93.1 80.0 - 100.0 fL   MCH 28.6 26.0 - 34.0 pg   MCHC 30.7 30.0 - 36.0 g/dL   RDW 14.0 11.5 - 15.5 %   Platelets 339 150 - 400 K/uL   nRBC 0.0 0.0 - 0.2 %    Comment: Performed at [facility], [place] [address], [place], [place] [address]  Glucose, capillary     Status: Abnormal   Collection Time: 08/01/18  7:44 AM  Result Value Ref Range   Glucose-Capillary 120 (H) 70 - 99 mg/dL    Radiology/Results: Dg Be (colon) Inc Scout Abd & Delayed Images Single Cm  Result Date: 07/30/2018 CLINICAL DATA:  Stricture of the sigmoid colon. EXAM: WATER SOLUBLE CONTRAST ENEMA TECHNIQUE: Water-soluble contrast was slowly instilled per rectum. FLUOROSCOPY TIME:  Fluoroscopy Time:  1 minutes 30 seconds COMPARISON:  Radiographs dated 07/20/2018 and CT scans dated 07/26/2018 and 07/29/2018 FINDINGS: The patient has gaseous distention of the colon to the level of the sigmoid. Contrast was slowly instilled under direct fluoroscopic visualization. There is a severe 3 cm long stricture in the mid sigmoid region. This has an apple-core configuration. Because of the severity of the stricture the study was terminated without filling the remainder of the distended colon. IMPRESSION: Water-soluble enema demonstrates a severe 3 cm long stricture of the mid sigmoid region. There is an apple-core configuration. This could represent tumor or scarring from the patient's previous diverticulitis in exactly the same area. Electronically Signed   By: Lorriane Shire M.D.   On: 07/30/2018 14:46    Anti-infectives: Anti-infectives (From admission, onward)   Start     Dose/Rate Route Frequency Ordered Stop   07/27/18 0400  piperacillin-tazobactam (ZOSYN) IVPB 3.375 g     3.375 g 12.5 mL/hr over 240 Minutes Intravenous  Every 8 hours 07/26/18 2203     07/26/18 2130  piperacillin-tazobactam (ZOSYN) IVPB 3.375 g  3.375 g 100 mL/hr over 30 Minutes Intravenous  Once 07/26/18 2129 07/26/18 2218      Assessment/Plan: Problem List: Patient Active Problem List   Diagnosis Date Noted  . Elevated LFTs 07/27/2018  . Aortic atherosclerosis ([place]) 07/27/2018  . 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 Cerebral thrombosis with cerebral infarction 05/11/2018  . CVA (cerebral vascular accident) ([place]) 05/11/2018  . Stroke-like symptoms 05/10/2018  . Hyperosmolar (nonketotic) coma ([place]) 04/15/2016  . Seizure ([place]) 04/15/2016  . AKI (acute kidney injury) ([place]) 04/15/2016  . Chronic diastolic heart failure ([place]) 04/15/2016  . Chronic diastolic CHF (congestive heart failure) ([place]) 06/04/2015  . Diverticulitis 05/28/2015  . Sigmoid diverticulitis 05/28/2015  . Hypokalemia 05/28/2015  . Diabetes mellitus type 2, controlled ([place]) 05/28/2015  . Chronic anemia 05/28/2015  . Diverticulitis of large intestine without perforation or abscess without bleeding   . Palpitation 04/12/2015  . Morbid obesity ([place]) 04/12/2015  . Mild aortic stenosis 04/12/2015  . Type 2 diabetes mellitus without complication ([place]) 24/03/7352  . Diabetes mellitus ([place]) 06/27/2014  . Hypertension 06/27/2014  . Chest pain 06/27/2014  . Asthma 06/27/2014    Dr. Dema Severin is DOW this week and will coordinate surgical approach.   1 Day Post-Op    LOS: 5 days   Matt B. Hassell Done, MD, [facility] [phone] beeper [phone]  08/01/2018 10:28 AM

## 2018-08-02 ENCOUNTER — Inpatient Hospital Stay (HOSPITAL_COMMUNITY): Payer: Medicare Other | Admitting: Anesthesiology

## 2018-08-02 ENCOUNTER — Encounter (HOSPITAL_COMMUNITY): Payer: Self-pay

## 2018-08-02 ENCOUNTER — Encounter (HOSPITAL_COMMUNITY)
Admission: EM | Disposition: A | Discharge status: Home / Self Care | Payer: Self-pay | Source: Home / Self Care | Attending: Internal Medicine

## 2018-08-02 HISTORY — PX: LAPAROTOMY: SHX154

## 2018-08-02 HISTORY — PX: LYSIS OF ADHESION: SHX5961

## 2018-08-02 HISTORY — PX: PARTIAL COLECTOMY: SHX5273

## 2018-08-02 HISTORY — PX: COLOSTOMY: SHX63

## 2018-08-02 LAB — BASIC METABOLIC PANEL
Anion gap: 13 (ref 5–15)
BUN: 14 mg/dL (ref 8–23)
CO2: 15 mmol/L — AB (ref 22–32)
Calcium: 9.2 mg/dL (ref 8.9–10.3)
Chloride: 119 mmol/L — ABNORMAL HIGH (ref 98–111)
Creatinine, Ser: 1.14 mg/dL — ABNORMAL HIGH (ref 0.44–1.00)
GFR calc Af Amer: 56 mL/min — ABNORMAL LOW (ref >60)
GFR calc non Af Amer: 49 mL/min — ABNORMAL LOW (ref >60)
Glucose, Bld: 135 mg/dL — ABNORMAL HIGH (ref 70–99)
Potassium: 3.3 mmol/L — ABNORMAL LOW (ref 3.5–5.1)
Sodium: 147 mmol/L — ABNORMAL HIGH (ref 135–145)

## 2018-08-02 LAB — TYPE AND SCREEN
ABO/RH(D): A POS
Antibody Screen: NEGATIVE

## 2018-08-02 LAB — GLUCOSE, CAPILLARY
GLUCOSE-CAPILLARY: 137 mg/dL — AB (ref 70–99)
Glucose-Capillary: 125 mg/dL — ABNORMAL HIGH (ref 70–99)
Glucose-Capillary: 138 mg/dL — ABNORMAL HIGH (ref 70–99)
Glucose-Capillary: 152 mg/dL — ABNORMAL HIGH (ref 70–99)
Glucose-Capillary: 156 mg/dL — ABNORMAL HIGH (ref 70–99)
Glucose-Capillary: 183 mg/dL — ABNORMAL HIGH (ref 70–99)
Glucose-Capillary: 211 mg/dL — ABNORMAL HIGH (ref 70–99)

## 2018-08-02 LAB — CBC
HEMATOCRIT: 36.2 % (ref 36.0–46.0)
Hemoglobin: 11.3 g/dL — ABNORMAL LOW (ref 12.0–15.0)
MCH: 29.2 pg (ref 26.0–34.0)
MCHC: 31.2 g/dL (ref 30.0–36.0)
MCV: 93.5 fL (ref 80.0–100.0)
Platelets: 384 10*3/uL (ref 150–400)
RBC: 3.87 MIL/uL (ref 3.87–5.11)
RDW: 14.3 % (ref 11.5–15.5)
WBC: 14.4 10*3/uL — ABNORMAL HIGH (ref 4.0–10.5)
nRBC: 0 % (ref 0.0–0.2)

## 2018-08-02 LAB — MRSA PCR SCREENING: MRSA by PCR: NEGATIVE

## 2018-08-02 LAB — ABO/RH: ABO/RH(D): A POS

## 2018-08-02 SURGERY — LAPAROTOMY, EXPLORATORY
Anesthesia: General | Site: Abdomen

## 2018-08-02 MED ORDER — MIDAZOLAM HCL 2 MG/2ML IJ SOLN
1.0000 mg | Freq: Once | INTRAMUSCULAR | Status: AC
  Administered 2018-08-02: 1 mg via INTRAVENOUS

## 2018-08-02 MED ORDER — ALBUMIN HUMAN 5 % IV SOLN
INTRAVENOUS | Status: DC | PRN
  Administered 2018-08-02: 13:00:00 via INTRAVENOUS

## 2018-08-02 MED ORDER — ACETAMINOPHEN 500 MG PO TABS
1000.0000 mg | ORAL_TABLET | Freq: Four times a day (QID) | ORAL | Status: DC
  Administered 2018-08-03 – 2018-08-04 (×4): 1000 mg via ORAL
  Filled 2018-08-02 (×7): qty 2

## 2018-08-02 MED ORDER — HYDROMORPHONE HCL 1 MG/ML IJ SOLN
0.2500 mg | INTRAMUSCULAR | Status: DC | PRN
  Administered 2018-08-02 (×2): 0.5 mg via INTRAVENOUS

## 2018-08-02 MED ORDER — MORPHINE SULFATE (PF) 2 MG/ML IV SOLN
1.0000 mg | INTRAVENOUS | Status: DC | PRN
  Administered 2018-08-03 – 2018-08-04 (×3): 2 mg via INTRAVENOUS
  Filled 2018-08-02 (×3): qty 1

## 2018-08-02 MED ORDER — 0.9 % SODIUM CHLORIDE (POUR BTL) OPTIME
TOPICAL | Status: DC | PRN
  Administered 2018-08-02: 2000 mL

## 2018-08-02 MED ORDER — MIDAZOLAM HCL 2 MG/2ML IJ SOLN
INTRAMUSCULAR | Status: AC
  Administered 2018-08-02: 1 mg via INTRAVENOUS
  Filled 2018-08-02: qty 2

## 2018-08-02 MED ORDER — FENTANYL CITRATE (PF) 100 MCG/2ML IJ SOLN
100.0000 ug | Freq: Once | INTRAMUSCULAR | Status: AC
  Administered 2018-08-02: 100 ug via INTRAVENOUS

## 2018-08-02 MED ORDER — FENTANYL CITRATE (PF) 100 MCG/2ML IJ SOLN: 50.0000 ug | Freq: Once | INTRAMUSCULAR | Status: DC

## 2018-08-02 MED ORDER — FENTANYL CITRATE (PF) 100 MCG/2ML IJ SOLN
INTRAMUSCULAR | Status: AC
  Administered 2018-08-02: 100 ug via INTRAVENOUS
  Filled 2018-08-02: qty 2

## 2018-08-02 MED ORDER — ROCURONIUM BROMIDE 10 MG/ML (PF) SYRINGE
PREFILLED_SYRINGE | INTRAVENOUS | Status: DC | PRN
  Administered 2018-08-02: 20 mg via INTRAVENOUS
  Administered 2018-08-02: 30 mg via INTRAVENOUS
  Administered 2018-08-02: 50 mg via INTRAVENOUS
  Administered 2018-08-02: 20 mg via INTRAVENOUS

## 2018-08-02 MED ORDER — LACTATED RINGERS IV SOLN
INTRAVENOUS | Status: DC | PRN
  Administered 2018-08-02: 09:00:00 via INTRAVENOUS

## 2018-08-02 MED ORDER — DEXTROSE-NACL 5-0.45 % IV SOLN
INTRAVENOUS | Status: DC
  Administered 2018-08-02 – 2018-08-03 (×3): via INTRAVENOUS

## 2018-08-02 MED ORDER — PROPOFOL 10 MG/ML IV BOLUS
INTRAVENOUS | Status: DC | PRN
  Administered 2018-08-02: 100 mg via INTRAVENOUS

## 2018-08-02 MED ORDER — HYDROMORPHONE HCL 1 MG/ML IJ SOLN
INTRAMUSCULAR | Status: AC
  Filled 2018-08-02: qty 1

## 2018-08-02 MED ORDER — ENOXAPARIN SODIUM 40 MG/0.4ML ~~LOC~~ SOLN
40.0000 mg | SUBCUTANEOUS | Status: DC
  Administered 2018-08-03: 40 mg via SUBCUTANEOUS
  Filled 2018-08-02: qty 0.4

## 2018-08-02 MED ORDER — LIDOCAINE 2% (20 MG/ML) 5 ML SYRINGE
INTRAMUSCULAR | Status: DC | PRN
  Administered 2018-08-02: 60 mg via INTRAVENOUS

## 2018-08-02 MED ORDER — LACTATED RINGERS IV SOLN
INTRAVENOUS | Status: DC | PRN
  Administered 2018-08-02 (×2): via INTRAVENOUS

## 2018-08-02 MED ORDER — SUCCINYLCHOLINE CHLORIDE 20 MG/ML IJ SOLN
INTRAMUSCULAR | Status: DC | PRN
  Administered 2018-08-02: 100 mg via INTRAVENOUS

## 2018-08-02 MED ORDER — PROPOFOL 10 MG/ML IV BOLUS
INTRAVENOUS | Status: AC
  Filled 2018-08-02: qty 20

## 2018-08-02 MED ORDER — ESMOLOL HCL 100 MG/10ML IV SOLN
INTRAVENOUS | Status: DC | PRN
  Administered 2018-08-02 (×2): 40 mg via INTRAVENOUS

## 2018-08-02 MED ORDER — FENTANYL CITRATE (PF) 250 MCG/5ML IJ SOLN
INTRAMUSCULAR | Status: AC
  Filled 2018-08-02: qty 5

## 2018-08-02 MED ORDER — DEXAMETHASONE SODIUM PHOSPHATE 10 MG/ML IJ SOLN
INTRAMUSCULAR | Status: DC | PRN
  Administered 2018-08-02: 5 mg via INTRAVENOUS

## 2018-08-02 MED ORDER — SODIUM CHLORIDE 0.9 % IV SOLN
INTRAVENOUS | Status: DC | PRN
  Administered 2018-08-02: 20 ug/min via INTRAVENOUS

## 2018-08-02 MED ORDER — METOPROLOL TARTRATE 5 MG/5ML IV SOLN
5.0000 mg | INTRAVENOUS | Status: DC | PRN
  Administered 2018-08-08 – 2018-08-09 (×2): 5 mg via INTRAVENOUS
  Filled 2018-08-02 (×2): qty 5

## 2018-08-02 MED ORDER — PIPERACILLIN-TAZOBACTAM 3.375 G IVPB 30 MIN
3.3750 g | INTRAVENOUS | Status: AC
  Administered 2018-08-02: 3.375 g via INTRAVENOUS
  Filled 2018-08-02: qty 50

## 2018-08-02 MED ORDER — POTASSIUM CHLORIDE 10 MEQ/100ML IV SOLN
10.0000 meq | INTRAVENOUS | Status: AC
  Administered 2018-08-02: 10 meq via INTRAVENOUS
  Filled 2018-08-02: qty 100

## 2018-08-02 MED ORDER — SUGAMMADEX SODIUM 200 MG/2ML IV SOLN
INTRAVENOUS | Status: DC | PRN
  Administered 2018-08-02: 400 mg via INTRAVENOUS

## 2018-08-02 MED ORDER — METHOCARBAMOL 1000 MG/10ML IJ SOLN
500.0000 mg | Freq: Three times a day (TID) | INTRAVENOUS | Status: DC
  Administered 2018-08-02 – 2018-08-10 (×21): 500 mg via INTRAVENOUS
  Filled 2018-08-02: qty 500
  Filled 2018-08-02 (×9): qty 5
  Filled 2018-08-02: qty 500
  Filled 2018-08-02 (×17): qty 5
  Filled 2018-08-02: qty 500

## 2018-08-02 MED ORDER — FENTANYL CITRATE (PF) 250 MCG/5ML IJ SOLN
INTRAMUSCULAR | Status: DC | PRN
  Administered 2018-08-02: 150 ug via INTRAVENOUS
  Administered 2018-08-02: 100 ug via INTRAVENOUS

## 2018-08-02 SURGICAL SUPPLY — 53 items
BLADE CLIPPER SURG (BLADE) IMPLANT
CANISTER SUCT 3000ML PPV (MISCELLANEOUS) ×4 IMPLANT
CHLORAPREP W/TINT 26ML (MISCELLANEOUS) ×4 IMPLANT
COVER SURGICAL LIGHT HANDLE (MISCELLANEOUS) ×4 IMPLANT
COVER WAND RF STERILE (DRAPES) ×2 IMPLANT
DRAIN CHANNEL 19F RND (DRAIN) ×2 IMPLANT
DRAPE LAPAROSCOPIC ABDOMINAL (DRAPES) ×4 IMPLANT
DRAPE WARM FLUID 44X44 (DRAPE) ×4 IMPLANT
DRSG OPSITE POSTOP 4X12 (GAUZE/BANDAGES/DRESSINGS) ×2 IMPLANT
ELECT BLADE 6.5 EXT (BLADE) ×4 IMPLANT
ELECT CAUTERY BLADE 6.4 (BLADE) ×4 IMPLANT
ELECT REM PT RETURN 9FT ADLT (ELECTROSURGICAL) ×4
ELECTRODE REM PT RTRN 9FT ADLT (ELECTROSURGICAL) ×2 IMPLANT
EVACUATOR SILICONE 100CC (DRAIN) ×2 IMPLANT
GLOVE BIO SURGEON STRL SZ7.5 (GLOVE) ×6 IMPLANT
GLOVE BIO SURGEON STRL SZ8 (GLOVE) ×2 IMPLANT
GLOVE BIOGEL M 7.0 STRL (GLOVE) ×8 IMPLANT
GLOVE BIOGEL PI IND STRL 7.0 (GLOVE) IMPLANT
GLOVE BIOGEL PI INDICATOR 7.0 (GLOVE) ×4
GLOVE ECLIPSE 7.0 STRL STRAW (GLOVE) ×2 IMPLANT
GLOVE INDICATOR 8.0 STRL GRN (GLOVE) ×6 IMPLANT
GLOVE SURG SS PI 6.5 STRL IVOR (GLOVE) ×2 IMPLANT
GLOVE SURG SS PI 7.0 STRL IVOR (GLOVE) ×4 IMPLANT
GOWN STRL REUS W/ TWL LRG LVL3 (GOWN DISPOSABLE) ×2 IMPLANT
GOWN STRL REUS W/ TWL XL LVL3 (GOWN DISPOSABLE) ×2 IMPLANT
GOWN STRL REUS W/TWL LRG LVL3 (GOWN DISPOSABLE) ×10
GOWN STRL REUS W/TWL XL LVL3 (GOWN DISPOSABLE) ×4
KIT BASIN OR (CUSTOM PROCEDURE TRAY) ×4 IMPLANT
KIT OSTOMY DRAINABLE 2.75 STR (WOUND CARE) ×2 IMPLANT
KIT TURNOVER KIT B (KITS) ×4 IMPLANT
LIGASURE IMPACT 36 18CM CVD LR (INSTRUMENTS) ×2 IMPLANT
NS IRRIG 1000ML POUR BTL (IV SOLUTION) ×12 IMPLANT
PACK GENERAL/GYN (CUSTOM PROCEDURE TRAY) ×4 IMPLANT
PAD ARMBOARD 7.5X6 YLW CONV (MISCELLANEOUS) ×4 IMPLANT
PENCIL SMOKE EVACUATOR (MISCELLANEOUS) ×2 IMPLANT
SLEEVE SUCTION CATH 165 (SLEEVE) ×2 IMPLANT
SPECIMEN JAR LARGE (MISCELLANEOUS) IMPLANT
SPONGE DRAIN TRACH 4X4 STRL 2S (GAUZE/BANDAGES/DRESSINGS) ×2 IMPLANT
SPONGE LAP 18X18 X RAY DECT (DISPOSABLE) ×2 IMPLANT
STAPLER CUT CVD 40MM GREEN (STAPLE) ×2 IMPLANT
STAPLER VISISTAT 35W (STAPLE) ×4 IMPLANT
SUCTION POOLE TIP (SUCTIONS) ×6 IMPLANT
SUT ETHILON 2 0 FS 18 (SUTURE) ×2 IMPLANT
SUT PDS AB 1 TP1 96 (SUTURE) ×8 IMPLANT
SUT SILK 0 FSL (SUTURE) ×2 IMPLANT
SUT SILK 2 0 SH CR/8 (SUTURE) ×4 IMPLANT
SUT SILK 2 0 TIES 10X30 (SUTURE) ×4 IMPLANT
SUT SILK 3 0 SH CR/8 (SUTURE) ×4 IMPLANT
SUT SILK 3 0 TIES 10X30 (SUTURE) ×4 IMPLANT
SUT VIC AB 3-0 SH 18 (SUTURE) ×4 IMPLANT
TOWEL OR 17X26 10 PK STRL BLUE (TOWEL DISPOSABLE) ×4 IMPLANT
TRAY FOLEY MTR SLVR 16FR STAT (SET/KITS/TRAYS/PACK) ×4 IMPLANT
YANKAUER SUCT BULB TIP NO VENT (SUCTIONS) IMPLANT

## 2018-08-02 NOTE — Progress Notes (Addendum)
[facility] Progress Note  2 Days Post-Op  Subjective: CC-  Patient states that she is still miserable. She is nauseated, bloated, and has abdominal pain. Passing some flatus, loose BM yesterday.  Nervous about surgery today. States that all her questions about surgery have been answered.  Objective: Vital signs in last 24 hours: Temp:  [97.9 F (36.6 C)-98.3 F (36.8 C)] 98.3 F (36.8 C) (01/20 0412) Pulse Rate:  [72-82] 72 (01/20 0500) Resp:  [17-19] 19 (01/20 0412) BP: (156-192)/(70-93) 156/70 (01/20 0500) SpO2:  [96 %-100 %] 100 % (01/20 0412) Weight:  [124.8 kg] 124.8 kg (01/20 0459) Last BM Date: 08/01/18  Intake/Output from previous day: 01/19 0701 - 01/20 0700 In: 1795.7 [I.V.:1613.2; IV Piggyback:182.5] Out: 6144 [Urine:1180] Intake/Output this shift: No intake/output data recorded.  PE: Gen:  Alert, NAD HEENT: EOM's intact, pupils equal and round Card:  RRR Pulm:  Few rhonchi, no wheezing, effort normal Abd: obese, soft, distended, few BS heard, no HSM, no hernia, tender LLQ  Skin: warm and dry  Lab Results:  Recent Labs    08/01/18 0644 08/02/18 0230  WBC 12.3* 14.4*  HGB 10.8* 11.3*  HCT 35.2* 36.2  PLT 339 384   BMET Recent Labs    08/01/18 0244 08/02/18 0230  NA 146* 147*  K 3.8 3.3*  CL 119* 119*  CO2 13* 15*  GLUCOSE 109* 135*  BUN 10 14  CREATININE 1.03* 1.14*  CALCIUM 8.8* 9.2   PT/INR No results for input(s): LABPROT, INR in the last 72 hours. CMP     Component Value Date/Time   NA 147 (H) 08/02/2018 0230   K 3.3 (L) 08/02/2018 0230   CL 119 (H) 08/02/2018 0230   CO2 15 (L) 08/02/2018 0230   GLUCOSE 135 (H) 08/02/2018 0230   BUN 14 08/02/2018 0230   CREATININE 1.14 (H) 08/02/2018 0230   CALCIUM 9.2 08/02/2018 0230   PROT 5.9 (L) 07/29/2018 0235   ALBUMIN 2.4 (L) 07/29/2018 0235   AST 41 07/29/2018 0235   ALT 59 (H) 07/29/2018 0235   ALKPHOS 139 (H) 07/29/2018 0235   BILITOT 0.4 07/29/2018 0235   GFRNONAA  49 (L) 08/02/2018 0230   GFRAA 56 (L) 08/02/2018 0230   Lipase     Component Value Date/Time   LIPASE 22 07/26/2018 1700       Studies/Results: No results found.  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 Will ask WOC to mark for ostomy. Keep NPO and continue IV zosyn.  Appreciate cardiology evaluation, recommending tele postop.  ID - zosyn 1/13>> FEN - IVF, NPO VTE - SCDs Foley - wick   LOS: 6 days    Wellington Hampshire , [facility] 08/02/2018, 8:27 AM Pager: [phone] Mon-Thurs 7:00 am-4:30 pm Fri  7:00 am -11:30 AM Sat-Sun 7:00 am-11:30 am

## 2018-08-02 NOTE — Progress Notes (Signed)
Physical Therapy Cancellation Note   08/02/18 1342  PT Visit Information  Last PT Received On 08/02/18  Reason Eval/Treat Not Completed Patient at procedure or test/unavailable. PT will continue to follow acutely.    Earney Navy, PTA Acute Rehabilitation Services Pager: [phone] Office: [phone]

## 2018-08-02 NOTE — Progress Notes (Signed)
Pt's HR stays 120's since at the PACU. Paging Dr. Maylene Roes regarding this matter, no return calls. Monitor pt's HR. Night nurse made aware of it. HS [employer]

## 2018-08-02 NOTE — Op Note (Signed)
07/26/2018 - 08/02/2018  2:14 PM  PATIENT:  Sharon [name]  73 y.o. female  Patient Care Team: [name], MD as PCP - General Marlou Porch Thana Farr, MD as PCP - Cardiology (Cardiology)  PRE-OPERATIVE DIAGNOSIS:  Sigmoid stricture  POST-OPERATIVE DIAGNOSIS:  Same  PROCEDURE:  1. Exploratory laparotomy 2. Lysis of adhesions x 75 minutes 3. Partial sigmoidectomy 4. End colostomy  SURGEON:  Sharon Mt. Dema Severin, MD  ASSISTANT: Georganna Skeans, MD  ANESTHESIA:   general  COUNTS:  Sponge, needle and instrument counts were reported correct x2 at the conclusion of the operation.  EBL: 100 cc  DRAINS: 43 Fr round blake drain left draining the pelvis  SPECIMEN: Partial sigmoidectomy - sigmoid colon  COMPLICATIONS: None  FINDINGS: Dense pelvic adhesions of small bowel to small bowel, small bowel to abdominal wall, small bowel to colon.  Careful adhesiolysis took place.  One deserosalization that was less than 1 cm occurred and this was repaired.  Stricture deep in her pelvis on the distal sigmoid colon which was also densely adherent to her left pelvic sidewall.  Given difficulty related to habitus, inability to adequately visualize retroperitoneal structures, the decision was made to divide the colon just proximal to this.  The stricture was soft and was certainly not a large firm mass.  She had also had a colonoscopy 8 months prior which demonstrated no major pathology at this location aside from some narrowing.  This is presumably related to diverticular disease.  DISPOSITION: PACU in satisfactory condition  INDICATION: Ms. Kam is a very pleasant 61yoF with hx of HTN, DM, HLD, GERD, CHF, recent stroke/TIA, OA, who presented to the hospital with abdominal discomfort last week.  She was found to have some pneumatosis in her right colon and distention of the colon.  She had left lower quadrant pain at that time.  She was admitted and started on IV antibiotics and improved.  Her Annalie Wenner  blood cell count normalized.  She is still having partial obstructive symptoms and underwent evaluation with CT scan which demonstrated near resolution of her pneumatosis but persistent dilation of her colon.  It was determined she may have an underlying adynamic ileus.  She subsequently was endoscoped which demonstrated a tight narrowing of her sigmoid colon associated diverticulosis.  She does have a history of diverticulitis.  At this point it was presumed that she had a diverticular associated stricture.  She had a prior colonoscopy in April 2019 which demonstrated this area to be open.  There was no mass or polypoid changes noted at this location.  Despite a week of antibiotics and conservative therapy, her symptoms persisted.  Given this, options were discussed.  She opted to undergo surgical intervention.  Please refer to notes elsewhere for details regarding this discussion.  DESCRIPTION: The patient was identified in preop holding and taken to the OR where they were placed on the operating room table and SCDs were placed. General endotracheal anesthesia was induced without difficulty.  The patient was positioned in lithotomy with Allen stirrups.  Pressure points were then padded and verified.  The patient was then prepped and draped in the usual sterile fashion. A surgical timeout was performed indicating the correct patient, procedure, positioning and need for preoperative antibiotics.   A midline incision was created around the umbilicus.  Dissection was carried down through the subcutaneous tissue to the linea alba.  This was then incised.  The fascia was grasped between Kocher clamps and the peritoneum was entered with Metzenbaum scissors.  The incision was lengthened.  Somewhat dense adhesions were identified and encountered in the infraumbilical midline.  There were adhesions during small bowel and the abdominal wall, small bowel and omentum, small bowel and small bowel, and small bowel and colon.   These were carefully lysed sharply.  There was a 5 mm deserosalization on the small bowel that occurred during this process which was repaired with 3-0 Vicryl suture in a Lembert fashion.  Adhesions from the pelvis were also lysed sharply.  All bowel was then inspected and there were no other deserosalization's identified.  The entire colon was quite dilated which made visualization difficult.  Working down towards where the stricture was, a colotomy was created for decompression.  Extensive amounts of gas and liquid stool were then evacuated.  Following this, the colon was much more mobile and visualization was improved.  Working down into the pelvis and a lateral to medial fashion, peri-sigmoidal attachments were taken off of the left pelvic sidewall, staying well above the retroperitoneal structures.  The level of the stricture was identified deep in the pelvis and at this location, presumably from prior attacks of diverticulitis, it was densely adherent to the pelvic sidewall.  Given her habitus and difficulty visualizing anatomy deep in her pelvis, the decision was made to not pursue further dissection at this level so as to prevent any injury to the left ureter.  Working just above this location, the colon was mobilized laterally.  Hugging the colon, a window was created in the mesentery and a contoured green load stapler was applied.  This was closed, held, and then fired.  One small area had some serosal puckering on the according the staple line and this was reinforced with 0 silk sutures.  The staple line was otherwise completely intact and the stump appeared viable.  The sigmoid colon mesentery was mobilized medially.  Hugging the sigmoid colon, a few centimeters of the mesentery were divided using the Ligasure.  This was well above and away from the left ureter and certainly well above the IMA pedicle.  The remainder of the colon was inspected.  The descending, transverse, ascending, and cecum were  all clearly viable.  There were no areas of necrosis/ischemia.  Everything was pink.  Attention was turned to bring up the sigmoid colostomy.  A disc of skin was excised as well as the associated subcutaneous tissue.  The anterior rectus fascia was identified.  Kocher clamps were applied to the midline fascia and tension applied.  The rectus fascia was incised cruciate Lee anteriorly.  The rectus muscle fibers were then spread.  The posterior sheath/peritoneum was then incised.  The colostomy hole was large enough to accommodate 2 fingers.  The sigmoid colon was then passed through the stoma site and reached well without any tension. Orientation was then confirmed and there was no twisting.  The abdomen was then irrigated.  Hemostasis was then verified.  The small bowel was reinspected and no additional deserosalization's or injuries were identified.  Omentum was brought down to cover the small bowel.  The midline fascia was then closed with 2 running #1 looped PDS sutures.  Subcutaneous tissue was then irrigated with sterile saline.  Skin was then closed with staples.  The wound was then covered with sterile towels.  Attention was then turned to maturing the colostomy.  Working back to a point of clear viability and well below where the colotomy had been made, the remnant sigmoid colon was excised.  This was then passed  off the specimen.  Staple line is distal.  Hemostasis was then verified in the mesentery.  The colostomy was then matured using 3-0 Vicryl sutures.  A sterile dressing was applied to the midline incision.  A colostomy appliance was cut and applied to the colostomy site.  The patient was then awakened from anesthesia, extubated, and transferred to a stretcher for transport to PACU in satisfactory condition.

## 2018-08-02 NOTE — Transfer of Care (Signed)
Immediate Anesthesia Transfer of Care Note  Patient: Sharon [name]  Procedure(s) Performed: EXPLORATORY LAPAROTOMY (N/A Abdomen) LYSIS OF ADHESION (N/A ) PARTIAL COLECTOMYsigmoid (N/A ) end COLOSTOMY (N/A )  Patient Location: PACU  Anesthesia Type:General  Level of Consciousness: awake, alert  and oriented  Airway & Oxygen Therapy: Patient Spontanous Breathing and Patient connected to face mask oxygen  Post-op Assessment: Report given to RN, Post -op Vital signs reviewed and stable and Patient moving all extremities X 4  Post vital signs: Reviewed and stable  Last Vitals:  Vitals Value Taken Time  BP 143/71 08/02/2018  2:39 PM  Temp    Pulse 99 08/02/2018  2:39 PM  Resp 23 08/02/2018  2:39 PM  SpO2 97 % 08/02/2018  2:39 PM  Vitals shown include unvalidated device data.  Last Pain:  Vitals:   08/02/18 0926  TempSrc: Oral  PainSc:       Patients Stated Pain Goal: 2 (45/62/56 3893)  Complications: No apparent anesthesia complications

## 2018-08-02 NOTE — Progress Notes (Signed)
Patient ID: Jakyrah Holladay, female   DOB: 07-22-1945, 73 y.o.   MRN: [id number] [facility] Progress Note:   1 Day Post-Op  Subjective: Stable; still with distention and left sided discomfort. Passing gas, having occasional BMs as well. No n/v.  Objective: Vital signs in last 24 hours: Temp:  [97.9 F (36.6 C)-98.3 F (36.8 C)] 98.3 F (36.8 C) (01/20 0412) Pulse Rate:  [72-82] 72 (01/20 0500) Resp:  [17-19] 19 (01/20 0412) BP: (156-192)/(70-93) 156/70 (01/20 0500) SpO2:  [96 %-100 %] 100 % (01/20 0412) Weight:  [124.8 kg] 124.8 kg (01/20 0459)  Intake/Output from previous day: 01/19 0701 - 01/20 0700 In: 1795.7 [I.V.:1613.2; IV Piggyback:182.5] Out: 4235 [Urine:1180] Intake/Output this shift: No intake/output data recorded.  Physical Exam:  NAD, comfortable RRR Abdomen is obese, ttp in LLQ; no tenderness in R abdomen. Distended but comfortable.    Lab Results:  Results for orders placed or performed during the hospital encounter of 07/26/18 (from the past 48 hour(s))  Glucose, capillary     Status: None   Collection Time: 07/31/18 11:57 AM  Result Value Ref Range   Glucose-Capillary 99 70 - 99 mg/dL  Glucose, capillary     Status: Abnormal   Collection Time: 07/31/18  4:30 PM  Result Value Ref Range   Glucose-Capillary 119 (H) 70 - 99 mg/dL  Glucose, capillary     Status: Abnormal   Collection Time: 07/31/18  8:04 PM  Result Value Ref Range   Glucose-Capillary 109 (H) 70 - 99 mg/dL  Glucose, capillary     Status: Abnormal   Collection Time: 08/01/18 12:53 AM  Result Value Ref Range   Glucose-Capillary 116 (H) 70 - 99 mg/dL  Basic metabolic panel     Status: Abnormal   Collection Time: 08/01/18  2:44 AM  Result Value Ref Range   Sodium 146 (H) 135 - 145 mmol/L   Potassium 3.8 3.5 - 5.1 mmol/L    Comment: SLIGHT HEMOLYSIS   Chloride 119 (H) 98 - 111 mmol/L   CO2 13 (L) 22 - 32 mmol/L   Glucose, Bld 109 (H) 70 - 99 mg/dL   BUN 10 8 - 23 mg/dL   Creatinine, Ser 1.03 (H) 0.44 - 1.00 mg/dL   Calcium 8.8 (L) 8.9 - 10.3 mg/dL   GFR calc non Af Amer 55 (L) >60 mL/min   GFR calc Af Amer >60 >60 mL/min   Anion gap 14 5 - 15    Comment: Performed at [facility], [address] [place], [place] [address]  Magnesium     Status: None   Collection Time: 08/01/18  2:44 AM  Result Value Ref Range   Magnesium 2.1 1.7 - 2.4 mg/dL    Comment: Performed at [facility] [address], [place], [place] [address]  Glucose, capillary     Status: Abnormal   Collection Time: 08/01/18  4:15 AM  Result Value Ref Range   Glucose-Capillary 116 (H) 70 - 99 mg/dL  CBC     Status: Abnormal   Collection Time: 08/01/18  6:44 AM  Result Value Ref Range   WBC 12.3 (H) 4.0 - 10.5 K/uL   RBC 3.78 (L) 3.87 - 5.11 MIL/uL   Hemoglobin 10.8 (L) 12.0 - 15.0 g/dL   HCT 35.2 (L) 36.0 - 46.0 %   MCV 93.1 80.0 - 100.0 fL   MCH 28.6 26.0 - 34.0 pg   MCHC 30.7 30.0 - 36.0 g/dL   RDW 14.0  11.5 - 15.5 %   Platelets 339 150 - 400 K/uL   nRBC 0.0 0.0 - 0.2 %    Comment: Performed at [facility], [place] [address] [place], [place] [address]  Glucose, capillary     Status: Abnormal   Collection Time: 08/01/18  7:44 AM  Result Value Ref Range   Glucose-Capillary 120 (H) 70 - 99 mg/dL  Glucose, capillary     Status: Abnormal   Collection Time: 08/01/18  1:12 PM  Result Value Ref Range   Glucose-Capillary 124 (H) 70 - 99 mg/dL  Glucose, capillary     Status: Abnormal   Collection Time: 08/01/18  4:17 PM  Result Value Ref Range   Glucose-Capillary 136 (H) 70 - 99 mg/dL  Glucose, capillary     Status: Abnormal   Collection Time: 08/01/18  7:42 PM  Result Value Ref Range   Glucose-Capillary 140 (H) 70 - 99 mg/dL  Glucose, capillary     Status: Abnormal   Collection Time: 08/01/18 11:56 PM  Result Value Ref Range   Glucose-Capillary 137 (H) 70 - 99 mg/dL  CBC     Status: Abnormal   Collection Time: 08/02/18  2:30 AM  Result Value Ref Range   WBC  14.4 (H) 4.0 - 10.5 K/uL   RBC 3.87 3.87 - 5.11 MIL/uL   Hemoglobin 11.3 (L) 12.0 - 15.0 g/dL   HCT 36.2 36.0 - 46.0 %   MCV 93.5 80.0 - 100.0 fL   MCH 29.2 26.0 - 34.0 pg   MCHC 31.2 30.0 - 36.0 g/dL   RDW 14.3 11.5 - 15.5 %   Platelets 384 150 - 400 K/uL   nRBC 0.0 0.0 - 0.2 %    Comment: Performed at [facility], [place]. [address], [place],  [address]  Basic metabolic panel     Status: Abnormal   Collection Time: 08/02/18  2:30 AM  Result Value Ref Range   Sodium 147 (H) 135 - 145 mmol/L   Potassium 3.3 (L) 3.5 - 5.1 mmol/L   Chloride 119 (H) 98 - 111 mmol/L   CO2 15 (L) 22 - 32 mmol/L   Glucose, Bld 135 (H) 70 - 99 mg/dL   BUN 14 8 - 23 mg/dL   Creatinine, Ser 1.14 (H) 0.44 - 1.00 mg/dL   Calcium 9.2 8.9 - 10.3 mg/dL   GFR calc non Af Amer 49 (L) >60 mL/min   GFR calc Af Amer 56 (L) >60 mL/min   Anion gap 13 5 - 15    Comment: Performed at [facility] [address], [place], [place] [address]  Glucose, capillary     Status: Abnormal   Collection Time: 08/02/18  4:09 AM  Result Value Ref Range   Glucose-Capillary 125 (H) 70 - 99 mg/dL  Glucose, capillary     Status: Abnormal   Collection Time: 08/02/18  8:32 AM  Result Value Ref Range   Glucose-Capillary 138 (H) 70 - 99 mg/dL    Radiology/Results: No results found.  Anti-infectives: Anti-infectives (From admission, onward)   Start     Dose/Rate Route Frequency Ordered Stop   07/27/18 0400  piperacillin-tazobactam (ZOSYN) IVPB 3.375 g     3.375 g 12.5 mL/hr over 240 Minutes Intravenous Every 8 hours 07/26/18 2203     07/26/18 2130  piperacillin-tazobactam (ZOSYN) IVPB 3.375 g     3.375 g 100 mL/hr over 30 Minutes Intravenous  Once 07/26/18 2129 07/26/18 2218  Assessment/Plan: Problem List: Patient Active Problem List   Diagnosis Date Noted  . Elevated LFTs 07/27/2018  . Aortic atherosclerosis ([place]) 07/27/2018  . 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 Cerebral thrombosis with cerebral  infarction 05/11/2018  . CVA (cerebral vascular accident) ([place]) 05/11/2018  . Stroke-like symptoms 05/10/2018  . Hyperosmolar (nonketotic) coma ([place]) 04/15/2016  . Seizure ([place]) 04/15/2016  . AKI (acute kidney injury) ([place]) 04/15/2016  . Chronic diastolic heart failure ([place]) 04/15/2016  . Chronic diastolic CHF (congestive heart failure) ([place]) 06/04/2015  . Diverticulitis 05/28/2015  . Sigmoid diverticulitis 05/28/2015  . Hypokalemia 05/28/2015  . Diabetes mellitus type 2, controlled ([place]) 05/28/2015  . Chronic anemia 05/28/2015  . Diverticulitis of large intestine without perforation or abscess without bleeding   . Palpitation 04/12/2015  . Morbid obesity ([place]) 04/12/2015  . Mild aortic stenosis 04/12/2015  . Type 2 diabetes mellitus without complication ([place]) 60/73/7106  . Diabetes mellitus ([place]) 06/27/2014  . Hypertension 06/27/2014  . Chest pain 06/27/2014  . Asthma 06/27/2014   -The anatomy and physiology of the GI tract has been discussed at length with the patient. The pathophysiology of diverticular strictures as well as malignancy was discussed at length with her as well -We discussed options moving forward - including exploratory laparotomy with Hartmann's procedure and permanent colostomy; we discussed possibility of subtotal colectomy/ileostomy if intraoperative findings dictate that this is necessary. Given persistent symptoms despite antibiotics and now nonresolution despite conservative measures, she has opted to proceed with surgery -Cardiology clearance has also been obtained -The planned procedure, material risks (including, but not limited to, pain, bleeding, infection, scarring, need for blood transfusion, damage to surrounding structures- blood vessels/nerves/viscus/organs, damage to ureter, urine leak, leak rectal stump, need for additional procedures, worsening of pre-existing medical conditions, hernia, recurrence, pneumonia, heart attack, stroke, death) benefits and  alternatives to surgery were discussed at length. The patient's questions were answered to her satisfaction, she voiced understanding and elected to proceed with surgery. Additionally, we discussed typical postoperative expectations and the recovery process.   LOS: 6 days   Sharon Mt. Dema Severin, M.D. [facility]  08/02/2018 9:04 AM

## 2018-08-02 NOTE — Anesthesia Procedure Notes (Signed)
Central Venous Catheter Insertion Performed by: Roderic Palau, MD, anesthesiologist Start/End1/20/2020 10:35 AM, 08/02/2018 10:45 AM Patient location: Pre-op. Preanesthetic checklist: patient identified, IV checked, site marked, risks and benefits discussed, surgical consent, monitors and equipment checked, pre-op evaluation, timeout performed and anesthesia consent Position: Trendelenburg Lidocaine 1% used for infiltration and patient sedated Hand hygiene performed , maximum sterile barriers used  and Seldinger technique used Catheter size: 8 Fr Total catheter length 16. Central line was placed.Double lumen Procedure performed using ultrasound guided technique. Ultrasound Notes:anatomy identified, needle tip was noted to be adjacent to the nerve/plexus identified, no ultrasound evidence of intravascular and/or intraneural injection and image(s) printed for medical record Attempts: 1 Following insertion, dressing applied, line sutured and Biopatch. Post procedure assessment: blood return through all ports  Patient tolerated the procedure well with no immediate complications.

## 2018-08-02 NOTE — Anesthesia Preprocedure Evaluation (Addendum)
Anesthesia Evaluation  Patient identified by MRN, date of birth, ID band Patient awake    Reviewed: Allergy & Precautions, H&P , NPO status , Patient's Chart, lab work & pertinent test results, reviewed documented beta blocker date and time   Airway Mallampati: II  TM Distance: >3 FB Neck ROM: Full    Dental no notable dental hx. (+) Edentulous Upper, Edentulous Lower, Dental Advisory Given   Pulmonary asthma ,    Pulmonary exam normal breath sounds clear to auscultation       Cardiovascular hypertension, Pt. on medications and Pt. on home beta blockers + Peripheral Vascular Disease and +CHF  + dysrhythmias Atrial Fibrillation + Valvular Problems/Murmurs AS  Rhythm:Regular Rate:Normal     Neuro/Psych Seizures -,  Anxiety Depression CVA, No Residual Symptoms    GI/Hepatic Neg liver ROS, GERD  Medicated and Controlled,  Endo/Other  diabetes, Insulin DependentMorbid obesity  Renal/GU negative Renal ROS  negative genitourinary   Musculoskeletal  (+) Arthritis , Osteoarthritis,    Abdominal   Peds  Hematology  (+) Blood dyscrasia, anemia ,   Anesthesia Other Findings   Reproductive/Obstetrics negative OB ROS                            Anesthesia Physical Anesthesia Plan  ASA: III  Anesthesia Plan: General   Post-op Pain Management:    Induction: Intravenous  PONV Risk Score and Plan: 4 or greater and Ondansetron, Dexamethasone, Treatment may vary due to age or medical condition and Midazolam  Airway Management Planned: Oral ETT  Additional Equipment: CVP and Ultrasound Guidance Line Placement  Intra-op Plan:   Post-operative Plan: Extubation in OR  Informed Consent: I have reviewed the patients History and Physical, chart, labs and discussed the procedure including the risks, benefits and alternatives for the proposed anesthesia with the patient or authorized representative who has  indicated his/her understanding and acceptance.   Patient has DNR.  Discussed DNR with patient and Suspend DNR.   Dental advisory given  Plan Discussed with: CRNA  Anesthesia Plan Comments:       Anesthesia Quick Evaluation

## 2018-08-02 NOTE — Consult Note (Signed)
[facility] Nurse requested for preoperative stoma site marking  Discussed surgical procedure and stoma creation with patient.  She is a former Therapist, sports and familiar with ostomy care, she states.   Explained role of the [place] nurse team.  Provided the patient with educational booklet and provided samples of pouching options.  Answered patient questions. Left message with Bufford Buttner, niece, per patient request regarding impending surgery and ostomy likely.     Examined patient lying, sitting in order to place the marking in the patient's visual field, away from any creases or abdominal contour issues and within the rectus muscle.  Patient with distended, rotund, pendulous abdomen.  Marked above umbilicus to promote self care within patient's field of vision.   Marked for colostomy in the LLQ  6 cm to the left of the umbilicus and 6 cm above the umbilicus.  Marked for ileostomy in the RLQ  6 cm to the right of the umbilicus and 6 cm above the umbilicus.   Patient's abdomen cleansed with CHG wipes at site markings, allowed to air dry prior to marking.Covered mark with thin film transparent dressing to preserve mark until date of surgery.   [facility] Nurse team will follow up with patient after surgery for continue ostomy care and teaching.   Domenic Moras MSN, RN, FNP-BC CWON Wound, Ostomy, Continence Nurse Pager [phone]

## 2018-08-02 NOTE — Anesthesia Procedure Notes (Signed)
Procedure Name: Intubation Date/Time: 08/02/2018 11:03 AM Performed by: Mariea Clonts, CRNA Pre-anesthesia Checklist: Patient identified, Emergency Drugs available, Suction available and Patient being monitored Patient Re-evaluated:Patient Re-evaluated prior to induction Oxygen Delivery Method: Circle System Utilized Preoxygenation: Pre-oxygenation with 100% oxygen Induction Type: IV induction, Cricoid Pressure applied and Rapid sequence Laryngoscope Size: Miller and 2 Grade View: Grade I Tube type: Oral Tube size: 7.0 mm Number of attempts: 1 Airway Equipment and Method: Stylet and Oral airway Placement Confirmation: ETT inserted through vocal cords under direct vision,  positive ETCO2 and breath sounds checked- equal and bilateral Tube secured with: Tape Dental Injury: Teeth and Oropharynx as per pre-operative assessment

## 2018-08-02 NOTE — Anesthesia Postprocedure Evaluation (Signed)
Anesthesia Post Note  Patient: Sharon [name]  Procedure(s) Performed: EXPLORATORY LAPAROTOMY (N/A Abdomen) LYSIS OF ADHESION (N/A ) PARTIAL COLECTOMYsigmoid (N/A ) end COLOSTOMY (N/A )     Patient location during evaluation: PACU Anesthesia Type: General Level of consciousness: awake and alert Pain management: pain level controlled Vital Signs Assessment: post-procedure vital signs reviewed and stable Respiratory status: spontaneous breathing, nonlabored ventilation and respiratory function stable Cardiovascular status: blood pressure returned to baseline and stable Postop Assessment: no apparent nausea or vomiting Anesthetic complications: no    Last Vitals:  Vitals:   08/02/18 1509 08/02/18 1524  BP: (!) 142/76 (!) 143/71  Pulse: (!) 122 92  Resp: 17 15  Temp:    SpO2: 95% 97%    Last Pain:  Vitals:   08/02/18 1530  TempSrc:   PainSc: Asleep                 Guillermo Nehring,W. EDMOND

## 2018-08-02 NOTE — Progress Notes (Signed)
PROGRESS NOTE    Sharon [name]  KPT:[id number] DOB: December 22, 1945 DOA: 07/26/2018 PCP: [name], MD     Brief Narrative:  Sharon [name] is a 73 yo female with past medical history significant for chronic diastolic heart failure, type 2 diabetes insulin-dependent, non-hemorrhagic CVA, atrial fibrillation seen on loop recorder reportedly, who presented with abdominal pain.  CT abdomen pelvis revealed pneumatosis of the colon of unclear etiology.  General surgery and GI have been consulted.  She underwent flex sigmoidoscopy on 1/17 and 1/18 which revealed severe stricture in the sigmoid colon.  General surgery was reconsulted.  New events last 24 hours / Subjective: Patient underwent ex lap, lysis of adhesion, partial sigmoidectomy, and colostomy.  Patient seen in PACU.  She is drowsy, just received some pain medications.  Assessment & Plan:   Principal Problem:   Pneumatosis intestinalis Active Problems:   Type 2 diabetes mellitus without complication (HCC)   Hypokalemia   Diabetes mellitus type 2, controlled (HCC)   Chronic diastolic CHF (congestive heart failure) (HCC)   AKI (acute kidney injury) ([place])   CVA (cerebral vascular accident) ([place])   Elevated LFTs   Aortic atherosclerosis (HCC)   Severe colonic stricture -CTabdomen and pelvis showed pneumatosis involving the cecum, ascending colon and hepatic flexure of the colon -AXR 1/15 with persistent colonic obstruction concerning for distal neoplasm -AXR 1/16 Marked gaseous distention of the colon -Repeat CT abdomen pelvis revealed near complete resolution of pneumatosis, no free air, no evidence of bowel obstruction, suspected adynamic colonic ileus -Underwent flex sigmoidoscopy 1/17, technically difficult procedure due to poor prep.  Planning on Gastrografin enema -Underwent flex sigmoidoscopy 1/18 which revealed stricture in the sigmoid colon -GI, general surgery following -S/p ex lap, lysis of adhesion, partial  sigmoidectomy, and colostomy   Acute kidney injury -Baseline creatinine 0.9, resolved  Insulin-dependent diabetes type 2 -Continue Lantus, sliding scale insulin  Chronic diastolic heart failure -Euvolemic currently continue to monitor   History of non-hemorrhagic CVA Paroxysmal A Fib -Continue Eliquis when cleared by surgical team  -Follows with Dr. Marlou Porch as outpatient   Morbid obesity -Body mass index is 55.57 kg/m.  Hypokalemia -Replace, trend  Hypernatremia/hyperchloremia -Stop NS IVF. Start D5-0.45NS IVF    DVT prophylaxis: Eliquis when able, continue SCD Code Status: Full code Family Communication: No family at bedside  Disposition Plan: Pending post-op course. SNF planned when clinically improved for discharge    Consultants:   GI  General surgery  Cardiology   Procedures:   Flex sigmoidoscopy 1/17  Flex sigmoidoscopy 1/18  Antimicrobials:  Anti-infectives (From admission, onward)   Start     Dose/Rate Route Frequency Ordered Stop   08/02/18 1300  piperacillin-tazobactam (ZOSYN) IVPB 3.375 g     3.375 g 100 mL/hr over 30 Minutes Intravenous To Surgery 08/02/18 1254 08/02/18 1358   07/27/18 0400  [MAR Hold]  piperacillin-tazobactam (ZOSYN) IVPB 3.375 g     (MAR Hold since Mon 08/02/2018 at 0949. Reason: Transfer to a Procedural area.)   3.375 g 12.5 mL/hr over 240 Minutes Intravenous Every 8 hours 07/26/18 2203     07/26/18 2130  piperacillin-tazobactam (ZOSYN) IVPB 3.375 g     3.375 g 100 mL/hr over 30 Minutes Intravenous  Once 07/26/18 2129 07/26/18 2218       Objective: Vitals:   08/02/18 1539 08/02/18 1554 08/02/18 1609 08/02/18 1624  BP: (!) 138/51 140/80 (!) 149/70 (!) 148/74  Pulse: 90 (!) 122 96 96  Resp: 13 15 14 13   Temp:  TempSrc:      SpO2: 98% 98% 98% 98%  Weight:      Height:        Intake/Output Summary (Last 24 hours) at 08/02/2018 1715 Last data filed at 08/02/2018 1635 Gross per 24 hour  Intake 3845.71 ml  Output  875 ml  Net 2970.71 ml   Filed Weights   07/31/18 0823 08/01/18 0500 08/02/18 0459  Weight: 114.8 kg 124 kg 124.8 kg    Examination: General exam: Appears calm and comfortable, drowsy   Respiratory system: Clear to auscultation. Respiratory effort normal. Cardiovascular system: S1 & S2 heard, RRR. No JVD, murmurs, rubs, gallops or clicks. No pedal edema. Gastrointestinal system: Abdomen is nondistended, soft  Central nervous system: Alert to voice, drowsy  Extremities: Symmetric  Skin: No rashes, lesions or ulcers Psychiatry: Judgement and insight appear normal  Data Reviewed: I have personally reviewed following labs and imaging studies  CBC: Recent Labs  Lab 07/27/18 0339 07/28/18 0214 07/29/18 0235 07/30/18 0613 08/01/18 0644 08/02/18 0230  WBC 14.6* 13.4* 12.0* 9.1 12.3* 14.4*  NEUTROABS 13.7*  --   --   --   --   --   HGB 12.1 10.1* 10.5* 10.3* 10.8* 11.3*  HCT 37.4 31.5* 32.8* 32.9* 35.2* 36.2  MCV 91.2 92.1 91.9 91.6 93.1 93.5  PLT 361 344 357 323 339 378   Basic Metabolic Panel: Recent Labs  Lab 07/27/18 0339  07/29/18 0235 07/30/18 0613 07/31/18 0250 08/01/18 0244 08/02/18 0230  NA 142   < > 144 143 144 146* 147*  K 3.0*   < > 3.2* 3.2* 4.0 3.8 3.3*  CL 107   < > 116* 116* 116* 119* 119*  CO2 21*   < > 20* 18* 17* 13* 15*  GLUCOSE 231*   < > 174* 97 111* 109* 135*  BUN 21   < > 15 7* 8 10 14   CREATININE 1.31*   < > 0.97 0.90 0.91 1.03* 1.14*  CALCIUM 8.8*   < > 8.6* 8.6* 8.9 8.8* 9.2  MG 2.5*  --   --  2.0  --  2.1  --   PHOS  --   --   --  1.9*  --   --   --    < > = values in this interval not displayed.   GFR: Estimated Creatinine Clearance: 54.9 mL/min (A) (by C-G formula based on SCr of 1.14 mg/dL (H)). Liver Function Tests: Recent Labs  Lab 07/27/18 0339 07/28/18 0214 07/29/18 0235  AST 145* 78* 41  ALT 61* 67* 59*  ALKPHOS 166* 140* 139*  BILITOT 1.8* 0.8 0.4  PROT 6.6 5.8* 5.9*  ALBUMIN 2.9* 2.4* 2.4*   No results for input(s):  LIPASE, AMYLASE in the last 168 hours. No results for input(s): AMMONIA in the last 168 hours. Coagulation Profile: No results for input(s): INR, PROTIME in the last 168 hours. Cardiac Enzymes: No results for input(s): CKTOTAL, CKMB, CKMBINDEX, TROPONINI in the last 168 hours. BNP (last 3 results) No results for input(s): PROBNP in the last 8760 hours. HbA1C: No results for input(s): HGBA1C in the last 72 hours. CBG: Recent Labs  Lab 08/01/18 2356 08/02/18 0409 08/02/18 0832 08/02/18 1259 08/02/18 1441  GLUCAP 137* 125* 138* 152* 156*   Lipid Profile: No results for input(s): CHOL, HDL, LDLCALC, TRIG, CHOLHDL, LDLDIRECT in the last 72 hours. Thyroid Function Tests: No results for input(s): TSH, T4TOTAL, FREET4, T3FREE, THYROIDAB in the last 72 hours. Anemia Panel: No  results for input(s): VITAMINB12, FOLATE, FERRITIN, TIBC, IRON, RETICCTPCT in the last 72 hours. Sepsis Labs: Recent Labs  Lab 07/26/18 1907 07/27/18 1322  LATICACIDVEN 1.03 1.4    Recent Results (from the past 240 hour(s))  Culture, Urine     Status: None   Collection Time: 07/27/18  4:51 PM  Result Value Ref Range Status   Specimen Description URINE, CATHETERIZED  Final   Special Requests NONE  Final   Culture   Final    NO GROWTH Performed at [facility], [place] [address], [place], [place] [address]    Report Status 07/28/2018 FINAL  Final       Radiology Studies: No results found.    Scheduled Meds: . HYDROmorphone      . [MAR Hold] insulin aspart  0-9 Units Subcutaneous Q4H  . [MAR Hold] insulin glargine  35 Units Subcutaneous Daily   Continuous Infusions: . [MAR Hold] sodium chloride 0 mL/hr at 07/30/18 0508  . dextrose 5 % and 0.45% NaCl 75 mL/hr at 08/02/18 0824  . methocarbamol (ROBAXIN) IV    . [MAR Hold] piperacillin-tazobactam (ZOSYN)  IV 12.5 mL/hr at 08/02/18 0600     LOS: 6 days    Time spent: 20 minutes   Dessa Phi, DO [facility] [email or web address] 08/02/2018, 5:15 PM

## 2018-08-03 ENCOUNTER — Encounter (HOSPITAL_COMMUNITY): Payer: Self-pay | Admitting: Surgery

## 2018-08-03 DIAGNOSIS — I4891 Unspecified atrial fibrillation: Secondary | ICD-10-CM

## 2018-08-03 LAB — CBC
HCT: 31.4 % — ABNORMAL LOW (ref 36.0–46.0)
Hemoglobin: 9.7 g/dL — ABNORMAL LOW (ref 12.0–15.0)
MCH: 29.4 pg (ref 26.0–34.0)
MCHC: 30.9 g/dL (ref 30.0–36.0)
MCV: 95.2 fL (ref 80.0–100.0)
Platelets: 339 10*3/uL (ref 150–400)
RBC: 3.3 MIL/uL — AB (ref 3.87–5.11)
RDW: 14.7 % (ref 11.5–15.5)
WBC: 30 10*3/uL — ABNORMAL HIGH (ref 4.0–10.5)
nRBC: 0 % (ref 0.0–0.2)

## 2018-08-03 LAB — BASIC METABOLIC PANEL
Anion gap: 11 (ref 5–15)
BUN: 20 mg/dL (ref 8–23)
CHLORIDE: 119 mmol/L — AB (ref 98–111)
CO2: 13 mmol/L — ABNORMAL LOW (ref 22–32)
CREATININE: 1.77 mg/dL — AB (ref 0.44–1.00)
Calcium: 9 mg/dL (ref 8.9–10.3)
GFR calc Af Amer: 33 mL/min — ABNORMAL LOW (ref >60)
GFR calc non Af Amer: 29 mL/min — ABNORMAL LOW (ref >60)
Glucose, Bld: 284 mg/dL — ABNORMAL HIGH (ref 70–99)
POTASSIUM: 3.8 mmol/L (ref 3.5–5.1)
Sodium: 143 mmol/L (ref 135–145)

## 2018-08-03 LAB — GLUCOSE, CAPILLARY
GLUCOSE-CAPILLARY: 248 mg/dL — AB (ref 70–99)
Glucose-Capillary: 246 mg/dL — ABNORMAL HIGH (ref 70–99)
Glucose-Capillary: 284 mg/dL — ABNORMAL HIGH (ref 70–99)
Glucose-Capillary: 206 mg/dL — ABNORMAL HIGH (ref 70–99)
Glucose-Capillary: 200 mg/dL — ABNORMAL HIGH (ref 70–99)
Glucose-Capillary: 153 mg/dL — ABNORMAL HIGH (ref 70–99)

## 2018-08-03 MED ORDER — LACTATED RINGERS IV SOLN
INTRAVENOUS | Status: DC
  Administered 2018-08-03 – 2018-08-04 (×3): via INTRAVENOUS

## 2018-08-03 NOTE — Progress Notes (Signed)
PROGRESS NOTE    Sharon [name]  MCN:[id number] DOB: May 02, 1946 DOA: 07/26/2018 PCP: [name], MD     Brief Narrative:  Sharon [name] is a 73 yo female with past medical history significant for chronic diastolic heart failure, type 2 diabetes insulin-dependent, non-hemorrhagic CVA, atrial fibrillation seen on loop recorder reportedly, who presented with abdominal pain.  CT abdomen pelvis revealed pneumatosis of the colon of unclear etiology.  General surgery and GI have been consulted.  She underwent flex sigmoidoscopy on 1/17 and 1/18 which revealed severe stricture in the sigmoid colon.  General surgery was reconsulted. Patient underwent ex lap, lysis of adhesion, partial sigmoidectomy, and colostomy on 1/20.   New events last 24 hours / Subjective: Patient drowsy this morning, states that she is doing okay.  Assessment & Plan:   Principal Problem:   Pneumatosis intestinalis Active Problems:   Type 2 diabetes mellitus without complication (HCC)   Hypokalemia   Diabetes mellitus type 2, controlled (HCC)   Chronic diastolic CHF (congestive heart failure) (HCC)   AKI (acute kidney injury) ([place])   CVA (cerebral vascular accident) ([place])   Elevated LFTs   Aortic atherosclerosis (HCC)   Severe colonic stricture -CTabdomen and pelvis showed pneumatosis involving the cecum, ascending colon and hepatic flexure of the colon -AXR 1/15 with persistent colonic obstruction concerning for distal neoplasm -AXR 1/16 Marked gaseous distention of the colon -Repeat CT abdomen pelvis revealed near complete resolution of pneumatosis, no free air, no evidence of bowel obstruction, suspected adynamic colonic ileus -Underwent flex sigmoidoscopy 1/17, technically difficult procedure due to poor prep.  Subsequently underwent gastrografin enema -Underwent flex sigmoidoscopy 1/18 which revealed stricture in the sigmoid colon -S/p ex lap, lysis of adhesion, partial sigmoidectomy, and colostomy  1/20 -Per general surgery  Acute kidney injury -Baseline creatinine 0.9 -Worsening creatinine after operation, LR started, trend BMP  Insulin-dependent diabetes type 2 -Continue Lantus, sliding scale insulin.  Blood sugar elevated in setting of D5-0.45NS yesterday.  Stop D5-0.45NS, start LR  Chronic diastolic heart failure -Euvolemic currently continue to monitor while on LR  History of non-hemorrhagic CVA Paroxysmal A Fib -Continue Eliquis when cleared by surgical team  -Follows with Dr. Marlou Porch as outpatient   Morbid obesity -Body mass index is 56.42 kg/m.  Hypernatremia/hyperchloremia -Improved after D5-0.45NS    DVT prophylaxis: Eliquis when able, currently on Lovenox Code Status: Full code Family Communication: No family at bedside  Disposition Plan: Pending post-op course. SNF planned when clinically improved for discharge    Consultants:   GI  General surgery  Cardiology   Procedures:   Flex sigmoidoscopy 1/17  Flex sigmoidoscopy 1/18  Antimicrobials:  Anti-infectives (From admission, onward)   Start     Dose/Rate Route Frequency Ordered Stop   08/02/18 1300  piperacillin-tazobactam (ZOSYN) IVPB 3.375 g     3.375 g 100 mL/hr over 30 Minutes Intravenous To Surgery 08/02/18 1254 08/02/18 1358   07/27/18 0400  piperacillin-tazobactam (ZOSYN) IVPB 3.375 g  Status:  Discontinued     3.375 g 12.5 mL/hr over 240 Minutes Intravenous Every 8 hours 07/26/18 2203 08/02/18 1820   07/26/18 2130  piperacillin-tazobactam (ZOSYN) IVPB 3.375 g     3.375 g 100 mL/hr over 30 Minutes Intravenous  Once 07/26/18 2129 07/26/18 2218       Objective: Vitals:   08/03/18 0314 08/03/18 0426 08/03/18 0700 08/03/18 0723  BP: 133/62  (!) 158/82   Pulse: 84  (!) 121 76  Resp: 15  14 14   Temp: 98.1 F (36.7  C)   (!) 97.5 F (36.4 C)  TempSrc: Oral   Oral  SpO2: 98%  97% 97%  Weight:  126.7 kg    Height:        Intake/Output Summary (Last 24 hours) at 08/03/2018  0956 Last data filed at 08/03/2018 0809 Gross per 24 hour  Intake 3222.93 ml  Output 1260 ml  Net 1962.93 ml   Filed Weights   08/01/18 0500 08/02/18 0459 08/03/18 0426  Weight: 124 kg 124.8 kg 126.7 kg    Examination: General exam: Appears calm and comfortable, drowsy this morning Respiratory system: Clear to auscultation. Respiratory effort normal. Cardiovascular system: S1 & S2 heard, RRR rate 70s. No JVD, murmurs, rubs, gallops or clicks. No pedal edema. Gastrointestinal system: Abdomen is mildly distended, soft. No organomegaly or masses felt. Central nervous system: Alert to voice, nonfocal Extremities: Symmetric 5 x 5 power. Skin: No rashes, lesions or ulcers Psychiatry: Judgement and insight appear normal. Mood & affect appropriate.    Data Reviewed: I have personally reviewed following labs and imaging studies  CBC: Recent Labs  Lab 07/29/18 0235 07/30/18 0613 08/01/18 0644 08/02/18 0230 08/03/18 0325  WBC 12.0* 9.1 12.3* 14.4* 30.0*  HGB 10.5* 10.3* 10.8* 11.3* 9.7*  HCT 32.8* 32.9* 35.2* 36.2 31.4*  MCV 91.9 91.6 93.1 93.5 95.2  PLT 357 323 339 384 099   Basic Metabolic Panel: Recent Labs  Lab 07/30/18 0613 07/31/18 0250 08/01/18 0244 08/02/18 0230 08/03/18 0325  NA 143 144 146* 147* 143  K 3.2* 4.0 3.8 3.3* 3.8  CL 116* 116* 119* 119* 119*  CO2 18* 17* 13* 15* 13*  GLUCOSE 97 111* 109* 135* 284*  BUN 7* 8 10 14 20   CREATININE 0.90 0.91 1.03* 1.14* 1.77*  CALCIUM 8.6* 8.9 8.8* 9.2 9.0  MG 2.0  --  2.1  --   --   PHOS 1.9*  --   --   --   --    GFR: Estimated Creatinine Clearance: 35.8 mL/min (A) (by C-G formula based on SCr of 1.77 mg/dL (H)). Liver Function Tests: Recent Labs  Lab 07/28/18 0214 07/29/18 0235  AST 78* 41  ALT 67* 59*  ALKPHOS 140* 139*  BILITOT 0.8 0.4  PROT 5.8* 5.9*  ALBUMIN 2.4* 2.4*   No results for input(s): LIPASE, AMYLASE in the last 168 hours. No results for input(s): AMMONIA in the last 168  hours. Coagulation Profile: No results for input(s): INR, PROTIME in the last 168 hours. Cardiac Enzymes: No results for input(s): CKTOTAL, CKMB, CKMBINDEX, TROPONINI in the last 168 hours. BNP (last 3 results) No results for input(s): PROBNP in the last 8760 hours. HbA1C: No results for input(s): HGBA1C in the last 72 hours. CBG: Recent Labs  Lab 08/02/18 1825 08/02/18 1950 08/02/18 2316 08/03/18 0315 08/03/18 0805  GLUCAP 183* 211* 246* 248* 284*   Lipid Profile: No results for input(s): CHOL, HDL, LDLCALC, TRIG, CHOLHDL, LDLDIRECT in the last 72 hours. Thyroid Function Tests: No results for input(s): TSH, T4TOTAL, FREET4, T3FREE, THYROIDAB in the last 72 hours. Anemia Panel: No results for input(s): VITAMINB12, FOLATE, FERRITIN, TIBC, IRON, RETICCTPCT in the last 72 hours. Sepsis Labs: Recent Labs  Lab 07/27/18 1322  LATICACIDVEN 1.4    Recent Results (from the past 240 hour(s))  Culture, Urine     Status: None   Collection Time: 07/27/18  4:51 PM  Result Value Ref Range Status   Specimen Description URINE, CATHETERIZED  Final   Special Requests NONE  Final   Culture   Final    NO GROWTH Performed at [facility], [place] [address] [place], [place] [address]    Report Status 07/28/2018 FINAL  Final  MRSA PCR Screening     Status: None   Collection Time: 08/02/18  6:20 PM  Result Value Ref Range Status   MRSA by PCR NEGATIVE NEGATIVE Final    Comment:        The GeneXpert MRSA Assay (FDA approved for NASAL specimens only), is one component of a comprehensive MRSA colonization surveillance program. It is not intended to diagnose MRSA infection nor to guide or monitor treatment for MRSA infections. Performed at [facility], [place] [address], [place], [place] [address]        Radiology Studies: No results found.    Scheduled Meds: . acetaminophen  1,000 mg Oral Q6H  . enoxaparin (LOVENOX) injection  40 mg Subcutaneous Q24H  . insulin  aspart  0-9 Units Subcutaneous Q4H  . insulin glargine  35 Units Subcutaneous Daily   Continuous Infusions: . sodium chloride 0 mL/hr at 07/30/18 0508  . lactated ringers 100 mL/hr at 08/03/18 0809  . methocarbamol (ROBAXIN) IV 500 mg (08/03/18 0912)     LOS: 7 days    Time spent: 20 minutes   Dessa Phi, DO [facility] [email or web address] 08/03/2018, 9:56 AM

## 2018-08-03 NOTE — Progress Notes (Signed)
Progress Note  Patient Name: Sharon [name] Date of Encounter: 08/03/2018  Primary Cardiologist: Candee Furbish, MD   Subjective   Pt sleeping comfortably   Inpatient Medications    Scheduled Meds: . acetaminophen  1,000 mg Oral Q6H  . enoxaparin (LOVENOX) injection  40 mg Subcutaneous Q24H  . insulin aspart  0-9 Units Subcutaneous Q4H  . insulin glargine  35 Units Subcutaneous Daily   Continuous Infusions: . sodium chloride 0 mL/hr at 07/30/18 0508  . dextrose 5 % and 0.45% NaCl 75 mL/hr at 08/03/18 0214  . methocarbamol (ROBAXIN) IV 500 mg (08/02/18 2157)   PRN Meds: sodium chloride, hydrALAZINE, metoprolol tartrate, morphine injection, ondansetron **OR** ondansetron (ZOFRAN) IV, promethazine   Vital Signs    Vitals:   08/03/18 0000 08/03/18 0100 08/03/18 0314 08/03/18 0426  BP: (!) 125/56 (!) 121/54 133/62   Pulse: 81 82 84   Resp: 14 14 15    Temp:   98.1 F (36.7 C)   TempSrc:   Oral   SpO2: 98% 99% 98%   Weight:    126.7 kg  Height:        Intake/Output Summary (Last 24 hours) at 08/03/2018 0723 Last data filed at 08/03/2018 0500 Gross per 24 hour  Intake 2911.55 ml  Output 1135 ml  Net 1776.55 ml   Last 3 Weights 08/03/2018 08/02/2018 08/01/2018  Weight (lbs) 279 lb 5.2 oz 275 lb 2.2 oz 273 lb 5.9 oz  Weight (kg) 126.7 kg 124.8 kg 124 kg      Telemetry    SR   Short bursts of atrial fibrillation   - Personally Reviewed  ECG      Physical Exam   GEN: No acute distress.  Sleepting  Neck: Neck is full  Cardiac: RRR, no murmurs, rubs, or gallops.  Respiratory: Clear to auscultation bilaterally.anteirorly  GI: Soft,   MS: No edema   Labs    Chemistry Recent Labs  Lab 07/28/18 0214 07/29/18 0235  08/01/18 0244 08/02/18 0230 08/03/18 0325  NA 148* 144   < > 146* 147* 143  K 3.3* 3.2*   < > 3.8 3.3* 3.8  CL 114* 116*   < > 119* 119* 119*  CO2 23 20*   < > 13* 15* 13*  GLUCOSE 156* 174*   < > 109* 135* 284*  BUN 26* 15   < > 10 14 20     CREATININE 1.50* 0.97   < > 1.03* 1.14* 1.77*  CALCIUM 8.6* 8.6*   < > 8.8* 9.2 9.0  PROT 5.8* 5.9*  --   --   --   --   ALBUMIN 2.4* 2.4*  --   --   --   --   AST 78* 41  --   --   --   --   ALT 67* 59*  --   --   --   --   ALKPHOS 140* 139*  --   --   --   --   BILITOT 0.8 0.4  --   --   --   --   GFRNONAA 35* 59*   < > 55* 49* 29*  GFRAA 40* >60   < > >60 56* 33*  ANIONGAP 11 8   < > 14 13 11    < > = values in this interval not displayed.     Hematology Recent Labs  Lab 08/01/18 0644 08/02/18 0230 08/03/18 0325  WBC 12.3* 14.4* 30.0*  RBC 3.78* 3.87  3.30*  HGB 10.8* 11.3* 9.7*  HCT 35.2* 36.2 31.4*  MCV 93.1 93.5 95.2  MCH 28.6 29.2 29.4  MCHC 30.7 31.2 30.9  RDW 14.0 14.3 14.7  PLT 339 384 339    Cardiac EnzymesNo results for input(s): TROPONINI in the last 168 hours. No results for input(s): TROPIPOC in the last 168 hours.   BNPNo results for input(s): BNP, PROBNP in the last 168 hours.   DDimer No results for input(s): DDIMER in the last 168 hours.   Radiology    No results found.  Cardiac Studies    Patient Profile     73 y.o. female hx of CP, palpitations, mild AS    Now post op day 1 from GI surgery (s/p partial colectomy with lysis of adhesions)  Assessment & Plan    1Atrial fibrillation    Short bursts    May be due to increased adrenergic tone with surgery   Follow for now.   When in [place]    Could try low dose toprol (was on at home) when taking PO Follow for now    Watch I/O     2AS   Mild by echo     For questions or updates, please contact [facility] Please consult [email or web address] for contact info under        Signed, Dorris Carnes, MD  08/03/2018, 7:23 AM

## 2018-08-03 NOTE — Progress Notes (Signed)
CSW attempted to contact patient's sister  and niece to determine SNF placement. CSW left voice message to return call.    Thurmond Butts, [place] Social Worker [phone]

## 2018-08-03 NOTE — Care Management Important Message (Signed)
Important Message  Patient Details  Name: Sharon [name] MRN: [id number] Date of Birth: Sep 03, 1945   Medicare Important Message Given:  Yes    Osric Klopf P [place] 08/03/2018, 1:16 PM

## 2018-08-03 NOTE — Progress Notes (Signed)
Inpatient Diabetes Program Recommendations  AACE/ADA: New Consensus Statement on Inpatient Glycemic Control (2015)  Target Ranges:  Prepandial:   less than 140 mg/dL      Peak postprandial:   less than 180 mg/dL (1-2 hours)      Critically ill patients:  140 - 180 mg/dL   Lab Results  Component Value Date   GLUCAP 284 (H) 08/03/2018   HGBA1C 8.7 (H) 05/11/2018   Results for Sharon [name], Sharon [name] (MRN [id number]) as of 08/03/2018 09:54  Ref. Range 08/02/2018 18:25 08/02/2018 19:50 08/02/2018 23:16 08/03/2018 03:15 08/03/2018 08:05  Glucose-Capillary Latest Ref Range: 70 - 99 mg/dL 183 (H) 211 (H)  Novolog 3 units 246 (H)  Novolog 3 units 248 (H)  Novolog 3 units 284 (H)  Novolog 5 units    DM2  Home DM meds: Victoza 1.8 mg daily                             Novolog 10 - 30 units tid meal coverage                             Tresiba 80 units daily             Inpatient DM meds:  Novolog sensitive correction (0-9 units) Q4 hours                                  Lantus 35 units daily   CBG have been increasing post op from 211 - 284 mg/dl. Noted patient did not receive Lantus yesterday pre - op AND received dose of Decadron 5 mg during surgery which both contributed to higher CBG today.    -- Will follow during hospitalization.--  Jonna Clark RN, MSN Diabetes Coordinator Inpatient Glycemic Control Team Team Pager: [phone] (8am-5pm)

## 2018-08-03 NOTE — Progress Notes (Signed)
Physical Therapy Treatment Patient Details Name: Sharon [name] MRN: [id number] DOB: May 16, 1946 Today's Date: 08/03/2018    History of Present Illness 73 yo female with past medical history significant for chronic diastolic heart failure, type 2 diabetes insulin-dependent, non-hemorrhagic CVA, atrial fibrillation seen on loop recorder reportedly, who presented with abdominal pain.  CT abdomen pelvis revealed pneumatosis of the colon of unclear etiology.  [name] surgery and GI have been consulted.  She underwent flex sigmoidoscopy on 1/17 and 1/18 which revealed severe stricture in the sigmoid colon.  Patient underwent ex lap, lysis of adhesion, partial sigmoidectomy, and colostomy on 1/20    PT Comments    Patient seen for activity reassessment s/p sx. At this time patient demonstrates deficits in functional mobility as indicated below. Will benefit from continued skilled PT to address deficits and maximize function. Will see as indicated and progress as tolerated but will need ST SNF upon acute discharge given current functional limitations.    Follow Up Recommendations  SNF     Equipment Recommendations  None recommended by PT    Recommendations for Other Services       Precautions / Restrictions Precautions Precautions: Fall Restrictions Weight Bearing Restrictions: No    Mobility  Bed Mobility Overal bed mobility: Needs Assistance Bed Mobility: Rolling;Sidelying to Sit Rolling: Mod assist Sidelying to sit: Max assist;+2 for physical assistance       [name] bed mobility comments: Use of bed required to elevate trunk, significant pain during transition, patient able to reach across for rail but required +2 physical assist to complete trunk elevation and rotation to EOB  Transfers Overall transfer level: Needs assistance   Transfers: Sit to/from [employer] Transfers Sit to Stand: Max assist;+2 physical assistance Stand pivot transfers: Max assist;+2 physical  assistance       [name] transfer comment: Unable to reach full upright position due to pain, Max assist to transition   Ambulation/Gait             [name] Gait Details: unable to perform   Stairs             Wheelchair Mobility    Modified Rankin (Stroke Patients Only)       Balance     Sitting balance-Leahy Scale: Fair     Standing balance support: Bilateral upper extremity supported Standing balance-Leahy Scale: Poor                              Cognition Arousal/Alertness: Awake/alert Behavior During Therapy: Flat affect Overall Cognitive Status: Within Functional Limits for tasks assessed                                        Exercises      [name] Comments        Pertinent Vitals/Pain Pain Assessment: Faces Faces Pain Scale: Hurts whole lot Pain Location: abdomen Pain Descriptors / Indicators: Grimacing;Moaning Pain Intervention(s): Limited activity within patient's tolerance    Home Living                      Prior Function            PT Goals (current goals can now be found in the care plan section) Acute Rehab PT Goals Patient Stated Goal: to not hurt PT Goal Formulation: With patient Time For Goal Achievement:  08/17/18 Potential to Achieve Goals: Good Progress towards PT goals: (current goals remain )    Frequency    Min 2X/week      PT Plan Frequency needs to be updated    Co-evaluation              AM-PAC PT "6 Clicks" Mobility   Outcome Measure  Help needed turning from your back to your side while in a flat bed without using bedrails?: A Lot Help needed moving from lying on your back to sitting on the side of a flat bed without using bedrails?: A Lot Help needed moving to and from a bed to a chair (including a wheelchair)?: A Lot Help needed standing up from a chair using your arms (e.g., wheelchair or bedside chair)?: A Lot Help needed to walk in hospital room?:  A Lot Help needed climbing 3-5 steps with a railing? : Total 6 Click Score: 11    End of Session Equipment Utilized During Treatment: Oxygen Activity Tolerance: Treatment limited secondary to medical complications (Comment)(pain) Patient left: in chair;with call bell/phone within reach Nurse Communication: Mobility status;Other (comment) PT Visit Diagnosis: Difficulty in walking, not elsewhere classified (R26.2);Pain Pain - part of body: (abdomen )     Time: 8786-7672 PT Time Calculation (min) (ACUTE ONLY): 22 min  Charges:                        Alben Deeds, PT DPT  Board Certified Neurologic Specialist [place] Pager [phone] Office [place] 08/03/2018, 1:05 PM

## 2018-08-03 NOTE — Consult Note (Signed)
[facility] Nurse ostomy follow up Stoma type/location: LMQ at umbilicus in deep creasing. Noted in surgery extended time removing adhesions.   Stomal assessment/size: Oval, in crease.  4 cm wide and 1 cm length.  Ruddy red and friable.  Producing liquid brown stool. This will be a challenging pouching situation.  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  Today, she is nonparticipative and somnolent.  Peristomal assessment: stoma located in deep creasing.  Treatment options for stomal/peristomal  Today, I applied a flexible convex pouch but due to deep creasing, may switch to 1 piece flat with a barrier ring if this pouch leaks.  I have a belt at bedside but patient was too drowsy for application today.   Output liquid brown stool Ostomy pouching: 1pc.flexible convex.  Education provided: Explained that stoma was in a crease and we would be trying different pouches until the ideal system was identified for optimal comfort and wear time.  Enrolled patient in [place] Start Discharge program: No WOC team will follow and remain available to patient, medical and nursing teams.  Domenic Moras MSN, RN, FNP-BC CWON Wound, Ostomy, Continence Nurse Pager [phone]

## 2018-08-03 NOTE — Progress Notes (Signed)
Subjective Feeling better today than before surgery. Denies n/v. Sleepy. Denies n/v. Colostomy with stool and gas - leaked overnight and got on incision.  Objective: Vital signs in last 24 hours: Temp:  [97.2 F (36.2 C)-98.1 F (36.7 C)] 97.5 F (36.4 C) (01/21 0723) Pulse Rate:  [71-125] 76 (01/21 0723) Resp:  [13-24] 14 (01/21 0723) BP: (121-170)/(51-86) 158/82 (01/21 0700) SpO2:  [93 %-100 %] 97 % (01/21 0723) Weight:  [126.7 kg] 126.7 kg (01/21 0426) Last BM Date: 08/02/18  Intake/Output from previous day: 01/20 0701 - 01/21 0700 In: 2911.6 [P.O.:60; I.V.:2551.6; IV Piggyback:300] Out: 1260 [Urine:670; Drains:215; Stool:275; Blood:100] Intake/Output this shift: No intake/output data recorded.  Gen: NAD, comfortable CV: RRR Pulm: Normal work of breathing Abd: Soft, obese, nontender; not significantly distended; colostomy pink. JP with thin ss output Ext: SCDs in place  Lab Results: CBC  Recent Labs    08/02/18 0230 08/03/18 0325  WBC 14.4* 30.0*  HGB 11.3* 9.7*  HCT 36.2 31.4*  PLT 384 339   BMET Recent Labs    08/02/18 0230 08/03/18 0325  NA 147* 143  K 3.3* 3.8  CL 119* 119*  CO2 15* 13*  GLUCOSE 135* 284*  BUN 14 20  CREATININE 1.14* 1.77*  CALCIUM 9.2 9.0   PT/INR No results for input(s): LABPROT, INR in the last 72 hours. ABG No results for input(s): PHART, HCO3 in the last 72 hours.  Invalid input(s): PCO2, PO2  Studies/Results:  Anti-infectives: Anti-infectives (From admission, onward)   Start     Dose/Rate Route Frequency Ordered Stop   08/02/18 1300  piperacillin-tazobactam (ZOSYN) IVPB 3.375 g     3.375 g 100 mL/hr over 30 Minutes Intravenous To Surgery 08/02/18 1254 08/02/18 1358   07/27/18 0400  piperacillin-tazobactam (ZOSYN) IVPB 3.375 g  Status:  Discontinued     3.375 g 12.5 mL/hr over 240 Minutes Intravenous Every 8 hours 07/26/18 2203 08/02/18 1820   07/26/18 2130  piperacillin-tazobactam (ZOSYN) IVPB 3.375 g     3.375  g 100 mL/hr over 30 Minutes Intravenous  Once 07/26/18 2129 07/26/18 2218       Assessment/Plan: Patient Active Problem List   Diagnosis Date Noted  . Elevated LFTs 07/27/2018  . Aortic atherosclerosis ([place]) 07/27/2018  . 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 Cerebral thrombosis with cerebral infarction 05/11/2018  . CVA (cerebral vascular accident) ([place]) 05/11/2018  . Stroke-like symptoms 05/10/2018  . Hyperosmolar (nonketotic) coma ([place]) 04/15/2016  . Seizure ([place]) 04/15/2016  . AKI (acute kidney injury) ([place]) 04/15/2016  . Chronic diastolic heart failure ([place]) 04/15/2016  . Chronic diastolic CHF (congestive heart failure) ([place]) 06/04/2015  . Diverticulitis 05/28/2015  . Sigmoid diverticulitis 05/28/2015  . Hypokalemia 05/28/2015  . Diabetes mellitus type 2, controlled ([place]) 05/28/2015  . Chronic anemia 05/28/2015  . Diverticulitis of large intestine without perforation or abscess without bleeding   . Palpitation 04/12/2015  . Morbid obesity ([place]) 04/12/2015  . Mild aortic stenosis 04/12/2015  . Type 2 diabetes mellitus without complication ([place]) 93/57/0177  . Diabetes mellitus ([place]) 06/27/2014  . Hypertension 06/27/2014  . Chest pain 06/27/2014  . Asthma 06/27/2014   s/p Procedure(s): EXPLORATORY LAPAROTOMY LYSIS OF ADHESION PARTIAL COLECTOMYsigmoid end COLOSTOMY 08/02/2018  -WOCN consult -Glucose control -Would keep npo today given somnolence -PT/OT   LOS: 7 days   Sharon Mt. Dema Severin, M.D. [facility]

## 2018-08-04 ENCOUNTER — Inpatient Hospital Stay (HOSPITAL_COMMUNITY): Payer: Medicare Other

## 2018-08-04 ENCOUNTER — Inpatient Hospital Stay: Payer: Self-pay

## 2018-08-04 LAB — BASIC METABOLIC PANEL
ANION GAP: 6 (ref 5–15)
ANION GAP: 6 (ref 5–15)
BUN: 17 mg/dL (ref 8–23)
BUN: 13 mg/dL (ref 8–23)
CO2: 18 mmol/L — ABNORMAL LOW (ref 22–32)
CO2: 19 mmol/L — ABNORMAL LOW (ref 22–32)
Calcium: 8.9 mg/dL (ref 8.9–10.3)
Calcium: 8.9 mg/dL (ref 8.9–10.3)
Chloride: 121 mmol/L — ABNORMAL HIGH (ref 98–111)
Chloride: 121 mmol/L — ABNORMAL HIGH (ref 98–111)
Creatinine, Ser: 1.16 mg/dL — ABNORMAL HIGH (ref 0.44–1.00)
Creatinine, Ser: 1.08 mg/dL — ABNORMAL HIGH (ref 0.44–1.00)
GFR calc Af Amer: 55 mL/min — ABNORMAL LOW (ref >60)
GFR calc Af Amer: >60 mL/min (ref >60)
GFR calc non Af Amer: 48 mL/min — ABNORMAL LOW (ref >60)
GFR calc non Af Amer: 52 mL/min — ABNORMAL LOW (ref >60)
Glucose, Bld: 126 mg/dL — ABNORMAL HIGH (ref 70–99)
Glucose, Bld: 135 mg/dL — ABNORMAL HIGH (ref 70–99)
POTASSIUM: 3.5 mmol/L (ref 3.5–5.1)
Potassium: 2.7 mmol/L — CL (ref 3.5–5.1)
Sodium: 145 mmol/L (ref 135–145)
Sodium: 146 mmol/L — ABNORMAL HIGH (ref 135–145)

## 2018-08-04 LAB — MAGNESIUM: Magnesium: 2 mg/dL (ref 1.7–2.4)

## 2018-08-04 LAB — GLUCOSE, CAPILLARY
GLUCOSE-CAPILLARY: 103 mg/dL — AB (ref 70–99)
GLUCOSE-CAPILLARY: 109 mg/dL — AB (ref 70–99)
GLUCOSE-CAPILLARY: 120 mg/dL — AB (ref 70–99)
GLUCOSE-CAPILLARY: 133 mg/dL — AB (ref 70–99)
Glucose-Capillary: 130 mg/dL — ABNORMAL HIGH (ref 70–99)
Glucose-Capillary: 154 mg/dL — ABNORMAL HIGH (ref 70–99)

## 2018-08-04 LAB — CBC
HCT: 27.8 % — ABNORMAL LOW (ref 36.0–46.0)
Hemoglobin: 8.8 g/dL — ABNORMAL LOW (ref 12.0–15.0)
MCH: 29.7 pg (ref 26.0–34.0)
MCHC: 31.7 g/dL (ref 30.0–36.0)
MCV: 93.9 fL (ref 80.0–100.0)
NRBC: 0 % (ref 0.0–0.2)
Platelets: 244 10*3/uL (ref 150–400)
RBC: 2.96 MIL/uL — ABNORMAL LOW (ref 3.87–5.11)
RDW: 14.6 % (ref 11.5–15.5)
WBC: 14.5 10*3/uL — ABNORMAL HIGH (ref 4.0–10.5)

## 2018-08-04 LAB — PHOSPHORUS: Phosphorus: 2.7 mg/dL (ref 2.5–4.6)

## 2018-08-04 LAB — HEPARIN LEVEL (UNFRACTIONATED): Heparin Unfractionated: <0.1 IU/mL — ABNORMAL LOW (ref 0.30–0.70)

## 2018-08-04 MED ORDER — POTASSIUM CHLORIDE 10 MEQ/100ML IV SOLN
10.0000 meq | INTRAVENOUS | Status: AC
  Administered 2018-08-04 (×5): 10 meq via INTRAVENOUS
  Filled 2018-08-04 (×5): qty 100

## 2018-08-04 MED ORDER — INSULIN ASPART 100 UNIT/ML ~~LOC~~ SOLN
0.0000 [IU] | SUBCUTANEOUS | Status: DC
  Administered 2018-08-04: 3 [IU] via SUBCUTANEOUS
  Administered 2018-08-04: 2 [IU] via SUBCUTANEOUS
  Administered 2018-08-05 (×3): 3 [IU] via SUBCUTANEOUS
  Administered 2018-08-05: 5 [IU] via SUBCUTANEOUS

## 2018-08-04 MED ORDER — TRAVASOL 10 % IV SOLN
INTRAVENOUS | Status: AC
  Administered 2018-08-04: 19:00:00 via INTRAVENOUS
  Filled 2018-08-04: qty 403.2

## 2018-08-04 MED ORDER — POTASSIUM PHOSPHATES 15 MMOLE/5ML IV SOLN
15.0000 mmol | Freq: Once | INTRAVENOUS | Status: AC
  Administered 2018-08-04: 15 mmol via INTRAVENOUS
  Filled 2018-08-04: qty 5

## 2018-08-04 MED ORDER — OXYCODONE HCL 5 MG PO TABS: 5.0000 mg | ORAL_TABLET | ORAL | Status: DC | PRN

## 2018-08-04 MED ORDER — HYDROMORPHONE HCL 1 MG/ML IJ SOLN
1.0000 mg | INTRAMUSCULAR | Status: DC | PRN
  Administered 2018-08-04 – 2018-08-05 (×7): 1 mg via INTRAVENOUS
  Filled 2018-08-04 (×7): qty 1

## 2018-08-04 MED ORDER — FUROSEMIDE 10 MG/ML IJ SOLN
60.0000 mg | Freq: Once | INTRAMUSCULAR | Status: AC
  Administered 2018-08-04: 60 mg via INTRAVENOUS
  Filled 2018-08-04: qty 6

## 2018-08-04 MED ORDER — HEPARIN (PORCINE) 25000 UT/250ML-% IV SOLN
1850.0000 [IU]/h | INTRAVENOUS | Status: DC
  Administered 2018-08-04: 1400 [IU]/h via INTRAVENOUS
  Administered 2018-08-05: 1900 [IU]/h via INTRAVENOUS
  Administered 2018-08-05: 1600 [IU]/h via INTRAVENOUS
  Administered 2018-08-06 – 2018-08-08 (×4): 1850 [IU]/h via INTRAVENOUS
  Filled 2018-08-04 (×7): qty 250

## 2018-08-04 NOTE — Progress Notes (Signed)
ANTICOAGULATION CONSULT NOTE - Initial Consult  Pharmacy Consult for heparin Indication: atrial fibrillation  Allergies  Allergen Reactions  . Crestor [Rosuvastatin] Other (See Comments)    Myalgias  . Indomethacin Other (See Comments)    dizziness  . Invanz [Ertapenem Sodium] Hives  . Lipitor [Atorvastatin Calcium] Other (See Comments)    Myalgias   . Reglan [Metoclopramide] Other (See Comments)    TREMORS    Patient Measurements: Height: 4\' 11"  (149.9 cm) Weight: 287 lb 11.2 oz (130.5 kg) IBW/kg (Calculated) : 43.2 Heparin Dosing Weight:   Vital Signs: Temp: 98.4 F (36.9 C) (01/22 0804) Temp Source: Oral (01/22 0804) BP: 196/67 (01/22 0804) Pulse Rate: 76 (01/22 0804)  Labs: Recent Labs    08/02/18 0230 08/03/18 0325 08/04/18 0330  HGB 11.3* 9.7* 8.8*  HCT 36.2 31.4* 27.8*  PLT 384 339 244  CREATININE 1.14* 1.77* 1.16*    Estimated Creatinine Clearance: 55.6 mL/min (A) (by C-G formula based on SCr of 1.16 mg/dL (H)).   Medical History: Past Medical History:  Diagnosis Date  . Anxiety   . Asthma   . CHF (congestive heart failure) ([place])   . Depression   . Diabetes mellitus   . GERD (gastroesophageal reflux disease)   . Heart murmur    echo- 04/2016- mild aortic stenosis  . History of kidney stones    passed  . Hypercholesteremia   . Hypertension   . Kidney stones    "last kidney numbers normal"  . Left ear hearing loss   . Osteoarthritis   . Seizures ([place]) 04/2016   due to Hyperosmolar Nonketotic hyperglycemia- is what it was thought to be cause by. No further seizure activity.  . Thrombophlebitis    Assessment: 73 yo female with past medical history significant for chronic diastolic heart failure, type 2 diabetes insulin-dependent, non-hemorrhagic CVA, atrial fibrillation.  Patient enrolled in [place] study, last dose of apixaban vs placebo was noted to be 1/12 so if true medication it is cleared by now. Will plan on resuming oral AC once  cleared by surgery, for now will use IV heparin. With recent surgeries will aim for low goal and omit boluses.   Goal of Therapy:  Heparin level 0.3-0.5 units/ml Monitor platelets by anticoagulation protocol: Yes   Plan:  Start heparin infusion at 1400  units/hr Check anti-Xa level in 8 hours and daily while on heparin Continue to monitor H&H and platelets  Erin Hearing PharmD., BCPS Clinical Pharmacist 08/04/2018 11:27 AM

## 2018-08-04 NOTE — Progress Notes (Addendum)
Subjective Nausea overnight, no emesis as of yet. Much more awake beginning yesterday PM when I came back. Colostomy with stool and gas  Objective: Vital signs in last 24 hours: Temp:  [97.5 F (36.4 C)-98.4 F (36.9 C)] 98.4 F (36.9 C) (01/22 0804) Pulse Rate:  [72-114] 76 (01/22 0804) Resp:  [14-20] 20 (01/22 0804) BP: (146-196)/(57-67) 196/67 (01/22 0804) SpO2:  [93 %-98 %] 97 % (01/22 0804) Weight:  [130.5 kg] 130.5 kg (01/22 0300) Last BM Date: 08/03/18  Intake/Output from previous day: 01/21 0701 - 01/22 0700 In: 900.5 [I.V.:794.5; IV Piggyback:105.9] Out: 650 [Urine:470; Drains:130; Stool:50] Intake/Output this shift: No intake/output data recorded.  Gen: NAD, comfortable CV: RRR Pulm: Normal work of breathing Abd: Soft, obese, mild tenderness around midline incision; not significantly distended; colostomy pink with liquid stool in appliance. JP with thin ss output Ext: SCDs in place  Lab Results: CBC  Recent Labs    08/03/18 0325 08/04/18 0330  WBC 30.0* 14.5*  HGB 9.7* 8.8*  HCT 31.4* 27.8*  PLT 339 244   BMET Recent Labs    08/03/18 0325 08/04/18 0330  NA 143 145  K 3.8 2.7*  CL 119* 121*  CO2 13* 18*  GLUCOSE 284* 126*  BUN 20 17  CREATININE 1.77* 1.16*  CALCIUM 9.0 8.9   PT/INR No results for input(s): LABPROT, INR in the last 72 hours. ABG No results for input(s): PHART, HCO3 in the last 72 hours.  Invalid input(s): PCO2, PO2  Studies/Results:  Anti-infectives: Anti-infectives (From admission, onward)   Start     Dose/Rate Route Frequency Ordered Stop   08/02/18 1300  piperacillin-tazobactam (ZOSYN) IVPB 3.375 g     3.375 g 100 mL/hr over 30 Minutes Intravenous To Surgery 08/02/18 1254 08/02/18 1358   07/27/18 0400  piperacillin-tazobactam (ZOSYN) IVPB 3.375 g  Status:  Discontinued     3.375 g 12.5 mL/hr over 240 Minutes Intravenous Every 8 hours 07/26/18 2203 08/02/18 1820   07/26/18 2130  piperacillin-tazobactam (ZOSYN) IVPB  3.375 g     3.375 g 100 mL/hr over 30 Minutes Intravenous  Once 07/26/18 2129 07/26/18 2218       Assessment/Plan: Patient Active Problem List   Diagnosis Date Noted  . Elevated LFTs 07/27/2018  . Aortic atherosclerosis ([place]) 07/27/2018  . 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 Cerebral thrombosis with cerebral infarction 05/11/2018  . CVA (cerebral vascular accident) ([place]) 05/11/2018  . Stroke-like symptoms 05/10/2018  . Hyperosmolar (nonketotic) coma ([place]) 04/15/2016  . Seizure ([place]) 04/15/2016  . AKI (acute kidney injury) ([place]) 04/15/2016  . Chronic diastolic heart failure ([place]) 04/15/2016  . Chronic diastolic CHF (congestive heart failure) ([place]) 06/04/2015  . Diverticulitis 05/28/2015  . Sigmoid diverticulitis 05/28/2015  . Hypokalemia 05/28/2015  . Diabetes mellitus type 2, controlled ([place]) 05/28/2015  . Chronic anemia 05/28/2015  . Diverticulitis of large intestine without perforation or abscess without bleeding   . Palpitation 04/12/2015  . Morbid obesity ([place]) 04/12/2015  . Mild aortic stenosis 04/12/2015  . Type 2 diabetes mellitus without complication ([place]) 24/40/1027  . Diabetes mellitus ([place]) 06/27/2014  . Hypertension 06/27/2014  . Chest pain 06/27/2014  . Asthma 06/27/2014   s/p Procedure(s): EXPLORATORY LAPAROTOMY LYSIS OF ADHESION PARTIAL COLECTOMYsigmoid end COLOSTOMY 08/02/2018  -WOCN following -Glucose control -Continue NPO until nausea improved -Increased MIVF to 125/hr -Once ileus resolved, will work on thickening output to help with pouching -Given interval from PO and now nausea/inability to tolerate solids, would proceed with PICC/TPN -PT/OT -Ok for heparin gtt  without boluses for afib -Ppx: SCDs, Lov   LOS: 8 days   Sharon Mt. Dema Severin, M.D. [facility]

## 2018-08-04 NOTE — Progress Notes (Addendum)
Progress Note  Patient Name: Sharon [name] Date of Encounter: 08/04/2018  Primary Cardiologist: Candee Furbish, MD   Subjective   Nauseated    Breathing is OK   Inpatient Medications    Scheduled Meds: . acetaminophen  1,000 mg Oral Q6H  . enoxaparin (LOVENOX) injection  40 mg Subcutaneous Q24H  . insulin aspart  0-9 Units Subcutaneous Q4H  . insulin glargine  35 Units Subcutaneous Daily   Continuous Infusions: . sodium chloride 0 mL/hr at 07/30/18 0508  . lactated ringers 100 mL/hr at 08/04/18 0449  . methocarbamol (ROBAXIN) IV 500 mg (08/04/18 0533)  . potassium chloride 10 mEq (08/04/18 0737)   PRN Meds: sodium chloride, hydrALAZINE, metoprolol tartrate, morphine injection, ondansetron **OR** ondansetron (ZOFRAN) IV, promethazine   Vital Signs    Vitals:   08/03/18 2003 08/03/18 2300 08/04/18 0300 08/04/18 0804  BP: (!) 167/59 (!) 158/60 (!) 156/66 (!) 196/67  Pulse: 72 74 78 76  Resp: 20 19 14 20   Temp: 98.1 F (36.7 C) 98 F (36.7 C) (!) 97.5 F (36.4 C) 98.4 F (36.9 C)  TempSrc: Oral Oral Oral Oral  SpO2: 96% 97% 93% 97%  Weight:   130.5 kg   Height:        Intake/Output Summary (Last 24 hours) at 08/04/2018 0814 Last data filed at 08/04/2018 0600 Gross per 24 hour  Intake 589.09 ml  Output 650 ml  Net -60.91 ml   I/O   +14L  Last 3 Weights 08/04/2018 08/03/2018 08/02/2018  Weight (lbs) 287 lb 11.2 oz 279 lb 5.2 oz 275 lb 2.2 oz  Weight (kg) 130.5 kg 126.7 kg 124.8 kg      Telemetry     SR   Short burst of PAT   1 16 beat run NSVT   - Personally Reviewed  ECG      Physical Exam   GEN: appears uncomfortable   Neck: Neck is full  Cardiac: RRR, II/VI systolic murmur at base   No  rubs, or gallops.  Respiratory: Clear to auscul anteirorly  GI: Deferred  MS: 1+ edema hands and feet/legs   Labs    Chemistry Recent Labs  Lab 07/29/18 0235  08/02/18 0230 08/03/18 0325 08/04/18 0330  NA 144   < > 147* 143 145  K 3.2*   < > 3.3* 3.8 2.7*    CL 116*   < > 119* 119* 121*  CO2 20*   < > 15* 13* 18*  GLUCOSE 174*   < > 135* 284* 126*  BUN 15   < > 14 20 17   CREATININE 0.97   < > 1.14* 1.77* 1.16*  CALCIUM 8.6*   < > 9.2 9.0 8.9  PROT 5.9*  --   --   --   --   ALBUMIN 2.4*  --   --   --   --   AST 41  --   --   --   --   ALT 59*  --   --   --   --   ALKPHOS 139*  --   --   --   --   BILITOT 0.4  --   --   --   --   GFRNONAA 59*   < > 49* 29* 48*  GFRAA >60   < > 56* 33* 55*  ANIONGAP 8   < > 13 11 6    < > = values in this interval not displayed.  Hematology Recent Labs  Lab 08/02/18 0230 08/03/18 0325 08/04/18 0330  WBC 14.4* 30.0* 14.5*  RBC 3.87 3.30* 2.96*  HGB 11.3* 9.7* 8.8*  HCT 36.2 31.4* 27.8*  MCV 93.5 95.2 93.9  MCH 29.2 29.4 29.7  MCHC 31.2 30.9 31.7  RDW 14.3 14.7 14.6  PLT 384 339 244    Cardiac EnzymesNo results for input(s): TROPONINI in the last 168 hours. No results for input(s): TROPIPOC in the last 168 hours.   BNPNo results for input(s): BNP, PROBNP in the last 168 hours.   DDimer No results for input(s): DDIMER in the last 168 hours.   Radiology    No results found.  Cardiac Studies    Patient Profile     73 y.o. female hx of CP, palpitations, mild AS    Now post op day 1 from GI surgery (s/p partial colectomy with lysis of adhesions)  Assessment & Plan    1Rhythm   Pt with only a short burst of PAT    Had one burst of NSVT ON heparin now    Follow on tele Afib happened in immed periop period Less than 24 hours   If does not recu could keep on tele and stop heparin    2AS   Mild by echo     Pt with signif 3rd spacing   Would like to give lasix   Will check BMET   Last Cr had bumped    For questions or updates, please contact [place] Please consult [email or web address] for contact info under        Signed, Dorris Carnes, MD  08/04/2018, 8:14 AM

## 2018-08-04 NOTE — Progress Notes (Addendum)
PHARMACY - ADULT TOTAL PARENTERAL NUTRITION CONSULT NOTE   Pharmacy Consult:  TPN Indication:  Colonic stricture with partial obstruction  Patient Measurements: Height: 4\' 11"  (149.9 cm) Weight: 287 lb 11.2 oz (130.5 kg) IBW/kg (Calculated) : 43.2 TPN AdjBW (KG): 61.1 Body mass index is 58.11 kg/m.  Assessment:  14 YOF presented on 07/26/18 with abdominal pain, nausea and vomiting, and found to have pneumatosis.  Sigmoidoscopy on 07/30/18 showed colonic stricture and on 07/31/18 showed diverticulosis.  Patient underwent ex-lap with LoA, partial sigmoidectomy and end colostomy on 08/02/18.  Pharmacy consulted to initiate TPN given inadequate nutrition since admit.   GI: hx GERD.  Drain O/P 132mL, stool O/P 134mL Endo: DM on Tresiba, Novolog and Victoza PTA.  CBGs trending down on SSI + Lantus 35/d (dose not given this AM d/t NPO status and low normal CBG) Insulin requirements in the past 24 hours: 15 units Lytes: K 2.7 (5 runs given), high CL and low CO2, CoCa 10.18.  Mag was 2.1 on 1/19 and Phos was 1.9 on 1/17 and not supplemented Renal: SCr 1.16, BUN WNL - UOP 0.2 ml/kg/hr, LR at 125 ml/hr Pulm: stable on RA Cards: CHF/Afib/HLD - BP elevated, HR controlled AC: Heparin for Afib - hgb 8.8, plts WNL Hepatobil: LFTs were improving and tbili normalized as of 1/16 Neuro: CVA/depression/seizure - APAP, Robaxin ID: afebrile, WBC 14.5 - not on abx TPN Access: CVC double lumen placed 08/02/18 TPN start date: 08/04/18  Nutritional Goals (RD rec pending): 1450-1600 kCal and 90-105gm protein per day  Current Nutrition:  NPO  Plan:   Initiate TPN at 30 ml/hr (goal rate 70 ml/hr) TPN will provide 40g AA, 86g CHO and 19g ILE for a total of 646 kCal, meeting ~40% of patient's needs Electrolytes in TPN: standard except increased K, max acetate Daily multivitamin and trace elements in TPN Change SSI to moderate Q4H.  D/C Lantus for now and add 10 units regular insulin in TPN. Standard TPN labs and  nursing care orders. F/U CBGs and lytes to advance TPN rate  Given cut off time for TPN orders, will have to check Mag and Phos and supplement outside of TPN if needed this PM.   Sharon [name] D. Mina Marble, PharmD, BCPS, [place] 08/04/2018, 11:58 AM   ====================  Addendum: Mag WNL and Phos low normal KPhos 15 mmol IV x 1   Sharon [name] D. Mina Marble, PharmD, BCPS, [place] 08/04/2018, 1:08 PM

## 2018-08-04 NOTE — Progress Notes (Signed)
Sharon [name] for heparin Indication: atrial fibrillation  Allergies  Allergen Reactions  . Crestor [Rosuvastatin] Other (See Comments)    Myalgias  . Indomethacin Other (See Comments)    dizziness  . Invanz [Ertapenem Sodium] Hives  . Lipitor [Atorvastatin Calcium] Other (See Comments)    Myalgias   . Reglan [Metoclopramide] Other (See Comments)    TREMORS    Patient Measurements: Height: 4\' 11"  (149.9 cm) Weight: 287 lb 11.2 oz (130.5 kg) IBW/kg (Calculated) : 43.2 Heparin Dosing Weight:   Vital Signs: Temp: 98.5 F (36.9 C) (01/22 2012) Temp Source: Oral (01/22 2012) BP: 169/65 (01/22 2000) Pulse Rate: 82 (01/22 2012)  Labs: Recent Labs    08/02/18 0230 08/03/18 0325 08/04/18 0330 08/04/18 1538 08/04/18 2000  HGB 11.3* 9.7* 8.8*  --   --   HCT 36.2 31.4* 27.8*  --   --   PLT 384 339 244  --   --   HEPARINUNFRC  --   --   --   --  <0.10*  CREATININE 1.14* 1.77* 1.16* 1.08*  --     Estimated Creatinine Clearance: 59.8 mL/min (A) (by C-G formula based on SCr of 1.08 mg/dL (H)).   Medical History: Past Medical History:  Diagnosis Date  . Anxiety   . Asthma   . CHF (congestive heart failure) ([place])   . Depression   . Diabetes mellitus   . GERD (gastroesophageal reflux disease)   . Heart murmur    echo- 04/2016- mild aortic stenosis  . History of kidney stones    passed  . Hypercholesteremia   . Hypertension   . Kidney stones    "last kidney numbers normal"  . Left ear hearing loss   . Osteoarthritis   . Seizures ([place]) 04/2016   due to Hyperosmolar Nonketotic hyperglycemia- is what it was thought to be cause by. No further seizure activity.  . Thrombophlebitis    Assessment: 73 yo female with past medical history significant for chronic diastolic heart failure, type 2 diabetes insulin-dependent, non-hemorrhagic CVA, atrial fibrillation.  Patient enrolled in [place] study, last dose of apixaban vs placebo was  noted to be 1/12 so if true medication it is cleared by now. Will plan on resuming oral AC once cleared by surgery, for now will use IV heparin. With recent surgeries will aim for low goal and omit boluses.   Initial heparin level undetectable.  Per RN, no known issues with IV infusion.  No overt bleeding or complications noted.  Goal of Therapy:  Heparin level 0.3-0.5 units/ml Monitor platelets by anticoagulation protocol: Yes   Plan:  Increase IV heparin to 1600 units/hr Recheck heparin level in 8 hrs Daily heparin level and CBC.  Marguerite Olea, [facility] Clinical Pharmacist Phone [phone]  08/04/2018 9:53 PM

## 2018-08-04 NOTE — Consult Note (Signed)
[place] Nurse ostomy follow up Stoma type/location: LMQ colostomy Stomal assessment/size: Oval in crease, 4 cm wide and 0.5 cm in length, smaller today.   Peristomal assessment: intact  Treatment options for stomal/peristomal skin: barrier ring and switching to 1piece flat for maximum flexibility.   Output soft brown stool, has leaked into umbilicus at staple line. Ostomy pouching: 1pc.flat with barrier ring  Education provided: None-patient feels bad and declines teaching.  Made aware again of leaking due to stoma located in crease and that we are troubleshooting pouching systems to find an ideal pouch.  [place] team will follow and remain available to patient, medical and nursing teams.  Domenic Moras MSN, RN, FNP-BC CWON Wound, Ostomy, Continence Nurse Pager [phone]  Enrolled patient in [facility] Discharge program: No

## 2018-08-04 NOTE — Progress Notes (Signed)
CRITICAL VALUE ALERT  Critical Value:  Potassium: 2.7  Date & Time Notied:  08/04/2018  Provider Notified: Lamar Blinks, NP  Orders Received/Actions taken:  Awaiting new orders, will continue to monitor.

## 2018-08-04 NOTE — Progress Notes (Signed)
PROGRESS NOTE    Sharon [name]  SWN:[id number] DOB: 1946-02-23 DOA: 07/26/2018 PCP: [name], MD     Brief Narrative:  Sharon [name] is a 73 yo female with past medical history significant for chronic diastolic heart failure, type 2 diabetes insulin-dependent, non-hemorrhagic CVA, atrial fibrillation seen on loop recorder reportedly, who presented with abdominal pain.  CT abdomen pelvis revealed pneumatosis of the colon of unclear etiology.  General surgery and GI have been consulted.  She underwent flex sigmoidoscopy on 1/17 and 1/18 which revealed severe stricture in the sigmoid colon.  General surgery was reconsulted. Patient underwent ex lap, lysis of adhesion, partial sigmoidectomy, and colostomy on 1/20.   New events last 24 hours / Subjective: Complains of nausea without vomiting. Continues to have abdominal pain.   Assessment & Plan:   Principal Problem:   Pneumatosis intestinalis Active Problems:   Type 2 diabetes mellitus without complication (HCC)   Hypokalemia   Diabetes mellitus type 2, controlled (HCC)   Chronic diastolic CHF (congestive heart failure) (HCC)   AKI (acute kidney injury) ([place])   CVA (cerebral vascular accident) ([place])   Elevated LFTs   Aortic atherosclerosis (HCC)   Severe colonic stricture -CTabdomen and pelvis showed pneumatosis involving the cecum, ascending colon and hepatic flexure of the colon -AXR 1/15 with persistent colonic obstruction concerning for distal neoplasm -AXR 1/16 Marked gaseous distention of the colon -Repeat CT abdomen pelvis revealed near complete resolution of pneumatosis, no free air, no evidence of bowel obstruction, suspected adynamic colonic ileus -Underwent flex sigmoidoscopy 1/17, technically difficult procedure due to poor prep.  Subsequently underwent gastrografin enema -Underwent flex sigmoidoscopy 1/18 which revealed stricture in the sigmoid colon -S/p ex lap, lysis of adhesion, partial sigmoidectomy, and  colostomy 1/20 -Per general surgery  Acute kidney injury -Baseline creatinine 0.9 -Improving with IVF, monitor BMP   Insulin-dependent diabetes type 2 -Continue Lantus, sliding scale insulin.  Blood sugar within acceptable control.   Chronic diastolic heart failure -Euvolemic currently continue to monitor while on IVF   History of non-hemorrhagic CVA Paroxysmal A Fib -Continue Eliquis when cleared by surgical team  -Follows with Dr. Marlou Porch as outpatient   Morbid obesity -Body mass index is 58.11 kg/m.  Hypokalemia -Replace, trend    DVT prophylaxis: Eliquis when able, currently on Lovenox Code Status: Full code Family Communication: No family at bedside  Disposition Plan: Pending post-op course. SNF planned when clinically improved for discharge    Consultants:   GI  General surgery  Cardiology   Procedures:   Flex sigmoidoscopy 1/17  Flex sigmoidoscopy 1/18  S/p ex lap, lysis of adhesion, partial sigmoidectomy, and colostomy 1/20  Antimicrobials:  Anti-infectives (From admission, onward)   Start     Dose/Rate Route Frequency Ordered Stop   08/02/18 1300  piperacillin-tazobactam (ZOSYN) IVPB 3.375 g     3.375 g 100 mL/hr over 30 Minutes Intravenous To Surgery 08/02/18 1254 08/02/18 1358   07/27/18 0400  piperacillin-tazobactam (ZOSYN) IVPB 3.375 g  Status:  Discontinued     3.375 g 12.5 mL/hr over 240 Minutes Intravenous Every 8 hours 07/26/18 2203 08/02/18 1820   07/26/18 2130  piperacillin-tazobactam (ZOSYN) IVPB 3.375 g     3.375 g 100 mL/hr over 30 Minutes Intravenous  Once 07/26/18 2129 07/26/18 2218       Objective: Vitals:   08/03/18 2003 08/03/18 2300 08/04/18 0300 08/04/18 0804  BP: (!) 167/59 (!) 158/60 (!) 156/66 (!) 196/67  Pulse: 72 74 78 76  Resp: 20 19 14  20  Temp: 98.1 F (36.7 C) 98 F (36.7 C) (!) 97.5 F (36.4 C) 98.4 F (36.9 C)  TempSrc: Oral Oral Oral Oral  SpO2: 96% 97% 93% 97%  Weight:   130.5 kg   Height:         Intake/Output Summary (Last 24 hours) at 08/04/2018 0829 Last data filed at 08/04/2018 0600 Gross per 24 hour  Intake 589.09 ml  Output 650 ml  Net -60.91 ml   Filed Weights   08/02/18 0459 08/03/18 0426 08/04/18 0300  Weight: 124.8 kg 126.7 kg 130.5 kg    Examination: General exam: Appears calm and but uncomfortable  Respiratory system: Clear to auscultation. Respiratory effort normal. Cardiovascular system: S1 & S2 heard, RRR. No JVD, murmurs, rubs, gallops or clicks. No pedal edema. Gastrointestinal system: Abdomen is nondistended, soft and TTP. Dressing in tact and dry and clean, colostomy present  Central nervous system: Alert and oriented. No focal neurological deficits. Extremities: Symmetric 5 x 5 power. Skin: No rashes, lesions or ulcers Psychiatry: Judgement and insight appear normal. Mood & affect appropriate.    Data Reviewed: I have personally reviewed following labs and imaging studies  CBC: Recent Labs  Lab 07/30/18 0613 08/01/18 0644 08/02/18 0230 08/03/18 0325 08/04/18 0330  WBC 9.1 12.3* 14.4* 30.0* 14.5*  HGB 10.3* 10.8* 11.3* 9.7* 8.8*  HCT 32.9* 35.2* 36.2 31.4* 27.8*  MCV 91.6 93.1 93.5 95.2 93.9  PLT 323 339 384 339 174   Basic Metabolic Panel: Recent Labs  Lab 07/30/18 0613 07/31/18 0250 08/01/18 0244 08/02/18 0230 08/03/18 0325 08/04/18 0330  NA 143 144 146* 147* 143 145  K 3.2* 4.0 3.8 3.3* 3.8 2.7*  CL 116* 116* 119* 119* 119* 121*  CO2 18* 17* 13* 15* 13* 18*  GLUCOSE 97 111* 109* 135* 284* 126*  BUN 7* 8 10 14 20 17   CREATININE 0.90 0.91 1.03* 1.14* 1.77* 1.16*  CALCIUM 8.6* 8.9 8.8* 9.2 9.0 8.9  MG 2.0  --  2.1  --   --   --   PHOS 1.9*  --   --   --   --   --    GFR: Estimated Creatinine Clearance: 55.6 mL/min (A) (by C-G formula based on SCr of 1.16 mg/dL (H)). Liver Function Tests: Recent Labs  Lab 07/29/18 0235  AST 41  ALT 59*  ALKPHOS 139*  BILITOT 0.4  PROT 5.9*  ALBUMIN 2.4*   No results for input(s):  LIPASE, AMYLASE in the last 168 hours. No results for input(s): AMMONIA in the last 168 hours. Coagulation Profile: No results for input(s): INR, PROTIME in the last 168 hours. Cardiac Enzymes: No results for input(s): CKTOTAL, CKMB, CKMBINDEX, TROPONINI in the last 168 hours. BNP (last 3 results) No results for input(s): PROBNP in the last 8760 hours. HbA1C: No results for input(s): HGBA1C in the last 72 hours. CBG: Recent Labs  Lab 08/03/18 1140 08/03/18 1613 08/03/18 1959 08/04/18 0013 08/04/18 0425  GLUCAP 206* 200* 153* 133* 120*   Lipid Profile: No results for input(s): CHOL, HDL, LDLCALC, TRIG, CHOLHDL, LDLDIRECT in the last 72 hours. Thyroid Function Tests: No results for input(s): TSH, T4TOTAL, FREET4, T3FREE, THYROIDAB in the last 72 hours. Anemia Panel: No results for input(s): VITAMINB12, FOLATE, FERRITIN, TIBC, IRON, RETICCTPCT in the last 72 hours. Sepsis Labs: No results for input(s): PROCALCITON, LATICACIDVEN in the last 168 hours.  Recent Results (from the past 240 hour(s))  Culture, Urine     Status: None  Collection Time: 07/27/18  4:51 PM  Result Value Ref Range Status   Specimen Description URINE, CATHETERIZED  Final   Special Requests NONE  Final   Culture   Final    NO GROWTH Performed at [facility], [address], [place], [place] [address]    Report Status 07/28/2018 FINAL  Final  MRSA PCR Screening     Status: None   Collection Time: 08/02/18  6:20 PM  Result Value Ref Range Status   MRSA by PCR NEGATIVE NEGATIVE Final    Comment:        The GeneXpert MRSA Assay (FDA approved for NASAL specimens only), is one component of a comprehensive MRSA colonization surveillance program. It is not intended to diagnose MRSA infection nor to guide or monitor treatment for MRSA infections. Performed at [facility], [place] [address] [place], [place] [address]        Radiology Studies: No results found.    Scheduled Meds: .  acetaminophen  1,000 mg Oral Q6H  . enoxaparin (LOVENOX) injection  40 mg Subcutaneous Q24H  . insulin aspart  0-9 Units Subcutaneous Q4H  . insulin glargine  35 Units Subcutaneous Daily   Continuous Infusions: . sodium chloride 0 mL/hr at 07/30/18 0508  . lactated ringers 100 mL/hr at 08/04/18 0449  . methocarbamol (ROBAXIN) IV 500 mg (08/04/18 0533)  . potassium chloride 10 mEq (08/04/18 0737)     LOS: 8 days    Time spent: 20 minutes   Dessa Phi, DO [facility] [email or web address] 08/04/2018, 8:29 AM

## 2018-08-05 DIAGNOSIS — I700 Atherosclerosis of aorta: Secondary | ICD-10-CM

## 2018-08-05 DIAGNOSIS — R140 Abdominal distension (gaseous): Secondary | ICD-10-CM

## 2018-08-05 DIAGNOSIS — Z515 Encounter for palliative care: Secondary | ICD-10-CM

## 2018-08-05 DIAGNOSIS — Z7189 Other specified counseling: Secondary | ICD-10-CM

## 2018-08-05 LAB — CBC
HCT: 30.5 % — ABNORMAL LOW (ref 36.0–46.0)
Hemoglobin: 9.5 g/dL — ABNORMAL LOW (ref 12.0–15.0)
MCH: 29.2 pg (ref 26.0–34.0)
MCHC: 31.1 g/dL (ref 30.0–36.0)
MCV: 93.8 fL (ref 80.0–100.0)
Platelets: 292 10*3/uL (ref 150–400)
RBC: 3.25 MIL/uL — ABNORMAL LOW (ref 3.87–5.11)
RDW: 15 % (ref 11.5–15.5)
WBC: 16.4 10*3/uL — ABNORMAL HIGH (ref 4.0–10.5)
nRBC: 0 % (ref 0.0–0.2)

## 2018-08-05 LAB — DIFFERENTIAL
Abs Immature Granulocytes: 0.14 10*3/uL — ABNORMAL HIGH (ref 0.00–0.07)
Basophils Absolute: 0.1 10*3/uL (ref 0.0–0.1)
Basophils Relative: 0 %
Eosinophils Absolute: 0.8 10*3/uL — ABNORMAL HIGH (ref 0.0–0.5)
Eosinophils Relative: 5 %
Immature Granulocytes: 1 %
LYMPHS PCT: 7 %
Lymphs Abs: 1.1 10*3/uL (ref 0.7–4.0)
Monocytes Absolute: 0.7 10*3/uL (ref 0.1–1.0)
Monocytes Relative: 4 %
Neutro Abs: 13.6 10*3/uL — ABNORMAL HIGH (ref 1.7–7.7)
Neutrophils Relative %: 83 %

## 2018-08-05 LAB — COMPREHENSIVE METABOLIC PANEL
ALT: 14 U/L (ref 0–44)
AST: 10 U/L — ABNORMAL LOW (ref 15–41)
Albumin: 2.1 g/dL — ABNORMAL LOW (ref 3.5–5.0)
Alkaline Phosphatase: 71 U/L (ref 38–126)
Anion gap: 7 (ref 5–15)
BUN: 15 mg/dL (ref 8–23)
CALCIUM: 8.9 mg/dL (ref 8.9–10.3)
CO2: 19 mmol/L — ABNORMAL LOW (ref 22–32)
Chloride: 118 mmol/L — ABNORMAL HIGH (ref 98–111)
Creatinine, Ser: 1.06 mg/dL — ABNORMAL HIGH (ref 0.44–1.00)
GFR calc Af Amer: >60 mL/min (ref >60)
GFR, EST NON AFRICAN AMERICAN: 53 mL/min — AB (ref >60)
Glucose, Bld: 202 mg/dL — ABNORMAL HIGH (ref 70–99)
Potassium: 3.2 mmol/L — ABNORMAL LOW (ref 3.5–5.1)
Sodium: 144 mmol/L (ref 135–145)
Total Bilirubin: 0.4 mg/dL (ref 0.3–1.2)
Total Protein: 5.3 g/dL — ABNORMAL LOW (ref 6.5–8.1)

## 2018-08-05 LAB — BASIC METABOLIC PANEL WITH GFR
Anion gap: 9 (ref 5–15)
BUN: 17 mg/dL (ref 8–23)
CO2: 18 mmol/L — ABNORMAL LOW (ref 22–32)
Calcium: 8.8 mg/dL — ABNORMAL LOW (ref 8.9–10.3)
Chloride: 115 mmol/L — ABNORMAL HIGH (ref 98–111)
Creatinine, Ser: 1.22 mg/dL — ABNORMAL HIGH (ref 0.44–1.00)
GFR calc Af Amer: 52 mL/min — ABNORMAL LOW (ref >60)
GFR calc non Af Amer: 45 mL/min — ABNORMAL LOW (ref >60)
Glucose, Bld: 349 mg/dL — ABNORMAL HIGH (ref 70–99)
Potassium: 3.7 mmol/L (ref 3.5–5.1)
Sodium: 142 mmol/L (ref 135–145)

## 2018-08-05 LAB — GLUCOSE, CAPILLARY
Glucose-Capillary: 159 mg/dL — ABNORMAL HIGH (ref 70–99)
Glucose-Capillary: 177 mg/dL — ABNORMAL HIGH (ref 70–99)
Glucose-Capillary: 192 mg/dL — ABNORMAL HIGH (ref 70–99)
Glucose-Capillary: 245 mg/dL — ABNORMAL HIGH (ref 70–99)

## 2018-08-05 LAB — PHOSPHORUS: Phosphorus: 4.2 mg/dL (ref 2.5–4.6)

## 2018-08-05 LAB — HEPARIN LEVEL (UNFRACTIONATED)
Heparin Unfractionated: 0.15 IU/mL — ABNORMAL LOW (ref 0.30–0.70)
Heparin Unfractionated: 0.52 IU/mL (ref 0.30–0.70)

## 2018-08-05 LAB — TRIGLYCERIDES: Triglycerides: 158 mg/dL — ABNORMAL HIGH (ref <150)

## 2018-08-05 LAB — PREALBUMIN: Prealbumin: 5.6 mg/dL — ABNORMAL LOW (ref 18–38)

## 2018-08-05 LAB — MAGNESIUM: Magnesium: 1.8 mg/dL (ref 1.7–2.4)

## 2018-08-05 MED ORDER — POTASSIUM CHLORIDE 10 MEQ/50ML IV SOLN
10.0000 meq | INTRAVENOUS | Status: AC
  Administered 2018-08-05 (×5): 10 meq via INTRAVENOUS
  Filled 2018-08-05 (×5): qty 50

## 2018-08-05 MED ORDER — SODIUM CHLORIDE 0.9% FLUSH
10.0000 mL | Freq: Two times a day (BID) | INTRAVENOUS | Status: DC
  Administered 2018-08-05 – 2018-08-10 (×7): 10 mL

## 2018-08-05 MED ORDER — FUROSEMIDE 10 MG/ML IJ SOLN
40.0000 mg | Freq: Once | INTRAMUSCULAR | Status: AC
  Administered 2018-08-05: 40 mg via INTRAVENOUS
  Filled 2018-08-05: qty 4

## 2018-08-05 MED ORDER — NALOXONE HCL 0.4 MG/ML IJ SOLN: 0.4000 mg | INTRAMUSCULAR | Status: DC | PRN

## 2018-08-05 MED ORDER — DIPHENHYDRAMINE HCL 12.5 MG/5ML PO ELIX
12.5000 mg | ORAL_SOLUTION | Freq: Four times a day (QID) | ORAL | Status: DC | PRN
  Filled 2018-08-05: qty 5

## 2018-08-05 MED ORDER — MAGNESIUM SULFATE IN D5W 1-5 GM/100ML-% IV SOLN
1.0000 g | Freq: Once | INTRAVENOUS | Status: AC
  Administered 2018-08-05: 1 g via INTRAVENOUS
  Filled 2018-08-05: qty 100

## 2018-08-05 MED ORDER — DIPHENHYDRAMINE HCL 50 MG/ML IJ SOLN: 12.5000 mg | Freq: Four times a day (QID) | INTRAMUSCULAR | Status: DC | PRN

## 2018-08-05 MED ORDER — GABAPENTIN 300 MG PO CAPS
300.0000 mg | ORAL_CAPSULE | Freq: Three times a day (TID) | ORAL | Status: DC
  Filled 2018-08-05 (×4): qty 1

## 2018-08-05 MED ORDER — PROCHLORPERAZINE EDISYLATE 10 MG/2ML IJ SOLN
10.0000 mg | Freq: Three times a day (TID) | INTRAMUSCULAR | Status: DC | PRN
  Administered 2018-08-05: 10 mg via INTRAVENOUS
  Filled 2018-08-05 (×3): qty 2

## 2018-08-05 MED ORDER — HYDROMORPHONE 1 MG/ML IV SOLN
INTRAVENOUS | Status: DC
  Administered 2018-08-05: 0.915 mg via INTRAVENOUS
  Administered 2018-08-05: 1.98 mg via INTRAVENOUS
  Administered 2018-08-05: 18:00:00 via INTRAVENOUS
  Administered 2018-08-06: 0.19 mg via INTRAVENOUS
  Filled 2018-08-05: qty 25

## 2018-08-05 MED ORDER — TRAVASOL 10 % IV SOLN
INTRAVENOUS | Status: DC
  Administered 2018-08-05: 19:00:00 via INTRAVENOUS
  Filled 2018-08-05: qty 604.8

## 2018-08-05 MED ORDER — ONDANSETRON HCL 4 MG/2ML IJ SOLN
4.0000 mg | Freq: Four times a day (QID) | INTRAMUSCULAR | Status: DC | PRN
  Administered 2018-08-05 – 2018-08-07 (×5): 4 mg via INTRAVENOUS
  Filled 2018-08-05 (×5): qty 2

## 2018-08-05 MED ORDER — SODIUM CHLORIDE 0.9% FLUSH: 10.0000 mL | INTRAVENOUS | Status: DC | PRN

## 2018-08-05 MED ORDER — SODIUM CHLORIDE 0.9% FLUSH: 9.0000 mL | INTRAVENOUS | Status: DC | PRN

## 2018-08-05 MED ORDER — PROMETHAZINE HCL 25 MG/ML IJ SOLN
12.5000 mg | INTRAMUSCULAR | Status: DC | PRN
  Administered 2018-08-05 – 2018-08-08 (×7): 12.5 mg via INTRAVENOUS
  Filled 2018-08-05 (×6): qty 1

## 2018-08-05 MED ORDER — ONDANSETRON HCL 4 MG/2ML IJ SOLN: 4.0000 mg | Freq: Four times a day (QID) | INTRAMUSCULAR | Status: DC | PRN

## 2018-08-05 NOTE — Progress Notes (Signed)
Physical Therapy Discharge Patient Details Name: Sharon [name] MRN: [id number] DOB: [birth year] Today's Date: 08/05/2018 Time:  -     Patient discharged from PT services secondary to Pt has made the decision to move to comfort care and hospice. Earlier when I attempted PT she had said she declined..  Please see latest therapy progress note for current level of functioning and progress toward goals.    Progress and discharge plan discussed with patient and/or caregiver: Patient/Caregiver agrees with plan  GP     [place] 08/05/2018, 2:00 PM   Borden Thune [place] Pager [phone] Office [phone]

## 2018-08-05 NOTE — Progress Notes (Addendum)
Pt refusing CBG sticks at this time stating "no more, I'm done"

## 2018-08-05 NOTE — Progress Notes (Signed)
PROGRESS NOTE    Taneya Conkel  XMI:[id number] DOB: 1945/09/07 DOA: 07/26/2018 PCP: [name], MD      Brief Narrative:  Mrs. Chaloux is a 73 y.o. F with dCHF, IDDM, hx CVA, Afib not on AC and obesity who presented 1/13 with LLQ abdominal pain.  CT in the ER showed pneumatosis intestinalis in the right colon.  Was evaluated by General Surgery who recommended fluids and IV antibiotics and initially conservative management.  GI consulted as well.  AXR 1/15 and 1/16 showed persistent colonic dilatation; repeat CT abdomen 1/16 showed near complete resolution of pneumatosis but multiple loos gas-filled dilated colon, suggesting adynamic ileus.  1/17 underwent Flex sig, non-diagnostic due to poor prep.  1/18 underwent repeat flex sig after enema prep that showed a sigmoid stricture, could not pass scope.  Also seen on gastrograffin enema.  Post-sigmoidoscopy, she had persistent severe, unrelenting abdominal pain, bloating, nausea.    General surgery evaluated and recommended exploratory laparotomy, which was performed on 1/20.  Intraoperatively, she was noted to have stricture.  LOA was performed, sigmoidectomy and colostomy.    Post-operatively, she has had persistent bloating, nausea, and abdominal pain.          Assessment & Plan:  Pneumatosis intestinalis, resolved Colonic stricture c/b adynamic ileus S/p Exploratory laparotomy, LOA x75 minutes, partial sigmoidectomy and end colostomy on 1/20 Persistent ileus Pneumatosis initially managed expectantly.  Repeat imaging showed resolution of pneumatosis but persistent ileus.  Sigmoidoscopy was unrevealing except to note new stricture.  Further expectant management failed, nausea/pain persisted and she went to the OR ex-lap on 1/20.  Ex-lap report unremarkable except for extensive adhesions, stricture; colon divided proximal to this and colostomy formed.   The patient has had no change in pain or nausea post-operatively.  It  appears this is being interpreted as post-operative ileus, which I agree seems within normal expected course.  Today, the patient and family/POA expressed their desire to General Surgery and me that they wished to cease all invasive treatments, begin to withdraw TPN and all medicines, cease blood draws and begin preparing to discharge with Hospice.    This is to me a remarkable request, as the patient's WBC is improved, her CHF is nearly compensated, her renal function is good, and her respiratory status is normal.  I question if her thinking is affected by inadequately controlled pain.    Her depression is well-compensated, she has no history of suicidality or severe depression, and her family are in agreement with her wishes (in fact, stating that she was against invasive treatments all along, and felt inhibited to express this to providers)  I recommend that our first priority is adequate pain and nausea control, and that further goals-of-care should be deferred somewhat until pain control is improved.   -Consult Palliative care for assistance with intractable nausea and pain   -Stop hydromorphone pushes -Start PCA hydromorphone  -Continue TPN, heparin for now, continue labs for now  -Given the unusual nature of her request, I agree with Psychiatry consult regarding depression and its possible effect on her decision making     Decision making capacity To me, although remarkable in her request, the patient's interactions demonstrate logical, ordered thinking, and after discussion of potential benefits and harms of treatment, I believe she has a usual level of understanding of the treatment plan, expected course, and alternative treatments, appreciates the reasonably forseeable consequences of treatment plan and alternatives, and possesses medical decision making capacity.  In a semi-structured interview, the  patient: --was able to articulate her medical problem and the proposed treatment  plan.  --was able to articulate her treatment plan and the option to refuse proposed options --was able to appreciate reasonably forseeable consequences of the proposed treatment plan and of refusing the proposed treatment plan --was not impaired in decision making by psychosis; per PCP, patient's depression has always been mild   -As above, given the unusual nature of her request, I agree with Psychiatry consult regarding depression and its possible effect on her decision making; however, I would point out that even if the patient were deemed incapable of decision making, her POA is in agreement with her expressed wishes to withdraw therapy      Acute kidney injury Resolved.  Cr peaked 1.77 post-op, now resolved to baseline 0.9 mg/dL.  Diabetes -Hold gabapentin -Hold Victoza -Hold Tyler Aas for now -Continue SSI corrections  Chronic diastolic CHF Hypertension Appears slightly edematous.  No dyspnea or hypoxia or orthopnea. -Repeat furosemide once this afternoon -Repeat BMP tomorrow -Hold lisinopril, metoprolol -Hold simvastatin  Paroxysmal atrial fibrillation -Continue heparin  Morbid obesity BMI 58.  Acute blood loss anemia Mild, stable.  Stroke secondary prevention -Continue heparin as above -Hold aspirin and Plavix  Hypokalemia Mild  Depression/Anxiety Patient has a history of depression, but per PCP, this has always been mild, treated with Paxil and well controlled.  No prior history of Psychiatric hospitalization or suicide attempt.  Furthermore, PCP notes that patient's health in last year has been characterized by "one bad thing after another", and that the patient is partially dependent on family members now, which has never been before and has caused strain on her emotionally.        MDM and disposition: The below labs and imaging reports were reviewed and summarized above.  Medication management as above.  The patient was admitted with pneumatosis  intestinalis; this resolved with conservative management, but the patient had persistent ileus.  This has now persisted post-sigmoidectomy.  The patient now wishes to withdraw care.  Due to the unusual nature of this request, we have spent extensive time this afternoon coordinating among Palliative Care, Surgery and the patient's primary care physician regarding her wishes, goals of care, and decision making capacity.          DVT prophylaxis: N/A on therapeutic heparin Code Status: DO NOT RESUSCiTATE Family Communication: Niece/POA at bedside    Consultants:   Cardiology  General Surgery  Gastroenterology  Procedures:   Flex Sig #1 1/17  Flex Sig #2 1/18    Subjective: Sever abdominal pain, diffusely.  Severe nausea.  Vomiting with oral intake.  Pain worst in LLQ.  No fever, chest pain.  Swelling is noted, mild.     Objective: Vitals:   08/05/18 0739 08/05/18 1000 08/05/18 1144 08/05/18 1200  BP: (!) 168/75 (!) 150/67 (!) 173/78 (!) 151/58  Pulse: 79 82 82 88  Resp: 16 12 13 12   Temp: 98 F (36.7 C)  97.9 F (36.6 C)   TempSrc: Axillary  Axillary   SpO2: 95% 95% 93% 93%  Weight:      Height:        Intake/Output Summary (Last 24 hours) at 08/05/2018 1357 Last data filed at 08/04/2018 2357 Gross per 24 hour  Intake -  Output 2510 ml  Net -2510 ml   Filed Weights   08/03/18 0426 08/04/18 0300 08/05/18 0345  Weight: 126.7 kg 130.5 kg 130.6 kg    Examination: General appearance:  adult female, alert  and in moderate distress from pain.   HEENT: Anicteric, conjunctiva pink, lids and lashes normal. No nasal deformity, discharge, epistaxis.  Lips dry.   Skin: Warm and dry.  no jaundice.  No suspicious rashes or lesions. Cardiac: RRR, nl S1-S2, no murmurs appreciated.  Capillary refill is brisk.  JVP not visible.  Mild bilateral LE edema.  Radial pulses 2+ and symmetric. Respiratory: Normal respiratory rate and rhythm.  CTAB without rales or  wheezes. Abdomen: Abdomen soft.  Severe Left sided TTP with voluntary guarding.  No rigidity or rebound. No ascites, distension, hepatosplenomegaly.   MSK: No deformities or effusions. Neuro: Awake and alert.  EOMI, moves all extremities. Speech fluent.    Psych: Sensorium intact and responding to questions, attention normal. Affect blunted by pain.  Judgment and insight appear normal.    Data Reviewed: I have personally reviewed following labs and imaging studies:  CBC: Recent Labs  Lab 08/01/18 0644 08/02/18 0230 08/03/18 0325 08/04/18 0330 08/05/18 0425  WBC 12.3* 14.4* 30.0* 14.5* 16.4*  NEUTROABS  --   --   --   --  13.6*  HGB 10.8* 11.3* 9.7* 8.8* 9.5*  HCT 35.2* 36.2 31.4* 27.8* 30.5*  MCV 93.1 93.5 95.2 93.9 93.8  PLT 339 384 339 244 283   Basic Metabolic Panel: Recent Labs  Lab 07/30/18 0613  08/01/18 0244 08/02/18 0230 08/03/18 0325 08/04/18 0330 08/04/18 1148 08/04/18 1538 08/05/18 0425  NA 143   < > 146* 147* 143 145  --  146* 144  K 3.2*   < > 3.8 3.3* 3.8 2.7*  --  3.5 3.2*  CL 116*   < > 119* 119* 119* 121*  --  121* 118*  CO2 18*   < > 13* 15* 13* 18*  --  19* 19*  GLUCOSE 97   < > 109* 135* 284* 126*  --  135* 202*  BUN 7*   < > 10 14 20 17   --  13 15  CREATININE 0.90   < > 1.03* 1.14* 1.77* 1.16*  --  1.08* 1.06*  CALCIUM 8.6*   < > 8.8* 9.2 9.0 8.9  --  8.9 8.9  MG 2.0  --  2.1  --   --   --  2.0  --  1.8  PHOS 1.9*  --   --   --   --   --  2.7  --  4.2   < > = values in this interval not displayed.   GFR: Estimated Creatinine Clearance: 61 mL/min (A) (by C-G formula based on SCr of 1.06 mg/dL (H)). Liver Function Tests: Recent Labs  Lab 08/05/18 0425  AST 10*  ALT 14  ALKPHOS 71  BILITOT 0.4  PROT 5.3*  ALBUMIN 2.1*   No results for input(s): LIPASE, AMYLASE in the last 168 hours. No results for input(s): AMMONIA in the last 168 hours. Coagulation Profile: No results for input(s): INR, PROTIME in the last 168 hours. Cardiac  Enzymes: No results for input(s): CKTOTAL, CKMB, CKMBINDEX, TROPONINI in the last 168 hours. BNP (last 3 results) No results for input(s): PROBNP in the last 8760 hours. HbA1C: No results for input(s): HGBA1C in the last 72 hours. CBG: Recent Labs  Lab 08/04/18 2020 08/04/18 2355 08/05/18 0344 08/05/18 0741 08/05/18 1146  GLUCAP 154* 159* 192* 177* 245*   Lipid Profile: Recent Labs    08/05/18 0425  TRIG 158*   Thyroid Function Tests: No results for input(s): TSH, T4TOTAL, FREET4, T3FREE,  THYROIDAB in the last 72 hours. Anemia Panel: No results for input(s): VITAMINB12, FOLATE, FERRITIN, TIBC, IRON, RETICCTPCT in the last 72 hours. Urine analysis:    Component Value Date/Time   COLORURINE YELLOW 07/27/2018 [place] 07/27/2018 1645   LABSPEC 1.030 07/27/2018 1645   PHURINE 5.0 07/27/2018 1645   GLUCOSEU >=500 (A) 07/27/2018 1645   HGBUR NEGATIVE 07/27/2018 1645   BILIRUBINUR NEGATIVE 07/27/2018 1645   KETONESUR 20 (A) 07/27/2018 1645   PROTEINUR NEGATIVE 07/27/2018 1645   UROBILINOGEN 1.0 05/28/2015 0122   NITRITE NEGATIVE 07/27/2018 1645   LEUKOCYTESUR NEGATIVE 07/27/2018 1645   Sepsis Labs: @LABRCNTIP (procalcitonin:4,lacticacidven:4)  ) Recent Results (from the past 240 hour(s))  Culture, Urine     Status: None   Collection Time: 07/27/18  4:51 PM  Result Value Ref Range Status   Specimen Description URINE, CATHETERIZED  Final   Special Requests NONE  Final   Culture   Final    NO GROWTH Performed at [facility], [place] [address], [place], [place] [address]    Report Status 07/28/2018 FINAL  Final  MRSA PCR Screening     Status: None   Collection Time: 08/02/18  6:20 PM  Result Value Ref Range Status   MRSA by PCR NEGATIVE NEGATIVE Final    Comment:        The GeneXpert MRSA Assay (FDA approved for NASAL specimens only), is one component of a comprehensive MRSA colonization surveillance program. It is not intended to diagnose  MRSA infection nor to guide or monitor treatment for MRSA infections. Performed at [facility], [place] [address], [place], [place] [address]          Radiology Studies: Dg Chest Port 1 View  Result Date: 08/04/2018 CLINICAL DATA:  Status post central line placement EXAM: PORTABLE CHEST 1 VIEW COMPARISON:  07/27/2018 FINDINGS: Cardiac shadow is enlarged but stable. Lungs are well aerated bilaterally. No pneumothorax is noted following central line placement on the right. Catheter tip is noted at the cavoatrial junction. No infiltrate is noted. IMPRESSION: No pneumothorax following central line placement. Electronically Signed   By: Inez Catalina M.D.   On: 08/04/2018 15:30   [place] Ekg Site Rite  Result Date: 08/04/2018 If Site Rite image not attached, placement could not be confirmed due to current cardiac rhythm.       Scheduled Meds: . acetaminophen  1,000 mg Oral Q6H  . gabapentin  300 mg Oral TID  . insulin aspart  0-15 Units Subcutaneous Q4H   Continuous Infusions: . sodium chloride 0 mL/hr at 07/30/18 0508  . heparin 1,900 Units/hr (08/05/18 1208)  . methocarbamol (ROBAXIN) IV 500 mg (08/05/18 0555)  . potassium chloride 10 mEq (08/05/18 1304)  . TPN ADULT (ION) 30 mL/hr at 08/04/18 1838  . TPN ADULT (ION)       LOS: 9 days    Time spent: 90 minutes    Edwin Dada, MD [facility] 08/05/2018, 1:57 PM     Please page through [place]:  [email or web address] Password [email or web address] If 7PM-7AM, please contact night-coverage

## 2018-08-05 NOTE — Progress Notes (Signed)
Progress Note  Patient Name: Sharon [name] Date of Encounter: 08/05/2018  Primary Cardiologist: Candee Furbish, MD   Subjective   Pt is still nauseated   No CP    Breathing is fair Inpatient Medications    Scheduled Meds: . acetaminophen  1,000 mg Oral Q6H  . insulin aspart  0-15 Units Subcutaneous Q4H   Continuous Infusions: . sodium chloride 0 mL/hr at 07/30/18 0508  . heparin 1,600 Units/hr (08/05/18 9381)  . lactated ringers Stopped (08/04/18 1400)  . magnesium sulfate 1 - 4 g bolus IVPB    . methocarbamol (ROBAXIN) IV 500 mg (08/05/18 0555)  . potassium chloride    . TPN ADULT (ION) 30 mL/hr at 08/04/18 1838  . TPN ADULT (ION)     PRN Meds: sodium chloride, hydrALAZINE, HYDROmorphone (DILAUDID) injection, metoprolol tartrate, ondansetron **OR** ondansetron (ZOFRAN) IV, oxyCODONE, promethazine   Vital Signs    Vitals:   08/04/18 2356 08/05/18 0345 08/05/18 0739 08/05/18 1000  BP:   (!) 168/75 (!) 150/67  Pulse:   79 82  Resp:   16 12  Temp: 98.3 F (36.8 C) 98.7 F (37.1 C) 98 F (36.7 C)   TempSrc: Oral Oral Axillary   SpO2:   95% 95%  Weight:  130.6 kg    Height:        Intake/Output Summary (Last 24 hours) at 08/05/2018 1039 Last data filed at 08/04/2018 2357 Gross per 24 hour  Intake -  Output 2510 ml  Net -2510 ml   I/O   +12 L  Last 3 Weights 08/05/2018 08/04/2018 08/03/2018  Weight (lbs) 287 lb 14.7 oz 287 lb 11.2 oz 279 lb 5.2 oz  Weight (kg) 130.6 kg 130.5 kg 126.7 kg      Telemetry     SR   Short burst of PAT   1 16 beat run NSVT   - Personally Reviewed  ECG      Physical Exam   GEN: Morbidly obese 73 yo in NAD Neck: Neck is full  Cardiac: RRR, II/VI systolic murmur at base   No  rubs, or gallops.  Respiratory: REl clear anteirorly   GI: Distended   No BS heard MS: Tr  edema hands and feet/legs   Labs    Chemistry Recent Labs  Lab 08/04/18 0330 08/04/18 1538 08/05/18 0425  NA 145 146* 144  K 2.7* 3.5 3.2*  CL 121* 121*  118*  CO2 18* 19* 19*  GLUCOSE 126* 135* 202*  BUN 17 13 15   CREATININE 1.16* 1.08* 1.06*  CALCIUM 8.9 8.9 8.9  PROT  --   --  5.3*  ALBUMIN  --   --  2.1*  AST  --   --  10*  ALT  --   --  14  ALKPHOS  --   --  71  BILITOT  --   --  0.4  GFRNONAA 48* 52* 53*  GFRAA 55* >60 >60  ANIONGAP 6 6 7      Hematology Recent Labs  Lab 08/03/18 0325 08/04/18 0330 08/05/18 0425  WBC 30.0* 14.5* 16.4*  RBC 3.30* 2.96* 3.25*  HGB 9.7* 8.8* 9.5*  HCT 31.4* 27.8* 30.5*  MCV 95.2 93.9 93.8  MCH 29.4 29.7 29.2  MCHC 30.9 31.7 31.1  RDW 14.7 14.6 15.0  PLT 339 244 292    Cardiac EnzymesNo results for input(s): TROPONINI in the last 168 hours. No results for input(s): TROPIPOC in the last 168 hours.   BNPNo results for  input(s): BNP, PROBNP in the last 168 hours.   DDimer No results for input(s): DDIMER in the last 168 hours.   Radiology    Dg Chest Port 1 View  Result Date: 08/04/2018 CLINICAL DATA:  Status post central line placement EXAM: PORTABLE CHEST 1 VIEW COMPARISON:  07/27/2018 FINDINGS: Cardiac shadow is enlarged but stable. Lungs are well aerated bilaterally. No pneumothorax is noted following central line placement on the right. Catheter tip is noted at the cavoatrial junction. No infiltrate is noted. IMPRESSION: No pneumothorax following central line placement. Electronically Signed   By: Inez Catalina M.D.   On: 08/04/2018 15:30   [place] Ekg Site Rite  Result Date: 08/04/2018 If Site Rite image not attached, placement could not be confirmed due to current cardiac rhythm.   Cardiac Studies    Patient Profile     73 y.o. female hx of CP, palpitations, mild AS    Now post op day 1 from GI surgery (s/p partial colectomy with lysis of adhesions)  Assessment & Plan    1Rhythm  Pt having recurrent tachycardia consistent with atrial fib   Self limited bursts   WIth this I would recomm long term anticoagulion    She is on heparin now that she is NPO   WIll transition at some  point to oral meds   2AS   Mild by echo     Pt with signif 3rd spacing   Diuresed some with lasix  REnal function good   Getting KCL now Now that she is on TPN I would stop lactated ringers    REpeat BMET later   COnsider additional lasix this PM   For questions or updates, please contact [place] Please consult [email or web address] for contact info under        Signed, Dorris Carnes, MD  08/05/2018, 10:39 AM

## 2018-08-05 NOTE — Progress Notes (Addendum)
Called to see patient by RN as patient's niece, [name], is present, who is her POA, and wanted an update.  I arrived and gave her an update on the patient's condition.  She mouthed to me "she keeps telling me she is done."  We went over her wishes which is DNR and no life saving measures.  I sat next to [facility] and explained that a lot of the pain she was having as well as her nausea is expected in the acute post operative period and that her TNA is intended to be short-term until her ileus resolves etc.  She stated she understood this, but then expressed "I'm done.  I understand what you are telling me, but I want to be done!"  We thoroughly discussed this between the patient, her niece, the chaplain in the room, and myself.  Alyssandra very calmly, but adamantly expressed her desires.  She expressed that she would like comfort measures only at this point.  " I just want to go to sleep" is here desire at this time.  I met with the niece outside who only wants what the patient wants and wants to do die with dignity and what her wishes are.  Dr. Loleta Books arrived and this was all discussed with him.  He will arrange for palliative care consult but go ahead and discuss with SW as well to begin making arrangements for hospice care.  I will defer further palliative care arrangements to medicine and PM once they see the patient.  Henreitta Cea 1:32 PM 08/05/2018

## 2018-08-05 NOTE — Progress Notes (Signed)
Sharon [name] for heparin Indication: atrial fibrillation  Allergies  Allergen Reactions  . Crestor [Rosuvastatin] Other (See Comments)    Myalgias  . Indomethacin Other (See Comments)    dizziness  . Invanz [Ertapenem Sodium] Hives  . Lipitor [Atorvastatin Calcium] Other (See Comments)    Myalgias   . Reglan [Metoclopramide] Other (See Comments)    TREMORS    Patient Measurements: Height: 4\' 11"  (149.9 cm) Weight: 287 lb 14.7 oz (130.6 kg) IBW/kg (Calculated) : 43.2 Heparin Dosing Weight:   Vital Signs: Temp: 98.2 F (36.8 C) (01/23 1953) Temp Source: Oral (01/23 1953) BP: 191/80 (01/23 1953) Pulse Rate: 90 (01/23 1953)  Labs: Recent Labs    08/03/18 0325 08/04/18 0330 08/04/18 1538 08/04/18 2000 08/05/18 0425 08/05/18 0600 08/05/18 2014  HGB 9.7* 8.8*  --   --  9.5*  --   --   HCT 31.4* 27.8*  --   --  30.5*  --   --   PLT 339 244  --   --  292  --   --   HEPARINUNFRC  --   --   --  <0.10*  --  0.15* 0.52  CREATININE 1.77* 1.16* 1.08*  --  1.06*  --   --     Estimated Creatinine Clearance: 61 mL/min (A) (by C-G formula based on SCr of 1.06 mg/dL (H)).   Medical History: Past Medical History:  Diagnosis Date  . Anxiety   . Asthma   . CHF (congestive heart failure) ([place])   . Depression   . Diabetes mellitus   . GERD (gastroesophageal reflux disease)   . Heart murmur    echo- 04/2016- mild aortic stenosis  . History of kidney stones    passed  . Hypercholesteremia   . Hypertension   . Kidney stones    "last kidney numbers normal"  . Left ear hearing loss   . Osteoarthritis   . Seizures ([place]) 04/2016   due to Hyperosmolar Nonketotic hyperglycemia- is what it was thought to be cause by. No further seizure activity.  . Thrombophlebitis    Assessment: 73 yo female with past medical history significant for chronic diastolic heart failure, type 2 diabetes insulin-dependent, non-hemorrhagic CVA, atrial  fibrillation.  Patient enrolled in [employer] study, last dose of apixaban vs placebo was noted to be 1/12 so if true medication it is cleared by now. Will plan on resuming oral AC once cleared by surgery, for now will use IV heparin. With recent surgeries will aim for low goal and omit boluses.   Heparin level now therapeutic at 0.52 but slightly above lower goal threshold given recent surgeries. Will reduce infusion rate slightly, and recheck with daily labs.  Goal of Therapy:  Heparin level 0.3-0.5 units/ml Monitor platelets by anticoagulation protocol: Yes   Plan:  -Reduce heparin slightly to 1850 units/h -Recheck heparin level with daily labs  Arrie Senate, PharmD, BCPS Clinical Pharmacist [phone] Please check AMION for all [facility] numbers 08/05/2018

## 2018-08-05 NOTE — Progress Notes (Signed)
Subjective Still with nausea, but denies emesis. Having abdominal cramps/aches. Colostomy with stool and gas. No ostomy leaks overnight  Objective: Vital signs in last 24 hours: Temp:  [98 F (36.7 C)-98.7 F (37.1 C)] 98 F (36.7 C) (01/23 0739) Pulse Rate:  [79-87] 79 (01/23 0739) Resp:  [14-21] 16 (01/23 0739) BP: (166-181)/(59-75) 168/75 (01/23 0739) SpO2:  [95 %-99 %] 95 % (01/23 0739) Weight:  [130.6 kg] 130.6 kg (01/23 0345) Last BM Date: 08/04/18  Intake/Output from previous day: 01/22 0701 - 01/23 0700 In: -  Out: 2515 [Urine:2350; Drains:60; Stool:105] Intake/Output this shift: No intake/output data recorded.  Gen: NAD, comfortable CV: RRR Pulm: Normal work of breathing Abd: Soft, obese, mild tenderness around midline incision; colostomy pink with liquid stool in appliance + gas. JP with thin ss output Ext: SCDs in place  Lab Results: CBC  Recent Labs    08/04/18 0330 08/05/18 0425  WBC 14.5* 16.4*  HGB 8.8* 9.5*  HCT 27.8* 30.5*  PLT 244 292   BMET Recent Labs    08/04/18 1538 08/05/18 0425  NA 146* 144  K 3.5 3.2*  CL 121* 118*  CO2 19* 19*  GLUCOSE 135* 202*  BUN 13 15  CREATININE 1.08* 1.06*  CALCIUM 8.9 8.9   PT/INR No results for input(s): LABPROT, INR in the last 72 hours. ABG No results for input(s): PHART, HCO3 in the last 72 hours.  Invalid input(s): PCO2, PO2  Studies/Results:  Anti-infectives: Anti-infectives (From admission, onward)   Start     Dose/Rate Route Frequency Ordered Stop   08/02/18 1300  piperacillin-tazobactam (ZOSYN) IVPB 3.375 g     3.375 g 100 mL/hr over 30 Minutes Intravenous To Surgery 08/02/18 1254 08/02/18 1358   07/27/18 0400  piperacillin-tazobactam (ZOSYN) IVPB 3.375 g  Status:  Discontinued     3.375 g 12.5 mL/hr over 240 Minutes Intravenous Every 8 hours 07/26/18 2203 08/02/18 1820   07/26/18 2130  piperacillin-tazobactam (ZOSYN) IVPB 3.375 g     3.375 g 100 mL/hr over 30 Minutes Intravenous   Once 07/26/18 2129 07/26/18 2218       Assessment/Plan: Patient Active Problem List   Diagnosis Date Noted  . Elevated LFTs 07/27/2018  . Aortic atherosclerosis ([place]) 07/27/2018  . 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 Cerebral thrombosis with cerebral infarction 05/11/2018  . CVA (cerebral vascular accident) ([place]) 05/11/2018  . Stroke-like symptoms 05/10/2018  . Hyperosmolar (nonketotic) coma ([place]) 04/15/2016  . Seizure ([place]) 04/15/2016  . AKI (acute kidney injury) ([place]) 04/15/2016  . Chronic diastolic heart failure ([place]) 04/15/2016  . Chronic diastolic CHF (congestive heart failure) ([place]) 06/04/2015  . Diverticulitis 05/28/2015  . Sigmoid diverticulitis 05/28/2015  . Hypokalemia 05/28/2015  . Diabetes mellitus type 2, controlled ([place]) 05/28/2015  . Chronic anemia 05/28/2015  . Diverticulitis of large intestine without perforation or abscess without bleeding   . Palpitation 04/12/2015  . Morbid obesity ([place]) 04/12/2015  . Mild aortic stenosis 04/12/2015  . Type 2 diabetes mellitus without complication ([place]) 39/76/7341  . Diabetes mellitus ([place]) 06/27/2014  . Hypertension 06/27/2014  . Chest pain 06/27/2014  . Asthma 06/27/2014   s/p Procedure(s): EXPLORATORY LAPAROTOMY LYSIS OF ADHESION PARTIAL COLECTOMYsigmoid end COLOSTOMY 08/02/2018  -WOCN following -Appreciate medicine's assistance in her care -Glucose control -Continue NPO until nausea improved - expected postop ileus although stoma is now productive -Zofran -PICC/TPN -Once ileus resolved, will work on thickening output to help with pouching -PT/OT -Ppx: SCDs, on heparin gtt for hx of afib -Dispo: ultimately planning  snf   LOS: 9 days   Sharon Mt. Dema Severin, M.D. [facility]

## 2018-08-05 NOTE — Consult Note (Addendum)
Consultation Note Date: 08/05/2018   Patient Name: Sharon [name]  DOB: [birth year]  MRN: [id number]  Age / Sex: 73 y.o., female  PCP: [name], MD Referring Physician: Edwin Dada, *  Reason for Consultation: Establishing goals of care  HPI/Patient Profile: Shanicqua Coldren is a 73 yo female with past medical history significant for chronic diastolic heart failure, type 2 diabetes insulin-dependent, non-hemorrhagic CVA, atrial fibrillation seen on loop recorder reportedly, who presented with abdominal pain.   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 She has no children, and her niece is her POA.   She states "I'm done, I am ready to go to my heavenly home." She continues to make statements such as this and stated "I'm ready to die." Upon trying to explore her feelings and what her thoughts are. She is able to tell me she had a left hemicolectomy. She is not able to tell me the month, and there are other statements which demonstrate confusion.   Niece who is POA came to bedside. She states her aunt has been treated for depression since she was young. She states she has wanted to die for years and has made it clear she wanted no interventions to keep her alive. 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 Her niece tells me she did not want to come to the hospital but the patient's sister called 36.   We discussed discharge planning and a comfortable way of discharge. She does not meet hospice criteria at this time as she is improving, and her chronic issues are not at a level to warrant hospice. She does not want rehab. She is unable to eat and drink which would be concerning for her to go home. No safe discharge noted given this.   Plans to continue current course. She and her niece who is POA is in  agreement.     SUMMARY OF RECOMMENDATIONS   Recommend psych consult as she has a history of treated depression with her current statements of wanting to die. Also, would like psych to determine capacity as she seems confused and I'm concerned for complex decision making capacity at this time.    Code Status/Advance Care Planning:  DNR    Symptom Management:   Per primary team.   Prognosis:   Unable to determine  Discharge Planning: To Be Determined      Primary Diagnoses: Present on Admission: . Pneumatosis intestinalis . Chronic diastolic CHF (congestive heart failure) ([place]) . CVA (cerebral vascular accident) ([place]) . Hypokalemia . AKI (acute kidney injury) ([place])   I have reviewed the medical record, interviewed the patient and family, and examined the patient. The following aspects are pertinent.  Past Medical History:  Diagnosis Date  . Anxiety   . Asthma   . CHF (congestive heart failure) ([place])   . Depression   . Diabetes mellitus   . GERD (gastroesophageal reflux disease)   .  Heart murmur    echo- 04/2016- mild aortic stenosis  . History of kidney stones    passed  . Hypercholesteremia   . Hypertension   . Kidney stones    "last kidney numbers normal"  . Left ear hearing loss   . Osteoarthritis   . Seizures ([place]) 04/2016   due to Hyperosmolar Nonketotic hyperglycemia- is what it was thought to be cause by. No further seizure activity.  . Thrombophlebitis    Social History   Socioeconomic History  . Marital status: Single    Spouse name: Not on file  . Number of children: Not on file  . Years of education: Not on file  . Highest education level: Not on file  Occupational History  . Not on file  Social Needs  . Financial resource strain: Not on file  . Food insecurity:    Worry: Not on file    Inability: Not on file  . Transportation needs:    Medical: Not on file    Non-medical: Not on file  Tobacco Use  . Smoking status: Never Smoker    . Smokeless tobacco: Never Used  Substance and Sexual Activity  . Alcohol use: Not Currently    Frequency: Never    Comment: maybe 1 drink every few months  . Drug use: No  . Sexual activity: Not on file  Lifestyle  . Physical activity:    Days per week: Not on file    Minutes per session: Not on file  . Stress: Not on file  Relationships  . Social connections:    Talks on phone: Not on file    Gets together: Not on file    Attends religious service: Not on file    Active member of club or organization: Not on file    Attends meetings of clubs or organizations: Not on file    Relationship status: Not on file  Other Topics Concern  . Not on file  Social History Narrative  . Not on file   Family History  Problem Relation Age of Onset  . Heart attack Mother        8 HEART ATTACKS  . Stroke Mother        3 STROKES  . Heart attack Father   . Stroke Father    Scheduled Meds: . acetaminophen  1,000 mg Oral Q6H  . furosemide  40 mg Intravenous Once  . gabapentin  300 mg Oral TID  . HYDROmorphone   Intravenous Q4H  . insulin aspart  0-15 Units Subcutaneous Q4H   Continuous Infusions: . sodium chloride 0 mL/hr at 07/30/18 0508  . heparin 1,900 Units/hr (08/05/18 1208)  . methocarbamol (ROBAXIN) IV 500 mg (08/05/18 0555)  . potassium chloride 10 mEq (08/05/18 1556)  . TPN ADULT (ION) 30 mL/hr at 08/04/18 1838  . TPN ADULT (ION)     PRN Meds:.sodium chloride, diphenhydrAMINE **OR** diphenhydrAMINE, hydrALAZINE, metoprolol tartrate, naloxone **AND** sodium chloride flush, ondansetron (ZOFRAN) IV, ondansetron **OR** [DISCONTINUED] ondansetron (ZOFRAN) IV, oxyCODONE, promethazine Medications Prior to Admission:  Prior to Admission medications   Medication Sig Start Date End Date Taking? Authorizing Provider  aspirin EC 81 MG EC tablet Take 1 tablet (81 mg total) by mouth daily. 05/12/18  Yes Hosie Poisson, MD  Calcium Carbonate-Vitamin D (CALTRATE 600+D) 600-400 MG-UNIT per  tablet Take 2 tablets by mouth daily.    Yes [provider]  Carboxymethylcellul-Glycerin (LUBRICATING EYE DROPS OP) Place 1-2 drops into both eyes 4 (four) times daily  as needed (for dry eyes).   Yes [provider]  dexlansoprazole (DEXILANT) 60 MG capsule Take 60 mg by mouth daily.     Yes [provider]  dextromethorphan-guaiFENesin (MUCINEX DM) 30-600 MG 12hr tablet Take 1 tablet by mouth 2 (two) times daily as needed for cough.   Yes [provider]  ferrous sulfate 325 (65 FE) MG tablet Take 325 mg by mouth daily with breakfast.   Yes [provider]  furosemide (LASIX) 40 MG tablet Take 40 mg by mouth 2 (two) times daily.   Yes [provider]  gabapentin (NEURONTIN) 300 MG capsule Take 600 mg by mouth 2 (two) times daily.   Yes [provider]  insulin aspart (NOVOLOG FLEXPEN) 100 UNIT/ML FlexPen Inject 10-30 Units into the skin 3 (three) times daily with meals. Per sliding scale    Yes [provider]  Lidocaine-Hydrocortisone Ace 3-0.5 % CREA Apply 1 application topically daily as needed (for itching).   Yes [provider]  liraglutide (VICTOZA) 18 MG/3ML SOPN Inject 1.8 mg into the skin daily.   Yes [provider]  lisinopril (PRINIVIL,ZESTRIL) 10 MG tablet Take 10 mg by mouth daily.   Yes [provider]  meloxicam (MOBIC) 15 MG tablet Take 15 mg by mouth daily.   Yes [provider]  metoprolol succinate (TOPROL-XL) 25 MG 24 hr tablet Take 1 tablet (25 mg total) by mouth daily. 11/18/17  Yes Jerline Pain, MD  ondansetron (ZOFRAN) 4 MG tablet Take 1 tablet (4 mg total) by mouth every 8 (eight) hours as needed for nausea or vomiting. 05/12/18  Yes Hosie Poisson, MD  PARoxetine (PAXIL) 40 MG tablet Take 80 mg by mouth every morning.    Yes [provider]  polyethylene glycol (MIRALAX / GLYCOLAX) packet Take 17 g by mouth daily as needed for mild constipation.   Yes  [provider]  Probiotic Product (ALIGN) 4 MG CAPS Take 8 mg by mouth daily.   Yes [provider]  simvastatin (ZOCOR) 40 MG tablet Take 40 mg by mouth every evening. 05/01/18  Yes [provider]  Study - [place] - apixaban 5mg  or Placebo Take 2.5 mg by mouth 2 (two) times daily.   Yes [provider]  TRESIBA FLEXTOUCH 200 UNIT/ML SOPN Inject 80 Units into the skin daily. Will need further dose adjustment at Follow up 04/18/16  Yes Domenic Polite, MD  beta carotene w/minerals (OCUVITE) tablet Take 1 tablet by mouth daily.    [provider]  clopidogrel (PLAVIX) 75 MG tablet Take 1 tablet (75 mg total) by mouth daily. Please contact your neurologist for further refills. Patient not taking: Reported on 06/19/2018 06/02/18   Thompson Grayer, MD   Allergies  Allergen Reactions  . Crestor [Rosuvastatin] Other (See Comments)    Myalgias  . Indomethacin Other (See Comments)    dizziness  . Invanz [Ertapenem Sodium] Hives  . Lipitor [Atorvastatin Calcium] Other (See Comments)    Myalgias   . Reglan [Metoclopramide] Other (See Comments)    TREMORS   Review of Systems  Gastrointestinal: Positive for nausea and vomiting.    Physical Exam Constitutional:      Appearance: She is obese.  Pulmonary:     Effort: Pulmonary effort is normal.  Skin:    General: Skin is warm and dry.     Vital Signs: BP (!) 165/73 (BP Location: Left Wrist)   Pulse 86   Temp 98.7 F (37.1 C) (Oral)  Resp 16   Ht 4\' 11"  (1.499 m)   Wt 130.6 kg   LMP  (LMP Unknown)   SpO2 94%   BMI 58.15 kg/m  Pain Scale: 0-10 POSS *See Group Information*: 1-Acceptable,Awake and alert Pain Score: 6    SpO2: SpO2: 94 % O2 Device:SpO2: 94 % O2 Flow Rate: .O2 Flow Rate (L/min): 2 L/min  IO: Intake/output summary:   Intake/Output Summary (Last 24 hours) at 08/05/2018 1619 Last data filed at 08/05/2018 1500 Gross per 24 hour  Intake 0 ml  Output 2280 ml  Net -2280 ml     LBM: Last BM Date: 08/04/18 Baseline Weight: Weight: 120.2 kg Most recent weight: Weight: 130.6 kg     Palliative Assessment/Data:     Time In: 3:00 Time Out: 4:50 Time Total: 1 hr 50 min Greater than 50%  of this time was spent counseling and coordinating care related to the above assessment and plan.  Case discussed with RN, Dr. Loleta Books and Dr. Loistine Chance.   Signed by: Asencion Gowda, NP   Please contact Palliative Medicine Team phone at [phone] for questions and concerns.  For individual provider: See Shea Evans

## 2018-08-05 NOTE — Progress Notes (Signed)
Patient refusing CBG monitoring states " I'm not doing them anymore."

## 2018-08-05 NOTE — Progress Notes (Signed)
Industry for heparin Indication: atrial fibrillation  Allergies  Allergen Reactions  . Crestor [Rosuvastatin] Other (See Comments)    Myalgias  . Indomethacin Other (See Comments)    dizziness  . Invanz [Ertapenem Sodium] Hives  . Lipitor [Atorvastatin Calcium] Other (See Comments)    Myalgias   . Reglan [Metoclopramide] Other (See Comments)    TREMORS    Patient Measurements: Height: 4\' 11"  (149.9 cm) Weight: 287 lb 14.7 oz (130.6 kg) IBW/kg (Calculated) : 43.2 Heparin Dosing Weight:   Vital Signs: Temp: 98 F (36.7 C) (01/23 0739) Temp Source: Axillary (01/23 0739) BP: 150/67 (01/23 1000) Pulse Rate: 82 (01/23 1000)  Labs: Recent Labs    08/03/18 0325 08/04/18 0330 08/04/18 1538 08/04/18 2000 08/05/18 0425 08/05/18 0600  HGB 9.7* 8.8*  --   --  9.5*  --   HCT 31.4* 27.8*  --   --  30.5*  --   PLT 339 244  --   --  292  --   HEPARINUNFRC  --   --   --  <0.10*  --  0.15*  CREATININE 1.77* 1.16* 1.08*  --  1.06*  --     Estimated Creatinine Clearance: 61 mL/min (A) (by C-G formula based on SCr of 1.06 mg/dL (H)).   Medical History: Past Medical History:  Diagnosis Date  . Anxiety   . Asthma   . CHF (congestive heart failure) ([place])   . Depression   . Diabetes mellitus   . GERD (gastroesophageal reflux disease)   . Heart murmur    echo- 04/2016- mild aortic stenosis  . History of kidney stones    passed  . Hypercholesteremia   . Hypertension   . Kidney stones    "last kidney numbers normal"  . Left ear hearing loss   . Osteoarthritis   . Seizures ([place]) 04/2016   due to Hyperosmolar Nonketotic hyperglycemia- is what it was thought to be cause by. No further seizure activity.  . Thrombophlebitis    Assessment: 73 yo female with past medical history significant for chronic diastolic heart failure, type 2 diabetes insulin-dependent, non-hemorrhagic CVA, atrial fibrillation.  Patient enrolled in [place] study,  last dose of apixaban vs placebo was noted to be 1/12 so if true medication it is cleared by now. Will plan on resuming oral AC once cleared by surgery, for now will use IV heparin. With recent surgeries will aim for low goal and omit boluses.   Heparin level still low this morning, will adjust. No bleeding issues noted, hgb stable.   Goal of Therapy:  Heparin level 0.3-0.5 units/ml Monitor platelets by anticoagulation protocol: Yes   Plan:  Increase IV heparin to 1900 units/hr Recheck heparin level in 8 hrs Daily heparin level and CBC.  Erin Hearing PharmD., BCPS Clinical Pharmacist 08/05/2018 11:29 AM

## 2018-08-05 NOTE — Progress Notes (Signed)
PHARMACY - ADULT TOTAL PARENTERAL NUTRITION CONSULT NOTE   Pharmacy Consult:  TPN Indication:  Colonic stricture with partial obstruction  Patient Measurements: Height: 4\' 11"  (149.9 cm) Weight: 287 lb 14.7 oz (130.6 kg) IBW/kg (Calculated) : 43.2 TPN AdjBW (KG): 61.1 Body mass index is 58.15 kg/m.  Assessment:  41 YOF presented on 07/26/18 with abdominal pain, nausea and vomiting, and found to have pneumatosis.  Sigmoidoscopy on 07/30/18 showed colonic stricture and on 07/31/18 showed diverticulosis.  Patient underwent ex-lap with LoA, partial sigmoidectomy and end colostomy on 08/02/18.  Pharmacy consulted to initiate TPN given inadequate nutrition since admit.   GI: hx GERD.  BL prealbumin low at 5.6.  Drain O/P 63mL (down), stool O/P 133mL (down).  PRN Zofran Endo: DM on Tresiba, Novolog and Victoza PTA.  Was on Lantus 35/d prior to TPN.  CBGs trending up with TPN but unsure yet of pt's 24 hr requirement. Insulin requirements in the past 24 hours: 6 units (9 since TPN started) Lytes: K 3.2 post 5 runs and Lasix 60mg  IV, high CL and low CO2, CoCa high normal 10.42, Phos 4.2 post 15 mmol and Mag low normal Renal: SCr down 1.06, BUN WNL - UOP 0.7 ml/kg/hr, off LR at 125 ml/hr Pulm: stable on RA Cards: CHF/Afib/HLD - BP improving, HR controlled - PRN hydralazine, Lasix 60mg  IV on 1/22 PM AC: Heparin for Afib - hgb 9.5, plts WNL Hepatobil: LFTs / tbili normalized.  TG mildly elevated at 158. Neuro: CVA/depression/seizure - APAP, Robaxin, PRN Dilaudid ID: [email or web address], WBC up to 16.4 - not on abx TPN Access: CVC double lumen placed 08/02/18 TPN start date: 08/04/18  Nutritional Goals (RD rec pending): 1450-1600 kCal and 90-105gm protein per day  Current Nutrition:  TPN  Plan:   Increase TPN to 45 ml/hr (goal rate 70 ml/hr) TPN will provide 60g AA, 130g CHO and 39g ILE for a total of 969 kCal, meeting ~65% of patient's needs Electrolytes in TPN: incr K/Mag, reduce Na/Ca, max acetate Daily  multivitamin and trace elements in TPN Continue moderate SSI Q4H + increase regular insulin in TPN to 30 units KCL x 5 runs Mag sulfate 1gm IV x 1 F/U AM labs, CBGs to advance TPN rate   Rajesh Wyss D. Mina Marble, PharmD, BCPS, [place] 08/05/2018, 9:56 AM

## 2018-08-05 NOTE — Progress Notes (Signed)
Patient sounds as if she does not want to continue care,  She is tired and in pain and does not want her life prolonged.  I was requested by niece [name] and the doctor came in.  I told niece this is the time to speak up and tell doctor or let patient tell doctor about her wishes.  The doctor spoke compassionately with patient and mentioned setting up Palliative care consult and letting patient choose to have comfort care and stop other things.  Conard Novak, Chaplain   08/05/18 1300  Clinical Encounter Type  Visited With Patient;Family;Other (Comment) (niece named [name])  Visit Type Initial;Other (Comment) (questions about advanced directive in place)  Referral From Family  Consult/Referral To Chaplain  Spiritual Encounters  Spiritual Needs Other (Comment) (talk about AD)  Stress Factors  Patient Stress Factors Health changes;Other (Comment) (patient wishes to stop treatment)  Family Stress Factors Health changes  Advance Directives (For Healthcare)  Does Patient Have a Medical Advance Directive? Yes

## 2018-08-05 NOTE — Progress Notes (Signed)
OT Cancellation Note  Patient Details Name: Sharon [name] MRN: [id number] DOB: 23-Feb-1946   Cancelled Treatment:    Reason Eval/Treat Not Completed: Pt declining therapy. Per surgical note earlier today, pt choosing to transition to comfort care and hospice. Did not proceed with OT evaluation. Signing off.  Malka So 08/05/2018, 2:03 PM  Nestor Lewandowsky, OTR/L Acute Rehabilitation Services Pager: [phone] Office: [phone]

## 2018-08-05 NOTE — Progress Notes (Addendum)
Initial Nutrition Assessment  DOCUMENTATION CODES:   Obesity unspecified  INTERVENTION:   -TPN management per pharmacy -RD will follow for diet advancement and add supplements as appropriate  NUTRITION DIAGNOSIS:   Inadequate oral intake related to altered GI function as evidenced by NPO status.  GOAL:   Patient will meet greater than or equal to 90% of their needs  MONITOR:   Diet advancement, Labs, Weight trends, Skin, I & O's  REASON FOR ASSESSMENT:   Consult New TPN/TNA  ASSESSMENT:   Sharon [name] is a 73 y.o. female with medical history significant for chronic diastolic CHF, insulin-dependent diabetes mellitus, CVA, and possible atrial fibrillation seen on loop recorder, now presenting to the emergency department for evaluation of abdominal pain with nausea and nonbloody vomiting.  Patient reports that she had been in her usual state of health until she developed some constipation little less than a week ago, experienced some relief with a Fleet enema, but went on to develop pain in the lower abdomen and on the left.  Pain is described as waxing and waning, severe at times, not associated with melena or hematochezia, and no diarrhea.  She reports that symptoms are similar to when she has had acute diverticulitis.  She has not appreciated any fevers.  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   Hx obtained by pt's POA (niece [name]) at bedside. Pt lives with her sister-in-law (niece's mother), but [name] visits pt multiple times per week. She reports PTA, pt had a  great appetite. She often did not make healthy food choices, however, there have been no changes in food selections or intake. Per [name], pt has had long standing symptoms with reflux and nausea after eating meals, however, this has never impacted pt's ability to follow her usual diet pattern.   Pt has been mainly NPO this admission. Per [name], pt was consuming copious amount of liquids while on clear liquid diet prior to surgery, which caused extreme nausea, vomiting, and gagging (even on ice chips).   Pt has not experienced any weight loss. Noted a significant increase in wt (9.6% wt gain over the past 3 months), however suspect this is related to significant edema. [name] also denies that pt had experienced any changes in mobility. Of note pt had a stroke last year, but has been getting around at home without assistive devices, but uses a walker when going longer distances.   Pt remains NPO. She is receiving TPN via PICC- TPN currently infusing at 30 ml/hr, which provides 646 kcals and 40 grams protein daily (which meets 46% of estimated kcal needs and 42% of estimated protein needs). Per pharmacy notes, plan to advanced TPN to 45 ml/hr at 1800, which will provides 969 kcals and 60 grams protein, which meets 69% of estimated kcal needs and 63% of estimated protein needs). Discussed with POA how pt will receive nutrition at this time.    Last Hgb A1c: 8.7 (05/11/18) PTA DM medications are 80 units tresiba daily and 1.8 mg liraglutide daily).   Labs reviewed: K: 3.2, Mg and Phos WDL, CBGS: 159-192 (inpatient orders for glycemic  control are 0-15 units insulin aspart every 4 hours).   NUTRITION - FOCUSED PHYSICAL EXAM:    Most Recent Value  Orbital Region  No depletion  Upper Arm Region  No depletion  Thoracic and Lumbar Region  No depletion  Buccal Region  No depletion  Temple Region  No depletion  Clavicle Bone Region  No depletion  Clavicle and Acromion Bone Region  No depletion  Scapular Bone  Region  No depletion  Dorsal Hand  No depletion  Patellar Region  No depletion  Anterior Thigh Region  No depletion  Posterior Calf Region  No depletion  Edema (RD Assessment)  Moderate  Hair  Reviewed  Eyes  Reviewed  Mouth  Reviewed  Skin  Reviewed  Nails  Reviewed       Diet Order:   Diet Order            Diet NPO time specified Except for: Ice Chips, Sips with Meds  Diet effective now              EDUCATION NEEDS:   Education needs have been addressed  Skin:  Skin Assessment: Skin Integrity Issues: Skin Integrity Issues:: Incisions Incisions: closed abdomen   Last BM:  08/04/18 (via colostomy)  Height:   Ht Readings from Last 1 Encounters:  08/04/18 4\' 11"  (1.499 m)    Weight:   Wt Readings from Last 1 Encounters:  08/05/18 130.6 kg    Ideal Body Weight:  44.5 kg  BMI:  Body mass index is 58.15 kg/m.  Estimated Nutritional Needs:   Kcal:  1400-1600  Protein:  95-110 grams  Fluid:  > 1.4 L    Myldred Raju A. Jimmye Norman, RD, LDN, CDE Pager: [phone] After hours Pager: [phone]

## 2018-08-06 DIAGNOSIS — I5033 Acute on chronic diastolic (congestive) heart failure: Secondary | ICD-10-CM

## 2018-08-06 DIAGNOSIS — Z008 Encounter for other general examination: Secondary | ICD-10-CM

## 2018-08-06 DIAGNOSIS — K5732 Diverticulitis of large intestine without perforation or abscess without bleeding: Secondary | ICD-10-CM

## 2018-08-06 LAB — CBC
HCT: 29.5 % — ABNORMAL LOW (ref 36.0–46.0)
Hemoglobin: 9 g/dL — ABNORMAL LOW (ref 12.0–15.0)
MCH: 28.8 pg (ref 26.0–34.0)
MCHC: 30.5 g/dL (ref 30.0–36.0)
MCV: 94.2 fL (ref 80.0–100.0)
PLATELETS: 276 10*3/uL (ref 150–400)
RBC: 3.13 MIL/uL — ABNORMAL LOW (ref 3.87–5.11)
RDW: 15.1 % (ref 11.5–15.5)
WBC: 14.6 10*3/uL — ABNORMAL HIGH (ref 4.0–10.5)
nRBC: 0 % (ref 0.0–0.2)

## 2018-08-06 LAB — BASIC METABOLIC PANEL
Anion gap: 7 (ref 5–15)
BUN: 18 mg/dL (ref 8–23)
CO2: 20 mmol/L — ABNORMAL LOW (ref 22–32)
CREATININE: 1.12 mg/dL — AB (ref 0.44–1.00)
Calcium: 8.8 mg/dL — ABNORMAL LOW (ref 8.9–10.3)
Chloride: 116 mmol/L — ABNORMAL HIGH (ref 98–111)
GFR calc Af Amer: 58 mL/min — ABNORMAL LOW (ref >60)
GFR calc non Af Amer: 50 mL/min — ABNORMAL LOW (ref >60)
GLUCOSE: 402 mg/dL — AB (ref 70–99)
Potassium: 3.6 mmol/L (ref 3.5–5.1)
Sodium: 143 mmol/L (ref 135–145)

## 2018-08-06 LAB — PHOSPHORUS: Phosphorus: 3.5 mg/dL (ref 2.5–4.6)

## 2018-08-06 LAB — MAGNESIUM: Magnesium: 2 mg/dL (ref 1.7–2.4)

## 2018-08-06 LAB — HEPARIN LEVEL (UNFRACTIONATED): Heparin Unfractionated: 0.46 IU/mL (ref 0.30–0.70)

## 2018-08-06 MED ORDER — METOPROLOL TARTRATE 5 MG/5ML IV SOLN
5.0000 mg | Freq: Four times a day (QID) | INTRAVENOUS | Status: DC
  Administered 2018-08-07 – 2018-08-08 (×3): 5 mg via INTRAVENOUS
  Filled 2018-08-06 (×6): qty 5

## 2018-08-06 MED ORDER — MORPHINE SULFATE (PF) 2 MG/ML IV SOLN
1.0000 mg | INTRAVENOUS | Status: DC | PRN
  Administered 2018-08-07 – 2018-08-08 (×3): 1 mg via INTRAVENOUS
  Filled 2018-08-06 (×2): qty 1

## 2018-08-06 MED ORDER — HYDROMORPHONE 1 MG/ML IV SOLN
INTRAVENOUS | Status: DC
  Administered 2018-08-06: 6.79 mg via INTRAVENOUS
  Administered 2018-08-07: 1.76 mg via INTRAVENOUS
  Administered 2018-08-07: 1.14 mg via INTRAVENOUS
  Administered 2018-08-07: 1.46 mg via INTRAVENOUS
  Administered 2018-08-07: 25 mg via INTRAVENOUS
  Administered 2018-08-07: 1.6 mg via INTRAVENOUS
  Administered 2018-08-07: 2.85 mg via INTRAVENOUS
  Filled 2018-08-06: qty 25

## 2018-08-06 MED ORDER — TRAVASOL 10 % IV SOLN
INTRAVENOUS | Status: DC
  Filled 2018-08-06: qty 604.8

## 2018-08-06 NOTE — Consult Note (Signed)
[facility] Consult   Reason for Consult:  Capacity evaluation in the setting of depression  Referring Physician:  Dr. Ree Kida  Patient Identification: Sharon [name] MRN:  [id number] Principal Diagnosis: Evaluation by psychiatric service required Diagnosis:  Principal Problem:   Pneumatosis intestinalis Active Problems:   Type 2 diabetes mellitus without complication ([place])   Hypokalemia   Diabetes mellitus type 2, controlled (HCC)   Chronic diastolic CHF (congestive heart failure) (HCC)   AKI (acute kidney injury) ([place])   CVA (cerebral vascular accident) ([place])   Elevated LFTs   Aortic atherosclerosis ([place])   Total Time spent with patient: 1 hour  Subjective:   Sharon [name] is a 73 y.o. female patient admitted with nausea, vomiting and constipation.  HPI:   Per chart review, patient was admitted with nausea, vomiting and constipation and was found to have severe stricture of the colon. She underwent exploratory laparotomy with partial sigmoidectomy and end colostomy on 1/20. Her hospital course has been complicated by peri-operative atrial fibrillation/tachycardia and chronic diastolic CHF. She has been refusing medical care and reports that she is "done and is ready to go to her heavenly home." She does not meet hospice criteria since she is clinically improving. Patient and her niece agreed to continue the current course when speaking to palliative care on 1/23. She seemed to be confused during this interview. Today she refused a blood draw from her central line and TPN. She reports, "I need to go to heaven, I want to die." Her niece has been at bedside and requests for everything to be "taken out and turned off." She is prescribed Gabapentin 300 mg TID and Dilaudid PCA pump in the hospital. She is prescribed Paxil 80 mg daily but is not receiving this medication in the hospital.   On interview, Sharon [name] reports that she has a history of depression but it is not  influencing her current decisions regarding her medical care. Her sister was present at bedside with her verbal consent. Her sister also called her niece who is also her medical POA. Her niece reports that she created a will 6.5 years ago which listed her wishes for medical treatment. She is a DNR and does not want invasive treatments. Her niece reports that she was upset when she found out that her aunt had been taken to surgery and her DNR was also reversed. She reports that she could not consent to surgery at this time due to altered mental status.   Sharon [name] reports that she is upset that her treatment team keeps telling her that she is getting better. She reports that she is not any better since surgery. She does not know how to be any clearer than simply stating, "I can't take it anymore." She reports that she prefers to be put to sleep because she is tired of fighting. She reports that she knows what is best for her and not her treatment team. She reports, "this has been long and drawn out" when referring to her medical history. She reports that she has had GI problems for several years including small bowel obstructions. She is able to name her other medical conditions. She reports "everything" when asked about her medications. She denies SI, HI or AVH. She reports poor sleep and poor appetite secondary to nausea and vomiting. She reports a history of depression since her 41s. She was diagnosed with bipolar disorder. She reports a history of manic symptoms (decreased need for sleep, increased energy, pressured speech or  euphoria) in the past. She last felt manic years ago. Her symptoms would last up to 2 weeks. Her mood was better prior to hospitalization. She does not want to resume any medications at this time. She does intermittently appear to be confused and her sister has to clarify questions. She is oriented to person, place and time.   Past Psychiatric History: Depression and anxiety.   Risk  to Self:  None Risk to Others:  None  Prior Inpatient Therapy:  She was last hospitalized several years ago. She reports a history of suicide attempts.  Prior Outpatient Therapy:  Her medications are managed by her outpatient provider.   Past Medical History:  Past Medical History:  Diagnosis Date  . Anxiety   . Asthma   . CHF (congestive heart failure) ([place])   . Depression   . Diabetes mellitus   . GERD (gastroesophageal reflux disease)   . Heart murmur    echo- 04/2016- mild aortic stenosis  . History of kidney stones    passed  . Hypercholesteremia   . Hypertension   . Kidney stones    "last kidney numbers normal"  . Left ear hearing loss   . Osteoarthritis   . Seizures ([place]) 04/2016   due to Hyperosmolar Nonketotic hyperglycemia- is what it was thought to be cause by. No further seizure activity.  . Thrombophlebitis     Past Surgical History:  Procedure Laterality Date  . ABDOMINAL HYSTERECTOMY    . APPENDECTOMY    . BREAST BIOPSY Right    many yrs ago per pt; neg bx  . CARPAL TUNNEL RELEASE Bilateral   . CHOLECYSTECTOMY    . COLONOSCOPY    . COLONOSCOPY WITH PROPOFOL N/A 11/03/2017   Procedure: COLONOSCOPY WITH PROPOFOL;  Surgeon: Clarene Essex, MD;  Location: [facility];  Service: Endoscopy;  Laterality: N/A;  . COLONOSCOPY WITH PROPOFOL N/A 11/04/2017   Procedure: COLONOSCOPY WITH PROPOFOL;  Surgeon: Clarene Essex, MD;  Location: [facility];  Service: Endoscopy;  Laterality: N/A;  . COLOSTOMY N/A 08/02/2018   Procedure: end COLOSTOMY;  Surgeon: Ileana Roup, MD;  Location: [facility];  Service: General;  Laterality: N/A;  . ESOPHAGOGASTRODUODENOSCOPY (EGD) WITH PROPOFOL N/A 11/03/2017   Procedure: ESOPHAGOGASTRODUODENOSCOPY (EGD) WITH PROPOFOL;  Surgeon: Clarene Essex, MD;  Location: [facility];  Service: Endoscopy;  Laterality: N/A;  . ESOPHAGOSCOPY    . EYE SURGERY Bilateral    Cataract with lens  . FLEXIBLE SIGMOIDOSCOPY N/A 07/30/2018   Procedure: FLEXIBLE  SIGMOIDOSCOPY;  Surgeon: Otis Brace, MD;  Location: [facility];  Service: Gastroenterology;  Laterality: N/A;  . FLEXIBLE SIGMOIDOSCOPY N/A 07/31/2018   Procedure: FLEXIBLE SIGMOIDOSCOPY;  Surgeon: Wonda Horner, MD;  Location: [facility] ENDOSCOPY;  Service: Endoscopy;  Laterality: N/A;  . INCISION AND DRAINAGE PERIRECTAL ABSCESS    . LAPAROTOMY N/A 08/02/2018   Procedure: EXPLORATORY LAPAROTOMY;  Surgeon: Ileana Roup, MD;  Location: [facility];  Service: General;  Laterality: N/A;  . LOOP RECORDER INSERTION N/A 05/12/2018   Procedure: LOOP RECORDER INSERTION;  Surgeon: Thompson Grayer, MD;  Location: [facility];  Service: Cardiovascular;  Laterality: N/A;  . LYSIS OF ADHESION N/A 08/02/2018   Procedure: LYSIS OF ADHESION;  Surgeon: Ileana Roup, MD;  Location: [facility];  Service: General;  Laterality: N/A;  . PARTIAL COLECTOMY N/A 08/02/2018   Procedure: PARTIAL COLECTOMYsigmoid;  Surgeon: Ileana Roup, MD;  Location: [facility];  Service: General;  Laterality: N/A;  . pyelogram    . TEE  WITHOUT CARDIOVERSION N/A 05/12/2018   Procedure: TRANSESOPHAGEAL ECHOCARDIOGRAM (TEE);  Surgeon: Buford Dresser, MD;  Location: [facility] ENDOSCOPY;  Service: Cardiovascular;  Laterality: N/A;   Family History:  Family History  Problem Relation Age of Onset  . Heart attack Mother        8 HEART ATTACKS  . Stroke Mother        3 STROKES  . Heart attack Father   . Stroke Father    Family Psychiatric  History: Denies  Social History:  Social History   Substance and Sexual Activity  Alcohol Use Not Currently  . Frequency: Never   Comment: maybe 1 drink every few months     Social History   Substance and Sexual Activity  Drug Use No    Social History   Socioeconomic History  . Marital status: Single    Spouse name: Not on file  . Number of children: Not on file  . Years of education: Not on file  . Highest education level: Not on file  Occupational History  . Not on  file  Social Needs  . Financial resource strain: Not on file  . Food insecurity:    Worry: Not on file    Inability: Not on file  . Transportation needs:    Medical: Not on file    Non-medical: Not on file  Tobacco Use  . Smoking status: Never Smoker  . Smokeless tobacco: Never Used  Substance and Sexual Activity  . Alcohol use: Not Currently    Frequency: Never    Comment: maybe 1 drink every few months  . Drug use: No  . Sexual activity: Not on file  Lifestyle  . Physical activity:    Days per week: Not on file    Minutes per session: Not on file  . Stress: Not on file  Relationships  . Social connections:    Talks on phone: Not on file    Gets together: Not on file    Attends religious service: Not on file    Active member of club or organization: Not on file    Attends meetings of clubs or organizations: Not on file    Relationship status: Not on file  Other Topics Concern  . Not on file  Social History Narrative  . Not on file   Additional Social History: She lives at home with her sister. 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 She denies alcohol or illicit substance use.     Allergies:   Allergies  Allergen Reactions  . Crestor [Rosuvastatin] Other (See Comments)    Myalgias  . Indomethacin Other (See Comments)    dizziness  . Invanz [Ertapenem Sodium] Hives  . Lipitor [Atorvastatin Calcium] Other (See Comments)    Myalgias   . Reglan [Metoclopramide] Other (See Comments)    TREMORS    Labs:  Results for orders placed or performed during the hospital encounter of 07/26/18 (from the past 48 hour(s))  Basic metabolic panel     Status: Abnormal   Collection Time: 08/04/18  3:38 PM  Result Value Ref Range   Sodium 146 (H) 135 - 145 mmol/L   Potassium 3.5 3.5 - 5.1 mmol/L   Chloride 121 (H) 98 - 111 mmol/L   CO2 19 (L) 22 - 32 mmol/L   Glucose, Bld 135 (H) 70 - 99 mg/dL   BUN 13 8 - 23 mg/dL   Creatinine, Ser 1.08  (H) 0.44 -  1.00 mg/dL   Calcium 8.9 8.9 - 10.3 mg/dL   GFR calc non Af Amer 52 (L) >60 mL/min   GFR calc Af Amer >60 >60 mL/min   Anion gap 6 5 - 15    Comment: Performed at [facility] [address], [place], [place] [address]  Glucose, capillary     Status: Abnormal   Collection Time: 08/04/18  4:01 PM  Result Value Ref Range   Glucose-Capillary 130 (H) 70 - 99 mg/dL  Heparin level (unfractionated)     Status: Abnormal   Collection Time: 08/04/18  8:00 PM  Result Value Ref Range   Heparin Unfractionated <0.10 (L) 0.30 - 0.70 IU/mL    Comment: (NOTE) If heparin results are below expected values, and patient dosage has  been confirmed, suggest follow up testing of antithrombin III levels. Performed at [facility], [place] [address], [place], [place] [address]   Glucose, capillary     Status: Abnormal   Collection Time: 08/04/18  8:20 PM  Result Value Ref Range   Glucose-Capillary 154 (H) 70 - 99 mg/dL  Glucose, capillary     Status: Abnormal   Collection Time: 08/04/18 11:55 PM  Result Value Ref Range   Glucose-Capillary 159 (H) 70 - 99 mg/dL   Comment 1 Notify RN    Comment 2 Document in Chart   Glucose, capillary     Status: Abnormal   Collection Time: 08/05/18  3:44 AM  Result Value Ref Range   Glucose-Capillary 192 (H) 70 - 99 mg/dL   Comment 1 Notify RN    Comment 2 Document in Chart   CBC     Status: Abnormal   Collection Time: 08/05/18  4:25 AM  Result Value Ref Range   WBC 16.4 (H) 4.0 - 10.5 K/uL   RBC 3.25 (L) 3.87 - 5.11 MIL/uL   Hemoglobin 9.5 (L) 12.0 - 15.0 g/dL   HCT 30.5 (L) 36.0 - 46.0 %   MCV 93.8 80.0 - 100.0 fL   MCH 29.2 26.0 - 34.0 pg   MCHC 31.1 30.0 - 36.0 g/dL   RDW 15.0 11.5 - 15.5 %   Platelets 292 150 - 400 K/uL   nRBC 0.0 0.0 - 0.2 %    Comment: Performed at [facility], [place]. [address] [place], [place] [address]  Comprehensive metabolic panel     Status: Abnormal   Collection Time: 08/05/18  4:25 AM  Result Value  Ref Range   Sodium 144 135 - 145 mmol/L   Potassium 3.2 (L) 3.5 - 5.1 mmol/L   Chloride 118 (H) 98 - 111 mmol/L   CO2 19 (L) 22 - 32 mmol/L   Glucose, Bld 202 (H) 70 - 99 mg/dL   BUN 15 8 - 23 mg/dL   Creatinine, Ser 1.06 (H) 0.44 - 1.00 mg/dL   Calcium 8.9 8.9 - 10.3 mg/dL   Total Protein 5.3 (L) 6.5 - 8.1 g/dL   Albumin 2.1 (L) 3.5 - 5.0 g/dL   AST 10 (L) 15 - 41 U/L   ALT 14 0 - 44 U/L   Alkaline Phosphatase 71 38 - 126 U/L   Total Bilirubin 0.4 0.3 - 1.2 mg/dL   GFR calc non Af Amer 53 (L) >60 mL/min   GFR calc Af Amer >60 >60 mL/min   Anion gap 7 5 - 15    Comment: Performed at [facility], [place] [address] [place], [place] [address]  Prealbumin  Status: Abnormal   Collection Time: 08/05/18  4:25 AM  Result Value Ref Range   Prealbumin 5.6 (L) 18 - 38 mg/dL    Comment: Performed at [facility] [address], [place], [place] [address]  Magnesium     Status: None   Collection Time: 08/05/18  4:25 AM  Result Value Ref Range   Magnesium 1.8 1.7 - 2.4 mg/dL    Comment: Performed at [facility] [address], [place], [place] [address]  Phosphorus     Status: None   Collection Time: 08/05/18  4:25 AM  Result Value Ref Range   Phosphorus 4.2 2.5 - 4.6 mg/dL    Comment: Performed at [facility] [address], [place], [place] [address]  Triglycerides     Status: Abnormal   Collection Time: 08/05/18  4:25 AM  Result Value Ref Range   Triglycerides 158 (H) <150 mg/dL    Comment: Performed at [facility] [address] [place], [place] [address]  Differential     Status: Abnormal   Collection Time: 08/05/18  4:25 AM  Result Value Ref Range   Neutrophils Relative % 83 %   Neutro Abs 13.6 (H) 1.7 - 7.7 K/uL   Lymphocytes Relative 7 %   Lymphs Abs 1.1 0.7 - 4.0 K/uL   Monocytes Relative 4 %   Monocytes Absolute 0.7 0.1 - 1.0 K/uL   Eosinophils Relative 5 %   Eosinophils Absolute 0.8 (H) 0.0 - 0.5 K/uL   Basophils Relative 0 %    Basophils Absolute 0.1 0.0 - 0.1 K/uL   Immature Granulocytes 1 %   Abs Immature Granulocytes 0.14 (H) 0.00 - 0.07 K/uL    Comment: Performed at [facility] [address], [place], [place] [address]  Heparin level (unfractionated)     Status: Abnormal   Collection Time: 08/05/18  6:00 AM  Result Value Ref Range   Heparin Unfractionated 0.15 (L) 0.30 - 0.70 IU/mL    Comment: (NOTE) If heparin results are below expected values, and patient dosage has  been confirmed, suggest follow up testing of antithrombin III levels. Performed at [facility], [place] [address], [place], [place] [address]   Glucose, capillary     Status: Abnormal   Collection Time: 08/05/18  7:41 AM  Result Value Ref Range   Glucose-Capillary 177 (H) 70 - 99 mg/dL  Glucose, capillary     Status: Abnormal   Collection Time: 08/05/18 11:46 AM  Result Value Ref Range   Glucose-Capillary 245 (H) 70 - 99 mg/dL  Basic metabolic panel     Status: Abnormal   Collection Time: 08/05/18  8:14 PM  Result Value Ref Range   Sodium 142 135 - 145 mmol/L   Potassium 3.7 3.5 - 5.1 mmol/L   Chloride 115 (H) 98 - 111 mmol/L   CO2 18 (L) 22 - 32 mmol/L   Glucose, Bld 349 (H) 70 - 99 mg/dL   BUN 17 8 - 23 mg/dL   Creatinine, Ser 1.22 (H) 0.44 - 1.00 mg/dL   Calcium 8.8 (L) 8.9 - 10.3 mg/dL   GFR calc non Af Amer 45 (L) >60 mL/min   GFR calc Af Amer 52 (L) >60 mL/min   Anion gap 9 5 - 15    Comment: Performed at [facility] [address], [place], [place] [address]  Heparin level (unfractionated)     Status: None   Collection  Time: 08/05/18  8:14 PM  Result Value Ref Range   Heparin Unfractionated 0.52 0.30 - 0.70 IU/mL    Comment: (NOTE) If heparin results are below expected values, and patient dosage has  been confirmed, suggest follow up testing of antithrombin III levels. Performed at [facility], [place] [address], [place], [place] [address]   CBC     Status: Abnormal   Collection Time:  08/06/18  5:09 AM  Result Value Ref Range   WBC 14.6 (H) 4.0 - 10.5 K/uL   RBC 3.13 (L) 3.87 - 5.11 MIL/uL   Hemoglobin 9.0 (L) 12.0 - 15.0 g/dL   HCT 29.5 (L) 36.0 - 46.0 %   MCV 94.2 80.0 - 100.0 fL   MCH 28.8 26.0 - 34.0 pg   MCHC 30.5 30.0 - 36.0 g/dL   RDW 15.1 11.5 - 15.5 %   Platelets 276 150 - 400 K/uL   nRBC 0.0 0.0 - 0.2 %    Comment: Performed at [facility], [place] [address], [place], [place] [address]  Heparin level (unfractionated)     Status: None   Collection Time: 08/06/18  5:09 AM  Result Value Ref Range   Heparin Unfractionated 0.46 0.30 - 0.70 IU/mL    Comment: (NOTE) If heparin results are below expected values, and patient dosage has  been confirmed, suggest follow up testing of antithrombin III levels. Performed at [facility], [place] [address] [place], [place] [address]   Basic metabolic panel     Status: Abnormal   Collection Time: 08/06/18  5:09 AM  Result Value Ref Range   Sodium 143 135 - 145 mmol/L   Potassium 3.6 3.5 - 5.1 mmol/L   Chloride 116 (H) 98 - 111 mmol/L   CO2 20 (L) 22 - 32 mmol/L   Glucose, Bld 402 (H) 70 - 99 mg/dL   BUN 18 8 - 23 mg/dL   Creatinine, Ser 1.12 (H) 0.44 - 1.00 mg/dL   Calcium 8.8 (L) 8.9 - 10.3 mg/dL   GFR calc non Af Amer 50 (L) >60 mL/min   GFR calc Af Amer 58 (L) >60 mL/min   Anion gap 7 5 - 15    Comment: Performed at [facility] [address], [place], [place] [address]  Magnesium     Status: None   Collection Time: 08/06/18  5:09 AM  Result Value Ref Range   Magnesium 2.0 1.7 - 2.4 mg/dL    Comment: Performed at [facility] [address], [place], [place] [address]  Phosphorus     Status: None   Collection Time: 08/06/18  5:09 AM  Result Value Ref Range   Phosphorus 3.5 2.5 - 4.6 mg/dL    Comment: Performed at [facility] [address], [place], [place] [address]    Current Facility-Administered Medications  Medication Dose Route Frequency Provider Last Rate Last  Dose  . 0.9 %  sodium chloride infusion   Intravenous PRN Saverio Danker, PA-C 0 mL/hr at 07/30/18 0109    . acetaminophen (TYLENOL) tablet 1,000 mg  1,000 mg Oral Q6H Saverio Danker, PA-C   1,000 mg at 08/04/18 3235  . diphenhydrAMINE (BENADRYL) injection 12.5 mg  12.5 mg Intravenous Q6H PRN Danford, Suann Larry, MD       Or  . diphenhydrAMINE (BENADRYL) 12.5 MG/5ML elixir 12.5 mg  12.5 mg Oral Q6H PRN Danford, Suann Larry, MD      . gabapentin (NEURONTIN)  capsule 300 mg  300 mg Oral TID Saverio Danker, PA-C      . heparin ADULT infusion 100 units/mL (25000 units/241mL sodium chloride 0.45%)  1,850 Units/hr Intravenous Continuous Einar Grad, RPH 18.5 mL/hr at 08/06/18 1200 1,850 Units/hr at 08/06/18 1200  . hydrALAZINE (APRESOLINE) injection 10 mg  10 mg Intravenous Q4H PRN Saverio Danker, PA-C   10 mg at 08/05/18 2136  . HYDROmorphone (DILAUDID) 1 mg/mL PCA injection   Intravenous Q4H Mikhail, Maryann, DO      . methocarbamol (ROBAXIN) 500 mg in dextrose 5 % 50 mL IVPB  500 mg Intravenous TID Saverio Danker, PA-C 100 mL/hr at 08/06/18 0600 500 mg at 08/06/18 0600  . metoprolol tartrate (LOPRESSOR) injection 5 mg  5 mg Intravenous Q5 min PRN Dessa Phi, DO      . morphine 2 MG/ML injection 1 mg  1 mg Intravenous Q4H PRN Mikhail, Velta Addison, DO      . naloxone [facility]) injection 0.4 mg  0.4 mg Intravenous PRN Danford, Suann Larry, MD       And  . sodium chloride flush (NS) 0.9 % injection 9 mL  9 mL Intravenous PRN Danford, Suann Larry, MD      . ondansetron (ZOFRAN) injection 4 mg  4 mg Intravenous Q6H PRN Edwin Dada, MD   4 mg at 08/05/18 2130  . ondansetron (ZOFRAN) tablet 4 mg  4 mg Oral Q4H PRN Saverio Danker, PA-C      . prochlorperazine (COMPAZINE) injection 10 mg  10 mg Intravenous Q8H PRN Edwin Dada, MD   10 mg at 08/05/18 1657  . promethazine (PHENERGAN) injection 12.5 mg  12.5 mg Intravenous Q4H PRN Asencion Gowda, NP   12.5 mg at 08/05/18 1431   . sodium chloride flush (NS) 0.9 % injection 10-40 mL  10-40 mL Intracatheter Q12H Loistine Chance, MD   10 mL at 08/05/18 2118  . sodium chloride flush (NS) 0.9 % injection 10-40 mL  10-40 mL Intracatheter PRN Loistine Chance, MD      . TPN ADULT (ION)   Intravenous Continuous TPN Reginia Naas, [facility]        Musculoskeletal: Strength & Muscle Tone: Generalized weakness. Gait & Station: unable to stand Patient leans: N/A  Psychiatric Specialty Exam: Physical Exam  Nursing note and vitals reviewed. Constitutional: She is oriented to person, place, and time. She appears well-developed and well-nourished.  HENT:  Head: Normocephalic and atraumatic.  Neck: Normal range of motion.  Respiratory: Effort normal.  Musculoskeletal: Normal range of motion.  Neurological: She is alert and oriented to person, place, and time.  Psychiatric: Her behavior is normal. Judgment and thought content normal. Her speech is delayed. Cognition and memory are impaired. She exhibits a depressed mood.    Review of Systems  Gastrointestinal: Positive for nausea and vomiting.  Psychiatric/Behavioral: Positive for depression. Negative for hallucinations, substance abuse and suicidal ideas. The patient has insomnia.   All other systems reviewed and are negative.   Blood pressure (!) 150/66, pulse (!) 118, temperature 97.8 F (36.6 C), temperature source Oral, resp. rate 15, height 4\' 11"  (1.499 m), weight 125.6 kg, SpO2 100 %.Body mass index is 55.93 kg/m.  General Appearance: Fairly Groomed, morbidly obese, elderly, Caucasian female, wearing a hospital gown who is lying in bed. NAD.   Eye Contact:  Fair  Speech:  Clear and Coherent and Slow  Volume:  Normal  Mood:  Depressed  Affect:  Congruent  Thought Process:  Goal Directed,  Linear and Descriptions of Associations: Intact  Orientation:  Full (Time, Place, and Person)  Thought Content:  Logical  Suicidal Thoughts:  No  Homicidal Thoughts:  No  Memory:   Immediate;   Fair Recent;   Fair Remote;   Fair  Judgement:  Fair  Insight:  Fair  Psychomotor Activity:  Normal  Concentration:  Concentration: Fair and Attention Span: Fair  Recall:  [employer] of Knowledge:  Fair  Language:  Good  Akathisia:  No  Handed:  Right  AIMS (if indicated):   N/A  Assets:  Communication Skills Housing Social Support  ADL's:  Impaired  Cognition:  Impaired with difficulty comprehending information at times.  Sleep:   Poor   Assessment:  Yailen Zemaitis is a 73 y.o. female who was admitted with nausea, vomiting and constipation and was found to have severe stricture of the colon. She underwent exploratory laparotomy with partial sigmoidectomy and end colostomy on 1/20. Psychiatry was consulted to assess capacity to refuse treatment due to concern for depression impairing judgement. Patient continues to request comfort care due. She has a history of chronic medical conditions and therefore feels mentally and physically exhausted. She denies that depression influences these wishes and in fact reports making a will 6.5 years ago to document her wishes which her sister and niece corroborate. It appears that she has been consistent with her wishes for medical care (remain DNR with no invasive treatments) per family. Patient is refusing simple care such as insulin and lab draws even though she has demonstrated clinical improvement. Her refusal may be due to multiple factors including cognitive impairment and frustration with recent care. She demonstrates difficulty with comprehending information throughout interview and likely will continue to need her POA (niece) to assist with making medical decisions. Ethics will likely need to be involved due to such a sensitive issue (and also niece reportedly agreed with recommended treatment earlier so there may be conflicting information).    Treatment Plan Summary: -Please see assessment above regarding capacity to refuse care.   -May be beneficial for patient to resume Paxil for mood. Consider discussing this with patient.   -EKG reviewed and QTc 449 on 1/13. Please closely monitor when starting or increasing QTc prolonging agents.  -Psychiatry will sign off on patient at this time. Please consult psychiatry again as needed.   Disposition: Patient does not meet criteria for psychiatric inpatient admission.  Faythe Dingwall, DO 08/06/2018 12:32 PM

## 2018-08-06 NOTE — Care Management Important Message (Signed)
Important Message  Patient Details  Name: Sharon [name] MRN: [id number] Date of Birth: [birth year]   Medicare Important Message Given:  Yes    [name] 08/06/2018, 10:55 AM

## 2018-08-06 NOTE — Care Management Note (Signed)
Case Management Note  Patient Details  Name: Jannell Franta MRN: [id number] Date of Birth: 01-08-1946  Subjective/Objective:     Pt now s/p exp lap - new colostomy             Action/Plan:   PTA from home.  Palliative consulted.     Expected Discharge Date:                  Expected Discharge Plan:  Skilled Nursing Facility  In-House Referral:  Clinical Social Work  Discharge planning Services  CM Consult  Post Acute Care Choice:    Choice offered to:     DME Arranged:    DME Agency:     HH Arranged:    [place] Agency:     Status of Service:  In process, will continue to follow  If discussed at Long Length of Stay Meetings, dates discussed:    Additional Comments: Pt requiring TPN, has colostomy, persistent ileus and JP drain.  Attending does not feel pt is appropriate for [facility] referral at this time.  CM will continue to follow Maryclare Labrador, RN 08/06/2018, 4:47 PM

## 2018-08-06 NOTE — Progress Notes (Addendum)
Pt. Refused blood drawing from central line for glucose. Claimed "I don't  want anything to be done on me. I'm tired I don't want TPN ,  I need to go to heaven, I want to die."  Niece also at the beside who claimed " I don't know why they don't listen to her wish." MD made aware .

## 2018-08-06 NOTE — Progress Notes (Addendum)
Progress Note  Patient Name: Sharon [name] Date of Encounter: 08/06/2018  Primary Cardiologist: Candee Furbish, MD   Subjective   No significant overnight events. Patient seems very discouraged. She states everything hurts. Continues to have nausea and abdominal pain. Also reports some chest pain but states this is not new for her. Would not elaborate on the chest pain. Breathing is stable.  Inpatient Medications    Scheduled Meds: . acetaminophen  1,000 mg Oral Q6H  . gabapentin  300 mg Oral TID  . HYDROmorphone   Intravenous Q4H  . sodium chloride flush  10-40 mL Intracatheter Q12H   Continuous Infusions: . sodium chloride 0 mL/hr at 07/30/18 0508  . heparin 1,850 Units/hr (08/06/18 1000)  . methocarbamol (ROBAXIN) IV 500 mg (08/06/18 0600)  . TPN ADULT (ION)     PRN Meds: sodium chloride, diphenhydrAMINE **OR** diphenhydrAMINE, hydrALAZINE, metoprolol tartrate, morphine injection, naloxone **AND** sodium chloride flush, ondansetron (ZOFRAN) IV, ondansetron **OR** [DISCONTINUED] ondansetron (ZOFRAN) IV, prochlorperazine, promethazine, sodium chloride flush   Vital Signs    Vitals:   08/06/18 0415 08/06/18 0632 08/06/18 0800 08/06/18 0859  BP:    (!) 150/66  Pulse:      Resp: 14  15   Temp:    97.8 F (36.6 C)  TempSrc:    Oral  SpO2: 98%  100%   Weight:  125.6 kg    Height:        Intake/Output Summary (Last 24 hours) at 08/06/2018 1025 Last data filed at 08/06/2018 1000 Gross per 24 hour  Intake 1483.14 ml  Output 2875 ml  Net -1391.86 ml   Last 3 Weights 08/06/2018 08/05/2018 08/04/2018  Weight (lbs) 276 lb 14.4 oz 287 lb 14.7 oz 287 lb 11.2 oz  Weight (kg) 125.6 kg 130.6 kg 130.5 kg      Telemetry    Sinus rhythm. Baseline rate seems to be in the 80's but continues to have frequent spikes in the 110's to 130's (atrial tach, afib) PVCs also noted.  - Personally Reviewed  ECG    No new ECG tracing today. - Personally Reviewed  Physical Exam   GEN:  Morbidly obese Caucasian female. Alert and in no acute distress.   Neck: Neck is full. Cardiac: RRR. II/VI systolic murmur best heart at upper sternal border. No rubs or gallops appreciated. Respiratory: Rel clear anteior  GI: Tender with minimal palpation   MS: Tr t o1+ edema of upper and lower extremities. deformity. Neuro:  No focal deficits. Psych: Patient seems very discouraged. States she wishes she did not have the abdominal surgery.  Labs    Chemistry Recent Labs  Lab 08/05/18 0425 08/05/18 2014 08/06/18 0509  NA 144 142 143  K 3.2* 3.7 3.6  CL 118* 115* 116*  CO2 19* 18* 20*  GLUCOSE 202* 349* 402*  BUN 15 17 18   CREATININE 1.06* 1.22* 1.12*  CALCIUM 8.9 8.8* 8.8*  PROT 5.3*  --   --   ALBUMIN 2.1*  --   --   AST 10*  --   --   ALT 14  --   --   ALKPHOS 71  --   --   BILITOT 0.4  --   --   GFRNONAA 53* 45* 50*  GFRAA >60 52* 58*  ANIONGAP 7 9 7      Hematology Recent Labs  Lab 08/04/18 0330 08/05/18 0425 08/06/18 0509  WBC 14.5* 16.4* 14.6*  RBC 2.96* 3.25* 3.13*  HGB 8.8* 9.5* 9.0*  HCT 27.8* 30.5* 29.5*  MCV 93.9 93.8 94.2  MCH 29.7 29.2 28.8  MCHC 31.7 31.1 30.5  RDW 14.6 15.0 15.1  PLT 244 292 276    Cardiac EnzymesNo results for input(s): TROPONINI in the last 168 hours. No results for input(s): TROPIPOC in the last 168 hours.   BNPNo results for input(s): BNP, PROBNP in the last 168 hours.   DDimer No results for input(s): DDIMER in the last 168 hours.   Radiology    Dg Chest Port 1 View  Result Date: 08/04/2018 CLINICAL DATA:  Status post central line placement EXAM: PORTABLE CHEST 1 VIEW COMPARISON:  07/27/2018 FINDINGS: Cardiac shadow is enlarged but stable. Lungs are well aerated bilaterally. No pneumothorax is noted following central line placement on the right. Catheter tip is noted at the cavoatrial junction. No infiltrate is noted. IMPRESSION: No pneumothorax following central line placement. Electronically Signed   By: Inez Catalina  M.D.   On: 08/04/2018 15:30    Cardiac Studies   Echocardiogram 05/11/2018: Study Conclusions: - Left ventricle: The cavity size was normal. There was mild focal   basal hypertrophy of the septum. Systolic function was normal.   The estimated ejection fraction was in the range of 60% to 65%.   Wall motion was normal; there were no regional wall motion   abnormalities. There was an increased relative contribution of   atrial contraction to ventricular filling. Doppler parameters are   consistent with abnormal left ventricular relaxation (grade 1   diastolic dysfunction). Doppler parameters are consistent with   high ventricular filling pressure. - Aortic valve: There was mild stenosis. Mean gradient (S): 15 mm   Hg. Valve area (VTI): 1.27 cm^2. Valve area (Vmax): 1.22 cm^2.   Valve area (Vmean): 1.38 cm^2. - Mitral valve: Calcified annulus. Mildly calcified leaflets . - Pulmonary arteries: Systolic pressure could not be accurately   estimated. - Inferior vena cava: The vessel was mildly dilated.  Patient Profile   Ms. Sharon [name] is a 73 y.o. female with a chronic diastolic CHF with EF of 67-89% on Echo in 04/2018, chest pain with normal Myoview in 2016, mild aortic stenosis, diabetes mellitus, CVA, and possible atrial fibrillation (s/p insertion of loop recorder). She was admitted on 07/26/2018 with abdominal pain, vomiting, and constipation. CT showed pneumatosis of the colon. 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 Cardiology was consulted for pre-operative evaluation. Patient underwent exploratory laparotomy with partial sigmoidectomy and end colostomy on 08/02/2018.   Assessment & Plan    Rhythm   Atrial Fibrillation / Tachycardia - Pt continues to have bursts of atrial fib , atrial tachy   SOme longer lived  ON heparin  BP is high   I would add scheduled IV metoprolol to rx both   Would need NOAC after recovers, taking PO    Acute on chronic diastlolic CHF Pt with  volume increase on exam   She ahs been getting IV lasix mfrom medicine    WOuld continue to follow I/O and dose as needed       Depression Pt is saying she does not want any more done   Wants to pull back from current level of Rx    I did not observe this level of discouragment/depression last weekend or at beginning of week   Note psychiatry has been called to evaluate   Will be available as needed   Please call for help.  For questions or updates, please contact [place] Please consult [email or web address] for  contact info under        Signed, Darreld Mclean, PA-C  08/06/2018, 10:25 AM

## 2018-08-06 NOTE — Progress Notes (Addendum)
PHARMACY - ADULT TOTAL PARENTERAL NUTRITION CONSULT NOTE   Pharmacy Consult:  TPN Indication:  Colonic stricture with partial obstruction  Patient Measurements: Height: 4\' 11"  (149.9 cm) Weight: 276 lb 14.4 oz (125.6 kg) IBW/kg (Calculated) : 43.2 TPN AdjBW (KG): 61.1 Body mass index is 55.93 kg/m.  Assessment:  23 YOF presented on 07/26/18 with abdominal pain, nausea and vomiting, and found to have pneumatosis.  Sigmoidoscopy on 07/30/18 showed colonic stricture and on 07/31/18 showed diverticulosis.  Patient underwent ex-lap with LoA, partial sigmoidectomy and end colostomy on 08/02/18.  Pharmacy consulted to initiate TPN given inadequate nutrition since admit.   GI: hx GERD.  BL prealbumin low at 5.6.  Drain O/P low at 68mL. Having stool output. Ileus seems to be resolved. At this time, the providers want to continue current TPN nutrition while GOC are being discusses with patient and family. PRN Zofran Endo: DM on Tresiba, Novolog and Victoza PTA.  Was on Lantus 35/d prior to TPN. CBGs significantly trending up to 400s as patient is now refusing CBG sticks and SSI. Unsure of 24 hr requirements at this time.  *I went and discussed the importance of glucose management with the patient and niece this morning at bedside. Patient is adamant she does not want SSI or CBG checks. Providers want to continue nutrition for now while psych sees the patient and helps determine the overall plan. This makes TPN ordering incredibly difficult as we try and prevent hyper and hypoglycemic events. She also refuses starting an insulin gtt and really does not want to be on TPN at this time. If she is competent and found that depression is not clouding her judgement we should respect her wishes of no TPN, if she is not able to make her own medical decisions then the providers and POA should make her medical decisions.* Insulin requirements in the past 24 hours: 8 units, then refusing Lytes: Cl elevated at 116, CO2 low  but up to 20. K up to 3.6, other lytes wnl.  Renal: SCr 1.12, BUNwnl - UOP good Pulm: stable on RA Cards: CHF/Afib/HLD - BP improving, HR controlled - PRN hydralazine, Lasix 60mg  IV on 1/22 PM AC: Heparin for Afib - hgb 9.5, plts WNL Hepatobil: LFTs / tbili normalized.  TG mildly elevated at 158. Neuro: CVA/depression/seizure - APAP, Robaxin, PRN Dilaudid ID: [email or web address], WBC up to 16.4 - not on abx TPN Access: CVC double lumen placed 08/02/18 TPN start date: 08/04/18  Nutritional Goals (RD rec on 1/23): 1400-1600 kCal and 95-110gm protein per day  Current Nutrition:  TPN  Plan:   Hold current TPN bag due to hyperglycemia and no way to correct since patient refusing insulin Will restart TPN tonight at 26ml/hr (goal rate 35ml/hr) TPN will provide 60g of protein, 130g of dextrose, and 39 g of lipids for a total of 969 kCal, meeting ~65% of patients needs Electrolytes in TPN: increased K, reduced Na, Ca. Others standard concentration, max acetate Daily multivitamin and trace elements in TPN Stop SSI and CBG checks as patient will not use Increase regular insulin in TPN to 60 units  Advancing to clear liquids Monitor central line CBGs, TPN labs Follow up [place] recommendations by primary, psych, and palliative   Elenor Quinones, PharmD, BCPS, [facility] Clinical Pharmacist Phone number [phone] 08/06/2018 7:35 AM

## 2018-08-06 NOTE — Progress Notes (Addendum)
Daily Progress Note   Patient Name: Sharon [name]       Date: 08/06/2018 DOB: March 06, 1946  Age: 73 y.o. MRN#: [id number] Attending Physician: Cristal Ford, DO Primary [name] Physician: [name], MD Admit Date: 07/26/2018  Reason for Consultation/Follow-up: Establishing goals of [name]  Subjective: Patient is resting in bed, sister at bedside. Upon asking Sharon [name] how she is doing, she did not answer and then upon asking if she felt any better she said "no". She has been refusing [name] and verbalizing a desire to die. Today she stated to me "can't we hurry this up and get it over already". I explained that I wanted to be sure she and her family understood the plan and she stated "they do!"   After extensive review of the chart and team collaboration, Sharon [name], and does not meet criteria for hospice or end of life [name].   She has been very clear she does not want any further [name], and would not discuss a discharge plan or what discharge would look like yesterday, only that she wants to go home to heaven, so concern for ability to make complex decisions. Recommend psychiatric evaluation at this time as she has a history of treated depression per her niece and POA. Patient was updated she does not meet criteria for hospice.    Palliative medicine team will sign off at this time.    Length of Stay: 10  Current Medications: Scheduled Meds:  . acetaminophen  1,000 mg Oral Q6H  . gabapentin  300 mg Oral TID  . HYDROmorphone   Intravenous Q4H  . metoprolol tartrate  5 mg Intravenous Q6H  . sodium chloride flush  10-40 mL Intracatheter Q12H    Continuous Infusions: . sodium chloride 0 mL/hr at 07/30/18 0508  . heparin 1,850 Units/hr  (08/06/18 1300)  . methocarbamol (ROBAXIN) IV 500 mg (08/06/18 0600)  . TPN ADULT (ION)      PRN Meds: sodium chloride, diphenhydrAMINE **OR** diphenhydrAMINE, hydrALAZINE, metoprolol tartrate, morphine injection, naloxone **AND** sodium chloride flush, ondansetron (ZOFRAN) IV, ondansetron **OR** [DISCONTINUED] ondansetron (ZOFRAN) IV, prochlorperazine, promethazine, sodium chloride flush  Physical Exam Pulmonary:     Effort: Pulmonary effort is normal.  Neurological:     Mental Status: She is alert.  Vital Signs: BP (!) 150/66 (BP Location: Left Wrist)   Pulse (!) 118   Temp 97.8 F (36.6 C) (Oral)   Resp 15   Ht 4\' 11"  (1.499 m)   Wt 125.6 kg   LMP  (LMP Unknown)   SpO2 100%   BMI 55.93 kg/m  SpO2: SpO2: 100 % O2 Device: O2 Device: Nasal Cannula O2 Flow Rate: O2 Flow Rate (L/min): 2 L/min  Intake/output summary:   Intake/Output Summary (Last 24 hours) at 08/06/2018 1506 Last data filed at 08/06/2018 1300 Gross per 24 hour  Intake 1538.64 ml  Output 2275 ml  Net -736.36 ml   LBM: Last BM Date: (colostomy) Baseline Weight: Weight: 120.2 kg Most recent weight: Weight: 125.6 kg       Palliative Assessment/Data:    Flowsheet Rows     Most Recent Value  Intake Tab  Referral Department  Critical [name]  Unit at Time of Referral  Intermediate [name] Unit  Date Notified  08/05/18  Palliative [name] Type  New Palliative [name]  Reason for referral  Clarify Goals of [name], End of Life [name] Assistance  Date of Admission  07/26/18  Date first seen by Palliative [name]  08/05/18  # of days Palliative referral response time  0 Day(s)  # of days IP prior to Palliative referral  10  Clinical Assessment  Psychosocial & Spiritual Assessment  Palliative [name] Outcomes      Patient Active Problem List   Diagnosis Date Noted  . Elevated LFTs 07/27/2018  . Aortic atherosclerosis ([place]) 07/27/2018  . 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 Cerebral thrombosis with  cerebral infarction 05/11/2018  . CVA (cerebral vascular accident) ([place]) 05/11/2018  . Stroke-like symptoms 05/10/2018  . Hyperosmolar (nonketotic) coma ([place]) 04/15/2016  . Seizure ([place]) 04/15/2016  . AKI (acute kidney injury) ([place]) 04/15/2016  . Chronic diastolic heart failure ([place]) 04/15/2016  . Chronic diastolic CHF (congestive heart failure) ([place]) 06/04/2015  . Diverticulitis 05/28/2015  . Sigmoid diverticulitis 05/28/2015  . Hypokalemia 05/28/2015  . Diabetes mellitus type 2, controlled ([place]) 05/28/2015  . Chronic anemia 05/28/2015  . Diverticulitis of large intestine without perforation or abscess without bleeding   . Palpitation 04/12/2015  . Morbid obesity ([place]) 04/12/2015  . Mild aortic stenosis 04/12/2015  . Type 2 diabetes mellitus without complication ([place]) 81/07/7508  . Diabetes mellitus ([place]) 06/27/2014  . Hypertension 06/27/2014  . Chest pain 06/27/2014  . Asthma 06/27/2014    Palliative [name] Assessment & Plan   Recommendations/Plan:  Palliative medicine team will sign off at this time.    Recommend psych eval.   Code Status:    Code Status Orders  (From admission, onward)         Start     Ordered   07/30/18 2022  Do not attempt resuscitation (DNR)  Continuous    Question Answer Comment  In the event of cardiac or respiratory ARREST Do not call a "code blue"   In the event of cardiac or respiratory ARREST Do not perform Intubation, CPR, defibrillation or ACLS   In the event of cardiac or respiratory ARREST Use medication by any route, position, wound [name], and other measures to relive pain and suffering. May use oxygen, suction and manual treatment of airway obstruction as needed for comfort.      07/30/18 2021        Code Status History    Date Active Date Inactive Code Status Order ID Comments User Context   07/26/2018 2150 07/30/2018 2021 Full  Code [id number]  Vianne Bulls, MD ED   05/11/2018 0038 05/12/2018 2242 Full Code [id number]  Etta Quill, DO Inpatient   04/15/2016 2149 04/18/2016 1724 Full Code [id number]  Lily Kocher, MD Inpatient   05/28/2015 0208 06/07/2015 1724 Full Code [id number]  Rise Patience, MD Inpatient   06/27/2014 2056 06/28/2014 1628 Full Code [id number]  Allyne Gee, MD Inpatient       Prognosis:   Unable to determine  Discharge Planning:  To Be Determined  [name] plan was discussed with Dr. Ree Kida and surgery. This case was discussed extensively with Dr. Loistine Chance.    Thank you for allowing the Palliative Medicine Team to assist in the [name] of this patient.   Total Time 35 min Prolonged Time Billed  no      Greater than 50%  of this time was spent counseling and coordinating [name] related to the above assessment and plan.  Asencion Gowda, NP  Please contact Palliative Medicine Team phone at [phone] for questions and concerns.

## 2018-08-06 NOTE — Progress Notes (Signed)
Niece claimed that pt. wants everything to be  taken out and turned off. Palliative made aware who claimed  to wait until psyche comes to evaluate her competency and wait for their recommendation. Explained to the niece and amenable. Continue to monitor.

## 2018-08-06 NOTE — Progress Notes (Signed)
Colostomy bag leaking , changed. Bath given to the pt.

## 2018-08-06 NOTE — Progress Notes (Signed)
Arrived to hang scheduled TNA at 1800. While confirming patient's identity, the patient refused the hanging of TNA. I informed the patient of the potential complications of hypoglycemia, need for D10 to combat hypoglycemia once TNA is stopped and the importance of TNA and it's role in healing and general nutrition. Patient continued to express understanding; however, she continued to refuse TNA. Spoke with patient's nurse, who was aware and informed medical staff of patient's choices. TNA brought down to hand into Pharmacy.

## 2018-08-06 NOTE — Progress Notes (Addendum)
PROGRESS NOTE    Sharon [name]  WUG:[id number] DOB: December 26, 1945 DOA: 07/26/2018 PCP: [name], MD   Brief Narrative:  HPI on 07/26/2018 by Dr. Mitzi Hansen Sharon [name] is a 73 y.o. female with medical history significant for chronic diastolic CHF, insulin-dependent diabetes mellitus, CVA, and possible atrial fibrillation seen on loop recorder, now presenting to the emergency department for evaluation of abdominal pain with nausea and nonbloody vomiting.  Patient reports that she had been in her usual state of health until she developed some constipation little less than a week ago, experienced some relief with a Fleet enema, but went on to develop pain in the lower abdomen and on the left.  Pain is described as waxing and waning, severe at times, not associated with melena or hematochezia, and no diarrhea.  She reports that symptoms are similar to when she has had acute diverticulitis.  She has not appreciated any fevers.  Interim history Patient was admitted with abdominal pain.  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  Gastroenterology also consulted, patient underwent flexible sigmoidoscopy however scope could not be passed due to sigmoid stricture.  General surgery consulted s/p exploratory laparotomy on 08/02/2018.  Currently on TPN.  Currently patient is tired and does not want to continue further treatment.  Palliative care as well as psychiatry have been consulted. Assessment & Plan   Pneumatosis intestinalis/colonic stricture C/B adynamic ileus/persistent ileus -Pneumatosis initially managed expectantly.  Repeat imaging showed resolution however patient had persistent ileus.  She underwent sigmoidoscopy which was unrevealing except to note that she had stricture.  Further conservative management failed, patient had persistent nausea and vomiting and underwent dilatory laparotomy on 08/02/2018 -Exploratory laparotomy was unremarkable, extensive adhesions,  stricture.:  Divided proximal to this and colostomy performed. -General surgery continues to follow patient.  Discussed with Dr.White, patient does appear to be improving as she does have output into her ostomy.  Will place her on clear liquid diet. -Currently on TPN -Continue PCA hydromorphone for pain control  Decision-making capacity/depression and depression -Per PCP, patient has had mild depression has been treated with Paxil and well controlled.  She has no prior history of psychiatric hospitalization or suicide attempt.  PCPs notes show that patient's health in the last year has been characterized by "1 bad thing after another", and that patient is partially dependent on family members now which she has not been before and has caused strain on her emotionally. -Patient wishes to return home with no further treatment. -This is a very difficult and tricky situation -After meeting with the patient, feel that patient has depression which is impairing her ability to make well-informed decisions.  Other physicians have also noted this as well.  Psychiatry consulted and appreciated. -Upon asking questions regarding capacity, patient is able to articulate her medical problems and the proposed plan and treatment.  She is able to articulate the option to refuse these options and treatment plans, she understands the consequences of the treatment as well as refusing proposed treatment plan. -Palliative care has been consulted  Acute kidney injury -Resolved   Acute blood loss anemia -Open appears to be stable, continue to monitor CBC  Diabetes mellitus, type II -Home medications Victoza and Tyler Aas have been held -Currently on insulin sliding scale with CBG monitoring however patient has been fusing CBG monitoring and insulin.  Paroxysmal atrial fibrillation -Continue heparin -Cardiology consulted and appreciated, recommended adding IV metoprolol scheduled  Acute on chronic chronic diastolic  heart failure/hypertension -Metoprolol,  lisinopril, simvastatin held -Currently no dyspnea hypoxia or orthopnea -Patient has been receiving IV Lasix occasionally -Continue to monitor intake and output, daily weights -Cardiology consulted and radiated  Stroke secondary prevention -Continue heparin, aspirin and Plavix held  Hypokalemia -Potassium currently 3.6 -Continue to monitor BMP  Morbid obesity -MI 58  DVT Prophylaxis  heparin  Code Status: Full  Family Communication: Niece at bedside  Disposition Plan: Admitted.  Consultants Gastroenterology General surgery Psychiatry Palliative care Cardiology  Procedures  Flexible sigmoidoscopy on 1/17 and 07/31/2018   Antibiotics   Anti-infectives (From admission, onward)   Start     Dose/Rate Route Frequency Ordered Stop   08/02/18 1300  piperacillin-tazobactam (ZOSYN) IVPB 3.375 g     3.375 g 100 mL/hr over 30 Minutes Intravenous To Surgery 08/02/18 1254 08/02/18 1358   07/27/18 0400  piperacillin-tazobactam (ZOSYN) IVPB 3.375 g  Status:  Discontinued     3.375 g 12.5 mL/hr over 240 Minutes Intravenous Every 8 hours 07/26/18 2203 08/02/18 1820   07/26/18 2130  piperacillin-tazobactam (ZOSYN) IVPB 3.375 g     3.375 g 100 mL/hr over 30 Minutes Intravenous  Once 07/26/18 2129 07/26/18 2218      Subjective:   Sharon [name] seen and examined today.  Patient does not wish to have further treatment.  She understands the ramifications of this.  She continues to have some abdominal pain.  Does not wish to try eating or drinking anything.  Only wants ice.  Denies chest pain, shortness of breath, dizziness or headache.  Objective:   Vitals:   08/06/18 0632 08/06/18 0800 08/06/18 0859 08/06/18 1223  BP:   (!) 150/66   Pulse:      Resp:  15  15  Temp:   97.8 F (36.6 C)   TempSrc:   Oral   SpO2:  100%  100%  Weight: 125.6 kg     Height:        Intake/Output Summary (Last 24 hours) at 08/06/2018 1501 Last data filed at  08/06/2018 1300 Gross per 24 hour  Intake 1538.64 ml  Output 2275 ml  Net -736.36 ml   Filed Weights   08/04/18 0300 08/05/18 0345 08/06/18 6144  Weight: 130.5 kg 130.6 kg 125.6 kg    Exam  General: Well developed, chronically ill-appearing NAD  HEENT: NCAT, mucous membranes moist.   Neck: Supple  Cardiovascular: S1 S2 auscultated, RRR, 3/6 SEM  Respiratory: Clear to auscultation bilaterally with equal chest rise  Abdomen: Soft, obese, mildly tender around the midline incision,  nondistended, + bowel sounds, incision clean without drainage, patient with colostomy and positive gas and liquid stool in bag.  JP drain present  Extremities: warm dry without cyanosis clubbing.  Upper and lower extremity edema bilaterally  Neuro: AAOx3,nonfocal  Psych: Depressed   Data Reviewed: I have personally reviewed following labs and imaging studies  CBC: Recent Labs  Lab 08/02/18 0230 08/03/18 0325 08/04/18 0330 08/05/18 0425 08/06/18 0509  WBC 14.4* 30.0* 14.5* 16.4* 14.6*  NEUTROABS  --   --   --  13.6*  --   HGB 11.3* 9.7* 8.8* 9.5* 9.0*  HCT 36.2 31.4* 27.8* 30.5* 29.5*  MCV 93.5 95.2 93.9 93.8 94.2  PLT 384 339 244 292 315   Basic Metabolic Panel: Recent Labs  Lab 08/01/18 0244  08/04/18 0330 08/04/18 1148 08/04/18 1538 08/05/18 0425 08/05/18 2014 08/06/18 0509  NA 146*   < > 145  --  146* 144 142 143  K 3.8   < >  2.7*  --  3.5 3.2* 3.7 3.6  CL 119*   < > 121*  --  121* 118* 115* 116*  CO2 13*   < > 18*  --  19* 19* 18* 20*  GLUCOSE 109*   < > 126*  --  135* 202* 349* 402*  BUN 10   < > 17  --  13 15 17 18   CREATININE 1.03*   < > 1.16*  --  1.08* 1.06* 1.22* 1.12*  CALCIUM 8.8*   < > 8.9  --  8.9 8.9 8.8* 8.8*  MG 2.1  --   --  2.0  --  1.8  --  2.0  PHOS  --   --   --  2.7  --  4.2  --  3.5   < > = values in this interval not displayed.   GFR: Estimated Creatinine Clearance: 56.2 mL/min (A) (by C-G formula based on SCr of 1.12 mg/dL (H)). Liver Function  Tests: Recent Labs  Lab 08/05/18 0425  AST 10*  ALT 14  ALKPHOS 71  BILITOT 0.4  PROT 5.3*  ALBUMIN 2.1*   No results for input(s): LIPASE, AMYLASE in the last 168 hours. No results for input(s): AMMONIA in the last 168 hours. Coagulation Profile: No results for input(s): INR, PROTIME in the last 168 hours. Cardiac Enzymes: No results for input(s): CKTOTAL, CKMB, CKMBINDEX, TROPONINI in the last 168 hours. BNP (last 3 results) No results for input(s): PROBNP in the last 8760 hours. HbA1C: No results for input(s): HGBA1C in the last 72 hours. CBG: Recent Labs  Lab 08/04/18 2020 08/04/18 2355 08/05/18 0344 08/05/18 0741 08/05/18 1146  GLUCAP 154* 159* 192* 177* 245*   Lipid Profile: Recent Labs    08/05/18 0425  TRIG 158*   Thyroid Function Tests: No results for input(s): TSH, T4TOTAL, FREET4, T3FREE, THYROIDAB in the last 72 hours. Anemia Panel: No results for input(s): VITAMINB12, FOLATE, FERRITIN, TIBC, IRON, RETICCTPCT in the last 72 hours. Urine analysis:    Component Value Date/Time   COLORURINE YELLOW 07/27/2018 [place] 07/27/2018 1645   LABSPEC 1.030 07/27/2018 1645   PHURINE 5.0 07/27/2018 1645   GLUCOSEU >=500 (A) 07/27/2018 1645   HGBUR NEGATIVE 07/27/2018 1645   BILIRUBINUR NEGATIVE 07/27/2018 1645   KETONESUR 20 (A) 07/27/2018 1645   PROTEINUR NEGATIVE 07/27/2018 1645   UROBILINOGEN 1.0 05/28/2015 0122   NITRITE NEGATIVE 07/27/2018 1645   LEUKOCYTESUR NEGATIVE 07/27/2018 1645   Sepsis Labs: @LABRCNTIP (procalcitonin:4,lacticidven:4)  ) Recent Results (from the past 240 hour(s))  Culture, Urine     Status: None   Collection Time: 07/27/18  4:51 PM  Result Value Ref Range Status   Specimen Description URINE, CATHETERIZED  Final   Special Requests NONE  Final   Culture   Final    NO GROWTH Performed at [facility], [place] [address] [place], [place] [address]    Report Status 07/28/2018 FINAL  Final  MRSA PCR  Screening     Status: None   Collection Time: 08/02/18  6:20 PM  Result Value Ref Range Status   MRSA by PCR NEGATIVE NEGATIVE Final    Comment:        The GeneXpert MRSA Assay (FDA approved for NASAL specimens only), is one component of a comprehensive MRSA colonization surveillance program. It is not intended to diagnose MRSA infection nor to guide or monitor treatment for MRSA infections. Performed at [facility], [place] [address]  [place], [place] [address]       Radiology Studies: Dg Chest Port 1 View  Result Date: 08/04/2018 CLINICAL DATA:  Status post central line placement EXAM: PORTABLE CHEST 1 VIEW COMPARISON:  07/27/2018 FINDINGS: Cardiac shadow is enlarged but stable. Lungs are well aerated bilaterally. No pneumothorax is noted following central line placement on the right. Catheter tip is noted at the cavoatrial junction. No infiltrate is noted. IMPRESSION: No pneumothorax following central line placement. Electronically Signed   By: Inez Catalina M.D.   On: 08/04/2018 15:30     Scheduled Meds: . acetaminophen  1,000 mg Oral Q6H  . gabapentin  300 mg Oral TID  . HYDROmorphone   Intravenous Q4H  . metoprolol tartrate  5 mg Intravenous Q6H  . sodium chloride flush  10-40 mL Intracatheter Q12H   Continuous Infusions: . sodium chloride 0 mL/hr at 07/30/18 0508  . heparin 1,850 Units/hr (08/06/18 1300)  . methocarbamol (ROBAXIN) IV 500 mg (08/06/18 0600)  . TPN ADULT (ION)       LOS: 10 days   Time Spent in minutes   100 minutes ((greater than 50% of time spent with patient face to face, as well as time spent with patient's niece face to face, as well as reviewing records, discussing case with multiple consultants- psychiatry, surgery, palliative care, pharmacy, case management, and formulating a plan)   Cristal Ford D.O. on 08/06/2018 at 3:01 PM  Between 7am to 7pm - Please see pager noted on [email or web address]  After 7pm go to [email or web address]  And look for the  night coverage person covering for me after hours  [facility] Office  [phone]

## 2018-08-06 NOTE — Progress Notes (Signed)
Nutrition Follow-up  DOCUMENTATION CODES:   Obesity unspecified  INTERVENTION:   -TPN management per pharmacy -RD will continue to follow for diet advancement and supplement as appropriate  NUTRITION DIAGNOSIS:   Inadequate oral intake related to altered GI function as evidenced by NPO status.  Progressing  GOAL:   Patient will meet greater than or equal to 90% of their needs  Ongoing  MONITOR:   Diet advancement, Labs, Weight trends, Skin, I & O's  REASON FOR ASSESSMENT:   Consult New TPN/TNA  ASSESSMENT:   Sharon [name] is a 73 y.o. female with medical history significant for chronic diastolic CHF, insulin-dependent diabetes mellitus, CVA, and possible atrial fibrillation seen on loop recorder, now presenting to the emergency department for evaluation of abdominal pain with nausea and nonbloody vomiting.  Patient reports that she had been in her usual state of health until she developed some constipation little less than a week ago, experienced some relief with a Fleet enema, but went on to develop pain in the lower abdomen and on the left.  Pain is described as waxing and waning, severe at times, not associated with melena or hematochezia, and no diarrhea.  She reports that symptoms are similar to when she has had acute diverticulitis.  She has not appreciated any fevers.  1/14- NGT attempted, but pt did not tolerate 1/16- CT scan revealed persistent dilation of colon 1/17- CT revealed resolved pneumatosis, s/p flex sig (study difficult due to poor prep) 1/20- s/p PROCEDURE:Exploratory laparotomy; Lysis of adhesions x 75 minutes; Partial sigmoidectomy; End colostomy 1/22- s/p PICC placement, TPN initiated  Drains: 25 ml x 24 hours  Reviewed I/O's: -1.3 L x 24 hours and +10.4 L since admission  Case discussed with RN, who reports that pt remains flat and is refusing most cares (including fingersticks and medications). TPN was d/c yesterday due to possible transition  to comfort measures and pt refusing care. Palliative care met with pt yesterday; plan to obtain psych consult today to determine pt's capacity to make healthcare decisions and due to strong history of depression. Per RN, pt was advanced to clear liquids, but consuming very little. RD will hold on addition of oral nutriton supplements secondary to poor oral intake and hyperglycemia. Plan to resume TPN today at 1800.   Per pharmacy note, plan to resume TPN @ 45 ml/hr at 1800, which provides 969 kcals and 60 grams protein, meeting 69% of estimated kcal needs and 63% of estimated protein needs.   Labs reviewed: K, Mg, and Phos WDL. CBGS: 177-245.   Diet Order:   Diet Order            Diet clear liquid Room service appropriate? Yes; Fluid consistency: Thin  Diet effective now              EDUCATION NEEDS:   Education needs have been addressed  Skin:  Skin Assessment: Skin Integrity Issues: Skin Integrity Issues:: Incisions Incisions: closed abdomen   Last BM:  08/04/18 (via colostomy)  Height:   Ht Readings from Last 1 Encounters:  08/04/18 '4\' 11"'  (1.499 m)    Weight:   Wt Readings from Last 1 Encounters:  08/06/18 125.6 kg    Ideal Body Weight:  44.5 kg  BMI:  Body mass index is 55.93 kg/m.  Estimated Nutritional Needs:   Kcal:  1400-1600  Protein:  95-110 grams  Fluid:  > 1.4 L    Fatumata Kashani A. Jimmye Norman, RD, LDN, CDE Pager: [phone] After hours Pager: [phone]

## 2018-08-06 NOTE — Progress Notes (Signed)
Lab called asking for 1800 glucose for patient. Was told in report by previous shift that patient refused labs to be drawn from her line. Currently has heparin infusing at this time. Had long conversation with niece, POA about what patient is wanting. This nurse assesses patient and tells her that I will respect her wishes and will keep her comfortable and clean for this shift. Currently resting with eyes closed. Was given phentergan for her nausea and dry heeves. Will continue to observe. She did allow vitals to be taken and recorded. Will continue to monitor patient.

## 2018-08-06 NOTE — Progress Notes (Signed)
Pt refusing all care - only allowing pain medications. Niece is adamantly expressing that she feels like her aunts wishes arent being followed, and that staff are constantly "forcing" medical care on her aunt. This RN discussed with the niece that we cannot safely discharge her home at this time and that if she wished she could take her aunt home AMA. Niece then stated that she is NOT able to care for her aunt in the home and that she doesn't understand why we cant just keep the patient comfortable here by providing needed pain medications and hygiene but without any other medical interventions (TPN, etc..). The niece stated all she wants only pain medications and ostomy care from the hospital at this time as she is unable to provide these at home.

## 2018-08-06 NOTE — Progress Notes (Signed)
[place] for heparin Indication: atrial fibrillation  Allergies  Allergen Reactions  . Crestor [Rosuvastatin] Other (See Comments)    Myalgias  . Indomethacin Other (See Comments)    dizziness  . Invanz [Ertapenem Sodium] Hives  . Lipitor [Atorvastatin Calcium] Other (See Comments)    Myalgias   . Reglan [Metoclopramide] Other (See Comments)    TREMORS    Patient Measurements: Height: 4\' 11"  (149.9 cm) Weight: 276 lb 14.4 oz (125.6 kg) IBW/kg (Calculated) : 43.2 Heparin Dosing Weight:   Vital Signs: Temp: 97.4 F (36.3 C) (01/24 0300) Temp Source: Oral (01/24 0300) BP: 163/94 (01/24 0300) Pulse Rate: 118 (01/24 0300)  Labs: Recent Labs    08/04/18 0330  08/05/18 0425 08/05/18 0600 08/05/18 2014 08/06/18 0509  HGB 8.8*  --  9.5*  --   --  9.0*  HCT 27.8*  --  30.5*  --   --  29.5*  PLT 244  --  292  --   --  276  HEPARINUNFRC  --    < >  --  0.15* 0.52 0.46  CREATININE 1.16*   < > 1.06*  --  1.22* 1.12*   < > = values in this interval not displayed.    Estimated Creatinine Clearance: 56.2 mL/min (A) (by C-G formula based on SCr of 1.12 mg/dL (H)).   Medical History: Past Medical History:  Diagnosis Date  . Anxiety   . Asthma   . CHF (congestive heart failure) ([place])   . Depression   . Diabetes mellitus   . GERD (gastroesophageal reflux disease)   . Heart murmur    echo- 04/2016- mild aortic stenosis  . History of kidney stones    passed  . Hypercholesteremia   . Hypertension   . Kidney stones    "last kidney numbers normal"  . Left ear hearing loss   . Osteoarthritis   . Seizures ([place]) 04/2016   due to Hyperosmolar Nonketotic hyperglycemia- is what it was thought to be cause by. No further seizure activity.  . Thrombophlebitis    Assessment: 73 yo female with past medical history significant for chronic diastolic heart failure, type 2 diabetes insulin-dependent, non-hemorrhagic CVA, atrial  fibrillation.  Patient enrolled in [place] study, last dose of apixaban vs placebo was noted to be 1/12 so if true medication it is cleared by now. Will plan on resuming oral AC once cleared by surgery, for now will use IV heparin. With recent surgeries will aim for low goal and omit boluses.    Heparin level now therapeutic at 0.46. CBC stable, no bleeding issues noted.   Goal of Therapy:  Heparin level 0.3-0.5 units/ml Monitor platelets by anticoagulation protocol: Yes   Plan:  -Heparin at 1850 units/h -Recheck heparin level with daily labs  Erin Hearing PharmD., BCPS Clinical Pharmacist 08/06/2018 8:09 AM

## 2018-08-06 NOTE — Progress Notes (Signed)
   08/06/18 1045  Clinical Encounter Type  Visited With Patient and family together  Visit Type Initial  Referral From Palliative care team  Consult/Referral To Chaplain  The chaplain responded to Pt. spiritual care consult from PMT.  The chaplain checked in with RN before visiting the Pt.  The Pt. niece-Donna was present at the time of the visit. A brief introduction was made by the chaplain to the Pt. And [name].  The Pt was agreeable to a spiritual care visit at another time.

## 2018-08-06 NOTE — Progress Notes (Signed)
Subjective Still with nausea, but denies emesis. Colostomy with lots of stool and gas. Abdominal discomfort better today than yesterday.  Objective: Vital signs in last 24 hours: Temp:  [97.4 F (36.3 C)-98.7 F (37.1 C)] 97.8 F (36.6 C) (01/24 0859) Pulse Rate:  [79-124] 118 (01/24 0300) Resp:  [12-19] 14 (01/24 0415) BP: (150-191)/(58-94) 150/66 (01/24 0859) SpO2:  [93 %-98 %] 98 % (01/24 0415) Weight:  [125.6 kg] 125.6 kg (01/24 4132) Last BM Date: (colostomy)  Intake/Output from previous day: 01/23 0701 - 01/24 0700 In: 1199.1 [I.V.:1029.1; IV Piggyback:100] Out: 4401 [Urine:2350; Drains:25; Stool:100] Intake/Output this shift: Total I/O In: 0  Out: 400 [Urine:400]  Gen: NAD, comfortable CV: RRR Pulm: Normal work of breathing Abd: Soft, obese, mild tenderness around midline incision - incision c/d/i without erythema; colostomy pink with liquid stool in appliance + gas. JP with thin ss output Ext: SCDs in place  Lab Results: CBC  Recent Labs    08/05/18 0425 08/06/18 0509  WBC 16.4* 14.6*  HGB 9.5* 9.0*  HCT 30.5* 29.5*  PLT 292 276   BMET Recent Labs    08/05/18 2014 08/06/18 0509  NA 142 143  K 3.7 3.6  CL 115* 116*  CO2 18* 20*  GLUCOSE 349* 402*  BUN 17 18  CREATININE 1.22* 1.12*  CALCIUM 8.8* 8.8*   PT/INR No results for input(s): LABPROT, INR in the last 72 hours. ABG No results for input(s): PHART, HCO3 in the last 72 hours.  Invalid input(s): PCO2, PO2  Studies/Results:  Anti-infectives: Anti-infectives (From admission, onward)   Start     Dose/Rate Route Frequency Ordered Stop   08/02/18 1300  piperacillin-tazobactam (ZOSYN) IVPB 3.375 g     3.375 g 100 mL/hr over 30 Minutes Intravenous To Surgery 08/02/18 1254 08/02/18 1358   07/27/18 0400  piperacillin-tazobactam (ZOSYN) IVPB 3.375 g  Status:  Discontinued     3.375 g 12.5 mL/hr over 240 Minutes Intravenous Every 8 hours 07/26/18 2203 08/02/18 1820   07/26/18 2130   piperacillin-tazobactam (ZOSYN) IVPB 3.375 g     3.375 g 100 mL/hr over 30 Minutes Intravenous  Once 07/26/18 2129 07/26/18 2218       Assessment/Plan: Patient Active Problem List   Diagnosis Date Noted  . Elevated LFTs 07/27/2018  . Aortic atherosclerosis ([place]) 07/27/2018  . 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 Cerebral thrombosis with cerebral infarction 05/11/2018  . CVA (cerebral vascular accident) ([place]) 05/11/2018  . Stroke-like symptoms 05/10/2018  . Hyperosmolar (nonketotic) coma ([place]) 04/15/2016  . Seizure ([place]) 04/15/2016  . AKI (acute kidney injury) ([place]) 04/15/2016  . Chronic diastolic heart failure ([place]) 04/15/2016  . Chronic diastolic CHF (congestive heart failure) ([place]) 06/04/2015  . Diverticulitis 05/28/2015  . Sigmoid diverticulitis 05/28/2015  . Hypokalemia 05/28/2015  . Diabetes mellitus type 2, controlled ([place]) 05/28/2015  . Chronic anemia 05/28/2015  . Diverticulitis of large intestine without perforation or abscess without bleeding   . Palpitation 04/12/2015  . Morbid obesity ([place]) 04/12/2015  . Mild aortic stenosis 04/12/2015  . Type 2 diabetes mellitus without complication ([place]) 02/72/5366  . Diabetes mellitus ([place]) 06/27/2014  . Hypertension 06/27/2014  . Chest pain 06/27/2014  . Asthma 06/27/2014   s/p Procedure(s): EXPLORATORY LAPAROTOMY LYSIS OF ADHESION PARTIAL COLECTOMYsigmoid end COLOSTOMY 08/02/2018  -WOCN following -Appreciate medicine's assistance in her care -Glucose control -Ok for sips of clear liquids -Zofran -PICC/TPN -Once ileus resolved, will work on thickening output to help with pouching -PT/OT -Palliative care meeting yesterday and it sounds as  though she may actually have profound underlying depression. We will plan to have psychiatry evaluate her today. Further plans based up this evaluation and how she does over coming days -Ppx: SCDs, on heparin gtt for hx of afib -Dispo: pending   LOS: 10 days   Sharon Mt.  Dema Severin, M.D. [facility]

## 2018-08-07 LAB — CBC
HCT: 28.2 % — ABNORMAL LOW (ref 36.0–46.0)
Hemoglobin: 8.6 g/dL — ABNORMAL LOW (ref 12.0–15.0)
MCH: 29.7 pg (ref 26.0–34.0)
MCHC: 30.5 g/dL (ref 30.0–36.0)
MCV: 97.2 fL (ref 80.0–100.0)
Platelets: 255 10*3/uL (ref 150–400)
RBC: 2.9 MIL/uL — ABNORMAL LOW (ref 3.87–5.11)
RDW: 15.1 % (ref 11.5–15.5)
WBC: 15.3 10*3/uL — AB (ref 4.0–10.5)
nRBC: 0.1 % (ref 0.0–0.2)

## 2018-08-07 LAB — GLUCOSE, CAPILLARY: Glucose-Capillary: 384 mg/dL — ABNORMAL HIGH (ref 70–99)

## 2018-08-07 LAB — GLUCOSE, RANDOM: GLUCOSE: 439 mg/dL — AB (ref 70–99)

## 2018-08-07 LAB — HEPARIN LEVEL (UNFRACTIONATED): Heparin Unfractionated: 0.39 IU/mL (ref 0.30–0.70)

## 2018-08-07 MED ORDER — INSULIN ASPART 100 UNIT/ML ~~LOC~~ SOLN: 0.0000 [IU] | Freq: Every day | SUBCUTANEOUS | Status: DC

## 2018-08-07 MED ORDER — PAROXETINE HCL 30 MG PO TABS
80.0000 mg | ORAL_TABLET | Freq: Every day | ORAL | Status: DC
  Filled 2018-08-07 (×3): qty 1

## 2018-08-07 MED ORDER — INSULIN ASPART 100 UNIT/ML ~~LOC~~ SOLN
0.0000 [IU] | Freq: Three times a day (TID) | SUBCUTANEOUS | Status: DC
  Administered 2018-08-07: 15 [IU] via SUBCUTANEOUS

## 2018-08-07 NOTE — Progress Notes (Signed)
Pt visited by her paster and MD from her previous job today. Much encouragement given to form a new, positive dialogue, respecting her DNR status yet adhering to basic care to prevent infection and prolonged suffering. Pt accepting ice chips. One piece ostomy pouch changed: no leakage at present.

## 2018-08-07 NOTE — Progress Notes (Addendum)
Attempted to give morning meds. Patient continues to say that she is nauseated. Prn Promethazine given via IV. Patient refuses meds this am saying "NO". Patient allowed this nurse to empty colostomy bag and clean her abdomen off. Colostomy bag frequently leaked during the night near surgical incision. Surgical incision cleansed with normal saline and attempted to be kept dry. Wound care consult placed for frequent leaking colostomy near area.

## 2018-08-07 NOTE — Plan of Care (Signed)
Patient continues to refuse medical care and treatments.  She states she doesn't want "any of this", just wants "to die", and requests that she be discharged home to her sisters.  Patient refuses CBGs and insulin coverage.  Pt continues to have PCA for pain management; c/o significant nausea.  She looks and acts miserable but refuses all attempts to care for her.  Palliative has signed off considering patient's diagnosis is not terminal. Psychiatry also signed off after evaluation.  Attending is seeking Ethics consult.  Will continue all attempts to care for patient as needed.

## 2018-08-07 NOTE — Progress Notes (Signed)
Pt continues to cry out, c/o nausea and pain.  Refusing to push PCA button at this time.  Pt medicated w/Phenergan and Morphine per PRN orders.  Refusing CBG check.  Will continue to monitor.

## 2018-08-07 NOTE — Progress Notes (Signed)
[place] for heparin Indication: atrial fibrillation  Allergies  Allergen Reactions  . Crestor [Rosuvastatin] Other (See Comments)    Myalgias  . Indomethacin Other (See Comments)    dizziness  . Invanz [Ertapenem Sodium] Hives  . Lipitor [Atorvastatin Calcium] Other (See Comments)    Myalgias   . Reglan [Metoclopramide] Other (See Comments)    TREMORS    Patient Measurements: Height: 4\' 11"  (149.9 cm) Weight: 277 lb 9 oz (125.9 kg) IBW/kg (Calculated) : 43.2 Heparin Dosing Weight:   Vital Signs: Temp: 99.5 F (37.5 C) (01/25 0801) Temp Source: Oral (01/25 0801) BP: 166/92 (01/25 0801) Pulse Rate: 117 (01/25 0801)  Labs: Recent Labs    08/05/18 0425  08/05/18 2014 08/06/18 0509 08/07/18 0445  HGB 9.5*  --   --  9.0* 8.6*  HCT 30.5*  --   --  29.5* 28.2*  PLT 292  --   --  276 255  HEPARINUNFRC  --    < > 0.52 0.46 0.39  CREATININE 1.06*  --  1.22* 1.12*  --    < > = values in this interval not displayed.    Estimated Creatinine Clearance: 56.3 mL/min (A) (by C-G formula based on SCr of 1.12 mg/dL (H)).   Medical History: Past Medical History:  Diagnosis Date  . Anxiety   . Asthma   . CHF (congestive heart failure) ([place])   . Depression   . Diabetes mellitus   . GERD (gastroesophageal reflux disease)   . Heart murmur    echo- 04/2016- mild aortic stenosis  . History of kidney stones    passed  . Hypercholesteremia   . Hypertension   . Kidney stones    "last kidney numbers normal"  . Left ear hearing loss   . Osteoarthritis   . Seizures ([place]) 04/2016   due to Hyperosmolar Nonketotic hyperglycemia- is what it was thought to be cause by. No further seizure activity.  . Thrombophlebitis    Assessment: 73 yo female with past medical history significant for chronic diastolic heart failure, type 2 diabetes insulin-dependent, non-hemorrhagic CVA, atrial fibrillation.  Patient enrolled in [place] study, last dose  of apixaban vs placebo was noted to be 1/12 so if true medication is cleared by now. Will plan on resuming oral AC once cleared by surgery, for now will use IV heparin. With recent surgeries will aim for low goal and omit boluses.    Heparin level therapeutic at 0.39 on heparin 1850 units/hr. CBC stable, no bleeding issues noted. Patient has been refusing some levels of care, were able to draw labs this morning.    Goal of Therapy:  Heparin level 0.3-0.5 units/ml Monitor platelets by anticoagulation protocol: Yes   Plan:  -Heparin at 1850 units/h -Heparin level with daily labs -Continue heparin until able to take PO anticoagulant. If patient starts refusing labs, will need to further discuss with provider    Claiborne Billings, PharmD PGY2 Cardiology Pharmacy Resident Please check AMION for all Pharmacist numbers by unit 08/07/2018 10:19 AM

## 2018-08-07 NOTE — Progress Notes (Signed)
Pt refuses to be turned or repositioned at this time.  Pt tearful and stating she just "wants to die".  Pt states she is angry that no one is listening to her.  Will continue attempts to care for patient.

## 2018-08-07 NOTE — Progress Notes (Signed)
PHARMACY - ADULT TOTAL PARENTERAL NUTRITION CONSULT NOTE   Pharmacy Consult:  TPN Indication:  Colonic stricture with partial obstruction  Patient Measurements: Height: 4\' 11"  (149.9 cm) Weight: 277 lb 9 oz (125.9 kg) IBW/kg (Calculated) : 43.2 TPN AdjBW (KG): 61.1 Body mass index is 56.06 kg/m.  Assessment:  57 YOF presented on 07/26/18 with abdominal pain, nausea and vomiting, and found to have pneumatosis.  Sigmoidoscopy on 07/30/18 showed colonic stricture and on 07/31/18 showed diverticulosis.  Patient underwent ex-lap with LoA, partial sigmoidectomy and end colostomy on 08/02/18.  Pharmacy consulted to manage TPN given inadequate nutrition since admit.   GI: hx GERD.  BL prealbumin low at 5.6.  Drain O/P low at 66mL. Having stool output. Ileus seems to be resolved. PRN Zofran Endo: DM on Tresiba, Novolog and Victoza PTA.  Was on Lantus 35/d prior to TPN. Refusing SSI/CBG checks. Lytes: no labs Renal: SCr 1.12, BUN WNL Pulm: stable on RA Cards: CHF/Afib/HLD - BP/HR elevated - PRN hydralazine AC: Heparin for Afib - hgb 9.5, plts WNL Hepatobil: LFTs / tbili normalized.  TG mildly elevated at 158. Neuro: CVA/depression/seizure - APAP, Robaxin, Dilaudid PCA ID: afebrile, WBC up to 16.4 - not on abx TPN Access: CVC double lumen placed 08/02/18 TPN start date: 08/04/18  Nutritional Goals (RD rec on 1/23): 1400-1600 kCal and 95-110gm protein per day  Current Nutrition:  Clear liquid diet  Plan:   Spoke to Surgery, stop TPN and Pharmacy will be reconsulted if/when patient wants to resume care. D/C TPN labs and nursing care orders   Pamella Samons D. Mina Marble, PharmD, BCPS, [place] 08/07/2018, 10:59 AM

## 2018-08-07 NOTE — Progress Notes (Signed)
PROGRESS NOTE    Sharon [name]  JEH:[id number] DOB: 05-26-1946 DOA: 07/26/2018 PCP: [name], MD   Brief Narrative:  HPI on 07/26/2018 by Dr. Mitzi Hansen Sharon [name] is a 73 y.o. female with medical history significant for chronic diastolic CHF, insulin-dependent diabetes mellitus, CVA, and possible atrial fibrillation seen on loop recorder, now presenting to the emergency department for evaluation of abdominal pain with nausea and nonbloody vomiting.  Patient reports that she had been in her usual state of health until she developed some constipation little less than a week ago, experienced some relief with a Fleet enema, but went on to develop pain in the lower abdomen and on the left.  Pain is described as waxing and waning, severe at times, not associated with melena or hematochezia, and no diarrhea.  She reports that symptoms are similar to when she has had acute diverticulitis.  She has not appreciated any fevers.  Interim history Patient was admitted with abdominal pain.  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  Gastroenterology also consulted, patient underwent flexible sigmoidoscopy however scope could not be passed due to sigmoid stricture.  General surgery consulted s/p exploratory laparotomy on 08/02/2018.  Currently on TPN.  Currently patient is tired and does not want to continue further treatment.  Palliative care as well as psychiatry have been consulted. Patient is refusing all care at this point and only wanting pain medications.   Assessment & Plan   Pneumatosis intestinalis/colonic stricture C/B adynamic ileus/persistent ileus -Pneumatosis initially managed expectantly.  Repeat imaging showed resolution however patient had persistent ileus.  She underwent sigmoidoscopy which was unrevealing except to note that she had stricture.  Further conservative management failed, patient had persistent nausea and vomiting and underwent dilatory laparotomy on  08/02/2018 -Exploratory laparotomy was unremarkable, extensive adhesions, stricture.:  Divided proximal to this and colostomy performed. -General surgery continues to follow patient.  Discussed with Dr.White, patient does appear to be improving as she does have output into her ostomy.   -Placed on a liquid diet however intake is been very limited as patient has had nausea  -She refused to continue TPN  -Currently on PCA Dilaudid pump for pain control  Decision-making capacity/depression and depression -Per PCP, patient has had mild depression has been treated with Paxil and well controlled.  She has no prior history of psychiatric hospitalization or suicide attempt.  PCPs notes show that patient's health in the last year has been characterized by "1 bad thing after another", and that patient is partially dependent on family members now which she has not been before and has caused strain on her emotionally. -This is a very difficult and tricky situation -Niece, POA, does not feel she is able to take patient home and care for her.  Does not understand why patient cannot stay in the hospital and just received pain medications and wound/ostomy care -Patient continues to state that she would like to return home with no further treatment.  Today, patient would not speak with me much, states she has asked to be left alone and not have further things done to her.  She will not talk to me about her pain.  Discussed with patient that her nurse does not feel she is able to take her home, and that she and her niece will need to figure that out. -Patient does have capacity, she understands her medical condition.  At this time her medical condition has been treated.  However she needs to continue  to progress, especially with oral intake.  Patient has refused current treatment, plan, lab draws. -Psychiatry has been consulted, states that patient may have cognitive impairment and may need help by her POA to make  decisions. -Palliative care has signed off as patient currently is not terminal and does not meet criteria for hospice or palliative care. -Will consult Ethics  Acute kidney injury -Resolved   Acute blood loss anemia -creatinine appears to be stable, continue to monitor CBC if patient allows  Diabetes mellitus, type II -Home medications Victoza and Tyler Aas have been held -not allowing for CBG monitoring or insulin  Paroxysmal atrial fibrillation -Continue heparin -Cardiology consulted and appreciated, recommended adding IV metoprolol scheduled  Acute on chronic chronic diastolic heart failure/hypertension -Metoprolol, lisinopril, simvastatin held -Currently no dyspnea hypoxia or orthopnea -Patient has been receiving IV Lasix occasionally -Continue to monitor intake and output, daily weights -Cardiology consulted and radiated  Stroke secondary prevention -Continue heparin, aspirin and Plavix held  Hypokalemia -Last Potassium 3.6 -Continue to monitor BMP if patient allows  Morbid obesity -BMI 58  DVT Prophylaxis  heparin  Code Status: Full  Family Communication: None at bedside  Disposition Plan: Admitted.  Consultants Gastroenterology General surgery Psychiatry Palliative care Cardiology  Procedures  Flexible sigmoidoscopy on 1/17 and 07/31/2018   Antibiotics   Anti-infectives (From admission, onward)   Start     Dose/Rate Route Frequency Ordered Stop   08/02/18 1300  piperacillin-tazobactam (ZOSYN) IVPB 3.375 g     3.375 g 100 mL/hr over 30 Minutes Intravenous To Surgery 08/02/18 1254 08/02/18 1358   07/27/18 0400  piperacillin-tazobactam (ZOSYN) IVPB 3.375 g  Status:  Discontinued     3.375 g 12.5 mL/hr over 240 Minutes Intravenous Every 8 hours 07/26/18 2203 08/02/18 1820   07/26/18 2130  piperacillin-tazobactam (ZOSYN) IVPB 3.375 g     3.375 g 100 mL/hr over 30 Minutes Intravenous  Once 07/26/18 2129 07/26/18 2218      Subjective:   Ames Coupe seen and examined today.  Patient not very conversant this morning.  She does not wish to have further treatment, and understands consequences of this.  She wants to be left alone and have her pain controlled.  She does not wish to eat or drink anything at this time.    Objective:   Vitals:   08/07/18 0600 08/07/18 0800 08/07/18 0801 08/07/18 1134  BP:   (!) 166/92 (!) 171/81  Pulse:   (!) 117 80  Resp:  16 18 13   Temp:   99.5 F (37.5 C)   TempSrc:   Oral   SpO2:  94% 94% 93%  Weight: 125.9 kg     Height:        Intake/Output Summary (Last 24 hours) at 08/07/2018 1209 Last data filed at 08/07/2018 1135 Gross per 24 hour  Intake 479.12 ml  Output 1870 ml  Net -1390.88 ml   Filed Weights   08/05/18 0345 08/06/18 0632 08/07/18 0600  Weight: 130.6 kg 125.6 kg 125.9 kg   Exam  General: Well developed, chronically ill-appearing  HEENT: NCAT,  mucous membranes moist.   Neck: Supple  Cardiovascular: S1 S2 auscultated, no rubs, murmurs or gallops. Regular rate and rhythm.  Respiratory: Clear to auscultation bilaterally with equal chest rise  Abdomen: Soft, obese, mildly TTP around incision, + ostomy, JP drain present (to be removed by general surgery)  Extremities: warm dry without cyanosis clubbing.  Upper and lower extremity edema bilaterally  Neuro: AAOx3, nonfocal  Psych: Depressed  Data Reviewed: I have personally reviewed following labs and imaging studies  CBC: Recent Labs  Lab 08/03/18 0325 08/04/18 0330 08/05/18 0425 08/06/18 0509 08/07/18 0445  WBC 30.0* 14.5* 16.4* 14.6* 15.3*  NEUTROABS  --   --  13.6*  --   --   HGB 9.7* 8.8* 9.5* 9.0* 8.6*  HCT 31.4* 27.8* 30.5* 29.5* 28.2*  MCV 95.2 93.9 93.8 94.2 97.2  PLT 339 244 292 276 277   Basic Metabolic Panel: Recent Labs  Lab 08/01/18 0244  08/04/18 0330 08/04/18 1148 08/04/18 1538 08/05/18 0425 08/05/18 2014 08/06/18 0509 08/07/18 0445  NA 146*   < > 145  --  146* 144 142 143  --   K  3.8   < > 2.7*  --  3.5 3.2* 3.7 3.6  --   CL 119*   < > 121*  --  121* 118* 115* 116*  --   CO2 13*   < > 18*  --  19* 19* 18* 20*  --   GLUCOSE 109*   < > 126*  --  135* 202* 349* 402* 439*  BUN 10   < > 17  --  13 15 17 18   --   CREATININE 1.03*   < > 1.16*  --  1.08* 1.06* 1.22* 1.12*  --   CALCIUM 8.8*   < > 8.9  --  8.9 8.9 8.8* 8.8*  --   MG 2.1  --   --  2.0  --  1.8  --  2.0  --   PHOS  --   --   --  2.7  --  4.2  --  3.5  --    < > = values in this interval not displayed.   GFR: Estimated Creatinine Clearance: 56.3 mL/min (A) (by C-G formula based on SCr of 1.12 mg/dL (H)). Liver Function Tests: Recent Labs  Lab 08/05/18 0425  AST 10*  ALT 14  ALKPHOS 71  BILITOT 0.4  PROT 5.3*  ALBUMIN 2.1*   No results for input(s): LIPASE, AMYLASE in the last 168 hours. No results for input(s): AMMONIA in the last 168 hours. Coagulation Profile: No results for input(s): INR, PROTIME in the last 168 hours. Cardiac Enzymes: No results for input(s): CKTOTAL, CKMB, CKMBINDEX, TROPONINI in the last 168 hours. BNP (last 3 results) No results for input(s): PROBNP in the last 8760 hours. HbA1C: No results for input(s): HGBA1C in the last 72 hours. CBG: Recent Labs  Lab 08/04/18 2020 08/04/18 2355 08/05/18 0344 08/05/18 0741 08/05/18 1146  GLUCAP 154* 159* 192* 177* 245*   Lipid Profile: Recent Labs    08/05/18 0425  TRIG 158*   Thyroid Function Tests: No results for input(s): TSH, T4TOTAL, FREET4, T3FREE, THYROIDAB in the last 72 hours. Anemia Panel: No results for input(s): VITAMINB12, FOLATE, FERRITIN, TIBC, IRON, RETICCTPCT in the last 72 hours. Urine analysis:    Component Value Date/Time   COLORURINE YELLOW 07/27/2018 [place] 07/27/2018 1645   LABSPEC 1.030 07/27/2018 1645   PHURINE 5.0 07/27/2018 1645   GLUCOSEU >=500 (A) 07/27/2018 1645   HGBUR NEGATIVE 07/27/2018 1645   BILIRUBINUR NEGATIVE 07/27/2018 1645   KETONESUR 20 (A) 07/27/2018  1645   PROTEINUR NEGATIVE 07/27/2018 1645   UROBILINOGEN 1.0 05/28/2015 0122   NITRITE NEGATIVE 07/27/2018 1645   LEUKOCYTESUR NEGATIVE 07/27/2018 1645   Sepsis Labs: @LABRCNTIP (procalcitonin:4,lacticidven:4)  ) Recent Results (from the past 240 hour(s))  MRSA PCR Screening  Status: None   Collection Time: 08/02/18  6:20 PM  Result Value Ref Range Status   MRSA by PCR NEGATIVE NEGATIVE Final    Comment:        The GeneXpert MRSA Assay (FDA approved for NASAL specimens only), is one component of a comprehensive MRSA colonization surveillance program. It is not intended to diagnose MRSA infection nor to guide or monitor treatment for MRSA infections. Performed at [facility], [place] [address] [place], [place] [address]       Radiology Studies: No results found.   Scheduled Meds: . acetaminophen  1,000 mg Oral Q6H  . gabapentin  300 mg Oral TID  . HYDROmorphone   Intravenous Q4H  . metoprolol tartrate  5 mg Intravenous Q6H  . PARoxetine  80 mg Oral Daily  . sodium chloride flush  10-40 mL Intracatheter Q12H   Continuous Infusions: . sodium chloride 10 mL/hr at 08/07/18 0800  . heparin 1,850 Units/hr (08/07/18 0800)  . methocarbamol (ROBAXIN) IV 500 mg (08/07/18 0541)     LOS: 11 days   Time Spent in minutes   45 minutes   Sarah Zerby D.O. on 08/07/2018 at 12:09 PM  Between 7am to 7pm - Please see pager noted on [email or web address]  After 7pm go to [email or web address]  And look for the night coverage person covering for me after hours  [facility] Office  [phone]

## 2018-08-07 NOTE — Progress Notes (Signed)
Dilaudid syringe wasted in stericycle 2.5cc with Sylvan Cheese RN.

## 2018-08-07 NOTE — Progress Notes (Signed)
[facility] Progress Note  5 Days Post-Op  Subjective: CC: wants to die Patient does report to me that she has abdominal pain. Reports nausea but is refusing medications to help this. Had some leaking around colostomy overnight, WOC to see today.  Patient seems significantly depressed. Consistently refusing interventions which could potentially help her to feel better. Patient judgement seems clouded by depression at this point.  No family members at bedside this AM.   Objective: Vital signs in last 24 hours: Temp:  [97.8 F (36.6 C)-99.4 F (37.4 C)] 99.4 F (37.4 C) (01/25 0332) Pulse Rate:  [84-120] 120 (01/25 0332) Resp:  [11-21] 11 (01/25 0529) BP: (112-153)/(66-92) 138/68 (01/25 0332) SpO2:  [92 %-100 %] 96 % (01/25 0529) FiO2 (%):  [0 %-100 %] 0 % (01/24 1718) Weight:  [125.9 kg] 125.9 kg (01/25 0600) Last BM Date: 08/07/18  Intake/Output from previous day: 01/24 0701 - 01/25 0700 In: 696.2 [P.O.:240; I.V.:406.2; IV Piggyback:50] Out: 1920 [Urine:1525; Drains:95; JASNK:539] Intake/Output this shift: No intake/output data recorded.  PE: Gen:  Alert Card:  Regular rate and rhythm Pulm:  Normal effort Abd: Soft, non-distended, bowel sounds present, stoma with small amount liquid stool in ostomy bag, midline incision c/d/i with staples present. JP drain in RLQ with ss drainage Psych: flat affect, depressed  Lab Results:  Recent Labs    08/06/18 0509 08/07/18 0445  WBC 14.6* 15.3*  HGB 9.0* 8.6*  HCT 29.5* 28.2*  PLT 276 255   BMET Recent Labs    08/05/18 2014 08/06/18 0509 08/07/18 0445  NA 142 143  --   K 3.7 3.6  --   CL 115* 116*  --   CO2 18* 20*  --   GLUCOSE 349* 402* 439*  BUN 17 18  --   CREATININE 1.22* 1.12*  --   CALCIUM 8.8* 8.8*  --    PT/INR No results for input(s): LABPROT, INR in the last 72 hours. CMP     Component Value Date/Time   NA 143 08/06/2018 0509   K 3.6 08/06/2018 0509   CL 116 (H) 08/06/2018 0509   CO2  20 (L) 08/06/2018 0509   GLUCOSE 439 (H) 08/07/2018 0445   BUN 18 08/06/2018 0509   CREATININE 1.12 (H) 08/06/2018 0509   CALCIUM 8.8 (L) 08/06/2018 0509   PROT 5.3 (L) 08/05/2018 0425   ALBUMIN 2.1 (L) 08/05/2018 0425   AST 10 (L) 08/05/2018 0425   ALT 14 08/05/2018 0425   ALKPHOS 71 08/05/2018 0425   BILITOT 0.4 08/05/2018 0425   GFRNONAA 50 (L) 08/06/2018 0509   GFRAA 58 (L) 08/06/2018 0509   Lipase     Component Value Date/Time   LIPASE 22 07/26/2018 1700       Studies/Results: No results found.  Anti-infectives: Anti-infectives (From admission, onward)   Start     Dose/Rate Route Frequency Ordered Stop   08/02/18 1300  piperacillin-tazobactam (ZOSYN) IVPB 3.375 g     3.375 g 100 mL/hr over 30 Minutes Intravenous To Surgery 08/02/18 1254 08/02/18 1358   07/27/18 0400  piperacillin-tazobactam (ZOSYN) IVPB 3.375 g  Status:  Discontinued     3.375 g 12.5 mL/hr over 240 Minutes Intravenous Every 8 hours 07/26/18 2203 08/02/18 1820   07/26/18 2130  piperacillin-tazobactam (ZOSYN) IVPB 3.375 g     3.375 g 100 mL/hr over 30 Minutes Intravenous  Once 07/26/18 2129 07/26/18 2218       Assessment/Plan Mild AS A fib/flutter H/o CVA - can resume  Eliquis if patient will allow hgb level to be monitored Loop recorder in place DM2 HTN Morbid obesity AKI - Cr 1.12 yesterday Depression - psych saw and pt restarted on antidepressant  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 Dema Severin - POD#5 - patient refusing TPN and mostly refusing diet - WOC to see today for leaking around stoma - patient is refusing multiple medical interventions and stating she wants to die. I explained to patient that she is not dying from her surgery, she refused to speak to me any further after this.   ID - zosyn 1/13>1/20 FEN - CLD, patient refusing TPN VTE - SCDs Foley - present  Patient care  complicated by patient wishes to die. If patient and family continue to refuse care would recommend discharge to home vs SNF depending on whether she has anyone at home to help care for her. Will discuss with MD whether JP drain could be removed. WOC to see for continued stoma care while admitted.    LOS: 11 days    Brigid Re , [facility] 08/07/2018, 7:49 AM Pager: [phone] Consults: [phone] Mon-Fri 7:00 am-4:30 pm Sat-Sun 7:00 am-11:30 am

## 2018-08-08 ENCOUNTER — Inpatient Hospital Stay (HOSPITAL_COMMUNITY): Payer: Medicare Other

## 2018-08-08 LAB — GLUCOSE, CAPILLARY: Glucose-Capillary: 281 mg/dL — ABNORMAL HIGH (ref 70–99)

## 2018-08-08 LAB — CBC
HCT: 29.1 % — ABNORMAL LOW (ref 36.0–46.0)
Hemoglobin: 8.7 g/dL — ABNORMAL LOW (ref 12.0–15.0)
MCH: 28.2 pg (ref 26.0–34.0)
MCHC: 29.9 g/dL — ABNORMAL LOW (ref 30.0–36.0)
MCV: 94.2 fL (ref 80.0–100.0)
PLATELETS: 271 10*3/uL (ref 150–400)
RBC: 3.09 MIL/uL — ABNORMAL LOW (ref 3.87–5.11)
RDW: 15 % (ref 11.5–15.5)
WBC: 16.7 10*3/uL — AB (ref 4.0–10.5)
nRBC: 0 % (ref 0.0–0.2)

## 2018-08-08 LAB — HEPARIN LEVEL (UNFRACTIONATED)
Heparin Unfractionated: <0.1 IU/mL — ABNORMAL LOW (ref 0.30–0.70)
Heparin Unfractionated: 0.28 IU/mL — ABNORMAL LOW (ref 0.30–0.70)

## 2018-08-08 LAB — BASIC METABOLIC PANEL
Anion gap: 12 (ref 5–15)
BUN: 18 mg/dL (ref 8–23)
CO2: 18 mmol/L — ABNORMAL LOW (ref 22–32)
CREATININE: 1.11 mg/dL — AB (ref 0.44–1.00)
Calcium: 8.9 mg/dL (ref 8.9–10.3)
Chloride: 110 mmol/L (ref 98–111)
GFR calc Af Amer: 58 mL/min — ABNORMAL LOW (ref >60)
GFR, EST NON AFRICAN AMERICAN: 50 mL/min — AB (ref >60)
Glucose, Bld: 278 mg/dL — ABNORMAL HIGH (ref 70–99)
Potassium: 2.8 mmol/L — ABNORMAL LOW (ref 3.5–5.1)
Sodium: 140 mmol/L (ref 135–145)

## 2018-08-08 LAB — MAGNESIUM: Magnesium: 1.8 mg/dL (ref 1.7–2.4)

## 2018-08-08 MED ORDER — OXYCODONE HCL 5 MG PO TABS: 5.0000 mg | ORAL_TABLET | ORAL | Status: DC | PRN

## 2018-08-08 MED ORDER — PSYLLIUM 95 % PO PACK
1.0000 | PACK | Freq: Every day | ORAL | Status: DC
  Filled 2018-08-08 (×2): qty 1

## 2018-08-08 MED ORDER — POTASSIUM CHLORIDE 10 MEQ/100ML IV SOLN
10.0000 meq | INTRAVENOUS | Status: AC
  Filled 2018-08-08: qty 100

## 2018-08-08 MED ORDER — MORPHINE SULFATE (PF) 2 MG/ML IV SOLN
1.0000 mg | INTRAVENOUS | Status: DC | PRN
  Administered 2018-08-08 – 2018-08-09 (×5): 1 mg via INTRAVENOUS
  Filled 2018-08-08 (×5): qty 1

## 2018-08-08 MED ORDER — HYDROMORPHONE HCL 1 MG/ML IJ SOLN
1.0000 mg | INTRAMUSCULAR | Status: DC | PRN
  Administered 2018-08-08 – 2018-08-10 (×6): 1 mg via INTRAVENOUS
  Filled 2018-08-08 (×6): qty 1

## 2018-08-08 MED ORDER — OXYCODONE HCL 5 MG PO TABS: 10.0000 mg | ORAL_TABLET | ORAL | Status: DC | PRN

## 2018-08-08 NOTE — Progress Notes (Signed)
Pt c/o significant nausea and pain; medicated w/Morphine per PRN orders; refusing PCA at this time; states "I just want to die"; "why won't you people give me what I want?".  Pt then vomits large amount of bile; ?? Aspiration.  Pt attempts to swat staff away from suctioning oral cavity. Jeannette Corpus, NP for [facility] notified; pt refusing NG placement.  Order placed for [facility] if patient will allow.

## 2018-08-08 NOTE — Progress Notes (Signed)
[facility] Progress Note  6 Days Post-Op  Subjective: CC: wants to die Patient speaks minimally to me. Had some bilious emesis overnight, when asked if she would allow nurses to place NGT she adamantly refuses. Work of breathing appears increased and patient did allow me to suction her some after coughing. Was swatting at nurses when suction attempted earlier. Abdominal pain still present. Colostomy pouch changed yesterday. Per RN inferior aspect of wound with some drainage.   No family present at bedside this AM. Consistently refusing interventions which could potentially help her to feel better.   Objective: Vital signs in last 24 hours: Temp:  [97.1 F (36.2 C)-99.5 F (37.5 C)] 97.1 F (36.2 C) (01/25 2300) Pulse Rate:  [72-117] 72 (01/25 2300) Resp:  [13-18] 15 (01/25 2300) BP: (135-183)/(59-92) 135/61 (01/25 2300) SpO2:  [93 %-99 %] 96 % (01/26 0400) Weight:  [127.2 kg] 127.2 kg (01/26 0500) Last BM Date: 08/07/18  Intake/Output from previous day: 01/25 0701 - 01/26 0700 In: 555 [P.O.:60; I.V.:456.1; IV Piggyback:39] Out: 7412 [Urine:570; Drains:495; Stool:450] Intake/Output this shift: No intake/output data recorded.  PE: Gen:  Alert Card:  Regular rate and rhythm Pulm:  increased effort, breath sounds junky from aspiration Abd: Soft, non-distended, stoma with small amount liquid stool in ostomy bag, midline incision with some mild erythema and drainage at inferior portion - several staples removed and tan foul smelling fluid evacuated. JP drain in RLQ with ss drainage Psych: flat affect, depressed  Lab Results:  Recent Labs    08/07/18 0445 08/08/18 0534  WBC 15.3* 16.7*  HGB 8.6* 8.7*  HCT 28.2* 29.1*  PLT 255 271   BMET Recent Labs    08/06/18 0509 08/07/18 0445 08/08/18 0534  NA 143  --  140  K 3.6  --  2.8*  CL 116*  --  110  CO2 20*  --  18*  GLUCOSE 402* 439* 278*  BUN 18  --  18  CREATININE 1.12*  --  1.11*  CALCIUM 8.8*  --   8.9   PT/INR No results for input(s): LABPROT, INR in the last 72 hours. CMP     Component Value Date/Time   NA 140 08/08/2018 0534   K 2.8 (L) 08/08/2018 0534   CL 110 08/08/2018 0534   CO2 18 (L) 08/08/2018 0534   GLUCOSE 278 (H) 08/08/2018 0534   BUN 18 08/08/2018 0534   CREATININE 1.11 (H) 08/08/2018 0534   CALCIUM 8.9 08/08/2018 0534   PROT 5.3 (L) 08/05/2018 0425   ALBUMIN 2.1 (L) 08/05/2018 0425   AST 10 (L) 08/05/2018 0425   ALT 14 08/05/2018 0425   ALKPHOS 71 08/05/2018 0425   BILITOT 0.4 08/05/2018 0425   GFRNONAA 50 (L) 08/08/2018 0534   GFRAA 58 (L) 08/08/2018 0534   Lipase     Component Value Date/Time   LIPASE 22 07/26/2018 1700       Studies/Results: Dg Chest Port 1 View  Result Date: 08/08/2018 CLINICAL DATA:  Aspiration EXAM: PORTABLE CHEST 1 VIEW COMPARISON:  Chest x-rays dated 08/04/2018 and 07/27/2018. FINDINGS: Heart size and mediastinal contours are stable. RIGHT IJ central line is stable in position with tip at the level of the mid SVC. Mild central pulmonary vascular congestion without overt alveolar pulmonary edema. No confluent opacity to suggest consolidating pneumonia. No pleural effusion or pneumothorax seen. Osseous structures about the chest are unremarkable. IMPRESSION: 1. Mild central pulmonary vascular congestion without overt alveolar pulmonary edema. 2. No evidence of consolidating  pneumonia. Electronically Signed   By: Franki Cabot M.D.   On: 08/08/2018 05:54    Anti-infectives: Anti-infectives (From admission, onward)   Start     Dose/Rate Route Frequency Ordered Stop   08/02/18 1300  piperacillin-tazobactam (ZOSYN) IVPB 3.375 g     3.375 g 100 mL/hr over 30 Minutes Intravenous To Surgery 08/02/18 1254 08/02/18 1358   07/27/18 0400  piperacillin-tazobactam (ZOSYN) IVPB 3.375 g  Status:  Discontinued     3.375 g 12.5 mL/hr over 240 Minutes Intravenous Every 8 hours 07/26/18 2203 08/02/18 1820   07/26/18 2130   piperacillin-tazobactam (ZOSYN) IVPB 3.375 g     3.375 g 100 mL/hr over 30 Minutes Intravenous  Once 07/26/18 2129 07/26/18 2218       Assessment/Plan Mild AS A fib/flutter H/o CVA - can resume Eliquis Loop recorder in place DM2 HTN Morbid obesity AKI - Cr 1.11  Depression - psych saw and pt restarted on antidepressant  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 Dema Severin - POD#6 - patient refusing TPN and mostly refusing diet - WOC following for stoma care - will try to add bulking agent to reduce leakage from stoma - patient is refusing multiple medical interventions and stating she wants to die Aspiration - patient allowing intermittent suctioning, adamantly refusing NGT Wound infection - removed several staples from lower aspect of wound, wick wound with moistened gauze BID Hypokalemia - K 2.8, replace as able  ID -zosyn 1/13>1/20 FEN -CLD, patient refusing TPN VTE -SCDs Foley -present  Patient care complicated by patient wishes to die. If patient and family continue to refuse care would recommend discharge to home vs SNF depending on whether she has anyone at home to help care for her. JP drain can be removed prior to discharge. BID dressing changes.    LOS: 12 days    Brigid Re , [facility] Surgery 08/08/2018, 7:44 AM Pager: [phone] Consults: [phone] Mon-Fri 7:00 am-4:30 pm Sat-Sun 7:00 am-11:30 am

## 2018-08-08 NOTE — Progress Notes (Signed)
Rounded on patient, assessed pain. Patient stated that she didn't want any pain medication she just wanted to go home.  She also stated that she didn't want O2 on.  I encouraged patient to keep it in.  The niece (POA) stated to take it out if she didn't want it.  I told the niece that I would wean the O2 off since she was currently at 4L [place].  O2 saturations are in the low 90's on 2.5 Liters.  Will continue to monitor patient.  Tilda Burrow [facility] 08/08/2018 4:45 PM

## 2018-08-08 NOTE — Progress Notes (Signed)
   08/08/18 0900  Clinical Encounter Type  Visited With Patient  Visit Type Follow-up;Spiritual support  Referral From Nurse  Consult/Referral To Chaplain  Responded to [facility] consult for support. Patient was resting well and nurse asked her if she would like to see chaplain and she nodded to consent. Patient nod yes to question " are you a [place]"? Patient consented to chaplain's reading sacred text, listen to christian song,"How Great Thou Art" and prayer. At the end of visit Patient nodded to my inquiring of my visit as supportive. Informed patient that she is being prayed for by the spiritual care department Chaplains. Will follow up as requested.

## 2018-08-08 NOTE — Progress Notes (Signed)
Patient pulled up in bed and repositioned.  HR in the 120's to 130's as high as the 190's while repositioning. During repositioning patient was yelling out "sit me up I can't breath". Sat patient up and was able to get a small amount of secretions out.   Niece refused 1200 Metoprolol to be given stating patient would not want it.  PRN Phenergan given to help with patient's nausea. Will continue to monitor patient.  Tilda Burrow Everhart 11:55 AM 08/08/2018

## 2018-08-08 NOTE — Progress Notes (Signed)
Went back in patient's room to reassess O2.  O2 was off, niece stated that the patient ripped it off, she did not notify nursing.   O2 saturations are 89% to 91%.  Will continue to monitor.  Tilda Burrow Everhart 5:24 PM 08/08/2018

## 2018-08-08 NOTE — Plan of Care (Signed)
  Problem: Health Behavior/Discharge Planning: Goal: Ability to manage health-related needs will improve Outcome: Not Progressing   Problem: Clinical Measurements: Goal: Ability to maintain clinical measurements within normal limits will improve Outcome: Not Progressing Goal: Respiratory complications will improve Outcome: Progressing

## 2018-08-08 NOTE — Ethics Note (Signed)
This NP/Sharon [name] carrying Ethics pager received call from Dr Ree Kida regarding Sharon [name].  Patient was admitted with nausea, vomiting and constipation and was found to have severe stricture of the colon. She underwent exploratory laparotomy with partial sigmoidectomy and end colostomy on 1/20.   Her hospital course has been complicated by peri-operative atrial fibrillation/tachycardia and chronic diastolic CHF.   Per EMR she is currently  refusing medical care and reports that she is "done and is ready to go to her heavenly home."   She is refusing medical interventions.  Per discussion with attending and Palliative Care, her niece/ Sharon [name] is HPOA and main decision maker if patient does not have medical decisional capacity.  Psychiatry consulted for expert recommendations related to  capacity .  Patient has reported past psychiatric history specific to depression, anxiety and bipolar disease.  Psychiatry evaluated patient on  1- 24-2020 (see note),   along with patient's sister and niece/by phone at bedside.  It was discussed that the patient created ADs several years ago to reflect allowing a "natural death".   During this interview she was  intermittently  confused and found  not to have capacity to make her own medical decisions.  Since that psychiatric assessment patient continues to refuse life prolonging measures and niece/HPOA reinforces this decision and supports/requests for   a comfort approach..  Patient continues to decline physically and functionally.   Question from attending is what are the next steps for transition of care?  - If there is any question regarding capacity, re-consult psychiatry.  If patient does not have medical decisional capacity, HPOA makes decisions for the patient.      - If patient's prognosis is less than a few weeks residential hospice facility may be an option for transition of care.  Social work can help with this transition  -  Recommend ongoing discussion with HPOA regarding overall plan of care and treatment options,  ensuring decisions are within the context of the patient's best interest,  values and GOCs.   Wadie Lessen NP  # [phone] Ethics Committee

## 2018-08-08 NOTE — Progress Notes (Signed)
Patient and patient's niece (POA) continuing to refuse care.  Patient is refusing to turn or to allow [place] to complete any care for her at this time.   Sharon [name] 4:51 PM  08/08/2018

## 2018-08-08 NOTE — Progress Notes (Signed)
Went in at shift change. Heparin gtt was off.  I asked the niece if she knew who had came to in turn it off.  She said that she didn't know.  She had stepped out of the room for 10 minutes. Heparin gtt has been restarted.  Tilda Burrow [facility] 08/08/2018 7:45 PM

## 2018-08-08 NOTE — Progress Notes (Addendum)
[place] for heparin Indication: atrial fibrillation  Allergies  Allergen Reactions  . Crestor [Rosuvastatin] Other (See Comments)    Myalgias  . Indomethacin Other (See Comments)    dizziness  . Invanz [Ertapenem Sodium] Hives  . Lipitor [Atorvastatin Calcium] Other (See Comments)    Myalgias   . Reglan [Metoclopramide] Other (See Comments)    TREMORS    Patient Measurements: Height: 4\' 11"  (149.9 cm) Weight: 280 lb 6.8 oz (127.2 kg) IBW/kg (Calculated) : 43.2 Heparin Dosing Weight:   Vital Signs: Temp: 99.7 F (37.6 C) (01/26 0800) Temp Source: Oral (01/26 0800) BP: 178/71 (01/26 0800) Pulse Rate: 74 (01/26 0800)  Labs: Recent Labs    08/05/18 2014  08/06/18 0509 08/07/18 0445 08/08/18 0534 08/08/18 1007  HGB  --    < > 9.0* 8.6* 8.7*  --   HCT  --   --  29.5* 28.2* 29.1*  --   PLT  --   --  276 255 271  --   HEPARINUNFRC 0.52  --  0.46 0.39 <0.10* 0.28*  CREATININE 1.22*  --  1.12*  --  1.11*  --    < > = values in this interval not displayed.    Estimated Creatinine Clearance: 57.2 mL/min (A) (by C-G formula based on SCr of 1.11 mg/dL (H)).   Medical History: Past Medical History:  Diagnosis Date  . Anxiety   . Asthma   . CHF (congestive heart failure) ([place])   . Depression   . Diabetes mellitus   . GERD (gastroesophageal reflux disease)   . Heart murmur    echo- 04/2016- mild aortic stenosis  . History of kidney stones    passed  . Hypercholesteremia   . Hypertension   . Kidney stones    "last kidney numbers normal"  . Left ear hearing loss   . Osteoarthritis   . Seizures ([place]) 04/2016   due to Hyperosmolar Nonketotic hyperglycemia- is what it was thought to be cause by. No further seizure activity.  . Thrombophlebitis    Assessment: 73 yo female with past medical history significant for chronic diastolic heart failure, type 2 diabetes insulin-dependent, non-hemorrhagic CVA, atrial  fibrillation.  Patient enrolled in Senath study, last dose of apixaban vs placebo was noted to be 1/12 so if true medication is cleared by now. Will plan on resuming oral AC once cleared by surgery, for now will use IV heparin. With recent surgeries will aim for low goal and omit boluses.    Initial heparin level undetectable, redrawn. Heparin level slightly subtherapeutic at 0.28 on heparin 1850 units/hr, HL has been trending down. CBC low but stable, no bleeding issues noted. Patient has been refusing some levels of care, were able to draw repeat lab later this morning.    Goal of Therapy:  Heparin level 0.3-0.5 units/ml Monitor platelets by anticoagulation protocol: Yes   Plan:  - Ideally, would like to increase heparin slightly to 1900 units/h to maintain therapeutic levels. Discussed with nurse. Given patient and niece refusing most medications, agree we will continue at current rate to avoid potential of an attempt at increase rate and refusal of continued heparin drip.  -Heparin level with daily labs -Continue heparin until willing to take PO anticoagulant. If patient starts refusing labs, will need to further discuss with provider    Claiborne Billings, PharmD PGY2 Cardiology Pharmacy Resident Please check AMION for all Pharmacist numbers by unit 08/08/2018 11:42 AM

## 2018-08-08 NOTE — Progress Notes (Signed)
Patient has rhonchi throughout, she is having a large amount of green foamy bile being suctions when allowed.  Niece is at bedside and is refusing all medications, PO, IV and SQ.  O2 has been turned up to 5L [employer].  O2 sats are in the low 90's.  MD is aware. Will continue to monitor.  Sparrow [place] Everhart, RN 08/08/2018 10:23 AM  

## 2018-08-08 NOTE — Progress Notes (Signed)
PROGRESS NOTE    Sharon [name]  IRC:[id number] DOB: Jun 18, 1946 DOA: 07/26/2018 PCP: [name], MD   Brief Narrative:  HPI on 07/26/2018 by Dr. Mitzi Hansen Sharon [name] is a 73 y.o. female with medical history significant for chronic diastolic CHF, insulin-dependent diabetes mellitus, CVA, and possible atrial fibrillation seen on loop recorder, now presenting to the emergency department for evaluation of abdominal pain with nausea and nonbloody vomiting.  Patient reports that she had been in her usual state of health until she developed some constipation little less than a week ago, experienced some relief with a Fleet enema, but went on to develop pain in the lower abdomen and on the left.  Pain is described as waxing and waning, severe at times, not associated with melena or hematochezia, and no diarrhea.  She reports that symptoms are similar to when she has had acute diverticulitis.  She has not appreciated any fevers.  Interim history Patient was admitted with abdominal pain.  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  Gastroenterology also consulted, patient underwent flexible sigmoidoscopy however scope could not be passed due to sigmoid stricture.  General surgery consulted s/p exploratory laparotomy on 08/02/2018.  Currently on TPN.  Currently patient is tired and does not want to continue further treatment.  Palliative care as well as psychiatry have been consulted. Patient is refusing all care at this point and only wanting pain medications.   Assessment & Plan   Pneumatosis intestinalis/colonic stricture C/B adynamic ileus/persistent ileus -Pneumatosis initially managed expectantly.  Repeat imaging showed resolution however patient had persistent ileus.  She underwent sigmoidoscopy which was unrevealing except to note that she had stricture.  Further conservative management failed, patient had persistent nausea and vomiting and underwent dilatory laparotomy on  08/02/2018 -Exploratory laparotomy was unremarkable, extensive adhesions, stricture.:  Divided proximal to this and colostomy performed. -General surgery continues to follow patient.  Discussed with Dr.White, patient does appear to be improving as she does have output into her ostomy.   -Placed on a liquid diet however intake is been very limited as patient has had nausea  -She refused to continue TPN  -Discontinue Dilaudid PCA pump -Will place on Dilaudid IV pushes PRNM, oxycodone PRN for pain control; antiemetics as needed for nausea and vomiting -Overnight, patient did have several episodes of vomiting.  She would not allow for suctioning. -Continue wound care, seems that surgery removed a few staples from the lower aspect of the abdomen, and recommending quick wound with moisturize gauze twice daily -Bulking agent added to reduce leakage from stoma  Decision-making capacity/depression and depression -Per PCP, patient has had mild depression has been treated with Paxil and well controlled.  She has no prior history of psychiatric hospitalization or suicide attempt.  PCPs notes show that patient's health in the last year has been characterized by "1 bad thing after another", and that patient is partially dependent on family members now which she has not been before and has caused strain on her emotionally. -This is a very difficult and tricky situation -Niece, POA, does not feel she is able to take patient home and care for her.  Does not understand why patient cannot stay in the hospital and just received pain medications and wound/ostomy care -Patient continues to state that she would like to return home with no further treatment-she simply states she wishes to die.  States that this is multiple nurses as well as other practitioners. -Today patient would not speak with me,  RN was present. -Psychiatry has been consulted, states that patient may have cognitive impairment and may need help by her  POA to make decisions. -Palliative care has signed off as patient currently is not terminal and does not meet criteria for hospice or palliative care. -Have paged ethics twice, will continue to try to reach them  Acute kidney injury -Resolved   Acute blood loss anemia -creatinine appears to be stable, continue to monitor CBC if patient allows  Diabetes mellitus, type II -Home medications Victoza and Tyler Aas have been held -Continue insulin sliding scale with CBG monitoring as patient allows  Paroxysmal atrial fibrillation -Continue heparin -Cardiology consulted and appreciated, recommended adding IV metoprolol scheduled  Acute on chronic chronic diastolic heart failure/hypertension -Metoprolol, lisinopril, simvastatin held -Currently no dyspnea hypoxia or orthopnea -Patient has been receiving IV Lasix occasionally -Continue to monitor intake and output, daily weights -Cardiology consulted and appreciated  Stroke secondary prevention -Continue heparin, aspirin and Plavix held  Hypokalemia -Potassium 2.8 this morning, will replace as patient allows -Continue to monitor BMP if patient allows  Morbid obesity -BMI 58  DVT Prophylaxis  heparin  Code Status: Full  Family Communication: None at bedside  Disposition Plan: Admitted.  Disposition to be determined.  Patient ultimately needs to be discharged to an SNF for continued wound care and advancing of her diet.  However she will not likely allow or agree to this.  Will discuss with ethics.  Consultants Gastroenterology General surgery Psychiatry Palliative care Cardiology  Procedures  Flexible sigmoidoscopy on 1/17 and 07/31/2018 Exploratory laparotomy, partial sigmoidectomy and end colostomy on 08/02/2018  Antibiotics   Anti-infectives (From admission, onward)   Start     Dose/Rate Route Frequency Ordered Stop   08/02/18 1300  piperacillin-tazobactam (ZOSYN) IVPB 3.375 g     3.375 g 100 mL/hr over 30 Minutes  Intravenous To Surgery 08/02/18 1254 08/02/18 1358   07/27/18 0400  piperacillin-tazobactam (ZOSYN) IVPB 3.375 g  Status:  Discontinued     3.375 g 12.5 mL/hr over 240 Minutes Intravenous Every 8 hours 07/26/18 2203 08/02/18 1820   07/26/18 2130  piperacillin-tazobactam (ZOSYN) IVPB 3.375 g     3.375 g 100 mL/hr over 30 Minutes Intravenous  Once 07/26/18 2129 07/26/18 2218      Subjective:   Ames Coupe seen and examined today.  Patient not very conversant this morning.  Will not answer any questions asked to her.  Objective:   Vitals:   08/07/18 1445 08/07/18 2300 08/08/18 0400 08/08/18 0500  BP: (!) 162/59 135/61    Pulse: 83 72    Resp: 18 15    Temp:  (!) 97.1 F (36.2 C)    TempSrc:  Oral    SpO2:  99% 96%   Weight:    127.2 kg  Height:        Intake/Output Summary (Last 24 hours) at 08/08/2018 0915 Last data filed at 08/08/2018 0526 Gross per 24 hour  Intake 421.07 ml  Output 1415 ml  Net -993.93 ml   Filed Weights   08/06/18 6195 08/07/18 0600 08/08/18 0500  Weight: 125.6 kg 125.9 kg 127.2 kg   Exam  General: Well developed, chronically and acutely ill-appearing  HEENT: NCAT, mucous membranes moist.   Neck: Supple  Cardiovascular: S1 S2 auscultated, RRR, no murmur  Respiratory: Diminished breath sounds anteriorly  Abdomen: Soft, orbitally obese, midline incision with mild erythema and drainage, ostomy bag with small amount of liquid stool.  JP drain in place.  Extremities: warm dry  without cyanosis clubbing. +Edema upper/lower ext B/L  Neuro: Awake and alert, will not answer questions.  Able to move all extremities with ease.  Nonfocal  Psych: Appears frustrated and depressed  Data Reviewed: I have personally reviewed following labs and imaging studies  CBC: Recent Labs  Lab 08/04/18 0330 08/05/18 0425 08/06/18 0509 08/07/18 0445 08/08/18 0534  WBC 14.5* 16.4* 14.6* 15.3* 16.7*  NEUTROABS  --  13.6*  --   --   --   HGB 8.8* 9.5* 9.0* 8.6*  8.7*  HCT 27.8* 30.5* 29.5* 28.2* 29.1*  MCV 93.9 93.8 94.2 97.2 94.2  PLT 244 292 276 255 427   Basic Metabolic Panel: Recent Labs  Lab 08/04/18 1148 08/04/18 1538 08/05/18 0425 08/05/18 2014 08/06/18 0509 08/07/18 0445 08/08/18 0534  NA  --  146* 144 142 143  --  140  K  --  3.5 3.2* 3.7 3.6  --  2.8*  CL  --  121* 118* 115* 116*  --  110  CO2  --  19* 19* 18* 20*  --  18*  GLUCOSE  --  135* 202* 349* 402* 439* 278*  BUN  --  13 15 17 18   --  18  CREATININE  --  1.08* 1.06* 1.22* 1.12*  --  1.11*  CALCIUM  --  8.9 8.9 8.8* 8.8*  --  8.9  MG 2.0  --  1.8  --  2.0  --   --   PHOS 2.7  --  4.2  --  3.5  --   --    GFR: Estimated Creatinine Clearance: 57.2 mL/min (A) (by C-G formula based on SCr of 1.11 mg/dL (H)). Liver Function Tests: Recent Labs  Lab 08/05/18 0425  AST 10*  ALT 14  ALKPHOS 71  BILITOT 0.4  PROT 5.3*  ALBUMIN 2.1*   No results for input(s): LIPASE, AMYLASE in the last 168 hours. No results for input(s): AMMONIA in the last 168 hours. Coagulation Profile: No results for input(s): INR, PROTIME in the last 168 hours. Cardiac Enzymes: No results for input(s): CKTOTAL, CKMB, CKMBINDEX, TROPONINI in the last 168 hours. BNP (last 3 results) No results for input(s): PROBNP in the last 8760 hours. HbA1C: No results for input(s): HGBA1C in the last 72 hours. CBG: Recent Labs  Lab 08/05/18 0344 08/05/18 0741 08/05/18 1146 08/07/18 1655 08/08/18 0857  GLUCAP 192* 177* 245* 384* 281*   Lipid Profile: No results for input(s): CHOL, HDL, LDLCALC, TRIG, CHOLHDL, LDLDIRECT in the last 72 hours. Thyroid Function Tests: No results for input(s): TSH, T4TOTAL, FREET4, T3FREE, THYROIDAB in the last 72 hours. Anemia Panel: No results for input(s): VITAMINB12, FOLATE, FERRITIN, TIBC, IRON, RETICCTPCT in the last 72 hours. Urine analysis:    Component Value Date/Time   COLORURINE YELLOW 07/27/2018 [place] 07/27/2018 1645   LABSPEC 1.030  07/27/2018 1645   PHURINE 5.0 07/27/2018 1645   GLUCOSEU >=500 (A) 07/27/2018 1645   HGBUR NEGATIVE 07/27/2018 1645   BILIRUBINUR NEGATIVE 07/27/2018 1645   KETONESUR 20 (A) 07/27/2018 1645   PROTEINUR NEGATIVE 07/27/2018 1645   UROBILINOGEN 1.0 05/28/2015 0122   NITRITE NEGATIVE 07/27/2018 1645   LEUKOCYTESUR NEGATIVE 07/27/2018 1645   Sepsis Labs: @LABRCNTIP (procalcitonin:4,lacticidven:4)  ) Recent Results (from the past 240 hour(s))  MRSA PCR Screening     Status: None   Collection Time: 08/02/18  6:20 PM  Result Value Ref Range Status   MRSA by PCR NEGATIVE NEGATIVE Final    Comment:  The GeneXpert MRSA Assay (FDA approved for NASAL specimens only), is one component of a comprehensive MRSA colonization surveillance program. It is not intended to diagnose MRSA infection nor to guide or monitor treatment for MRSA infections. Performed at [facility], [place] [address], [place], [place] [address]       Radiology Studies: Dg Chest Port 1 View  Result Date: 08/08/2018 CLINICAL DATA:  Aspiration EXAM: PORTABLE CHEST 1 VIEW COMPARISON:  Chest x-rays dated 08/04/2018 and 07/27/2018. FINDINGS: Heart size and mediastinal contours are stable. RIGHT IJ central line is stable in position with tip at the level of the mid SVC. Mild central pulmonary vascular congestion without overt alveolar pulmonary edema. No confluent opacity to suggest consolidating pneumonia. No pleural effusion or pneumothorax seen. Osseous structures about the chest are unremarkable. IMPRESSION: 1. Mild central pulmonary vascular congestion without overt alveolar pulmonary edema. 2. No evidence of consolidating pneumonia. Electronically Signed   By: Franki Cabot M.D.   On: 08/08/2018 05:54     Scheduled Meds: . acetaminophen  1,000 mg Oral Q6H  . gabapentin  300 mg Oral TID  . insulin aspart  0-15 Units Subcutaneous TID WC  . insulin aspart  0-5 Units Subcutaneous QHS  . metoprolol tartrate  5 mg  Intravenous Q6H  . PARoxetine  80 mg Oral Daily  . psyllium  1 packet Oral Daily  . sodium chloride flush  10-40 mL Intracatheter Q12H   Continuous Infusions: . sodium chloride 10 mL/hr at 08/07/18 0800  . heparin 1,850 Units/hr (08/08/18 0526)  . methocarbamol (ROBAXIN) IV 500 mg (08/08/18 0641)  . potassium chloride       LOS: 12 days   Time Spent in minutes   45 minutes   Earleen Aoun D.O. on 08/08/2018 at 9:15 AM  Between 7am to 7pm - Please see pager noted on [email or web address]  After 7pm go to [email or web address]  And look for the night coverage person covering for me after hours  [facility] Office  [phone]

## 2018-08-09 DIAGNOSIS — R1114 Bilious vomiting: Secondary | ICD-10-CM

## 2018-08-09 DIAGNOSIS — Z7189 Other specified counseling: Secondary | ICD-10-CM

## 2018-08-09 DIAGNOSIS — Z515 Encounter for palliative care: Secondary | ICD-10-CM

## 2018-08-09 LAB — CBC
HCT: 28 % — ABNORMAL LOW (ref 36.0–46.0)
Hemoglobin: 8.9 g/dL — ABNORMAL LOW (ref 12.0–15.0)
MCH: 29.6 pg (ref 26.0–34.0)
MCHC: 31.8 g/dL (ref 30.0–36.0)
MCV: 93 fL (ref 80.0–100.0)
Platelets: 272 10*3/uL (ref 150–400)
RBC: 3.01 MIL/uL — ABNORMAL LOW (ref 3.87–5.11)
RDW: 15 % (ref 11.5–15.5)
WBC: 17 10*3/uL — ABNORMAL HIGH (ref 4.0–10.5)
nRBC: 0 % (ref 0.0–0.2)

## 2018-08-09 LAB — HEPARIN LEVEL (UNFRACTIONATED): Heparin Unfractionated: 0.13 IU/mL — ABNORMAL LOW (ref 0.30–0.70)

## 2018-08-09 LAB — BASIC METABOLIC PANEL
Anion gap: 16 — ABNORMAL HIGH (ref 5–15)
BUN: 23 mg/dL (ref 8–23)
CO2: 16 mmol/L — ABNORMAL LOW (ref 22–32)
CREATININE: 1.28 mg/dL — AB (ref 0.44–1.00)
Calcium: 8.6 mg/dL — ABNORMAL LOW (ref 8.9–10.3)
Chloride: 106 mmol/L (ref 98–111)
GFR calc Af Amer: 49 mL/min — ABNORMAL LOW (ref >60)
GFR calc non Af Amer: 42 mL/min — ABNORMAL LOW (ref >60)
Glucose, Bld: 412 mg/dL — ABNORMAL HIGH (ref 70–99)
Potassium: 2.9 mmol/L — ABNORMAL LOW (ref 3.5–5.1)
Sodium: 138 mmol/L (ref 135–145)

## 2018-08-09 MED ORDER — GLYCOPYRROLATE 0.2 MG/ML IJ SOLN
0.4000 mg | Freq: Once | INTRAMUSCULAR | Status: AC
  Administered 2018-08-09: 0.4 mg via INTRAVENOUS
  Filled 2018-08-09: qty 2

## 2018-08-09 MED ORDER — MORPHINE SULFATE (PF) 2 MG/ML IV SOLN
1.0000 mg | INTRAVENOUS | Status: DC
  Administered 2018-08-09 – 2018-08-10 (×5): 1 mg via INTRAVENOUS
  Filled 2018-08-09 (×5): qty 1

## 2018-08-09 MED ORDER — PROMETHAZINE HCL 25 MG/ML IJ SOLN: 12.5000 mg | INTRAMUSCULAR | Status: DC | PRN

## 2018-08-09 MED ORDER — PROMETHAZINE HCL 25 MG/ML IJ SOLN
12.5000 mg | Freq: Four times a day (QID) | INTRAMUSCULAR | Status: DC
  Administered 2018-08-09: 12.5 mg via INTRAVENOUS
  Filled 2018-08-09: qty 1

## 2018-08-09 MED ORDER — GLYCOPYRROLATE 0.2 MG/ML IJ SOLN
0.2000 mg | Freq: Three times a day (TID) | INTRAMUSCULAR | Status: DC
  Administered 2018-08-09 – 2018-08-10 (×3): 0.2 mg via INTRAVENOUS
  Filled 2018-08-09 (×3): qty 1

## 2018-08-09 MED ORDER — POTASSIUM CHLORIDE 10 MEQ/100ML IV SOLN: 10.0000 meq | INTRAVENOUS | Status: DC

## 2018-08-09 MED ORDER — PROMETHAZINE HCL 25 MG/ML IJ SOLN
12.5000 mg | Freq: Four times a day (QID) | INTRAMUSCULAR | Status: DC
  Administered 2018-08-09 – 2018-08-10 (×4): 12.5 mg via INTRAVENOUS
  Filled 2018-08-09 (×4): qty 1

## 2018-08-09 NOTE — Consult Note (Addendum)
[place] Nurse ostomy consult note Surgical team following for assessment and plan of care for abd wound.  Stoma type/location:  Pt is familiar to [facility] team for ostomy.  Consult requested stated pouch has been leaking. Pt is currently talking with Palliative Care team and states she is requesting to pursue comfort care goals. Stomal assessment/size: Stoma is red and even with skin level, approx 1 1/2 inches and oval, located in a deep crease which occurs at 3:00 o'clock and 9:00 o'clock.  Current pouch was leaking behind the barrier Peristomal assessment:  Red, macerated and painful moisture associated skin damage to stoma surrounding area approx 2 cm Output: Mod amt liquid brown-green stool Ostomy pouching: 1pc.  Education provided:  Applied one piece flexible convex pouch with barrier ring to attempt to maintain seal.  Applied belt to held hold pouch in place over crease locations. Discussed plan of care with patient and family member at the bedside. Enrolled patient in [place] program: No; pt states she will discharge to inpatient hospice facility. Ordered 5 sets of supplies to bedside for staff nurses use. [place] team will continue to follow while patient is in the hospital. Julien Girt MSN, RN, [employer], [place], [place]

## 2018-08-09 NOTE — Care Management Note (Signed)
Case Management Note  Patient Details  Name: Sharon [name] MRN: [id number] Date of Birth: [birth year]  Subjective/Objective:     Pt now s/p exp lap - new colostomy             Action/Plan:   PTA from home.  Palliative consulted.     Expected Discharge Date:                  Expected Discharge Plan:  Skilled Nursing Facility  In-House Referral:  Clinical Social Work  Discharge planning Services  CM Consult  Post Acute Care Choice:    Choice offered to:     DME Arranged:    DME Agency:     HH Arranged:    [place] Agency:     Status of Service:  In process, will continue to follow  If discussed at Long Length of Stay Meetings, dates discussed:    Additional Comments: 08/09/2018 Niece informed CM and CSW that pt will not be able to get the care required at home.  Pt has been deemed residential hospice appropriate by Palliative, pt and niece (POA [name]) are in agreement.  CSW working placement  08/06/18 Pt requiring TPN, has colostomy, persistent ileus and JP drain.  Attending does not feel pt is appropriate for [facility] referral at this time.  CM will continue to follow Maryclare Labrador, RN 08/09/2018, 3:08 PM

## 2018-08-09 NOTE — Progress Notes (Signed)
Pt refusing all medications. Will not allow assessment of abdomen wound or ostomy site. RN will continue to make attempts to access and give medications.

## 2018-08-09 NOTE — Progress Notes (Signed)
Pt refuses new bag of heparin. Notified MD and pharmacy of refusal. Pt continues to refuse IV potassium. Educated patient on the importance of potassium levels. Pt agreed to allow ostomy bag to be looked at and emptied. JP drain emptied as well. RN will continue to monitor.

## 2018-08-09 NOTE — Progress Notes (Signed)
[facility] Progress Note  7 Days Post-Op  Subjective: CC: Patient wants to die Patient minimally responsive again today. Nods some answers to questions and gives one word answers. Patient reports she is having pain but less so in abdomen and more in groin bilaterally. Refuses for nurses to help bathe patient and clean under skin folds. Refuses abdominal exam this AM. Did allow night RN to change dressing. Reports breathing feels bad and refuses follow up CXR today. States she has still been spitting up emesis. Discussed low potassium and level and replacement today, patient refusing. I explained to patient and also to POA over the phone possible side effects of low potassium level including arrhythmias and both patient and POA still refusing potassium placement.   Objective: Vital signs in last 24 hours: Temp:  [98 F (36.7 C)-99.9 F (37.7 C)] 98 F (36.7 C) (01/27 0719) Pulse Rate:  [69-125] 121 (01/27 0719) Resp:  [21-33] 22 (01/27 0719) BP: (107-178)/(51-99) 136/72 (01/27 0719) SpO2:  [91 %-95 %] 94 % (01/27 0719) Weight:  [127.2 kg] 127.2 kg (01/27 0500) Last BM Date: 08/09/18  Intake/Output from previous day: 01/26 0701 - 01/27 0700 In: 754.2 [I.V.:654.2; IV Piggyback:100] Out: 7622 [Urine:125; Drains:40; QJFHL:4562] Intake/Output this shift: No intake/output data recorded.  PE: Gen: Alert  Card: irregularly irregular rhythm Pulm: slightly increased effort, breath sounds less junky than yesterday  Abd: patient refused abdominal exam this morning Psych:flat affect, depressed  Lab Results:  Recent Labs    08/08/18 0534 08/09/18 0413  WBC 16.7* 17.0*  HGB 8.7* 8.9*  HCT 29.1* 28.0*  PLT 271 272   BMET Recent Labs    08/08/18 0534 08/09/18 0413  NA 140 138  K 2.8* 2.9*  CL 110 106  CO2 18* 16*  GLUCOSE 278* 412*  BUN 18 23  CREATININE 1.11* 1.28*  CALCIUM 8.9 8.6*   PT/INR No results for input(s): LABPROT, INR in the last 72  hours. CMP     Component Value Date/Time   NA 138 08/09/2018 0413   K 2.9 (L) 08/09/2018 0413   CL 106 08/09/2018 0413   CO2 16 (L) 08/09/2018 0413   GLUCOSE 412 (H) 08/09/2018 0413   BUN 23 08/09/2018 0413   CREATININE 1.28 (H) 08/09/2018 0413   CALCIUM 8.6 (L) 08/09/2018 0413   PROT 5.3 (L) 08/05/2018 0425   ALBUMIN 2.1 (L) 08/05/2018 0425   AST 10 (L) 08/05/2018 0425   ALT 14 08/05/2018 0425   ALKPHOS 71 08/05/2018 0425   BILITOT 0.4 08/05/2018 0425   GFRNONAA 42 (L) 08/09/2018 0413   GFRAA 49 (L) 08/09/2018 0413   Lipase     Component Value Date/Time   LIPASE 22 07/26/2018 1700       Studies/Results: Dg Chest Port 1 View  Result Date: 08/08/2018 CLINICAL DATA:  Aspiration EXAM: PORTABLE CHEST 1 VIEW COMPARISON:  Chest x-rays dated 08/04/2018 and 07/27/2018. FINDINGS: Heart size and mediastinal contours are stable. RIGHT IJ central line is stable in position with tip at the level of the mid SVC. Mild central pulmonary vascular congestion without overt alveolar pulmonary edema. No confluent opacity to suggest consolidating pneumonia. No pleural effusion or pneumothorax seen. Osseous structures about the chest are unremarkable. IMPRESSION: 1. Mild central pulmonary vascular congestion without overt alveolar pulmonary edema. 2. No evidence of consolidating pneumonia. Electronically Signed   By: Franki Cabot M.D.   On: 08/08/2018 05:54    Anti-infectives: Anti-infectives (From admission, onward)   Start  Dose/Rate Route Frequency Ordered Stop   08/02/18 1300  piperacillin-tazobactam (ZOSYN) IVPB 3.375 g     3.375 g 100 mL/hr over 30 Minutes Intravenous To Surgery 08/02/18 1254 08/02/18 1358   07/27/18 0400  piperacillin-tazobactam (ZOSYN) IVPB 3.375 g  Status:  Discontinued     3.375 g 12.5 mL/hr over 240 Minutes Intravenous Every 8 hours 07/26/18 2203 08/02/18 1820   07/26/18 2130  piperacillin-tazobactam (ZOSYN) IVPB 3.375 g     3.375 g 100 mL/hr over 30 Minutes  Intravenous  Once 07/26/18 2129 07/26/18 2218       Assessment/Plan Mild AS A fib/flutter H/o CVA -can resumeEliquis when taking in soft diet Loop recorder in place DM2 HTN Morbid obesity AKI - Cr 1.28, trending back up Depression - psych saw and pt restarted on antidepressant  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 Dema Severin - POD#7 - patient refusing TPN and mostly refusing diet - WOC following for stoma care - will try to add bulking agent to reduce leakage from stoma - patient is refusing multiple medical interventions and stating she wants to die Aspiration - patient allowing intermittent suctioning, adamantly refusing NGT Wound infection - dressing changes as patient will allow Hypokalemia - K 2.9, replace as able - patient currently refusing and discussed with POA as well. Informed both patient and POA that hypokalemia can cause problems with heart functioning and both acknowledge understanding of this. Still refusing for now.   ID -zosyn 1/13>1/20 FEN -can advance to soft diet for comfort if patient desires VTE -SCDs Foley -present  Patient care complicated by patient wishes to die. If patient and family continue to refuse care would recommend discharge to home vs SNF depending on whether she has anyone at home to help care for her. Could also consider follow up palliative consult for residential hospice. JP drain can be removed prior to discharge. BID dressing changes as allowed.    LOS: 13 days    Brigid Re , [facility] Surgery 08/09/2018, 7:48 AM Pager: [phone] Consults: [phone] Mon-Fri 7:00 am-4:30 pm Sat-Sun 7:00 am-11:30 am

## 2018-08-09 NOTE — Progress Notes (Signed)
Daily Progress Note   Patient Name: Sharon [name]       Date: 08/09/2018 DOB: Nov 01, 1945  Age: 73 y.o. MRN#: [id number] Attending Physician: Cristal Ford, DO Primary Care Physician: [name], MD Admit Date: 07/26/2018  Reason for Consultation/Follow-up: Establishing goals of care and Ethics  Subjective: Sharon [name] is lying in bed spitting up bile and retching throughout our conversation. Niece/HCPOA/Sharon [name] at bedside.   Length of Stay: 13  Current Medications: Scheduled Meds:  . glycopyrrolate  0.2 mg Intravenous TID  .  morphine injection  1 mg Intravenous Q4H  . promethazine  12.5 mg Intravenous QID  . sodium chloride flush  10-40 mL Intracatheter Q12H    Continuous Infusions: . sodium chloride 10 mL/hr at 08/07/18 0800  . methocarbamol (ROBAXIN) IV 500 mg (08/09/18 0505)    PRN Meds: sodium chloride, HYDROmorphone (DILAUDID) injection, promethazine, sodium chloride flush  Physical Exam Vitals signs and nursing note reviewed.  Constitutional:      Appearance: She is morbidly obese. She is ill-appearing.  Cardiovascular:     Rate and Rhythm: Tachycardia present.  Pulmonary:     Effort: Pulmonary effort is normal. No tachypnea, prolonged expiration or retractions.  Abdominal:     Tenderness: There is abdominal tenderness.     Comments: Vomiting small amounts of bilious fluids Midline incision with staples a little erythema LLQ colostomy with output  Neurological:     Mental Status: She is alert and oriented to person, place, and time.  Psychiatric:        Mood and Affect: Mood is depressed. Mood is not anxious.     Comments: Flat affect             Vital Signs: BP (!) 166/77 (BP Location: Right Arm)   Pulse 94   Temp 98.9 F (37.2 C) (Oral)   Resp  (!) 28   Ht '4\' 11"'  (1.499 m)   Wt 127.2 kg   LMP  (LMP Unknown)   SpO2 96%   BMI 56.64 kg/m  SpO2: SpO2: 96 % O2 Device: O2 Device: Room Air O2 Flow Rate: O2 Flow Rate (L/min): 4 L/min  Intake/output summary:   Intake/Output Summary (Last 24 hours) at 08/09/2018 1416 Last data filed at 08/09/2018 0830 Gross per 24 hour  Intake 829.16 ml  Output 1940 ml  Net -1110.84 ml   LBM: Last BM Date: 08/09/18 Baseline Weight: Weight: 120.2 kg Most recent weight: Weight: 127.2 kg       Palliative Assessment/Data: 30%   Flowsheet Rows     Most Recent Value  Intake Tab  Referral Department  Critical care  Unit at Time of Referral  Intermediate Care Unit  Date Notified  08/05/18  Palliative Care Type  New Palliative care  Reason for referral  Clarify Goals of Care, End of Life Care Assistance  Date of Admission  07/26/18  Date first seen by Palliative Care  08/05/18  # of days Palliative referral response time  0 Day(s)  # of days IP prior to Palliative referral  10  Clinical Assessment  Psychosocial & Spiritual Assessment  Palliative Care Outcomes      Patient Active Problem List   Diagnosis Date Noted  . Evaluation by psychiatric service required   . Elevated LFTs 07/27/2018  . Aortic atherosclerosis ([place]) 07/27/2018  . 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 Cerebral thrombosis with cerebral infarction 05/11/2018  . CVA (cerebral vascular accident) ([place]) 05/11/2018  . Stroke-like symptoms 05/10/2018  . Hyperosmolar (nonketotic) coma ([place]) 04/15/2016  . Seizure ([place]) 04/15/2016  . AKI (acute kidney injury) ([place]) 04/15/2016  . Chronic diastolic heart failure ([place]) 04/15/2016  . Chronic diastolic CHF (congestive heart failure) ([place]) 06/04/2015  . Diverticulitis 05/28/2015  . Sigmoid diverticulitis 05/28/2015  . Hypokalemia 05/28/2015  . Diabetes mellitus type 2, controlled ([place]) 05/28/2015  . Chronic anemia 05/28/2015  . Diverticulitis of large intestine without  perforation or abscess without bleeding   . Palpitation 04/12/2015  . Morbid obesity ([place]) 04/12/2015  . Mild aortic stenosis 04/12/2015  . Type 2 diabetes mellitus without complication ([place]) 45/40/9811  . Diabetes mellitus ([place]) 06/27/2014  . Hypertension 06/27/2014  . Chest pain 06/27/2014  . 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 GI performed flex sigmoid but could not pass scope s/t sigmoid stricture. Exp lap done 08/02/18 resulting in colostomy, partial sigmoidectomy, and lysis of adhesions. She has continued with poor progress d/t continued NV, abd pain since surgery.   Assessment: I met today with Sharon [name] and Sharon [name]. Sharon [name] expresses to me that she is tired and wants to go to heaven. She expresses that this is not how she wanted to live. Sharon [name] clearly states to me that her aunt has been very clear about her wishes for years and has always been very independent as she had no husband or children and would even travel alone. She has had declining QOL since her stroke Oct 2019 per Sharon [name] and anything less would not be acceptable QOL for Sharon [name]. She has an Forensic scientist reflecting her wishes for no heroics or aggressive care (reviewed the directive on file completed at beginning of admission) and Sharon [name] reports these are consistent with previous directives even though I have not reviewed these personally. She is also 7 days post-op with continued severe N/V, abd pain, and poor intake.   I did ask Sharon [name] bluntly if she is making this decision out of depression or declining QOL. She stopped and thought a moment and tells me that she does not feel that depression is driving her decision. Sharon [name] also does not feel  this is the  motivator for her decision and as her HCPOA she supports Sharon [name]'s request for full comfort measures at this time. We discussed GOC and how everyone views "life prolonging measures" differently and Sharon [name] believes IVF, labs, medications to all be life prolonging measures that she does not desire. We discussed focus on comfort utilizing only medications that will add to her comfort while d/c other medications, labs, tele but keeping CVC to maintain good access to achieve comfort.   Ms. Harl and Sharon [name] are religious and they both endorse peace with full comfort care and end of life "because she knows where she is going." They are praying "for God's will to be done."   All questions/concerns addressed. Emotional support provided.   Recommendations/Plan:  Goals set for full comfort measures (see conversation above)  Pursue hospice facility placement (< 2 weeks with intolerance of po intake and no desire for artificial feeding/hydration)  Pain abd: Morphine 1 mg IV every 4 hours scheduled. Dilaudid 1 mg every 2 hours prn.   Nausea: Robinul scheduled. Phenergan 12.5 mg IV every 6 hours scheduled with another 12.5 mg IV between doses prn.   Medication plan discussed with Sharon [name].   Goals of Care and Additional Recommendations:  Limitations on Scope of Treatment: Full Comfort Care  Code Status:  DNR  Prognosis:   < 2 weeks  Discharge Planning:  Hospice facility  Care plan was discussed with Dr. Ree Kida, Lurline Del PA, [place] RN  Thank you for allowing the Palliative Medicine Team to assist in the care of this patient.   Time In: 1250 Time Out: 1420 Total Time 90 min Prolonged Time Billed  yes       Greater than 50%  of this time was spent counseling and coordinating care related to the above assessment and plan.  Vinie Sill, NP Palliative Medicine Team Pager # [phone] (M-F 8a-5p) Team Phone # [phone] (Nights/Weekends)

## 2018-08-09 NOTE — Progress Notes (Signed)
Patient niece who is also POA instructed this nurse not to check patient blood sugar nor administer any other medications except Morphine or Dilaudid IV. Patient still has heparin drip infusing. Patient allowed the nurse to change abdominal dressing. Tolerated well.

## 2018-08-09 NOTE — Progress Notes (Signed)
[place] Returned returned call and advised the patient do not meet general inpatient status. If the family wants residential hospice- it is $225.00 per day for minimum of 6 weeks and the first two weeks must be paid up front.   Patient's family agreed. CSW was advised to have the family contact Alicia at [phone] to schedule appointment and set arrangements with SW Kristi.    Thurmond Butts, [place] Social Worker [phone]

## 2018-08-09 NOTE — Progress Notes (Signed)
PROGRESS NOTE    Sharon [name]  ZSW:[id number] DOB: 10/14/1945 DOA: 07/26/2018 PCP: [name], MD   Brief Narrative:  HPI on 07/26/2018 by Dr. Mitzi Hansen Sharon [name] is a 73 y.o. female with medical history significant for chronic diastolic CHF, insulin-dependent diabetes mellitus, CVA, and possible atrial fibrillation seen on loop recorder, now presenting to the emergency department for evaluation of abdominal pain with nausea and nonbloody vomiting.  Patient reports that she had been in her usual state of health until she developed some constipation little less than a week ago, experienced some relief with a Fleet enema, but went on to develop pain in the lower abdomen and on the left.  Pain is described as waxing and waning, severe at times, not associated with melena or hematochezia, and no diarrhea.  She reports that symptoms are similar to when she has had acute diverticulitis.  She has not appreciated any fevers.  Interim history Patient was admitted with abdominal pain.  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  Gastroenterology also consulted, patient underwent flexible sigmoidoscopy however scope could not be passed due to sigmoid stricture.  General surgery consulted s/p exploratory laparotomy on 08/02/2018.  Currently on TPN.  Currently patient is tired and does not want to continue further treatment.  Palliative care as well as psychiatry have been consulted. Patient is refusing all care at this point and only wanting pain medications.   Assessment & Plan   Pneumatosis intestinalis/colonic stricture C/B adynamic ileus/persistent ileus -Pneumatosis initially managed expectantly.  Repeat imaging showed resolution however patient had persistent ileus.  She underwent sigmoidoscopy which was unrevealing except to note that she had stricture.  Further conservative management failed, patient had persistent nausea and vomiting and underwent dilatory laparotomy on  08/02/2018 -Exploratory laparotomy was unremarkable, extensive adhesions, stricture.:  Divided proximal to this and colostomy performed. -General surgery continues to follow patient.  Discussed with Dr.White, patient does appear to be improving as she does have output into her ostomy.   -Placed on a liquid diet however intake is been very limited as patient has had nausea  -She refused to continue TPN  -Discontinue Dilaudid PCA pump -Will place on Dilaudid IV pushes PRNM, oxycodone PRN for pain control; antiemetics as needed for nausea and vomiting -Overnight, patient did have several episodes of vomiting.  She would not allow for suctioning. -Continue wound care, seems that surgery removed a few staples from the lower aspect of the abdomen, and recommending quick wound with moisturize gauze twice daily -Bulking agent added to reduce leakage from stoma  Decision-making capacity/depression and depression -Per PCP, patient has had mild depression has been treated with Paxil and well controlled.  She has no prior history of psychiatric hospitalization or suicide attempt.  PCPs notes show that patient's health in the last year has been characterized by "1 bad thing after another", and that patient is partially dependent on family members now which she has not been before and has caused strain on her emotionally. -This is a very difficult and tricky situation -Niece, POA, does not feel she is able to take patient home and care for her.  Does not understand why patient cannot stay in the hospital and just received pain medications and wound/ostomy care -Patient continues to state that she would like to return home with no further treatment-she simply states she wishes to die.  States that this is multiple nurses as well as other practitioners. -Today patient would not speak with me,  RN was present. -Psychiatry has been consulted, states that patient may have cognitive impairment and may need help by her  POA to make decisions. -Palliative care has signed off as patient currently is not terminal and does not meet criteria for hospice or palliative care. -Have paged ethics twice, will continue to try to reach them  ?Aspiration -patient with vomiting episodes, refused NG tube -CXR reviewed and unremarkable -suction patient as she allows  Acute kidney injury -Resolved   Acute blood loss anemia -hemoglobin appears to be stable, continue to monitor CBC if patient allows  Diabetes mellitus, type II -Home medications Victoza and Tyler Aas have been held -Continue insulin sliding scale with CBG monitoring as patient allows  Paroxysmal atrial fibrillation -Continue heparin -Cardiology consulted and appreciated, recommended adding IV metoprolol scheduled  Acute on chronic chronic diastolic heart failure/hypertension -Metoprolol, lisinopril, simvastatin held -Currently no dyspnea hypoxia or orthopnea -Patient has been receiving IV Lasix occasionally -Continue to monitor intake and output, daily weights -Cardiology consulted and appreciated- signed off  Stroke secondary prevention -Continue heparin, aspirin and Plavix held  Hypokalemia -Potassium 2.9 this morning, potassium replacement was ordered on 08/08/2018 however patient's niece, POA, refused replacement -Continue to monitor BMP if patient allows  Morbid obesity -BMI 58  Goals of care  -Patient currently DNR -She is adamant regarding wanting to die and let things take their natural course.  Niece, POA is supporting this.  Palliative care consulted and appreciated again today.  Have also discussed this case with ethics, please see documentation.  At this time, patient and POA do have the right to refuse treatment.  I have placed a consult for hospice referral which is currently pending.  DVT Prophylaxis  heparin  Code Status: Full  Family Communication: None at bedside  Disposition Plan: Admitted.  Disposition to be determined.   Patient ultimately needs to be discharged to an SNF for continued wound care and advancing of her diet.  However she will not likely allow or agree to this.  Will discuss with ethics.  Consultants Gastroenterology General surgery Psychiatry Palliative care Cardiology Ethics  Procedures  Flexible sigmoidoscopy on 1/17 and 07/31/2018 Exploratory laparotomy, partial sigmoidectomy and end colostomy on 08/02/2018  Antibiotics   Anti-infectives (From admission, onward)   Start     Dose/Rate Route Frequency Ordered Stop   08/02/18 1300  piperacillin-tazobactam (ZOSYN) IVPB 3.375 g     3.375 g 100 mL/hr over 30 Minutes Intravenous To Surgery 08/02/18 1254 08/02/18 1358   07/27/18 0400  piperacillin-tazobactam (ZOSYN) IVPB 3.375 g  Status:  Discontinued     3.375 g 12.5 mL/hr over 240 Minutes Intravenous Every 8 hours 07/26/18 2203 08/02/18 1820   07/26/18 2130  piperacillin-tazobactam (ZOSYN) IVPB 3.375 g     3.375 g 100 mL/hr over 30 Minutes Intravenous  Once 07/26/18 2129 07/26/18 2218      Subjective:   Ames Coupe seen and examined today.  Patient not very conversant this morning.  Explained to her that I have placed a hospice referral, she states she wishes to go home and be left alone.  Objective:   Vitals:   08/09/18 0339 08/09/18 0400 08/09/18 0500 08/09/18 0719  BP: (!) 151/67 (!) 136/99  136/72  Pulse: 69 (!) 115  (!) 121  Resp: (!) 29 (!) 24  (!) 22  Temp: 98 F (36.7 C)   98 F (36.7 C)  TempSrc: Oral   Axillary  SpO2: 93% 91%  94%  Weight:   127.2 kg  Height:        Intake/Output Summary (Last 24 hours) at 08/09/2018 1006 Last data filed at 08/09/2018 0830 Gross per 24 hour  Intake 829.16 ml  Output 2085 ml  Net -1255.84 ml   Filed Weights   08/07/18 0600 08/08/18 0500 08/09/18 0500  Weight: 125.9 kg 127.2 kg 127.2 kg   Exam  General: Well developed, chronically and acutely ill-appearing, NAD  HEENT: NCAT, mucous membranes moist.   Abdomen: Soft,  obese, midline incision, with dressing in place.  JP drain in place.  Patient did not allow me to touch her abdomen.  Extremities: warm dry without cyanosis clubbing.  Upper/lower extremity edema bilaterally  Neuro: AAOx3, nonfocal  Psych: Flat, depressed  Data Reviewed: I have personally reviewed following labs and imaging studies  CBC: Recent Labs  Lab 08/05/18 0425 08/06/18 0509 08/07/18 0445 08/08/18 0534 08/09/18 0413  WBC 16.4* 14.6* 15.3* 16.7* 17.0*  NEUTROABS 13.6*  --   --   --   --   HGB 9.5* 9.0* 8.6* 8.7* 8.9*  HCT 30.5* 29.5* 28.2* 29.1* 28.0*  MCV 93.8 94.2 97.2 94.2 93.0  PLT 292 276 255 271 734   Basic Metabolic Panel: Recent Labs  Lab 08/04/18 1148  08/05/18 0425 08/05/18 2014 08/06/18 0509 08/07/18 0445 08/08/18 0534 08/08/18 1054 08/09/18 0413  NA  --    < > 144 142 143  --  140  --  138  K  --    < > 3.2* 3.7 3.6  --  2.8*  --  2.9*  CL  --    < > 118* 115* 116*  --  110  --  106  CO2  --    < > 19* 18* 20*  --  18*  --  16*  GLUCOSE  --    < > 202* 349* 402* 439* 278*  --  412*  BUN  --    < > 15 17 18   --  18  --  23  CREATININE  --    < > 1.06* 1.22* 1.12*  --  1.11*  --  1.28*  CALCIUM  --    < > 8.9 8.8* 8.8*  --  8.9  --  8.6*  MG 2.0  --  1.8  --  2.0  --   --  1.8  --   PHOS 2.7  --  4.2  --  3.5  --   --   --   --    < > = values in this interval not displayed.   GFR: Estimated Creatinine Clearance: 49.6 mL/min (A) (by C-G formula based on SCr of 1.28 mg/dL (H)). Liver Function Tests: Recent Labs  Lab 08/05/18 0425  AST 10*  ALT 14  ALKPHOS 71  BILITOT 0.4  PROT 5.3*  ALBUMIN 2.1*   No results for input(s): LIPASE, AMYLASE in the last 168 hours. No results for input(s): AMMONIA in the last 168 hours. Coagulation Profile: No results for input(s): INR, PROTIME in the last 168 hours. Cardiac Enzymes: No results for input(s): CKTOTAL, CKMB, CKMBINDEX, TROPONINI in the last 168 hours. BNP (last 3 results) No results for  input(s): PROBNP in the last 8760 hours. HbA1C: No results for input(s): HGBA1C in the last 72 hours. CBG: Recent Labs  Lab 08/05/18 0344 08/05/18 0741 08/05/18 1146 08/07/18 1655 08/08/18 0857  GLUCAP 192* 177* 245* 384* 281*   Lipid Profile: No results for input(s): CHOL, HDL, LDLCALC, TRIG, CHOLHDL, LDLDIRECT in  the last 72 hours. Thyroid Function Tests: No results for input(s): TSH, T4TOTAL, FREET4, T3FREE, THYROIDAB in the last 72 hours. Anemia Panel: No results for input(s): VITAMINB12, FOLATE, FERRITIN, TIBC, IRON, RETICCTPCT in the last 72 hours. Urine analysis:    Component Value Date/Time   COLORURINE YELLOW 07/27/2018 [place] 07/27/2018 1645   LABSPEC 1.030 07/27/2018 1645   PHURINE 5.0 07/27/2018 1645   GLUCOSEU >=500 (A) 07/27/2018 1645   HGBUR NEGATIVE 07/27/2018 1645   BILIRUBINUR NEGATIVE 07/27/2018 1645   KETONESUR 20 (A) 07/27/2018 1645   PROTEINUR NEGATIVE 07/27/2018 1645   UROBILINOGEN 1.0 05/28/2015 0122   NITRITE NEGATIVE 07/27/2018 1645   LEUKOCYTESUR NEGATIVE 07/27/2018 1645   Sepsis Labs: @LABRCNTIP (procalcitonin:4,lacticidven:4)  ) Recent Results (from the past 240 hour(s))  MRSA PCR Screening     Status: None   Collection Time: 08/02/18  6:20 PM  Result Value Ref Range Status   MRSA by PCR NEGATIVE NEGATIVE Final    Comment:        The GeneXpert MRSA Assay (FDA approved for NASAL specimens only), is one component of a comprehensive MRSA colonization surveillance program. It is not intended to diagnose MRSA infection nor to guide or monitor treatment for MRSA infections. Performed at [facility], [place] [address] [place], [place] [address]       Radiology Studies: Dg Chest Port 1 View  Result Date: 08/08/2018 CLINICAL DATA:  Aspiration EXAM: PORTABLE CHEST 1 VIEW COMPARISON:  Chest x-rays dated 08/04/2018 and 07/27/2018. FINDINGS: Heart size and mediastinal contours are stable. RIGHT IJ central line is  stable in position with tip at the level of the mid SVC. Mild central pulmonary vascular congestion without overt alveolar pulmonary edema. No confluent opacity to suggest consolidating pneumonia. No pleural effusion or pneumothorax seen. Osseous structures about the chest are unremarkable. IMPRESSION: 1. Mild central pulmonary vascular congestion without overt alveolar pulmonary edema. 2. No evidence of consolidating pneumonia. Electronically Signed   By: Franki Cabot M.D.   On: 08/08/2018 05:54     Scheduled Meds: . acetaminophen  1,000 mg Oral Q6H  . gabapentin  300 mg Oral TID  . insulin aspart  0-15 Units Subcutaneous TID WC  . insulin aspart  0-5 Units Subcutaneous QHS  . metoprolol tartrate  5 mg Intravenous Q6H  . PARoxetine  80 mg Oral Daily  . psyllium  1 packet Oral Daily  . sodium chloride flush  10-40 mL Intracatheter Q12H   Continuous Infusions: . sodium chloride 10 mL/hr at 08/07/18 0800  . heparin 1,850 Units/hr (08/08/18 1933)  . methocarbamol (ROBAXIN) IV 500 mg (08/09/18 0505)  . potassium chloride       LOS: 13 days   Time Spent in minutes   45 minutes   Zackarey Holleman D.O. on 08/09/2018 at 10:06 AM  Between 7am to 7pm - Please see pager noted on [email or web address]  After 7pm go to [email or web address]  And look for the night coverage person covering for me after hours  [facility] Office  [phone]

## 2018-08-09 NOTE — Progress Notes (Addendum)
[place] for heparin Indication: atrial fibrillation  Allergies  Allergen Reactions  . Crestor [Rosuvastatin] Other (See Comments)    Myalgias  . Indomethacin Other (See Comments)    dizziness  . Invanz [Ertapenem Sodium] Hives  . Lipitor [Atorvastatin Calcium] Other (See Comments)    Myalgias   . Reglan [Metoclopramide] Other (See Comments)    TREMORS    Patient Measurements: Height: 4\' 11"  (149.9 cm) Weight: 280 lb 6.8 oz (127.2 kg) IBW/kg (Calculated) : 43.2 Heparin Dosing Weight:   Vital Signs: Temp: 98 F (36.7 C) (01/27 0719) Temp Source: Axillary (01/27 0719) BP: 136/72 (01/27 0719) Pulse Rate: 121 (01/27 0719)  Labs: Recent Labs    08/07/18 0445 08/08/18 0534 08/08/18 1007 08/09/18 0413  HGB 8.6* 8.7*  --  8.9*  HCT 28.2* 29.1*  --  28.0*  PLT 255 271  --  272  HEPARINUNFRC 0.39 <0.10* 0.28* 0.13*  CREATININE  --  1.11*  --  1.28*    Estimated Creatinine Clearance: 49.6 mL/min (A) (by C-G formula based on SCr of 1.28 mg/dL (H)).   Medical History: Past Medical History:  Diagnosis Date  . Anxiety   . Asthma   . CHF (congestive heart failure) ([place])   . Depression   . Diabetes mellitus   . GERD (gastroesophageal reflux disease)   . Heart murmur    echo- 04/2016- mild aortic stenosis  . History of kidney stones    passed  . Hypercholesteremia   . Hypertension   . Kidney stones    "last kidney numbers normal"  . Left ear hearing loss   . Osteoarthritis   . Seizures ([place]) 04/2016   due to Hyperosmolar Nonketotic hyperglycemia- is what it was thought to be cause by. No further seizure activity.  . Thrombophlebitis    Assessment: 73 yo female with past medical history significant for chronic diastolic heart failure, type 2 diabetes insulin-dependent, non-hemorrhagic CVA, atrial fibrillation.  Patient enrolled in [place] study, last dose of apixaban vs placebo was noted to be 1/12 so if true medication is  cleared by now. Will plan on resuming oral AC once cleared by surgery, for now will use IV heparin. With recent surgeries will aim for low goal and omit boluses.    Heparin level low this morning, however pt is refusing most patient care. Per RN, heparin bag ran out this morning and pt initially unwilling to allow bag to be replaced. Will defer titrating heparin at this time and will not repeat levels outside of daily lab draw.   Goal of Therapy:  Heparin level 0.3-0.5 units/ml Monitor platelets by anticoagulation protocol: Yes   Plan:  -Continue heparin 1850 units/hr for now as pt allows -Heparin level with daily labs -Continue heparin until willing to take PO anticoagulant. If patient starts refusing labs, will need to further discuss with provider    ADDENDUM: Pt refusing heparin therapy at this time and also oral anticoagulants. MD aware, RN will let pharmacy know if pt agrees to resume heparin infusion.   Arrie Senate, PharmD, BCPS Clinical Pharmacist [phone] Please check AMION for all [facility] numbers 08/09/2018

## 2018-08-09 NOTE — Progress Notes (Addendum)
CSW and CN spoke with the patient's niece regarding consult for hospice care. She agreed the goal of care was to transition the patient to residential hospice and selected [facility].    CSW called in  referral to [facility]. [facility] and faxed clinicals to the facility. CSW was advised they will contact after review.    Thurmond Butts, [place] Social Worker [phone]

## 2018-08-10 DIAGNOSIS — K56609 Unspecified intestinal obstruction, unspecified as to partial versus complete obstruction: Secondary | ICD-10-CM

## 2018-08-10 DIAGNOSIS — K56699 Other intestinal obstruction unspecified as to partial versus complete obstruction: Secondary | ICD-10-CM

## 2018-08-10 DIAGNOSIS — I10 Essential (primary) hypertension: Secondary | ICD-10-CM

## 2018-08-10 DIAGNOSIS — I480 Paroxysmal atrial fibrillation: Secondary | ICD-10-CM

## 2018-08-10 DIAGNOSIS — F329 Major depressive disorder, single episode, unspecified: Secondary | ICD-10-CM

## 2018-08-10 NOTE — Care Management Important Message (Signed)
Important Message  Patient Details  Name: Sharon [name] MRN: [id number] Date of Birth: 09/13/1945   Medicare Important Message Given:  Yes    Tommy Medal 08/10/2018, 10:46 AM

## 2018-08-10 NOTE — Progress Notes (Signed)
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 Dema Severin - patient refusing TPN and mostly refusing diet - WOCfollowing for stoma care - pt has requested comfort care, hospice planning  [place]- adamantly refusing NGT Wound infection- dressing changes  ID -zosyn 1/13>1/20 FEN -can advance to soft diet for comfort if patient desires VTE -SCDs Foley -present  Comfort care. JP drain can be removed prior to discharge. BID dressing changes as allowed.    LOS: 14 days    Subjective: CC: abdominal pain  Pt's sister in law at bedside. Explained to pt the importance of dressing changes to prevent skin irritation. She seemed amenable. Pt was sleeping but would wake briefly.   Objective: Vital signs in last 24 hours: Temp:  [98.9 F (37.2 C)] 98.9 F (37.2 C) (01/27 1130) Pulse Rate:  [94] 94 (01/27 1130) Resp:  [28] 28 (01/27 1130) BP: (166)/(77) 166/77 (01/27 1130) SpO2:  [96 %] 96 % (01/28 0730) Weight:  [124.7 kg] 124.7 kg (01/28 0500) Last BM Date: 08/09/18  Intake/Output from previous day: 01/27 0701 - 01/28 0700 In: 195 [P.O.:195] Out: 1070 [Urine:800; Drains:70; Stool:200] Intake/Output this shift: No intake/output data recorded.  PE: Gen: sleeping, lethargic  Pulm: rate and effort normal  Abd: Soft, ostomy bag with no output, midline incision with some mild erythema and drainage at inferior portion. JP drain in RLQ with ss drainage, TTP Left hemiabdomen   Anti-infectives: Anti-infectives (From admission, onward)   Start     Dose/Rate Route Frequency Ordered Stop   08/02/18 1300  piperacillin-tazobactam (ZOSYN) IVPB 3.375 g     3.375 g 100 mL/hr over 30 Minutes Intravenous To Surgery 08/02/18 1254 08/02/18 1358   07/27/18 0400  piperacillin-tazobactam (ZOSYN) IVPB 3.375 g   Status:  Discontinued     3.375 g 12.5 mL/hr over 240 Minutes Intravenous Every 8 hours 07/26/18 2203 08/02/18 1820   07/26/18 2130  piperacillin-tazobactam (ZOSYN) IVPB 3.375 g     3.375 g 100 mL/hr over 30 Minutes Intravenous  Once 07/26/18 2129 07/26/18 2218      Lab Results:  Recent Labs    08/08/18 0534 08/09/18 0413  WBC 16.7* 17.0*  HGB 8.7* 8.9*  HCT 29.1* 28.0*  PLT 271 272   BMET Recent Labs    08/08/18 0534 08/09/18 0413  NA 140 138  K 2.8* 2.9*  CL 110 106  CO2 18* 16*  GLUCOSE 278* 412*  BUN 18 23  CREATININE 1.11* 1.28*  CALCIUM 8.9 8.6*   PT/INR No results for input(s): LABPROT, INR in the last 72 hours. CMP     Component Value Date/Time   NA 138 08/09/2018 0413   K 2.9 (L) 08/09/2018 0413   CL 106 08/09/2018 0413   CO2 16 (L) 08/09/2018 0413   GLUCOSE 412 (H) 08/09/2018 0413   BUN 23 08/09/2018 0413   CREATININE 1.28 (H) 08/09/2018 0413   CALCIUM 8.6 (L) 08/09/2018 0413   PROT 5.3 (L) 08/05/2018 0425   ALBUMIN 2.1 (L) 08/05/2018 0425   AST 10 (L) 08/05/2018 0425   ALT 14 08/05/2018 0425   ALKPHOS 71 08/05/2018 0425   BILITOT 0.4 08/05/2018 0425   GFRNONAA 42 (L) 08/09/2018 0413   GFRAA 49 (L) 08/09/2018 0413   Lipase     Component Value Date/Time   LIPASE 22 07/26/2018 1700    Studies/Results: No results found.  Kalman Drape , [facility] 08/10/2018, 7:56 AM  Pager: [phone] Mon-Wed, Friday 7:00am-4:30pm Thurs 7am-11:30am  Consults: [phone]

## 2018-08-10 NOTE — Progress Notes (Signed)
Patient will DC to: [facility] Date:08/10/2018 Family Notified: [name], niece Transport QX:[id number]  RN, patient, and facility notified of DC. Discharge Summary sent to facility. RN given number for [employer] [phone]. DC packet on chart. Ambulance transport requested for patient.   Clinical Social Worker signing off.  Thurmond Butts, [place] Social Worker [phone]

## 2018-08-10 NOTE — Discharge Summary (Signed)
Physician Discharge Summary  Sharon [name] UDJ:[id number] DOB: 04-25-1946 DOA: 07/26/2018  PCP: [name], MD  Admit date: 07/26/2018 Discharge date: 08/10/2018  Time spent: 45 minutes  Recommendations for Outpatient Follow-up:  Patient will be discharged to [facility]. Continue comfort measures.   Discharge Diagnoses:  Principal problem:  Pneumatosis intestinalis/colonic stricture C/B adynamic ileus/persistent ileus Decision-making capacity/depression and depression ?Aspiration Acute kidney injury Acute blood loss anemia Diabetes mellitus, type II Paroxysmal atrial fibrillation Acute on chronic chronic diastolic heart failure/hypertension Stroke secondary prevention Hypokalemia Morbid obesity Goals of care   Discharge Condition: Stable  Diet recommendation: comfort  Filed Weights   08/08/18 0500 08/09/18 0500 08/10/18 0500  Weight: 127.2 kg 127.2 kg 124.7 kg    History of present illness:  On 07/26/2018 by Dr. Cheri Rous Petersonis a 73 y.o.femalewith medical history significant forchronic diastolic CHF, insulin-dependent diabetes mellitus, CVA, and possible atrial fibrillation seen on loop recorder, now presenting to the emergency department for evaluation of abdominal pain with nausea and nonbloody vomiting. Patient reports that she had been in her usual state of health until she developed some constipation little less than a week ago, experiencedsome relief with a Fleet enema, but went on to develop pain in the lower abdomen and on the left. Pain is described as waxing and waning, severe at times, not associated with melena or hematochezia, and no diarrhea. She reports that symptoms are similar to when she has had acute diverticulitis. She has not appreciated any fevers.  Hospital Course:  Interim history Patient was admitted with abdominal pain.  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  Gastroenterology also  consulted, patient underwent flexible sigmoidoscopy however scope could not be passed due to sigmoid stricture.  General surgery consulted s/p exploratory laparotomy on 08/02/2018.  Currently on TPN.  Currently patient is tired and does not want to continue further treatment.  Palliative care as well as psychiatry have been consulted. Patient is refusing all care at this point and only wanting pain medications. Palliative care and Ethic consulted, patient transitioned to comfort care and being discharged to Hospice.  Pneumatosis intestinalis/colonic stricture C/B adynamic ileus/persistent ileus -Pneumatosis initially managed expectantly.  Repeat imaging showed resolution however patient had persistent ileus.  She underwent sigmoidoscopy which was unrevealing except to note that she had stricture.  Further conservative management failed, patient had persistent nausea and vomiting and underwent dilatory laparotomy on 08/02/2018 -Exploratory laparotomy was unremarkable, extensive adhesions, stricture.:  Divided proximal to this and colostomy performed. -General surgery continues to follow patient.  Discussed with Dr.White, patient does appear to be improving as she does have output into her ostomy.   -Placed on a liquid diet however intake is been very limited as patient has had nausea  -She refused to continue TPN  -Discontinue Dilaudid PCA pump -Will place on Dilaudid IV pushes PRNM, oxycodone PRN for pain control; antiemetics as needed for nausea and vomiting -Overnight, patient did have several episodes of vomiting.  She would not allow for suctioning. -Continue wound care, seems that surgery removed a few staples from the lower aspect of the abdomen, and recommending quick wound with moisturize gauze twice daily -Bulking agent was added to reduce leakage from stoma  Decision-making capacity/depression and depression -Per PCP, patient has had mild depression has been treated with Paxil and well  controlled.  She has no prior history of psychiatric hospitalization or suicide attempt.  PCPs notes show that patient's health in the last year has  been characterized by "1 bad thing after another", and that patient is partially dependent on family members now which she has not been before and has caused strain on her emotionally. -This is a very difficult and tricky situation -Niece, POA, does not feel she is able to take patient home and care for her.  Does not understand why patient cannot stay in the hospital and just received pain medications and wound/ostomy care -Patient continues to state that she would like to return home with no further treatment-she simply states she wishes to die.  States that this is multiple nurses as well as other practitioners. -Psychiatry has been consulted, states that patient may have cognitive impairment and may need help by her POA to make decisions. -Discussed with ethics, recommended hospice referral as well as repeat palliative care consultation. -Palliative care reconsulted, patient transition to comfort care  ?Aspiration -patient with vomiting episodes, refused NG tube -CXR reviewed and unremarkable -suction patient as she allows  Acute kidney injury -Resolved   Acute blood loss anemia -hemoglobin appears to be stable  Diabetes mellitus, type II -Home medications Victoza and Tyler Aas have been held -Continue insulin sliding scale with CBG monitoring as patient allows  Paroxysmal atrial fibrillation -Continue heparin -Cardiology consulted and appreciated, recommended adding IV metoprolol scheduled  Acute on chronic chronic diastolic heart failure/hypertension -Metoprolol, lisinopril, simvastatin held -Currently no dyspnea hypoxia or orthopnea -Patient has been receiving IV Lasix occasionally -Continue to monitor intake and output, daily weights -Cardiology consulted and appreciated- signed off  Stroke secondary prevention -Was placed  on heparin, as aspirin and Plavix were held   Hypokalemia -Potassium 2.9 this morning, potassium replacement was ordered on 08/08/2018 however patient's niece, POA, refused replacement  Morbid obesity -BMI 58  Goals of care  -Patient currently DNR -She is adamant regarding wanting to die and let things take their natural course.  Niece, POA is supporting this.  Palliative care consulted and appreciated again today.  Have also discussed this case with ethics, please see documentation.  At this time, patient and POA do have the right to refuse treatment.  -Palliative care reconsulted, and patient transition to comfort care.  Planning for discharge to residential hospice.  Consultants Gastroenterology General surgery Psychiatry Palliative care Cardiology Ethics  Procedures  Flexible sigmoidoscopy on 1/17 and 07/31/2018 Exploratory laparotomy, partial sigmoidectomy and end colostomy on 08/02/2018  Discharge Exam: Vitals:   08/09/18 1130 08/10/18 0730  BP: (!) 166/77   Pulse: 94   Resp: (!) 28   Temp: 98.9 F (37.2 C)   SpO2: 96% 96%     General: Well developed, chronically and acutely ill-appearing, NAD  HEENT: NCAT, mucous membranes moist.  Abdomen: Obese, midline incision, with dressing in place, colostomy bag. TTP  Extremities: warm dry without cyanosis clubbing. +Upper/lower ext edema  Discharge Instructions  Allergies as of 08/10/2018      Reactions   Crestor [rosuvastatin] Other (See Comments)   Myalgias   Indomethacin Other (See Comments)   dizziness   Invanz [ertapenem Sodium] Hives   Lipitor [atorvastatin Calcium] Other (See Comments)   Myalgias    Reglan [metoclopramide] Other (See Comments)   TREMORS      Medication List    STOP taking these medications   ALIGN 4 MG Caps   aspirin 81 MG EC tablet   beta carotene w/minerals tablet   CALTRATE 600+D 600-400 MG-UNIT tablet Generic drug:  Calcium Carbonate-Vitamin D   clopidogrel 75 MG  tablet Commonly known as:  PLAVIX  DEXILANT 60 MG capsule Generic drug:  dexlansoprazole   dextromethorphan-guaiFENesin 30-600 MG 12hr tablet Commonly known as:  MUCINEX DM   ferrous sulfate 325 (65 FE) MG tablet   furosemide 40 MG tablet Commonly known as:  LASIX   gabapentin 300 MG capsule Commonly known as:  NEURONTIN   Lidocaine-Hydrocortisone Ace 3-0.5 % Crea   liraglutide 18 MG/3ML Sopn Commonly known as:  VICTOZA   lisinopril 10 MG tablet Commonly known as:  PRINIVIL,ZESTRIL   LUBRICATING EYE DROPS OP   meloxicam 15 MG tablet Commonly known as:  MOBIC   metoprolol succinate 25 MG 24 hr tablet Commonly known as:  TOPROL-XL   NOVOLOG FLEXPEN 100 UNIT/ML FlexPen Generic drug:  insulin aspart   ondansetron 4 MG tablet Commonly known as:  ZOFRAN   PARoxetine 40 MG tablet Commonly known as:  PAXIL   polyethylene glycol packet Commonly known as:  MIRALAX / GLYCOLAX   simvastatin 40 MG tablet Commonly known as:  ZOCOR   STUDY - [place] - APIXABAN 5MG  OR PLACEBO    TRESIBA FLEXTOUCH 200 UNIT/ML Sopn Generic drug:  Insulin Degludec      Allergies  Allergen Reactions  . Crestor [Rosuvastatin] Other (See Comments)    Myalgias  . Indomethacin Other (See Comments)    dizziness  . Invanz [Ertapenem Sodium] Hives  . Lipitor [Atorvastatin Calcium] Other (See Comments)    Myalgias   . Reglan [Metoclopramide] Other (See Comments)    TREMORS      The results of significant diagnostics from this hospitalization (including imaging, microbiology, ancillary and laboratory) are listed below for reference.    Significant Diagnostic Studies: Ct Abdomen Pelvis W Contrast  Result Date: 07/29/2018 CLINICAL DATA:  Abdominal pain/distension EXAM: CT ABDOMEN AND PELVIS WITH CONTRAST TECHNIQUE: Multidetector CT imaging of the abdomen and pelvis was performed using the standard protocol following bolus administration of intravenous contrast. CONTRAST:  160mL OMNIPAQUE  IOHEXOL 300 MG/ML  SOLN COMPARISON:  07/26/2018 FINDINGS: Lower chest: Lung bases are clear. Hepatobiliary: Liver is within normal limits. Status post cholecystectomy. No intrahepatic ductal dilatation. Dilated common duct, measuring 13 mm, although smoothly tapering at the ampulla. Pancreas: Within normal limits. Spleen: Within normal limits. Adrenals/Urinary Tract: 17 mm right adrenal nodule. Left adrenal glands are within normal limits. Bilateral renal cysts, measuring up to 16 mm in the medial right lower kidney. No hydronephrosis. Bladder is within normal limits. Stomach/Bowel: Stomach is within normal limits. No evidence of small bowel obstruction. Prior appendectomy. Multiple dilated loops of nondependent, gas-filled colon, suggesting adynamic colonic ileus. Near complete resolution of prior pneumatosis involving the cecum/ascending colon, with only minimal residual pneumatosis (series 3/image 66). No associated wall thickening or inflammatory changes. Vascular/Lymphatic: No evidence of abdominal aortic aneurysm. Mild atherosclerotic calcifications the abdominal aorta and branch vessels. Celiac artery, SMA, and IMA remain patent. No suspicious abdominopelvic lymphadenopathy. Reproductive: Status post hysterectomy. No adnexal masses. Other: No abdominopelvic ascites. No free air. Small fat containing periumbilical hernia. Musculoskeletal: Degenerative changes of the visualized thoracolumbar spine. IMPRESSION: Near complete resolution of prior pneumatosis involving the cecum/ascending colon. No associated wall thickening or inflammatory changes. No free air. No evidence of bowel obstruction.  Prior appendectomy. Suspected adynamic colonic ileus. Additional stable ancillary findings as above. Electronically Signed   By: Julian Hy M.D.   On: 07/29/2018 21:28   Ct Abdomen Pelvis W Contrast  Result Date: 07/26/2018 CLINICAL DATA:  73 year old female with history of left-sided abdominal pain for 1 week.  EXAM: CT ABDOMEN AND PELVIS  WITH CONTRAST TECHNIQUE: Multidetector CT imaging of the abdomen and pelvis was performed using the standard protocol following bolus administration of intravenous contrast. CONTRAST:  41mL OMNIPAQUE IOHEXOL 300 MG/ML  SOLN COMPARISON:  CT the abdomen and pelvis 11/01/2015. FINDINGS: Lower chest: Atherosclerotic calcifications in the descending thoracic aorta as well as the left circumflex and right coronary arteries. Calcifications of the aortic valve and mitral annulus. Hepatobiliary: No definite cystic or solid hepatic lesions. Mild intrahepatic biliary ductal dilatation. Common bile duct measures up to 18 mm in the porta hepatis. Both of the previous findings are only slightly increased compared to prior study from 2017. No calcified stone in the common bile duct. Status post cholecystectomy. Pancreas: No pancreatic mass. No pancreatic ductal dilatation. No pancreatic or peripancreatic fluid or inflammatory changes. Spleen: Unremarkable. Adrenals/Urinary Tract: 1.8 x 1.6 cm right adrenal nodule is indeterminate, but only minimally increased in size compared to prior exam 11/01/2015, statistically likely a benign lesions such as an adenoma. Left adrenal gland is normal in appearance. Multiple low-attenuation lesions in the kidneys bilaterally, compatible with simple cysts, largest of which is exophytic in the lateral aspect of the upper pole of the right kidney measuring 4.2 cm in diameter. There is also a subcentimeter intermediate attenuation lesion in the lower pole of the left kidney, too small to characterize, but stable in size compared to the prior study from 2017, likely a mildly proteinaceous cyst. No hydroureteronephrosis. Urinary bladder is normal in appearance. Stomach/Bowel: Normal appearance of the stomach. No pathologic dilatation of small bowel or colon. Colon is mildly distended with multiple air-fluid levels (nonspecific). Notably, there appears to be pneumatosis  involving the cecum, ascending colon and hepatic flexure of the colon. No surrounding inflammatory changes are noted. The appendix is not confidently identified and may be surgically absent. Regardless, there are no inflammatory changes noted adjacent to the cecum to suggest the presence of an acute appendicitis at this time. Vascular/Lymphatic: No portal venous gas. Aortic atherosclerosis, without evidence of aneurysm or dissection in the abdominal or pelvic vasculature. Today's examination was not a CTA exam, however, the celiac axis, superior mesenteric artery, inferior mesenteric artery and their major branches all appear widely patent without hemodynamically significant stenosis. No lymphadenopathy noted in the abdomen or pelvis. Reproductive: Status post hysterectomy. Ovaries are not confidently identified may be surgically absent or atrophic. Other: Small umbilical hernia containing only omental fat. No significant volume of ascites. No pneumoperitoneum. Musculoskeletal: There are no aggressive appearing lytic or blastic lesions noted in the visualized portions of the skeleton. IMPRESSION: 1. Pneumatosis involving the cecum, ascending colon and hepatic flexure of the colon. No definite source for this pneumatosis is identified on today's examination. No associated portal venous gas. No major vascular occlusion to account for this finding. Further clinical evaluation is suggested. 2. Mild distention of the colon with multiple air-fluid levels. This is nonspecific. No findings to suggest bowel obstruction at this time. 3. Small umbilical hernia containing only omental fat, without evidence of bowel incarceration or obstruction. 4. Aortic atherosclerosis, in addition to least 2 vessel coronary artery disease. Assessment for potential risk factor modification, dietary therapy or pharmacologic therapy may be warranted, if clinically indicated. 5. Additional incidental findings, as above. Electronically Signed    By: Vinnie Langton M.D.   On: 07/26/2018 20:47   Dg Chest Port 1 View  Result Date: 08/08/2018 CLINICAL DATA:  Aspiration EXAM: PORTABLE CHEST 1 VIEW COMPARISON:  Chest x-rays dated 08/04/2018 and 07/27/2018. FINDINGS: Heart size and mediastinal  contours are stable. RIGHT IJ central line is stable in position with tip at the level of the mid SVC. Mild central pulmonary vascular congestion without overt alveolar pulmonary edema. No confluent opacity to suggest consolidating pneumonia. No pleural effusion or pneumothorax seen. Osseous structures about the chest are unremarkable. IMPRESSION: 1. Mild central pulmonary vascular congestion without overt alveolar pulmonary edema. 2. No evidence of consolidating pneumonia. Electronically Signed   By: Franki Cabot M.D.   On: 08/08/2018 05:54   Dg Chest Port 1 View  Result Date: 08/04/2018 CLINICAL DATA:  Status post central line placement EXAM: PORTABLE CHEST 1 VIEW COMPARISON:  07/27/2018 FINDINGS: Cardiac shadow is enlarged but stable. Lungs are well aerated bilaterally. No pneumothorax is noted following central line placement on the right. Catheter tip is noted at the cavoatrial junction. No infiltrate is noted. IMPRESSION: No pneumothorax following central line placement. Electronically Signed   By: Inez Catalina M.D.   On: 08/04/2018 15:30   Dg Chest Port 1 View  Result Date: 07/27/2018 CLINICAL DATA:  Fever today. EXAM: PORTABLE CHEST 1 VIEW COMPARISON:  Single-view of the chest 05/10/2018 and 04/15/2016. CT chest 11/28/2015. FINDINGS: Chronically low lung volumes again seen. There is cardiomegaly and aortic atherosclerosis. No consolidative process, pneumothorax or effusion. No acute bony abnormality. IMPRESSION: No acute disease. Cardiomegaly. Atherosclerosis. Electronically Signed   By: Inge Rise M.D.   On: 07/27/2018 13:51   Dg Abd Portable 1v  Result Date: 07/30/2018 CLINICAL DATA:  Abdominal distension. EXAM: PORTABLE ABDOMEN - 1 VIEW  COMPARISON:  Radiograph and CT scan July 29, 2018. FINDINGS: No small bowel dilatation is noted. Colonic dilatation is again noted, although the cecum appears to be decreased in caliber, although is not completely included in field-of-view. Transverse colon measures 11.5 cm in diameter. No radio-opaque calculi or other significant radiographic abnormality are seen. IMPRESSION: Colonic dilatation is noted, but may be slightly improved compared to prior exam. Electronically Signed   By: Marijo Conception, M.D.   On: 07/30/2018 10:11   Dg Abd Portable 1v  Result Date: 07/29/2018 CLINICAL DATA:  Abdominal distension EXAM: PORTABLE ABDOMEN - 1 VIEW COMPARISON:  07/28/2018; CT abdomen and pelvis-07/26/2018 FINDINGS: There is persistent marked gaseous distension of the colon with the cecum measuring approximately 12.8 cm in diameter, unchanged. Nondiagnostic evaluation for pneumoperitoneum secondary to supine positioning and exclusion of the lower thorax. Previously questioned pneumatosis demonstrated on prior abdominal CT is not definitely demonstrated on the present examination. No definite portal venous gas. No definitive abnormal intra-abdominal calcifications. Degenerative change of the lower lumbar spine. IMPRESSION: Unchanged marked gaseous distention of colon with findings again worrisome for potential distal colonic obstruction. Electronically Signed   By: Sandi Mariscal M.D.   On: 07/29/2018 06:59   Dg Abd Portable 1v  Result Date: 07/28/2018 CLINICAL DATA:  Abdominal pain.  Nausea and vomiting. EXAM: PORTABLE ABDOMEN - 1 VIEW COMPARISON:  CT abdomen pelvis 07/26/2018 FINDINGS: There is continued diffuse dilation of the colon compatible with a distal obstruction. No free air is present. Bibasilar airspace disease and effusions are noted. IMPRESSION: 1. 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 2. No evidence for free air or progressive pneumatosis. Electronically Signed   By:  San Morelle M.D.   On: 07/28/2018 07:21   Dg Be (colon) Inc Scout Abd & Delayed Images Single Cm  Result Date: 07/30/2018 CLINICAL DATA:  Stricture of the sigmoid colon. EXAM: WATER SOLUBLE CONTRAST ENEMA TECHNIQUE: Water-soluble contrast was slowly instilled per rectum. FLUOROSCOPY  TIME:  Fluoroscopy Time:  1 minutes 30 seconds COMPARISON:  Radiographs dated 07/20/2018 and CT scans dated 07/26/2018 and 07/29/2018 FINDINGS: The patient has gaseous distention of the colon to the level of the sigmoid. Contrast was slowly instilled under direct fluoroscopic visualization. There is a severe 3 cm long stricture in the mid sigmoid region. This has an apple-core configuration. Because of the severity of the stricture the study was terminated without filling the remainder of the distended colon. IMPRESSION: Water-soluble enema demonstrates a severe 3 cm long stricture of the mid sigmoid region. There is an apple-core configuration. This could represent tumor or scarring from the patient's previous diverticulitis in exactly the same area. Electronically Signed   By: Lorriane Shire M.D.   On: 07/30/2018 14:46   [place] Ekg Site Rite  Result Date: 08/04/2018 If Site Rite image not attached, placement could not be confirmed due to current cardiac rhythm.   Microbiology: Recent Results (from the past 240 hour(s))  MRSA PCR Screening     Status: None   Collection Time: 08/02/18  6:20 PM  Result Value Ref Range Status   MRSA by PCR NEGATIVE NEGATIVE Final    Comment:        The GeneXpert MRSA Assay (FDA approved for NASAL specimens only), is one component of a comprehensive MRSA colonization surveillance program. It is not intended to diagnose MRSA infection nor to guide or monitor treatment for MRSA infections. Performed at [facility], [place] [address], [place], [place] [address]      Labs: Basic Metabolic Panel: Recent Labs  Lab 08/04/18 1148  08/05/18 0425 08/05/18 2014  08/06/18 0509 08/07/18 0445 08/08/18 0534 08/08/18 1054 08/09/18 0413  NA  --    < > 144 142 143  --  140  --  138  K  --    < > 3.2* 3.7 3.6  --  2.8*  --  2.9*  CL  --    < > 118* 115* 116*  --  110  --  106  CO2  --    < > 19* 18* 20*  --  18*  --  16*  GLUCOSE  --    < > 202* 349* 402* 439* 278*  --  412*  BUN  --    < > 15 17 18   --  18  --  23  CREATININE  --    < > 1.06* 1.22* 1.12*  --  1.11*  --  1.28*  CALCIUM  --    < > 8.9 8.8* 8.8*  --  8.9  --  8.6*  MG 2.0  --  1.8  --  2.0  --   --  1.8  --   PHOS 2.7  --  4.2  --  3.5  --   --   --   --    < > = values in this interval not displayed.   Liver Function Tests: Recent Labs  Lab 08/05/18 0425  AST 10*  ALT 14  ALKPHOS 71  BILITOT 0.4  PROT 5.3*  ALBUMIN 2.1*   No results for input(s): LIPASE, AMYLASE in the last 168 hours. No results for input(s): AMMONIA in the last 168 hours. CBC: Recent Labs  Lab 08/05/18 0425 08/06/18 0509 08/07/18 0445 08/08/18 0534 08/09/18 0413  WBC 16.4* 14.6* 15.3* 16.7* 17.0*  NEUTROABS 13.6*  --   --   --   --   HGB 9.5* 9.0* 8.6* 8.7* 8.9*  HCT 30.5* 29.5* 28.2* 29.1* 28.0*  MCV 93.8 94.2 97.2 94.2 93.0  PLT 292 276 255 271 272   Cardiac Enzymes: No results for input(s): CKTOTAL, CKMB, CKMBINDEX, TROPONINI in the last 168 hours. BNP: BNP (last 3 results) No results for input(s): BNP in the last 8760 hours.  ProBNP (last 3 results) No results for input(s): PROBNP in the last 8760 hours.  CBG: Recent Labs  Lab 08/05/18 0344 08/05/18 0741 08/05/18 1146 08/07/18 1655 08/08/18 0857  GLUCAP 192* 177* 245* 384* 281*       Signed:  Hayward Rylander  [facility] 08/10/2018, 12:06 PM

## 2018-08-10 NOTE — Consult Note (Addendum)
WOC ostomy follow-up: Flexible convex pouch and belt applied yesterday; intact with good seal, no leakage.  Supplies ordered to bedside for staff nurse use. Julien Girt MSN, RN, [place], [place], [place]

## 2018-08-10 NOTE — Progress Notes (Signed)
Nutrition Brief Note  Chart reviewed. Pt now transitioning to comfort care.  No further nutrition interventions warranted at this time.  Please re-consult as needed.   Krizia Flight A. Diago Haik, RD, LDN, CDE Pager: [phone] After hours Pager: [phone]  

## 2018-08-10 NOTE — Progress Notes (Signed)
Discharged to [facility] by [facility] ambulance awake and alert. Report given to [facility] Nurse.  Belongings taken by family . Discharge paper with ambulance staff.

## 2018-08-10 NOTE — Clinical Social Work Placement (Signed)
   CLINICAL SOCIAL WORK PLACEMENT  NOTE  Date:  08/10/2018  Patient Details  Name: Sharon [name] MRN: [id number] Date of Birth: 12-15-1945  Clinical Social Work is seeking post-discharge placement for this patient at the Skilled  Nursing Facility([facility]) level of care (*CSW will initial, date and re-position this form in  chart as items are completed):  Yes   Patient/family provided with [facility] Work Department's list of facilities offering this level of care within the geographic area requested by the patient (or if unable, by the patient's family).  Yes   Patient/family informed of their freedom to choose among providers that offer the needed level of care, that participate in Medicare, Medicaid or managed care program needed by the patient, have an available bed and are willing to accept the patient.  Yes   Patient/family informed of 's ownership interest in [facility] and [facility], as well as of the fact that they are under no obligation to receive care at these facilities.  PASRR submitted to EDS on       PASRR number received on       Existing PASRR number confirmed on       FL2 transmitted to all facilities in geographic area requested by pt/family on       FL2 transmitted to all facilities within larger geographic area on       Patient informed that his/her managed care company has contracts with or will negotiate with certain facilities, including the following:            Patient/family informed of bed offers received.  Patient chooses bed at ([facility] )     Physician recommends and patient chooses bed at      Patient to be transferred [facility] ) on 08/10/18.  Patient to be transferred to facility by [facility]     Patient family notified on 08/10/18 of transfer.  Name of family member notified:  [name], Niece      PHYSICIAN Please sign DNR     Additional Comment:     _______________________________________________ Vinie Sill, [place] 08/10/2018, 1:02 PM

## 2018-08-14 DEATH — deceased

## 2018-08-17 ENCOUNTER — Telehealth: Payer: Self-pay | Admitting: *Deleted

## 2018-08-17 NOTE — Telephone Encounter (Signed)
Noted that patient's LINQ monitor has been disconnected as of 07/26/18. Per hospital d/c note from 08/10/18, patient d/c to [facility]. Called hospice, spoke with receptionist. Per receptionist, patient expired on 2018/09/06.  Unenrolled from [place], no return kit ordered.

## 2018-08-25 ENCOUNTER — Telehealth: Payer: Self-pay | Admitting: Neurology

## 2018-08-25 NOTE — Telephone Encounter (Signed)
Patient's sister called and informed our office that Sharon [name] passed on 08-22-18. Cancelled appt. W/ Dr. Leonie Man on 09/07/18.

## 2018-08-26 NOTE — Telephone Encounter (Signed)
Thanks for letting me know!

## 2018-09-07 ENCOUNTER — Ambulatory Visit: Payer: Medicare Other | Admitting: Neurology
